# Patient Record
Sex: Female | Born: 1940 | Race: White | Hispanic: No | Smoking: Former smoker
Health system: Southern US, Community
[De-identification: ages and names within clinical notes are randomized; demographics above are authoritative.]

## PROBLEM LIST (undated history)

## (undated) DIAGNOSIS — R809 Proteinuria, unspecified: Secondary | ICD-10-CM

## (undated) DIAGNOSIS — M1990 Unspecified osteoarthritis, unspecified site: Secondary | ICD-10-CM

## (undated) DIAGNOSIS — K8020 Calculus of gallbladder without cholecystitis without obstruction: Secondary | ICD-10-CM

## (undated) DIAGNOSIS — I10 Essential (primary) hypertension: Secondary | ICD-10-CM

## (undated) DIAGNOSIS — R7989 Other specified abnormal findings of blood chemistry: Secondary | ICD-10-CM

## (undated) DIAGNOSIS — R42 Dizziness and giddiness: Secondary | ICD-10-CM

## (undated) DIAGNOSIS — E7800 Pure hypercholesterolemia, unspecified: Secondary | ICD-10-CM

## (undated) DIAGNOSIS — H9190 Unspecified hearing loss, unspecified ear: Secondary | ICD-10-CM

## (undated) DIAGNOSIS — R0600 Dyspnea, unspecified: Secondary | ICD-10-CM

## (undated) DIAGNOSIS — E039 Hypothyroidism, unspecified: Secondary | ICD-10-CM

## (undated) DIAGNOSIS — G43909 Migraine, unspecified, not intractable, without status migrainosus: Secondary | ICD-10-CM

## (undated) DIAGNOSIS — R51 Headache: Secondary | ICD-10-CM

## (undated) DIAGNOSIS — E079 Disorder of thyroid, unspecified: Secondary | ICD-10-CM

## (undated) HISTORY — DX: Migraine, unspecified, not intractable, without status migrainosus: G43.909

## (undated) HISTORY — PX: REDUCTION MAMMAPLASTY: SUR839

## (undated) HISTORY — DX: Unspecified hearing loss, unspecified ear: H91.90

## (undated) HISTORY — DX: Dizziness and giddiness: R42

## (undated) HISTORY — DX: Proteinuria, unspecified: R80.9

## (undated) HISTORY — PX: REPLACEMENT TOTAL KNEE: SUR1224

## (undated) HISTORY — DX: Calculus of gallbladder without cholecystitis without obstruction: K80.20

## (undated) HISTORY — DX: Unspecified osteoarthritis, unspecified site: M19.90

## (undated) HISTORY — PX: ABDOMINAL HYSTERECTOMY: SHX81

## (undated) HISTORY — DX: Hypothyroidism, unspecified: E03.9

## (undated) HISTORY — PX: INCISION AND DRAINAGE BREAST ABSCESS: SUR672

## (undated) HISTORY — PX: CATARACT EXTRACTION, BILATERAL: SHX1313

## (undated) HISTORY — PX: TONSILLECTOMY AND ADENOIDECTOMY: SHX28

## (undated) HISTORY — PX: BREAST REDUCTION SURGERY: SHX8

## (undated) HISTORY — DX: Headache: R51

---

## 2009-04-28 DIAGNOSIS — J9801 Acute bronchospasm: Secondary | ICD-10-CM | POA: Insufficient documentation

## 2009-04-28 DIAGNOSIS — M159 Polyosteoarthritis, unspecified: Secondary | ICD-10-CM | POA: Insufficient documentation

## 2009-04-28 DIAGNOSIS — R1314 Dysphagia, pharyngoesophageal phase: Secondary | ICD-10-CM | POA: Insufficient documentation

## 2010-08-30 DIAGNOSIS — K209 Esophagitis, unspecified without bleeding: Secondary | ICD-10-CM | POA: Insufficient documentation

## 2010-09-15 DIAGNOSIS — M109 Gout, unspecified: Secondary | ICD-10-CM | POA: Insufficient documentation

## 2017-05-28 DIAGNOSIS — R739 Hyperglycemia, unspecified: Secondary | ICD-10-CM | POA: Insufficient documentation

## 2017-05-28 DIAGNOSIS — J302 Other seasonal allergic rhinitis: Secondary | ICD-10-CM | POA: Insufficient documentation

## 2017-11-18 LAB — COLOGUARD

## 2018-07-04 ENCOUNTER — Ambulatory Visit (HOSPITAL_COMMUNITY)
Admission: EM | Admit: 2018-07-04 | Discharge: 2018-07-04 | Disposition: A | Discharge status: Home / Self Care | Payer: Medicare Other | Attending: Family Medicine | Admitting: Family Medicine

## 2018-07-04 ENCOUNTER — Encounter (HOSPITAL_COMMUNITY): Payer: Self-pay

## 2018-07-04 DIAGNOSIS — R42 Dizziness and giddiness: Secondary | ICD-10-CM

## 2018-07-04 DIAGNOSIS — I10 Essential (primary) hypertension: Secondary | ICD-10-CM

## 2018-07-04 DIAGNOSIS — E785 Hyperlipidemia, unspecified: Secondary | ICD-10-CM

## 2018-07-04 DIAGNOSIS — M069 Rheumatoid arthritis, unspecified: Secondary | ICD-10-CM

## 2018-07-04 HISTORY — DX: Pure hypercholesterolemia, unspecified: E78.00

## 2018-07-04 HISTORY — DX: Essential (primary) hypertension: I10

## 2018-07-04 HISTORY — DX: Disorder of thyroid, unspecified: E07.9

## 2018-07-04 HISTORY — DX: Rheumatoid arthritis, unspecified: M06.9

## 2018-07-04 MED ORDER — MECLIZINE HCL 12.5 MG PO TABS: 12.5000 mg | ORAL_TABLET | Freq: Three times a day (TID) | ORAL | 0 refills | Status: DC | PRN

## 2018-07-04 MED ORDER — TRAMADOL HCL 50 MG PO TABS: 50.0000 mg | ORAL_TABLET | Freq: Four times a day (QID) | ORAL | 0 refills | Status: DC | PRN

## 2018-07-04 NOTE — ED Triage Notes (Signed)
Pt presents with complaints of dizziness and left ear pain and itching x 3 weeks. Pt is alert, oriented and ambulatory.

## 2018-07-04 NOTE — ED Provider Notes (Addendum)
[facility]    CSN: [id number] Arrival date & time: 07/04/18  9518     History   Chief Complaint Chief Complaint  Patient presents with  . Dizziness  . Otalgia    HPI Alexis Shelton is a 77 y.o. female.   HPI  Pleasant 77 year old woman who was recently moved to the [place] area from [place].  She has been having dizziness and tinnitus and a pressure sensation in her left ear for about a month.  No trauma or injury.  No sinus symptoms or allergies.  She states actually is getting a bit better.  No falls.  No loss of hearing.  No nausea or vomiting.  Patient has rheumatoid arthritis.  She has enough of her blood pressure and cholesterol medicines, but is running low on tramadol.  She has been able to get in with a primary care doctor.  She takes it once  or twice a day.     Past Medical History:  Diagnosis Date  . Hypercholesteremia   . Hypertension   . Thyroid disease     Patient Active Problem List   Diagnosis Date Noted  . Rheumatoid arthritis (HCC) 07/04/2018  . Essential hypertension 07/04/2018  . Hyperlipidemia 07/04/2018    Past Surgical History:  Procedure Laterality Date  . KNEE REPAIR EXTENSOR MECHANISM      OB History   None      Home Medications    Prior to Admission medications   Medication Sig Start Date End Date Taking? Authorizing Provider  allopurinol (ZYLOPRIM) 100 MG tablet Take 100 mg by mouth daily.   Yes [provider]  amLODipine (NORVASC) 10 MG tablet Take 10 mg by mouth daily.   Yes [provider]  hydrochlorothiazide (HYDRODIURIL) 25 MG tablet Take 25 mg by mouth daily.   Yes [provider]  oxybutynin (DITROPAN) 5 MG tablet Take 5 mg by mouth 3 (three) times daily.   Yes [provider]  pravastatin (PRAVACHOL) 10 MG tablet Take 10 mg by mouth daily.   Yes [provider]  meclizine (ANTIVERT) 12.5 MG tablet Take 1 tablet (12.5 mg total) by mouth 3 (three) times daily  as needed for dizziness. 07/04/18   Eustace Moore, MD  traMADol (ULTRAM) 50 MG tablet Take 1 tablet (50 mg total) by mouth every 6 (six) hours as needed. 07/04/18   Eustace Moore, MD    Family History Family History  Problem Relation Age of Onset  . Stroke Mother   . Hyperlipidemia Mother   . Heart disease Father     Social History Social History   Tobacco Use  . Smoking status: Never Smoker  . Smokeless tobacco: Never Used  Substance Use Topics  . Alcohol use: Never    Frequency: Never  . Drug use: Never     Allergies   Patient has no known allergies.   Review of Systems Review of Systems  Constitutional: Negative for chills, fever and unexpected weight change.  HENT: Negative for ear pain and sore throat.   Eyes: Negative for pain and visual disturbance.  Respiratory: Negative for cough and shortness of breath.   Cardiovascular: Negative for chest pain and palpitations.  Gastrointestinal: Negative for abdominal pain, nausea and vomiting.  Genitourinary: Negative for dysuria and hematuria.  Musculoskeletal: Positive for arthralgias. Negative for back pain.       Chronic  Skin: Negative for color change and rash.  Neurological: Positive for dizziness. Negative for seizures,  syncope, light-headedness and headaches.  All other systems reviewed and are negative.    Physical Exam Triage Vital Signs ED Triage Vitals  Enc Vitals Group     BP 07/04/18 0906 (!) 162/66     Pulse Rate 07/04/18 0906 62     Resp 07/04/18 0906 18     Temp 07/04/18 0906 98.5 F (36.9 C)     Temp Source 07/04/18 0906 Oral     SpO2 07/04/18 0906 97 %     Weight --      Height --      Head Circumference --      Peak Flow --      Pain Score 07/04/18 0909 2     Pain Loc --      Pain Edu? --      Excl. in GC? --    Orthostatic VS for the past 24 hrs:  BP- Lying Pulse- Lying BP- Sitting Pulse- Sitting  07/04/18 0958 164/75 64 172/68 64    Updated Vital Signs BP (!) 162/66  (BP Location: Left Arm)   Pulse 62   Temp 98.5 F (36.9 C) (Oral)   Resp 18   SpO2 97%       Physical Exam  Constitutional: She appears well-developed and well-nourished. No distress.  HENT:  Head: Normocephalic and atraumatic.  Right Ear: External ear normal.  Left Ear: External ear normal.  Mouth/Throat: Oropharynx is clear and moist.  TMs are normal  Eyes: Pupils are equal, round, and reactive to light. Conjunctivae and EOM are normal.  Disks flat.  No nystagmus  Neck: Normal range of motion. No thyromegaly present.  Cardiovascular: Normal rate.  Pulmonary/Chest: Effort normal. No respiratory distress.  Abdominal: Soft. She exhibits no distension.  Musculoskeletal: Normal range of motion. She exhibits no edema.  Arthritic changes and deformity obvious at the IP joints of both hands  Lymphadenopathy:    She has no cervical adenopathy.  Neurological: She is alert.  Skin: Skin is warm and dry.  Psychiatric: She has a normal mood and affect. Her behavior is normal.     UC Treatments / Results  Labs (all labs ordered are listed, but only abnormal results are displayed) Labs Reviewed - No data to display  EKG None  Radiology No results found.  Procedures Procedures (including critical care time)  Medications Ordered in UC Medications - No data to display  Initial Impression / Assessment and Plan / UC Course  I have reviewed the triage vital signs and the nursing notes.  Pertinent labs & imaging results that were available during my care of the patient were reviewed by me and considered in my medical decision making (see chart for details).      Final Clinical Impressions(s) / UC Diagnoses   Final diagnoses:  Vertigo     Discharge Instructions     Take meclizine as needed for vertigo Take your blood pressure medications at the same time every day Drink plenty of water I have refilled 30 days of tramadol I have placed PCP referral Return as  needed    ED Prescriptions    Medication Sig Dispense Auth. Provider   traMADol (ULTRAM) 50 MG tablet Take 1 tablet (50 mg total) by mouth every 6 (six) hours as needed. 90 tablet Eustace Moore, MD   meclizine (ANTIVERT) 12.5 MG tablet Take 1 tablet (12.5 mg total) by mouth 3 (three) times daily as needed for dizziness. 30 tablet Eustace Moore, MD  Controlled Substance Prescriptions [place] Controlled Substance Registry consulted? Yes, I have consulted the [place] Controlled Substances Registry for this patient, and feel the risk/benefit ratio today is favorable for proceeding with this prescription for a controlled substance.   Eustace Moore, MD 07/04/18 1047    Eustace Moore, MD 07/04/18 1048

## 2018-07-04 NOTE — Discharge Instructions (Signed)
Take meclizine as needed for vertigo Take your blood pressure medications at the same time every day Drink plenty of water I have refilled 30 days of tramadol I have placed PCP referral Return as needed

## 2018-07-05 ENCOUNTER — Other Ambulatory Visit: Payer: Self-pay | Admitting: Family Medicine

## 2018-07-22 ENCOUNTER — Other Ambulatory Visit: Payer: Self-pay | Admitting: Otolaryngology

## 2018-07-22 ENCOUNTER — Other Ambulatory Visit (HOSPITAL_COMMUNITY): Payer: Self-pay | Admitting: Otolaryngology

## 2018-07-22 DIAGNOSIS — H903 Sensorineural hearing loss, bilateral: Secondary | ICD-10-CM

## 2018-07-30 ENCOUNTER — Ambulatory Visit (HOSPITAL_COMMUNITY): Payer: Medicare Other

## 2018-08-02 ENCOUNTER — Ambulatory Visit (HOSPITAL_COMMUNITY)
Admission: RE | Admit: 2018-08-02 | Discharge: 2018-08-02 | Disposition: A | Discharge status: Home / Self Care | Payer: Medicare Other | Source: Ambulatory Visit | Attending: Otolaryngology | Admitting: Otolaryngology

## 2018-08-02 DIAGNOSIS — R9082 White matter disease, unspecified: Secondary | ICD-10-CM | POA: Insufficient documentation

## 2018-08-02 DIAGNOSIS — H903 Sensorineural hearing loss, bilateral: Secondary | ICD-10-CM | POA: Diagnosis present

## 2018-08-02 LAB — CREATININE, SERUM
Creatinine, Ser: 1.47 mg/dL — ABNORMAL HIGH (ref 0.44–1.00)
GFR calc Af Amer: 38 mL/min — ABNORMAL LOW (ref >60)
GFR calc non Af Amer: 33 mL/min — ABNORMAL LOW (ref >60)

## 2018-08-02 MED ORDER — GADOBENATE DIMEGLUMINE 529 MG/ML IV SOLN
20.0000 mL | Freq: Once | INTRAVENOUS | Status: AC
  Administered 2018-08-02: 10 mL via INTRAVENOUS

## 2018-08-12 ENCOUNTER — Ambulatory Visit (INDEPENDENT_AMBULATORY_CARE_PROVIDER_SITE_OTHER): Payer: Medicare Other | Admitting: Family Medicine

## 2018-08-12 ENCOUNTER — Encounter (INDEPENDENT_AMBULATORY_CARE_PROVIDER_SITE_OTHER): Payer: Self-pay | Admitting: Family Medicine

## 2018-08-12 VITALS — BP 118/52 | HR 62 | Temp 97.4°F

## 2018-08-12 DIAGNOSIS — J45909 Unspecified asthma, uncomplicated: Secondary | ICD-10-CM | POA: Insufficient documentation

## 2018-08-12 DIAGNOSIS — M1009 Idiopathic gout, multiple sites: Secondary | ICD-10-CM | POA: Diagnosis not present

## 2018-08-12 DIAGNOSIS — J4520 Mild intermittent asthma, uncomplicated: Secondary | ICD-10-CM

## 2018-08-12 DIAGNOSIS — I10 Essential (primary) hypertension: Secondary | ICD-10-CM

## 2018-08-12 DIAGNOSIS — M069 Rheumatoid arthritis, unspecified: Secondary | ICD-10-CM | POA: Diagnosis not present

## 2018-08-12 DIAGNOSIS — R413 Other amnesia: Secondary | ICD-10-CM

## 2018-08-12 DIAGNOSIS — E785 Hyperlipidemia, unspecified: Secondary | ICD-10-CM | POA: Diagnosis not present

## 2018-08-12 DIAGNOSIS — N183 Chronic kidney disease, stage 3 unspecified: Secondary | ICD-10-CM

## 2018-08-12 DIAGNOSIS — R42 Dizziness and giddiness: Secondary | ICD-10-CM

## 2018-08-12 DIAGNOSIS — E039 Hypothyroidism, unspecified: Secondary | ICD-10-CM

## 2018-08-12 HISTORY — DX: Idiopathic gout, multiple sites: M10.09

## 2018-08-12 MED ORDER — MONTELUKAST SODIUM 10 MG PO TABS: 10.0000 mg | ORAL_TABLET | Freq: Every day | ORAL | 3 refills | Status: DC

## 2018-08-12 MED ORDER — ATORVASTATIN CALCIUM 10 MG PO TABS: 10.0000 mg | ORAL_TABLET | Freq: Every day | ORAL | 3 refills | Status: DC

## 2018-08-12 NOTE — Progress Notes (Signed)
Office Visit Note   Patient: Alexis Shelton           Date of Birth: [birth year]           MRN: [id number] Visit Date: 08/12/2018 Requested by: No referring provider defined for this encounter. PCP: Lavada Mesi, MD  Subjective: Chief Complaint  Patient presents with  . establish primary care - moved from [place]    HPI: She recently moved here from [place].  Unfortunately her husband passed away last winter.  She moved here to be closer to her son and grandson who is now 16 years old.  Recently she went to the ER with vertigo.  She has had a dizzy feeling for about a month.  She also notes that her memory is not as good as it used to be, all in the past month.  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  She is concerned because her mother was incapacitated by a stroke in her 2s and eventually died from complications.  She has a history of rheumatoid arthritis which has been controlled without medication.  She has made some good dietary changes which seem to have helped.  Hypertension has been controlled with medication.  No side effects.  She tolerates pravastatin for hyperlipidemia but she thinks she did better with Lipitor and would like to go back to that.  She has a history of mild intermittent asthma and is currently having some wheezing.  Patient has hypothyroidism which is well controlled with medication.  She has a history of chronic kidney disease monitored periodically by nephrology.                ROS: Other systems were reviewed and are negative.  Objective: Vital Signs: BP (!) 118/52 (BP Location: Left Arm, Patient Position: Sitting, Cuff Size: Normal)   Pulse 62   Temp (!) 97.4 F (36.3 C)   Physical Exam:  HEENT:  [place]/AT, PERRLA, EOM Full, no nystagmus.  Funduscopic examination within normal limits.  No conjunctival erythema.  Tympanic membranes are pearly gray with normal landmarks.  External ear  canals are normal.  Nasal passages are clear.  Oropharynx is clear.  No significant lymphadenopathy.  No thyromegaly or nodules.  2+ carotid pulses without bruits. CV: Regular rate and rhythm without murmurs, rubs, or gallops.  No peripheral edema.  2+ radial and posterior tibial pulses. Lungs: Overall good air movement but she has diffuse expiratory wheezing, no areas of consolidation. EXT: 2+ upper and lower DTRs.  She has nodularity of her fingers.  No active synovitis.    Imaging: None today  Assessment & Plan: 1.  Dizziness with memory change, abnormal brain MRI -Per patient request we will request neurology consultation.  We will optimize blood pressure control.  2.  Hypertension, well controlled today.  3.  Hyperlipidemia, tolerating statin.  Switch to Lipitor.  4.  History of gout, asymptomatic.  5.  Hypothyroidism, clinically euthyroid  6.  Chronic kidney disease -Recheck labs in about 3 to 4 months.  7.  Asthma with acute exacerbation -Albuterol as needed.  Start Singulair.   Follow-Up Instructions: Return in about 6 months (around 02/12/2019).     Procedures: None today.   PMFS History: Patient Active Problem List   Diagnosis Date Noted  . 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  . Asthma 08/12/2018  . Hypothyroidism 08/12/2018  . Rheumatoid arthritis (HCC) 07/04/2018  . Essential hypertension 07/04/2018  .  Hyperlipidemia 07/04/2018   Past Medical History:  Diagnosis Date  . Hypercholesteremia   . Hypertension   . Thyroid disease     Family History  Problem Relation Age of Onset  . Stroke Mother   . Hyperlipidemia Mother   . Heart disease Father     Past Surgical History:  Procedure Laterality Date  . KNEE REPAIR EXTENSOR MECHANISM     Social History   Occupational History  . Not on file  Tobacco Use  . Smoking status: Never Smoker  . Smokeless tobacco: Never Used  Substance and Sexual Activity  . Alcohol use: Never    Frequency:  Never  . Drug use: Never  . Sexual activity: Not on file

## 2018-08-14 ENCOUNTER — Other Ambulatory Visit (INDEPENDENT_AMBULATORY_CARE_PROVIDER_SITE_OTHER): Payer: Self-pay | Admitting: Family Medicine

## 2018-08-14 ENCOUNTER — Telehealth (INDEPENDENT_AMBULATORY_CARE_PROVIDER_SITE_OTHER): Payer: Self-pay | Admitting: Family Medicine

## 2018-08-14 NOTE — Telephone Encounter (Signed)
Patient left a message requesting several RX refills.  I contacted the patient to find out which ones she needed refilled.  She could only remember the allopurinol and the amlodipine.  She asked if we could call [facility] on [address] to find out the rest of the medications that she could not remember that need to be refilled.  CB#[phone] or [phone].  Thank you.

## 2018-08-14 NOTE — Telephone Encounter (Signed)
Advised the patient to call her pharmacy, letting them know she needs refills.  If she has no refills left on the medications, the pharmacy will send the information on the requested medications to [place] for [place] to refill.  She said she understood and will call her pharmacy.

## 2018-08-15 ENCOUNTER — Telehealth (INDEPENDENT_AMBULATORY_CARE_PROVIDER_SITE_OTHER): Payer: Self-pay

## 2018-08-15 NOTE — Telephone Encounter (Signed)
Patient called stating that since taking the Singulair, she has not been feeling very well.  Stated she has been feeling run down and very tired.  Would like a CB to discuss.  CB# is [phone].  Please advise.  Thank You.

## 2018-08-15 NOTE — Telephone Encounter (Signed)
Sometimes cutting dosage to 1/2 tablet daily will solve that problem.  If it persists, we'll stop it and try something else.

## 2018-08-15 NOTE — Telephone Encounter (Signed)
Advised patient

## 2018-08-15 NOTE — Telephone Encounter (Signed)
The patient has started taking the Singulair 10 mg, 1 at bedtime for 5 days now.  She says she wakes during the night feeling "uneasy" and nauseated.  It is helping her breathing, but she just feels lethargic.  Please advise.

## 2018-09-02 ENCOUNTER — Other Ambulatory Visit (INDEPENDENT_AMBULATORY_CARE_PROVIDER_SITE_OTHER): Payer: Self-pay | Admitting: Family Medicine

## 2018-09-02 MED ORDER — LOSARTAN POTASSIUM 100 MG PO TABS: 100.0000 mg | ORAL_TABLET | Freq: Every day | ORAL | 1 refills | Status: DC

## 2018-09-19 ENCOUNTER — Telehealth (INDEPENDENT_AMBULATORY_CARE_PROVIDER_SITE_OTHER): Payer: Self-pay | Admitting: Family Medicine

## 2018-09-19 NOTE — Telephone Encounter (Signed)
Patient called would like to see a Rheumatologist,Alexis Shelton is currently not feeling well. Please call patient to discuss patient care.

## 2018-09-20 ENCOUNTER — Other Ambulatory Visit (INDEPENDENT_AMBULATORY_CARE_PROVIDER_SITE_OTHER): Payer: Self-pay | Admitting: Family Medicine

## 2018-09-20 DIAGNOSIS — M069 Rheumatoid arthritis, unspecified: Secondary | ICD-10-CM

## 2018-09-20 NOTE — Telephone Encounter (Signed)
Referral orders have been placed. 

## 2018-10-13 ENCOUNTER — Other Ambulatory Visit (INDEPENDENT_AMBULATORY_CARE_PROVIDER_SITE_OTHER): Payer: Self-pay | Admitting: Family Medicine

## 2018-10-13 ENCOUNTER — Encounter: Payer: Self-pay | Admitting: Neurology

## 2018-10-13 ENCOUNTER — Ambulatory Visit (INDEPENDENT_AMBULATORY_CARE_PROVIDER_SITE_OTHER): Payer: Medicare Other | Admitting: Neurology

## 2018-10-13 VITALS — BP 138/73 | HR 61 | Ht 66.0 in | Wt 179.5 lb

## 2018-10-13 DIAGNOSIS — R269 Unspecified abnormalities of gait and mobility: Secondary | ICD-10-CM | POA: Diagnosis not present

## 2018-10-13 DIAGNOSIS — Z8673 Personal history of transient ischemic attack (TIA), and cerebral infarction without residual deficits: Secondary | ICD-10-CM

## 2018-10-13 HISTORY — DX: Personal history of transient ischemic attack (TIA), and cerebral infarction without residual deficits: Z86.73

## 2018-10-13 NOTE — Progress Notes (Signed)
PATIENT: Alexis Shelton DOB: Jan 20, 1941  Chief Complaint  Patient presents with  . Dizziness    Orthostatic Vitals: Lying: 138/73, 61, Sitting: 160/86, 62, Standing: 134/66, 67, Standing x 3 minutes: 148/70, 66.  Reports intermittent dizziness worsening since May 2019.  Symptoms not always related to positional changes.  . Headache    Reports 1-2 headaches per month with an occasional migraine.  She typically does not medicate her pain but has Tramadol, if needed.  She is unable to take NSAIDS due to her kidney function.   Marland Kitchen PCP    Hilts, Casimiro Needle, MD     HISTORICAL  Alexis Shelton is a 77 year old female, seen in request by her primary care physician Hilts, Casimiro Needle, for evaluation of imbalance, initial evaluation was on October 13, 2018.  I have reviewed and summarized the referring note from the referring physician.  She had a past medical history of hypertension bilateral knee replacement, cataract surgery, hyperlipidemia, hypothyroidism on supplement,  She had a history of chronic migraine in the past, usually triggered by stress, especially the period of time after extreme stress, she would get lateralized pounding headaches, often preceded by visual aura, she only has a few times each year, occasionally woke up with her typical migraine, she took tramadol as needed for pain,  She also had a history of rheumatoid arthritis, bilateral knee replacement, this was done at [place], left side in June 2018, right knee was February 2019, she recovered well post surgically, since May 2019, she noticed gradual onset of unsteady gait, first noticed that when she got up in the middle of the night using bathroom, she has to hold walls to steady herself.  She only noticed unsteady sensation when she bear weight, walking, denies dizziness in sitting down or lying down position, she denies vertigo,  20 years ago, she woke up from general anesthesia for her hysterectomy, she had a sudden onset left  hearing loss, has been persistent that way.  She recently moved from [place] to [place], during the past 10 years, she traveled extensively in cruise trip, she complains of seasickness sometimes, she also drinks up to 8 ounces of wine frequently with her friend.  We personally reviewed MRI of the brain August 2019, remote cerebellar infarction, mild periventricular white matter disease,  There is no orthostatic blood pressure demonstrated today, she does complains of mild bilateral toes numbness. Lying: 138/73, 61, Sitting: 160/86, 62, Standing: 134/66, 67, Standing x 3 minutes: 148/70, 66.  Reports intermittent dizziness worsening since May 2019.  Symptoms not always related to positional changes.   REVIEW OF SYSTEMS: Full 14 system review of systems performed and notable only for sleepiness, skin sensitivity, wheezing, hearing loss, ringing the ears, spinning sensation All other review of systems were negative.  ALLERGIES: Allergies  Allergen Reactions  . Other     Wood - burning fireplaces    HOME MEDICATIONS: Current Outpatient Medications  Medication Sig Dispense Refill  . ALBUTEROL SULFATE HFA IN Inhale into the lungs as needed.    Marland Kitchen allopurinol (ZYLOPRIM) 100 MG tablet TAKE 1 TABLET BY MOUTH EVERY DAY 90 tablet 0  . amLODipine (NORVASC) 10 MG tablet Take 10 mg by mouth daily.    Marland Kitchen atorvastatin (LIPITOR) 10 MG tablet Take 1 tablet (10 mg total) by mouth daily. 90 tablet 3  . hydrochlorothiazide (HYDRODIURIL) 25 MG tablet Take 25 mg by mouth daily.    Marland Kitchen levothyroxine (SYNTHROID, LEVOTHROID) 100 MCG tablet Take 100  mcg by mouth daily before breakfast.    . losartan (COZAAR) 100 MG tablet Take 1 tablet (100 mg total) by mouth daily. 90 tablet 1  . montelukast (SINGULAIR) 10 MG tablet Take 1 tablet (10 mg total) by mouth at bedtime. 90 tablet 3  . omeprazole (PRILOSEC) 20 MG capsule TAKE ONE CAPSULE BY MOUTH TWICE A DAY 90 capsule 0  . traMADol (ULTRAM) 50 MG tablet Take 1  tablet (50 mg total) by mouth every 6 (six) hours as needed. 90 tablet 0   No current facility-administered medications for this visit.     PAST MEDICAL HISTORY: Past Medical History:  Diagnosis Date  . Dizziness   . Headache   . Hearing loss   . Hypercholesteremia   . Hypertension   . Thyroid disease     PAST SURGICAL HISTORY: Past Surgical History:  Procedure Laterality Date  . ABDOMINAL HYSTERECTOMY    . CATARACT EXTRACTION, BILATERAL    . KNEE REPAIR EXTENSOR MECHANISM    . REPLACEMENT TOTAL KNEE Bilateral     FAMILY HISTORY: Family History  Problem Relation Age of Onset  . Stroke Mother   . Hyperlipidemia Mother   . Heart disease Father     SOCIAL HISTORY: Social History   Socioeconomic History  . Marital status: Single    Spouse name: Not on file  . Number of children: 1  . Years of education: MD  . Highest education level: Not on file  Occupational History  . Occupation: Retired - Sales promotion account executive  Social Needs  . Financial resource strain: Not on file  . Food insecurity:    Worry: Not on file    Inability: Not on file  . Transportation needs:    Medical: Not on file    Non-medical: Not on file  Tobacco Use  . Smoking status: Former Smoker    Last attempt to quit: 1970    Years since quitting: 49.8  . Smokeless tobacco: Never Used  Substance and Sexual Activity  . Alcohol use: Not Currently    Frequency: Never  . Drug use: Never  . Sexual activity: Not on file  Lifestyle  . Physical activity:    Days per week: Not on file    Minutes per session: Not on file  . Stress: Not on file  Relationships  . Social connections:    Talks on phone: Not on file    Gets together: Not on file    Attends religious service: Not on file    Active member of club or organization: Not on file    Attends meetings of clubs or organizations: Not on file    Relationship status: Not on file  . Intimate partner violence:    Fear of current or ex partner: Not  on file    Emotionally abused: Not on file    Physically abused: Not on file    Forced sexual activity: Not on file  Other Topics Concern  . Not on file  Social History Narrative   Lives alone.   Right-handed.   1 cup coffee per day.     PHYSICAL EXAM   Vitals:   10/13/18 1358  BP: 138/73  Pulse: 61  Weight: 179 lb 8 oz (81.4 kg)  Height: 5\' 6"  (1.676 m)    Not recorded      Body mass index is 28.97 kg/m.  PHYSICAL EXAMNIATION:  Gen: NAD, conversant, well nourised, obese, well groomed  Cardiovascular: Regular rate rhythm, no peripheral edema, warm, nontender. Eyes: Conjunctivae clear without exudates or hemorrhage Neck: Supple, no carotid bruits. Pulmonary: Clear to auscultation bilaterally   NEUROLOGICAL EXAM:  MENTAL STATUS: Speech:    Speech is normal; fluent and spontaneous with normal comprehension.  Cognition:     Orientation to time, place and person     Normal recent and remote memory     Normal Attention span and concentration     Normal Language, naming, repeating,spontaneous speech     Fund of knowledge   CRANIAL NERVES: CN II: Visual fields are full to confrontation. Fundoscopic exam is normal with sharp discs and no vascular changes. Pupils are round equal and briskly reactive to light. CN III, IV, VI: extraocular movement are normal. No ptosis. CN V: Facial sensation is intact to pinprick in all 3 divisions bilaterally. Corneal responses are intact.  CN VII: Face is symmetric with normal eye closure and smile. CN VIII: Decreased to finger rubbing on the left side CN IX, X: Palate elevates symmetrically. Phonation is normal. CN XI: Head turning and shoulder shrug are intact CN XII: Tongue is midline with normal movements and no atrophy.  MOTOR: There is no pronator drift of out-stretched arms. Muscle bulk and tone are normal. Muscle strength is normal.  REFLEXES: Reflexes are 2+ and symmetric at the biceps, triceps, absent  at knees, and trace at ankles. Plantar responses are flexor.  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  Romberg is absent.   DIAGNOSTIC DATA (LABS, IMAGING, TESTING) - I reviewed patient records, labs, notes, testing and imaging myself where available.   ASSESSMENT AND PLAN  Alexis Shelton is a 77 y.o. female   Unsteady gait  Evidence of truncal ataxia, mild dysmetria of bilateral lower extremity,  Differentiation diagnosis include brainstem/cerebellum degeneration, she did have evidence of small cerebellum stroke, reported history of moderate alcohol use in the past, with superimposed mild axonal peripheral neuropathy  Laboratory evaluations potential cause of peripheral neuropathy  EMG nerve conduction study  Referral to physical therapy  History of stroke  Complete evaluation with echocardiogram  Ultrasound of carotid artery  Increase water intake  Aspirin 81 mg daily    Levert Feinstein, M.D. Ph.D.  [facility] [address] [place], [place] [address] Ph: [phone] Fax: [phone]  CC:  Lavada Mesi, MD

## 2018-10-15 ENCOUNTER — Other Ambulatory Visit (INDEPENDENT_AMBULATORY_CARE_PROVIDER_SITE_OTHER): Payer: Self-pay | Admitting: Family Medicine

## 2018-10-15 ENCOUNTER — Telehealth (INDEPENDENT_AMBULATORY_CARE_PROVIDER_SITE_OTHER): Payer: Self-pay

## 2018-10-15 LAB — CBC WITH DIFFERENTIAL/PLATELET
Basophils Absolute: 0 10*3/uL (ref 0.0–0.2)
Basos: 1 %
EOS (ABSOLUTE): 0.3 10*3/uL (ref 0.0–0.4)
EOS: 5 %
HEMATOCRIT: 43.5 % (ref 34.0–46.6)
HEMOGLOBIN: 14 g/dL (ref 11.1–15.9)
Immature Grans (Abs): 0.1 10*3/uL (ref 0.0–0.1)
Immature Granulocytes: 1 %
Lymphocytes Absolute: 2 10*3/uL (ref 0.7–3.1)
Lymphs: 28 %
MCH: 29.7 pg (ref 26.6–33.0)
MCHC: 32.2 g/dL (ref 31.5–35.7)
MCV: 92 fL (ref 79–97)
MONOCYTES: 9 %
Monocytes Absolute: 0.6 10*3/uL (ref 0.1–0.9)
NEUTROS ABS: 3.9 10*3/uL (ref 1.4–7.0)
Neutrophils: 56 %
Platelets: 270 10*3/uL (ref 150–450)
RBC: 4.72 x10E6/uL (ref 3.77–5.28)
RDW: 14.1 % (ref 12.3–15.4)
WBC: 6.9 10*3/uL (ref 3.4–10.8)

## 2018-10-15 LAB — PROTEIN ELECTROPHORESIS
A/G RATIO SPE: 1 (ref 0.7–1.7)
Albumin ELP: 3.8 g/dL (ref 2.9–4.4)
Alpha 1: 0.2 g/dL (ref 0.0–0.4)
Alpha 2: 1.1 g/dL — ABNORMAL HIGH (ref 0.4–1.0)
BETA: 1.2 g/dL (ref 0.7–1.3)
Gamma Globulin: 1.5 g/dL (ref 0.4–1.8)
Globulin, Total: 4 g/dL — ABNORMAL HIGH (ref 2.2–3.9)
Total Protein: 7.8 g/dL (ref 6.0–8.5)

## 2018-10-15 LAB — SEDIMENTATION RATE: SED RATE: 28 mm/h (ref 0–40)

## 2018-10-15 LAB — ANA W/REFLEX IF POSITIVE: Anti Nuclear Antibody(ANA): NEGATIVE

## 2018-10-15 LAB — VITAMIN B12: Vitamin B-12: 420 pg/mL (ref 232–1245)

## 2018-10-15 LAB — HEMOGLOBIN A1C
ESTIMATED AVERAGE GLUCOSE: 128 mg/dL
Hgb A1c MFr Bld: 6.1 % — ABNORMAL HIGH (ref 4.8–5.6)

## 2018-10-15 LAB — COPPER, SERUM: Copper: 119 ug/dL (ref 72–166)

## 2018-10-15 LAB — C-REACTIVE PROTEIN: CRP: 8 mg/L (ref 0–10)

## 2018-10-15 LAB — VITAMIN D 25 HYDROXY (VIT D DEFICIENCY, FRACTURES): Vit D, 25-Hydroxy: 21.5 ng/mL — ABNORMAL LOW (ref 30.0–100.0)

## 2018-10-15 LAB — RPR: RPR: NONREACTIVE

## 2018-10-15 MED ORDER — AMLODIPINE BESYLATE 10 MG PO TABS: 10.0000 mg | ORAL_TABLET | Freq: Every day | ORAL | 3 refills | Status: DC

## 2018-10-15 MED ORDER — TRAMADOL HCL 50 MG PO TABS: 50.0000 mg | ORAL_TABLET | Freq: Four times a day (QID) | ORAL | 0 refills | Status: DC | PRN

## 2018-10-15 MED ORDER — ALLOPURINOL 100 MG PO TABS: 100.0000 mg | ORAL_TABLET | Freq: Every day | ORAL | 3 refills | Status: DC

## 2018-10-15 MED ORDER — FAMOTIDINE 10 MG PO TABS: 10.0000 mg | ORAL_TABLET | Freq: Two times a day (BID) | ORAL | 1 refills | Status: DC | PRN

## 2018-10-15 MED ORDER — ALBUTEROL SULFATE HFA 108 (90 BASE) MCG/ACT IN AERS
2.0000 | INHALATION_SPRAY | Freq: Four times a day (QID) | RESPIRATORY_TRACT | 11 refills | Status: DC | PRN
Start: 2018-10-15 — End: 2019-10-22

## 2018-10-15 MED ORDER — LEVOTHYROXINE SODIUM 100 MCG PO TABS: 100.0000 ug | ORAL_TABLET | Freq: Every day | ORAL | 1 refills | Status: DC

## 2018-10-15 NOTE — Telephone Encounter (Signed)
Rxs sent

## 2018-10-15 NOTE — Telephone Encounter (Signed)
[facility] [address] faxed a refill request on the following medications: Tramadol, Amlodipine, Levoxyl , Allopurinol, Albuterol HFA, and Pepcid.  I checked with the patient about the Pepcid because this was not on her medicine list.  She states she takes Prilosec 20 mg qam and Pepcid 10 mg qhs.  The medication list in the chart states she takes Prilosec 20 mg bid. Please advise on the refills.

## 2018-10-16 ENCOUNTER — Telehealth: Payer: Self-pay | Admitting: Neurology

## 2018-10-16 NOTE — Telephone Encounter (Signed)
Spoke to patient - she is aware of results and agreeable to start the recommended vitamin D3 supplement  She will also follow up with her PCP concerning her elevated A1C.

## 2018-10-16 NOTE — Telephone Encounter (Signed)
Please call patient, extensive laboratory evaluation showed  Elevated A1c 6.1, indicating abnormal glucose levels in the past few months,  Low vitamin D 22, she should take over-the-counter vitamin D 3 supplement 1000 units daily,  Rest of the laboratory evaluation showed no significant abnormality.  Also forwarded laboratory evaluation to her primary care physician Dr. Lavada Mesi, she should keep up with her follow-up plan, continue glucose monitoring,

## 2018-10-17 ENCOUNTER — Ambulatory Visit (INDEPENDENT_AMBULATORY_CARE_PROVIDER_SITE_OTHER): Payer: Medicare Other | Admitting: Neurology

## 2018-10-17 DIAGNOSIS — G3281 Cerebellar ataxia in diseases classified elsewhere: Secondary | ICD-10-CM | POA: Diagnosis not present

## 2018-10-17 DIAGNOSIS — R269 Unspecified abnormalities of gait and mobility: Secondary | ICD-10-CM | POA: Diagnosis not present

## 2018-10-17 DIAGNOSIS — R159 Full incontinence of feces: Secondary | ICD-10-CM

## 2018-10-17 DIAGNOSIS — Z8673 Personal history of transient ischemic attack (TIA), and cerebral infarction without residual deficits: Secondary | ICD-10-CM

## 2018-10-17 NOTE — Procedures (Signed)
Full Name: Alexis Shelton Gender: Female MRN #: [id number] Date of Birth: [birth year]    Visit Date: 10/17/18 08:20 Age: 77 Years 1 Months Old Examining Physician: Levert Feinstein, MD  Referring Physician: Terrace Arabia, MD History: 77 year old female, with history of bilateral feet paresthesia, gradual onset gait abnormality bowel and bladder incontinence.  Summary of the tests:  Nerve conduction study: Bilateral sural and the left peroneal sensory responses were absent.  Right peroneal sensory responses showed significantly decreased the snap amplitude, with borderline peak latency.  Right tibial motor responses were normal.  Left tibial motor responses showed normal distal latency, with moderately decreased the C map amplitude, low normal range conduction velocity.  Right tibial motor responses were within normal limits.  Left peroneal motor responses showed no significant abnormality.  Right peroneal to EDB motor response showed mildly decreased the C map amplitude.  Right ulnar sensory responses showed normal peak latency with mildly decreased to snap amplitude.  Right ulnar motor responses were normal.  Electromyography: Selective needle examinations were performed at right upper, lower extremity muscle, right lumbar, cervical paraspinal muscles.  Only mild chronic neuropathic changes noted at the right abductor hallucis, increased insertional activity, mild enlargement motor unit potential with mildly decreased recruitment patterns, rest of the needle examination showed no significant abnormality.   Conclusion:  This is an abnormal study.  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  There is no evidence of left lumbar radiculopathy or cervical radiculopathy.   ------------------------------- Levert Feinstein, M.D. PhD.  [facility] [address] [place], [place] [address] Tel: [phone] Fax: [phone]         [facility]    Nerve / Sites Muscle Latency Ref. Amplitude Ref. Rel Amp Segments Distance Velocity Ref. Area    ms ms mV mV %  cm m/s m/s mVms  R Ulnar - ADM     Wrist ADM 2.7 ?3.3 10.3 ?6.0 100 Wrist - ADM 7   36.2     B.Elbow ADM 6.4  9.7  94.7 B.Elbow - Wrist 18 49 ?49 32.8     A.Elbow ADM 8.4  9.6  98.7 A.Elbow - B.Elbow 10 49 ?49 31.8         A.Elbow - Wrist      R Peroneal - EDB     Ankle EDB 5.9 ?6.5 1.3 ?2.0 100 Ankle - EDB 9   5.4     Fib head EDB 12.6  1.3  102 Fib head - Ankle 28 42 ?44 4.8     Pop fossa EDB 15.2  1.2  92.4 Pop fossa - Fib head 10 38 ?44 4.4         Pop fossa - Ankle      L Peroneal - EDB     Ankle EDB 5.1 ?6.5 2.2 ?2.0 100 Ankle - EDB 9   9.6     Fib head EDB 11.7  2.0  93.2 Fib head - Ankle 28 42 ?44 9.2     Pop fossa EDB 14.2  1.9  92 Pop fossa - Fib head 10 41 ?44 7.7         Pop fossa - Ankle      R Tibial - AH     Ankle AH 4.3 ?5.8 4.6 ?4.0 100 Ankle - AH 9   13.8     Pop fossa AH 13.9  3.4  73.7 Pop fossa - Ankle 38 40 ?41 12.0  L  Tibial - AH     Ankle AH 4.6 ?5.8 2.1 ?4.0 100 Ankle - AH 9   7.3     Pop fossa AH 13.8  1.3  64.5 Pop fossa - Ankle 38 42 ?41 2.2               SNC    Nerve / Sites Rec. Site Peak Lat Ref.  Amp Ref. Segments Distance    ms ms V V  cm  R Radial - Anatomical snuff box (Forearm)     Forearm Wrist 3.1 ?2.9 13 ?15 Forearm - Wrist 10  R Sural - Ankle (Calf)     Calf Ankle NR ?4.4 NR ?6 Calf - Ankle 14  L Sural - Ankle (Calf)     Calf Ankle NR ?4.4 NR ?6 Calf - Ankle 14  R Superficial peroneal - Ankle     Lat leg Ankle 4.4 ?4.4 1 ?6 Lat leg - Ankle 14  L Superficial peroneal - Ankle     Lat leg Ankle NR ?4.4 NR ?6 Lat leg - Ankle 14  R Ulnar - Orthodromic, (Dig V, Mid palm)     Dig V Wrist 2.9 ?3.1 3 ?5 Dig V - Wrist 12                  F  Wave    Nerve F Lat Ref.   ms ms  R Tibial - AH 57.3 ?56.0  L Tibial - AH 54.1 ?56.0  R Ulnar - ADM 29.3 ?32.0           EMG full       EMG Summary Table    Spontaneous MUAP  Recruitment  Muscle IA Fib PSW Fasc Other Amp Dur. Poly Pattern  R. Tibialis anterior Normal None None None _______ Normal Normal Normal Normal  R. Tibialis posterior Normal None None None _______ Normal Normal Normal Normal  R. Gastrocnemius (Medial head) Normal None None None _______ Normal Normal Normal Normal  R. Vastus lateralis Normal None None None _______ Normal Normal Normal Normal  R. Abductor hallucis Increased None None None _______ Increased Increased Normal Reduced  R. Biceps femoris (long head) Normal None None None _______ Normal Normal Normal Normal  R. Lumbar paraspinals (mid) Normal None None None _______ Normal Normal Normal Normal  R. Lumbar paraspinals (low) Normal None None None _______ Normal Normal Normal Normal  R. First dorsal interosseous Normal None None None _______ Normal Normal Normal Normal  R. Pronator teres Normal None None None _______ Normal Normal Normal Normal  R. Biceps brachii Normal None None None _______ Normal Normal Normal Normal  R. Deltoid Normal None None None _______ Normal Normal Normal Normal  R. Extensor digitorum communis Normal None None None _______ Normal Normal Normal Normal  R. Cervical paraspinals Normal None None None _______ Normal Normal Normal Normal

## 2018-10-17 NOTE — Progress Notes (Signed)
PATIENT: Alexis Shelton DOB: 09/13/1941  Chief Complaint  Patient presents with  . Dizziness    Orthostatic Vitals: Lying: 138/73, 61, Sitting: 160/86, 62, Standing: 134/66, 67, Standing x 3 minutes: 148/70, 66.  Reports intermittent dizziness worsening since May 2019.  Symptoms not always related to positional changes.  . Headache    Reports 1-2 headaches per month with an occasional migraine.  She typically does not medicate her pain but has Tramadol, if needed.  She is unable to take NSAIDS due to her kidney function.   Marland Kitchen PCP    Hilts, Legrand Como, MD     HISTORICAL  Alexis Shelton is a 77 year old female, seen in request by her primary care physician Alexis Shelton, for evaluation of imbalance, initial evaluation was on October 13, 2018.  I have reviewed and summarized the referring note from the referring physician.  She had a past medical history of hypertension bilateral knee replacement, cataract surgery, hyperlipidemia, hypothyroidism on supplement,  She had a history of chronic migraine in the past, usually triggered by stress, especially the period of time after extreme stress, she would get lateralized pounding headaches, often preceded by visual aura, she only has a few times each year, occasionally woke up with her typical migraine, she took tramadol as needed for pain,  She also had a history of rheumatoid arthritis, bilateral knee replacement, this was done at [place], left side in June 2018, right knee was February 2019, she recovered well post surgically, since May 2019, she noticed gradual onset of unsteady gait, first noticed that when she got up in the middle of the night using bathroom, she has to hold walls to steady herself.  She only noticed unsteady sensation when she bear weight, walking, denies dizziness in sitting down or lying down position, she denies vertigo,  20 years ago, she woke up from general anesthesia for her hysterectomy, she had a sudden onset left  hearing loss, has been persistent that way.  She recently moved from [place] to [place], during the past 10 years, she traveled extensively in cruise trip, she complains of seasickness sometimes, she also drinks up to 8 ounces of wine frequently with her friend.  We personally reviewed MRI of the brain August 2019, remote cerebellar infarction, mild periventricular white matter disease,  There is no orthostatic blood pressure demonstrated today, she does complains of mild bilateral toes numbness. Lying: 138/73, 61, Sitting: 160/86, 62, Standing: 134/66, 67, Standing x 3 minutes: 148/70, 66.  Reports intermittent dizziness worsening since May 2019.  Symptoms not always related to positional changes.   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,  We reviewed the laboratory evaluation, elevated A1c 6.1, low vitamin D 21, rest of the laboratory evaluation showed normal or negative protein electrophoresis, CBC, ESR, C-reactive protein, RPR, B12, copper, ANA  Today she also reported long history of bowel and bladder incontinence, she could not feel her defecation, she denies significant neck or low back pain,  REVIEW OF SYSTEMS: Full 14 system review of systems performed and notable only for sleepiness, skin sensitivity, wheezing, hearing loss, ringing the ears, spinning sensation All other review of systems were negative.  ALLERGIES: Allergies  Allergen Reactions  . Other     Wood - burning fireplaces    HOME MEDICATIONS: Current Outpatient Medications  Medication Sig Dispense Refill  . albuterol (PROVENTIL HFA;VENTOLIN HFA) 108 (90 Base) MCG/ACT  inhaler Inhale 2 puffs into the lungs every 6 (six) hours as needed for wheezing or shortness of breath. 1 Inhaler 11  . ALBUTEROL SULFATE HFA IN Inhale into the lungs as needed.    Marland Kitchen allopurinol (ZYLOPRIM) 100 MG tablet Take 1 tablet  (100 mg total) by mouth daily. 90 tablet 3  . amLODipine (NORVASC) 10 MG tablet Take 1 tablet (10 mg total) by mouth daily. 90 tablet 3  . atorvastatin (LIPITOR) 10 MG tablet Take 1 tablet (10 mg total) by mouth daily. 90 tablet 3  . Cholecalciferol (VITAMIN D3) 1000 units CAPS Take 1,000 Units by mouth daily.    . famotidine (PEPCID AC) 10 MG tablet Take 1 tablet (10 mg total) by mouth 2 (two) times daily as needed for heartburn or indigestion. 180 tablet 1  . hydrochlorothiazide (HYDRODIURIL) 25 MG tablet Take 25 mg by mouth daily.    Marland Kitchen levothyroxine (SYNTHROID) 100 MCG tablet Take 1 tablet (100 mcg total) by mouth daily before breakfast. 90 tablet 1  . levothyroxine (SYNTHROID, LEVOTHROID) 100 MCG tablet Take 100 mcg by mouth daily before breakfast.    . losartan (COZAAR) 100 MG tablet Take 1 tablet (100 mg total) by mouth daily. 90 tablet 1  . montelukast (SINGULAIR) 10 MG tablet Take 1 tablet (10 mg total) by mouth at bedtime. 90 tablet 3  . omeprazole (PRILOSEC) 20 MG capsule TAKE ONE CAPSULE BY MOUTH TWICE A DAY 90 capsule 0  . traMADol (ULTRAM) 50 MG tablet Take 1 tablet (50 mg total) by mouth every 6 (six) hours as needed. 90 tablet 0   No current facility-administered medications for this visit.     PAST MEDICAL HISTORY: Past Medical History:  Diagnosis Date  . Dizziness   . Headache   . Hearing loss   . Hypercholesteremia   . Hypertension   . Thyroid disease     PAST SURGICAL HISTORY: Past Surgical History:  Procedure Laterality Date  . ABDOMINAL HYSTERECTOMY    . CATARACT EXTRACTION, BILATERAL    . KNEE REPAIR EXTENSOR MECHANISM    . REPLACEMENT TOTAL KNEE Bilateral     FAMILY HISTORY: Family History  Problem Relation Age of Onset  . Stroke Mother   . Hyperlipidemia Mother   . Heart disease Father     SOCIAL HISTORY: Social History   Socioeconomic History  . Marital status: Single    Spouse name: Not on file  . Number of children: 1  . Years of  education: MD  . Highest education level: Not on file  Occupational History  . Occupation: Retired - Animator  Social Needs  . Financial resource strain: Not on file  . Food insecurity:    Worry: Not on file    Inability: Not on file  . Transportation needs:    Medical: Not on file    Non-medical: Not on file  Tobacco Use  . Smoking status: Former Smoker    Last attempt to quit: 1970    Years since quitting: 49.8  . Smokeless tobacco: Never Used  Substance and Sexual Activity  . Alcohol use: Not Currently    Frequency: Never  . Drug use: Never  . Sexual activity: Not on file  Lifestyle  . Physical activity:    Days per week: Not on file    Minutes per session: Not on file  . Stress: Not on file  Relationships  . Social connections:    Talks on phone: Not on file  Gets together: Not on file    Attends religious service: Not on file    Active member of club or organization: Not on file    Attends meetings of clubs or organizations: Not on file    Relationship status: Not on file  . Intimate partner violence:    Fear of current or ex partner: Not on file    Emotionally abused: Not on file    Physically abused: Not on file    Forced sexual activity: Not on file  Other Topics Concern  . Not on file  Social History Narrative   Lives alone.   Right-handed.   1 cup coffee per day.     PHYSICAL EXAM   There were no vitals filed for this visit.  Not recorded      There is no height or weight on file to calculate BMI.  PHYSICAL EXAMNIATION:  Gen: NAD, conversant, well nourised, obese, well groomed                     Cardiovascular: Regular rate rhythm, no peripheral edema, warm, nontender. Eyes: Conjunctivae clear without exudates or hemorrhage Neck: Supple, no carotid bruits. Pulmonary: Clear to auscultation bilaterally   NEUROLOGICAL EXAM:  MENTAL STATUS: Speech:    Speech is normal; fluent and spontaneous with normal comprehension.    Cognition:     Orientation to time, place and person     Normal recent and remote memory     Normal Attention span and concentration     Normal Language, naming, repeating,spontaneous speech     Fund of knowledge   CRANIAL NERVES: CN II: Visual fields are full to confrontation. Fundoscopic exam is normal with sharp discs and no vascular changes. Pupils are round equal and briskly reactive to light. CN III, IV, VI: extraocular movement are normal. No ptosis. CN V: Facial sensation is intact to pinprick in all 3 divisions bilaterally. Corneal responses are intact.  CN VII: Face is symmetric with normal eye closure and smile. CN VIII: Decreased to finger rubbing on the left side CN IX, X: Palate elevates symmetrically. Phonation is normal. CN XI: Head turning and shoulder shrug are intact CN XII: Tongue is midline with normal movements and no atrophy.  MOTOR: There is no pronator drift of out-stretched arms. Muscle bulk and tone are normal. Muscle strength is normal.  REFLEXES: Reflexes are 2+ and symmetric at the biceps, triceps, absent at knees, and trace at ankles. Plantar responses are flexor.  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   ASSESSMENT AND PLAN  Alexis Shelton is a 77 y.o. female   Gait abnormality, incontinence  Refer her to physical therapy Peripheral neuropathy History of rheumatoid arthritis,  Need to rule out lumbosacral radiculopathy, with superimposed cervical myelopathy  MRI of cervical,  lumbar spine History of cerebellum stroke  Complete evaluation with echocardiogram  Ultrasound of carotid artery  Increase water intake  Aspirin 81 mg daily    Marcial Pacas, M.D. Ph.D.  [facility] [address] [place], [place] [address] Ph: [phone] Fax: [phone]  CC:  Hilts, Legrand Como, MD

## 2018-10-20 ENCOUNTER — Telehealth: Payer: Self-pay | Admitting: Neurology

## 2018-10-20 NOTE — Telephone Encounter (Signed)
Patient is aware of this. I have her GI phone number of [phone] and to call them if she has not heard in the next 2-3 business days.

## 2018-10-20 NOTE — Telephone Encounter (Signed)
Medicare/[employer] supp order sent to GI. They will reach out to the pt to schedule.  °

## 2018-10-23 ENCOUNTER — Ambulatory Visit (HOSPITAL_COMMUNITY)
Admission: RE | Admit: 2018-10-23 | Discharge: 2018-10-23 | Disposition: A | Discharge status: Home / Self Care | Payer: Medicare Other | Source: Ambulatory Visit | Attending: Neurology | Admitting: Neurology

## 2018-10-23 ENCOUNTER — Ambulatory Visit (HOSPITAL_BASED_OUTPATIENT_CLINIC_OR_DEPARTMENT_OTHER)
Admission: RE | Admit: 2018-10-23 | Discharge: 2018-10-23 | Disposition: A | Discharge status: Home / Self Care | Payer: Medicare Other | Source: Ambulatory Visit | Attending: Neurology | Admitting: Neurology

## 2018-10-23 DIAGNOSIS — I6523 Occlusion and stenosis of bilateral carotid arteries: Secondary | ICD-10-CM | POA: Diagnosis not present

## 2018-10-23 DIAGNOSIS — I340 Nonrheumatic mitral (valve) insufficiency: Secondary | ICD-10-CM | POA: Insufficient documentation

## 2018-10-23 DIAGNOSIS — Z8673 Personal history of transient ischemic attack (TIA), and cerebral infarction without residual deficits: Secondary | ICD-10-CM

## 2018-10-23 NOTE — Progress Notes (Signed)
Bilateral carotid duplex completed - Preliminary results - 1% to 39% ICA stenosis, vertebral arey flow is antegrade. Jeane Cashatt,RVS  10/23/2018,2:38 PM

## 2018-10-23 NOTE — Progress Notes (Signed)
  Echocardiogram 2D Echocardiogram has been performed.  Alexis Shelton 10/23/2018, 3:40 PM

## 2018-10-24 ENCOUNTER — Telehealth: Payer: Self-pay | Admitting: Neurology

## 2018-10-24 ENCOUNTER — Other Ambulatory Visit: Payer: Self-pay | Admitting: *Deleted

## 2018-10-24 NOTE — Telephone Encounter (Addendum)
Echocardiogram showed no significant abnormality, ejection fraction was 60 to 65%, wall motion was normal Ultrasound of carotid arteries showed less than 39% stenosis bilaterally, bilateral vertebral artery showed anterograde flow.   Left ventricle: The cavity size was normal. There was mild focal basal hypertrophy of the septum. Systolic function was normal. The estimated ejection fraction was in the range of 60% to 65%. Wall motion was normal; there were no regional wall motion abnormalities. Doppler parameters are consistent with abnormal left ventricular relaxation (grade 1 diastolic dysfunction). Doppler parameters are consistent with high ventricular filling pressure.  Summary: Right Carotid: Velocities in the right ICA are consistent with a 1-39% stenosis.  Left Carotid: Velocities in the left ICA are consistent with a 1-39% stenosis.  Vertebrals: Bilateral vertebral arteries demonstrate antegrade flow. Subclavians: Normal flow hemodynamics were seen in bilateral subclavian       arteries.  *See table(s) above for measurements and observations.

## 2018-10-24 NOTE — Telephone Encounter (Signed)
Spoke to patient - she is aware of the results of both tests and verbalized understanding.

## 2018-10-27 NOTE — Progress Notes (Signed)
Office Visit Note  Patient: Alexis Shelton             Date of Birth: 03/08/1941           MRN: [id number]             PCP: Lavada Mesi, MD Referring: Lavada Mesi, MD Visit Date: 11/06/2018 Occupation: retired Doctor, general practice  Subjective:  Pain in multiple joints.   History of Present Illness: Alexis Shelton is a 77 y.o. female seen in consultation per request of Dr. Prince Rome.  According to patient her symptoms started in her 33s with right toe pain and swelling.  The symptoms eventually got better by itself.  In her 43s she started having intermittent toe swelling at that time an orthopedic surgeon aspirated her toe but did not give her diagnosis.  She states she was seen by rheumatologist in [place] who diagnosed her with gout and started her on allopurinol 100 mg a day and colchicine.  She was still having flares but less frequent.  Eventually due to the cost she stopped the colchicine and stayed on allopurinol.  And she would take colchicine as needed for flares.  Later the rheumatologist added Plaquenil as well.  Diagnosis of rheumatoid arthritis was not given.  She moved to [place] in 2011 at that time the rheumatologist who took over her care gave her the diagnosis of rheumatoid arthritis and kept her on Plaquenil for few years.  The Plaquenil was eventually discontinued and 2 years ago.  She states she has not had a gout flare in the last 2 years and she has been taking allopurinol 10 mg p.o. daily.  She moved to [facility] in June 2018 at the time she had left total knee replacement in February 2019 she had right total knee replacement.  She states the knee replacements helped her mobility.  She is using a cane because of vertigo.  Recently she has been experiencing increased pain in her hands, her knee joints.  She is unable to take any NSAIDs due to low GFR.  She has been taking Tylenol which is not controlling her pain symptoms.  Activities of Daily Living:  Patient reports morning  stiffness for all day hours.   Patient Reports nocturnal pain.  Difficulty dressing/grooming: Denies Difficulty climbing stairs: Denies Difficulty getting out of chair: Denies Difficulty using hands for taps, buttons, cutlery, and/or writing: Reports  Review of Systems  Constitutional: Positive for fatigue. Negative for night sweats, weight gain and weight loss.  HENT: Positive for hearing loss. Negative for mouth sores, trouble swallowing, trouble swallowing, mouth dryness and nose dryness.   Eyes: Negative for pain, redness, visual disturbance and dryness.  Respiratory: Positive for shortness of breath. Negative for cough and difficulty breathing.        History of asthma and allergies  Cardiovascular: Negative for chest pain, palpitations, hypertension, irregular heartbeat and swelling in legs/feet.  Gastrointestinal: Negative for blood in stool, constipation and diarrhea.  Endocrine: Negative for increased urination.  Genitourinary: Negative for vaginal dryness.  Musculoskeletal: Positive for arthralgias, joint pain and morning stiffness. Negative for joint swelling, myalgias, muscle weakness, muscle tenderness and myalgias.  Skin: Negative for color change, rash, hair loss, skin tightness, ulcers and sensitivity to sunlight.  Allergic/Immunologic: Negative for susceptible to infections.  Neurological: Positive for dizziness. Negative for memory loss, night sweats and weakness.  Hematological: Negative for swollen glands.  Psychiatric/Behavioral: Positive for depressed mood. Negative for sleep disturbance. The patient is nervous/anxious.  PMFS History:  Patient Active Problem List   Diagnosis Date Noted  . History of stroke involving cerebellum 10/13/2018  . Gait abnormality 10/13/2018  . 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  . Asthma 08/12/2018  . Hypothyroidism 08/12/2018  . Rheumatoid arthritis (HCC) 07/04/2018  . Essential hypertension 07/04/2018  .  Hyperlipidemia 07/04/2018    Past Medical History:  Diagnosis Date  . Dizziness   . Headache   . Hearing loss   . Hypercholesteremia   . Hypertension   . Thyroid disease     Family History  Problem Relation Age of Onset  . Stroke Mother   . Hyperlipidemia Mother   . Arthritis Mother   . Heart disease Father   . Arthritis Sister   . Bipolar disorder Son   . Healthy Son    Past Surgical History:  Procedure Laterality Date  . ABDOMINAL HYSTERECTOMY    . CATARACT EXTRACTION, BILATERAL    . REPLACEMENT TOTAL KNEE Bilateral    Social History   Social History Narrative   Lives alone.   Right-handed.   1 cup coffee per day.    Objective: Vital Signs: BP (!) 152/60 (BP Location: Right Arm, Patient Position: Sitting, Cuff Size: Normal)   Pulse 63   Resp 16   Ht 5\' 6"  (1.676 m)   Wt 175 lb (79.4 kg) Comment: per patient, refused to weigh  BMI 28.25 kg/m    Physical Exam  Constitutional: She is oriented to person, place, and time. She appears well-developed and well-nourished.  HENT:  Head: Normocephalic and atraumatic.  Eyes: Conjunctivae and EOM are normal.  Neck: Normal range of motion.  Cardiovascular: Normal rate, regular rhythm, normal heart sounds and intact distal pulses.  Pulmonary/Chest: Effort normal and breath sounds normal.  Abdominal: Soft. Bowel sounds are normal.  Lymphadenopathy:    She has no cervical adenopathy.  Neurological: She is alert and oriented to person, place, and time.  Skin: Skin is warm and dry. Capillary refill takes less than 2 seconds.  Psychiatric: She has a normal mood and affect. Her behavior is normal.  Nursing note and vitals reviewed.    Musculoskeletal Exam: C-spine thoracic lumbar spine good range of motion.  Shoulder joints elbow joints wrist joints with good range of motion.  She had bilateral CMC PIP and DIP thickening with no synovitis.  Hip joints were in good range of motion.  She has bilateral total knee replacement.   She has DIP thickening in her feet but no synovitis.  CDAI Exam: CDAI Score: Not documented Patient Global Assessment: Not documented; Provider Global Assessment: Not documented Swollen: Not documented; Tender: Not documented Joint Exam   Not documented   There is currently no information documented on the homunculus. Go to the Rheumatology activity and complete the homunculus joint exam.  Investigation: Findings:  10/13/18: ANA negative, Copper 119, Vitamin B12 420, RPR negative, CRP 8, sed rate 28, Vitamin D 21.5  Component     Latest Ref Rng & Units 10/13/2018  Total Protein     6.0 - 8.5 g/dL 7.8  Albumin ELP     2.9 - 4.4 g/dL 3.8  Alpha 1     0.0 - 0.4 g/dL 0.2  Alpha 2     0.4 - 1.0 g/dL 1.1 (H)  Beta     0.7 - 1.3 g/dL 1.2  Gamma Globulin     0.4 - 1.8 g/dL 1.5  M-SPIKE, %     Not Observed g/dL Not Observed  Globulin, Total     2.2 - 3.9 g/dL 4.0 (H)  A/G Ratio     0.7 - 1.7 1.0  Please Note:      Comment  Anti Nuclear Antibody(ANA)     Negative Negative  Copper     72 - 166 ug/dL 657  Vitamin X03     833 - 1,245 pg/mL 420  RPR     Non Reactive Non Reactive  CRP     0 - 10 mg/L 8  Sed Rate     0 - 40 mm/hr 28   Imaging: Mr Cervical Spine Wo Contrast  Result Date: 10/30/2018  [facility] [address] [place], [place] [address] [phone] NEUROIMAGING REPORT STUDY DATE: 10/30/2018 PATIENT NAME: DOLLIE BRESSI DOB: [birth year] MRN: [id number] ORDERING CLINICIAN: Dr Terrace Arabia CLINICAL HISTORY:  8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 In addition 4 mm axial slices from C2-3 down to T1-2 level were included with T2 and gradient echo views. CONTRAST: none IMAGING SITE: [facility] FINDINGS: The cervical vertebrae demonstrate  abnormal alignment with posterior subluxation of C3-C5 over C2 vertebrae.  There is abnormal marrow signal characteristics with endplate degenerative changes and marrow signal abnormalities at C4-5 prominent osteophytes at C4-5, C5-6 and C6-7 interspaces with loss of disc height. C2-3 shows mild disc signal abnormality and facet hypertrophy but no significant compression.  C3-4 shows diffuse dense degenerative change and bulge along with ligamentum flavum and facet hypertrophy resulting in mild canal and bilateral foraminal narrowing.  C4-5 shows prominent disc and endplate marrow degenerative changes with diffuse disc bulge and ligamentum flavum and facet hypertrophy resulting in mild canal as well as bilateral foraminal narrowing also. C5-6 shows similar but milder changes.  C6-7 also shows mild disc bulge as well as ligamentum flavum and facet hypertrophy with no major compression.  The spinal cord parenchyma itself shows no abnormal signal characteristics.  Visualized portion of the lower brainstem, cerebellum, craniovertebral junction appear unremarkable.  Upper thoracic spine also show mild degenerative changes.  Paraspinal soft tissues show no significant abnormalities.   Abnormal MRI scan of cervical spine showing prominent spondylitic changes throughout most noticeable at  C4-5 and C 5-6 where there is broad-based disc osteophyte protrusion resulting in mild canal and bilateral foraminal narrowing. INTERPRETING PHYSICIAN: Delia Heady, MD Certified in  Neuroimaging by American Society of Neuroimaging and [place] [employer]specialities   Mr Lumbar Spine Wo Contrast  Result Date: 10/30/2018  [facility] [address] [place], [place] [address] [phone] NEUROIMAGING REPORT STUDY DATE: 10/30/2018 PATIENT NAME: JENEAN ESCANDON DOB: 16-Jul-1941 MRN: [id number] ORDERING CLINICIAN: Dr Terrace Arabia CLINICAL HISTORY: 77 year old lady being evaluated for gait ataxia COMPARISON  FILMS: None EXAM: MRI lumbar spine without contrast TECHNIQUE: MRI of the lumbar spine was obtained utilizing 4 mm sagittal slices from T11-12 down to the lower sacrum with T1, T2 and inversion recovery views. In addition 4 mm axial slices from L1-2 down to L5-S1 level were included with T1 and T2 weighted views. CONTRAST: None IMAGING SITE: [facility] FINDINGS: The lumbar vertebrae demonstrate abnormal alignment with scoliosis to the right and posterior subluxation of T12 and L1 over L2.  It appears to be because of anterior body height of T12 likely from old compression  fracture.  There are prominent disc degenerative changes noted throughout as well as anterior and posterior osteophytes.  There are prominent endplate marrow degenerative changes at T12-L1, L1-L2 and most prominently at L2-3.  There are also prominent anterior and posterior osteophytes noted throughout.  At T12-L1 there is loss of disc height and prominent lateral osteophytes along with facet hypertrophy which results in mild canal narrowing but no significant compression.  At L1-L2 there is also significant loss of disc height with prominent endplate marrow degenerative change and bilateral left greater than right osteophyte protrusions resulting in moderate left-sided foraminal narrowing.  At L2-3 also there is loss of disc height with prominent endplate marrow degenerative change and lateral osteophytes left greater than right along with facet hypertrophy resulting in moderate left-sided and mild right-sided foraminal and mild posterior canal narrowing.  L3-4 also shows scoliosis to the left and similar disc degenerative change and left lateral osteophyte protrusion resulting in mild left-sided foraminal narrowing.  There is mild facet hypertrophy.  L4-5 also shows scoliosis to the left and lateral osteophyte protrusion to the left resulting in severe left-sided and moderate right-sided foraminal narrowing.  At L5 there is a prominent  hemangioma involving the body anteriorly into the left.  There is disc degenerative change with disc osteophyte protrusion asymmetrically to the left resulting in severe left and mild right-sided foraminal narrowing.  The conus medullaris terminates at L1.  The visualized paraspinal soft tissue appear unremarkable   Abnormal MRI scan lumbar spine showing prominent spondylitic and disc degenerative changes noted throughout most prominent at   L5-S1 where there is left-sided severe foraminal narrowing and likely involvement of the exiting nerve root. INTERPRETING PHYSICIAN: Delia Heady, MD Certified in  Neuroimaging by American Society of Neuroimaging and [place] [employer]specialities   Xr Foot 2 Views Left  Result Date: 11/06/2018 First MTP narrowing PIP and DIP narrowing was noted.  Cystic changes were noted in the first MTP joint.  No intertarsal joint space narrowing was noted.  Dorsal spurring was noted. 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  Xr Foot 2 Views Right  Result Date: 11/06/2018 First MTP, all PIP and DIP joint space narrowing was noted.  Few cystic changes were noted in the first MTP joint.  Possible erosion was noted in the fifth MTP joint.  No intertarsal joint space narrowing was noted. Impression: These findings are consistent with osteoarthritis and gouty arthropathy.  Xr Hand 2 View Left  Result Date: 11/06/2018 Severe DIP joint space narrowing was noted.  PIP narrowing was noted.  No significant MCP changes were noted.  Severe CMC joint narrowing and subluxation was noted.  No intercarpal radiocarpal joint space narrowing was noted.  Some cystic changes were noted in the carpal and radius. Impression: These findings are consistent with osteoarthritis and inflammatory arthritis most likely gouty arthropathy.  Xr Hand 2 View Right  Result Date: 11/06/2018 Severe PIP and DIP joint space narrowing was  noted.  Subluxation of the third PIP joint was noted.  No significant MCP joint narrowing was noted.  Severe CMC joint space narrowing and subluxation was noted.  Cystic changes were noted in the radius and ulna bone.  Few cystic changes were noted in the carpal bones.  Possible chondrocalcinosis was noted in the wrist joint. 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  Vas US Carotid  Result Date: 10/23/2018 Carotid Arterial Duplex Study Indications: History of stroke. Performing Technologist: [place]  [facility]  Examination Guidelines: A complete evaluation includes B-mode imaging, spectral Doppler, color Doppler, and power Doppler as needed of all accessible portions of each vessel. Bilateral testing is considered an integral part of a complete examination. Limited examinations for reoccurring indications may be performed as noted.  Right Carotid Findings: +----------+--------+--------+--------+------------+-----------------------+           PSV cm/sEDV cm/sStenosisDescribe    Comments                +----------+--------+--------+--------+------------+-----------------------+ CCA Prox  80      2                           mild intimal thickening +----------+--------+--------+--------+------------+-----------------------+ CCA Distal94      15                                                  +----------+--------+--------+--------+------------+-----------------------+ ICA Prox  70      15              heterogenousmild plaque             +----------+--------+--------+--------+------------+-----------------------+ ICA Mid   79      23                                                  +----------+--------+--------+--------+------------+-----------------------+ ICA Distal72      16                                                  +----------+--------+--------+--------+------------+-----------------------+ ECA       90       7               heterogenousmild plaque             +----------+--------+--------+--------+------------+-----------------------+ +----------+--------+-------+--------+-------------------+           PSV cm/sEDV cmsDescribeArm Pressure (mmHG) +----------+--------+-------+--------+-------------------+ [id number]                                        +----------+--------+-------+--------+-------------------+ +---------+--------+--+--------+--+ VertebralPSV cm/s40EDV cm/s10 +---------+--------+--+--------+--+  Left Carotid Findings: +----------+--------+--------+--------+------------+-----------------------+           PSV cm/sEDV cm/sStenosisDescribe    Comments                +----------+--------+--------+--------+------------+-----------------------+ CCA Prox  116     15                                                  +----------+--------+--------+--------+------------+-----------------------+ CCA Distal132     18                          mild intimal thickening +----------+--------+--------+--------+------------+-----------------------+ ICA Prox  115     22  heterogenousminimal plaque tourous  +----------+--------+--------+--------+------------+-----------------------+ ICA Mid   71      17                          tortuous                +----------+--------+--------+--------+------------+-----------------------+ ICA Distal76      20                          tortuous                +----------+--------+--------+--------+------------+-----------------------+ ECA       87      2               heterogenousminimal plaque          +----------+--------+--------+--------+------------+-----------------------+ +----------+--------+--------+--------+-------------------+ SubclavianPSV cm/sEDV cm/sDescribeArm Pressure (mmHG) +----------+--------+--------+--------+-------------------+           140     2                                    +----------+--------+--------+--------+-------------------+ +---------+--------+--+--------+-+ VertebralPSV cm/s41EDV cm/s8 +---------+--------+--+--------+-+ High bifurcation  Summary: Right Carotid: Velocities in the right ICA are consistent with a 1-39% stenosis. Left Carotid: Velocities in the left ICA are consistent with a 1-39% stenosis. Vertebrals:  Bilateral vertebral arteries demonstrate antegrade flow. Subclavians: Normal flow hemodynamics were seen in bilateral subclavian              arteries. *See table(s) above for measurements and observations.  Electronically signed by Sherald Hess MD on 10/23/2018 at 6:16:39 PM.    Final     Recent Labs: Lab Results  Component Value Date   WBC 6.9 10/13/2018   HGB 14.0 10/13/2018   PLT 270 10/13/2018   CREATININE 1.47 (H) 08/02/2018   PROT 7.8 10/13/2018   GFRAA 38 (L) 08/02/2018    Speciality Comments: No specialty comments available.  Procedures:  No procedures performed Allergies: Other   Assessment / Plan:     Visit Diagnoses: Idiopathic chronic gout of multiple sites without tophus -patient reports that she has been on allopurinol 100 mg p.o. daily for many years.  She used to have frequent flares but she has not had a flare in the last 2 years.  She uses colchicine only on PRN basis.  I will check uric acid today.  Plan: Urinalysis, Routine w reflex microscopic  Pain in both hands -patient was treated for rheumatoid arthritis by her previous rheumatologist with Plaquenil for few years.  She has been off Plaquenil for 2 years.  I do not see any synovitis on examination.  I will obtain following x-rays and labs to evaluate.  Plan: XR Hand 2 View Right, XR Hand 2 View Left, x-rays were consistent with severe osteoarthritis and most likely gouty arthropathy with cystic changes.  Rheumatoid factor, Cyclic citrul peptide antibody, IgG, Uric acid, 14-3-3 eta Protein  History of bilateral total knee replacement.-Patient  continues to have discomfort in her bilateral knee joints.  No warmth swelling or effusion was noted.  Pain in both feet -patient had no synovitis on examination but she continues to have discomfort.  Plan: XR Foot 2 Views Right, XR Foot 2 Views Left x-rays were consistent with osteoarthritis and most likely gouty arthropathy due to cystic changes.  Essential hypertension  History of hypothyroidism  History of hyperlipidemia  History  of stroke involving cerebellum  History of hearing loss - And vertigo  History of asthma  Vitamin D deficiency-patient had low vitamin D level.  Medication management - Plan: COMPLETE METABOLIC PANEL WITH GFR   Orders: Orders Placed This Encounter  Procedures  . XR Hand 2 View Right  . XR Hand 2 View Left  . XR Foot 2 Views Right  . XR Foot 2 Views Left  . COMPLETE METABOLIC PANEL WITH GFR  . Urinalysis, Routine w reflex microscopic  . Rheumatoid factor  . Cyclic citrul peptide antibody, IgG  . Uric acid  . 14-3-3 eta Protein   No orders of the defined types were placed in this encounter.   Face-to-face time spent with patient was 50 minutes. Greater than 50% of time was spent in counseling and coordination of care.  Follow-Up Instructions: Return for Gout, osteoarthritis.   Pollyann Savoy, MD  Note - This record has been created using Animal nutritionist.  Chart creation errors have been sought, but may not always  have been located. Such creation errors do not reflect on  the standard of medical care.

## 2018-10-30 ENCOUNTER — Ambulatory Visit
Admission: RE | Admit: 2018-10-30 | Discharge: 2018-10-30 | Disposition: A | Discharge status: Home / Self Care | Payer: Medicare Other | Source: Ambulatory Visit | Attending: Neurology | Admitting: Neurology

## 2018-10-30 DIAGNOSIS — G3281 Cerebellar ataxia in diseases classified elsewhere: Secondary | ICD-10-CM | POA: Diagnosis not present

## 2018-10-31 ENCOUNTER — Telehealth: Payer: Self-pay | Admitting: Neurology

## 2018-10-31 NOTE — Telephone Encounter (Signed)
Please call patient, MRI of the cervical spine showed prominence of the changes, there is mild bilateral foraminal and canal stenosis, no evidence of cervical cord compression  MRI of lumbar spine showed prominent spondylitic changes, most prominent at L5-S1, there is left-sided severe foraminal narrowing  I will review films her at next follow up in Feb 2020  IMPRESSION: Abnormal MRI scan of cervical spine showing prominent spondylitic changes throughout most noticeable at  C4-5 and C 5-6 where there is broad-based disc osteophyte protrusion resulting in mild canal and bilateral foraminal narrowing.  IMPRESSION: Abnormal MRI scan lumbar spine showing prominent spondylitic and disc degenerative changes noted throughout most prominent at   L5-S1 where there is left-sided severe foraminal narrowing and likely involvement of the exiting nerve root.

## 2018-10-31 NOTE — Telephone Encounter (Signed)
Called, LVM for pt to call about results. Advised office currently closed now but asked she call back Monday to go over results

## 2018-11-03 NOTE — Telephone Encounter (Signed)
Spoke to patient - she is aware of her results and will keep her follow up for further review.

## 2018-11-06 ENCOUNTER — Ambulatory Visit (INDEPENDENT_AMBULATORY_CARE_PROVIDER_SITE_OTHER): Payer: Medicare Other

## 2018-11-06 ENCOUNTER — Ambulatory Visit (INDEPENDENT_AMBULATORY_CARE_PROVIDER_SITE_OTHER): Payer: Medicare Other | Admitting: Rheumatology

## 2018-11-06 ENCOUNTER — Encounter: Payer: Self-pay | Admitting: Rheumatology

## 2018-11-06 ENCOUNTER — Encounter (INDEPENDENT_AMBULATORY_CARE_PROVIDER_SITE_OTHER): Payer: Self-pay

## 2018-11-06 ENCOUNTER — Ambulatory Visit (INDEPENDENT_AMBULATORY_CARE_PROVIDER_SITE_OTHER): Payer: Self-pay

## 2018-11-06 VITALS — BP 152/60 | HR 63 | Resp 16 | Ht 66.0 in | Wt 175.0 lb

## 2018-11-06 DIAGNOSIS — Z8673 Personal history of transient ischemic attack (TIA), and cerebral infarction without residual deficits: Secondary | ICD-10-CM

## 2018-11-06 DIAGNOSIS — M79672 Pain in left foot: Secondary | ICD-10-CM | POA: Diagnosis not present

## 2018-11-06 DIAGNOSIS — Z79899 Other long term (current) drug therapy: Secondary | ICD-10-CM

## 2018-11-06 DIAGNOSIS — Z96653 Presence of artificial knee joint, bilateral: Secondary | ICD-10-CM | POA: Diagnosis not present

## 2018-11-06 DIAGNOSIS — M79642 Pain in left hand: Secondary | ICD-10-CM

## 2018-11-06 DIAGNOSIS — M79671 Pain in right foot: Secondary | ICD-10-CM

## 2018-11-06 DIAGNOSIS — M79641 Pain in right hand: Secondary | ICD-10-CM

## 2018-11-06 DIAGNOSIS — M11231 Other chondrocalcinosis, right wrist: Secondary | ICD-10-CM | POA: Diagnosis not present

## 2018-11-06 DIAGNOSIS — M1A09X0 Idiopathic chronic gout, multiple sites, without tophus (tophi): Secondary | ICD-10-CM | POA: Diagnosis not present

## 2018-11-06 DIAGNOSIS — Z8669 Personal history of other diseases of the nervous system and sense organs: Secondary | ICD-10-CM

## 2018-11-06 DIAGNOSIS — I10 Essential (primary) hypertension: Secondary | ICD-10-CM

## 2018-11-06 DIAGNOSIS — Z8709 Personal history of other diseases of the respiratory system: Secondary | ICD-10-CM

## 2018-11-06 DIAGNOSIS — E559 Vitamin D deficiency, unspecified: Secondary | ICD-10-CM

## 2018-11-06 DIAGNOSIS — Z8639 Personal history of other endocrine, nutritional and metabolic disease: Secondary | ICD-10-CM

## 2018-11-11 ENCOUNTER — Telehealth (INDEPENDENT_AMBULATORY_CARE_PROVIDER_SITE_OTHER): Payer: Self-pay | Admitting: Family Medicine

## 2018-11-11 LAB — COMPLETE METABOLIC PANEL WITH GFR
AG RATIO: 1.4 (calc) (ref 1.0–2.5)
ALBUMIN MSPROF: 4.2 g/dL (ref 3.6–5.1)
ALT: 13 U/L (ref 6–29)
AST: 15 U/L (ref 10–35)
Alkaline phosphatase (APISO): 56 U/L (ref 33–130)
BILIRUBIN TOTAL: 0.3 mg/dL (ref 0.2–1.2)
BUN/Creatinine Ratio: 22 (calc) (ref 6–22)
BUN: 30 mg/dL — AB (ref 7–25)
CHLORIDE: 106 mmol/L (ref 98–110)
CO2: 27 mmol/L (ref 20–32)
Calcium: 9.2 mg/dL (ref 8.6–10.4)
Creat: 1.35 mg/dL — ABNORMAL HIGH (ref 0.60–0.93)
GFR, EST AFRICAN AMERICAN: 43 mL/min/{1.73_m2} — AB (ref >=60)
GFR, Est Non African American: 37 mL/min/{1.73_m2} — ABNORMAL LOW (ref >=60)
GLOBULIN: 3 g/dL (ref 1.9–3.7)
GLUCOSE: 86 mg/dL (ref 65–99)
Potassium: 4.5 mmol/L (ref 3.5–5.3)
Sodium: 141 mmol/L (ref 135–146)
TOTAL PROTEIN: 7.2 g/dL (ref 6.1–8.1)

## 2018-11-11 LAB — URINALYSIS, ROUTINE W REFLEX MICROSCOPIC
BILIRUBIN URINE: NEGATIVE
GLUCOSE, UA: NEGATIVE
Hgb urine dipstick: NEGATIVE
Hyaline Cast: NONE SEEN /LPF
KETONES UR: NEGATIVE
Nitrite: NEGATIVE
Protein, ur: NEGATIVE
RBC / HPF: NONE SEEN /HPF (ref 0–2)
Specific Gravity, Urine: 1.01 (ref 1.001–1.03)
Squamous Epithelial / LPF: NONE SEEN /HPF (ref <=5)

## 2018-11-11 LAB — RHEUMATOID FACTOR: Rhuematoid fact SerPl-aCnc: 14 IU/mL — ABNORMAL HIGH (ref <14)

## 2018-11-11 LAB — CYCLIC CITRUL PEPTIDE ANTIBODY, IGG

## 2018-11-11 LAB — 14-3-3 ETA PROTEIN: 14-3-3 eta Protein: <0.2 ng/mL (ref <0.2)

## 2018-11-11 LAB — URIC ACID: Uric Acid, Serum: 6.5 mg/dL (ref 2.5–7.0)

## 2018-11-11 NOTE — Telephone Encounter (Signed)
Pt called requesting authorization for her rx to be sent/ faxed for mail order. The fax number is [phone]--[facility] name.

## 2018-11-11 NOTE — Telephone Encounter (Signed)
I spoke with the patient:  She said her new insurance will require 90-day prescriptions to be sent to [facility] in the future (she will still use a local pharmacy for urgent meds - listed in chart already). Added this pharmacy to her preferred pharmacy list per her request - she does not currently need any refills.

## 2018-11-12 NOTE — Progress Notes (Signed)
Creatinine is elevated but is stable.  Please forward labs to her PCP.  We will discuss results at follow-up visit.

## 2018-11-19 NOTE — Progress Notes (Signed)
Office Visit Note  Patient: Alexis Shelton             Date of Birth: 1941/12/04           MRN: [id number]             PCP: Lavada Mesi, MD Referring: Lavada Mesi, MD Visit Date: 12/03/2018 Occupation: @GUAROCC @  Subjective:  Left wrist pain   History of Present Illness: Alexis Shelton is a 77 y.o. female with history of gout, osteoarthritis, and DDD. She is on allopurinol 100 mg by mouth daily.  She has not had any recent gout flares.  She states she woke up this morning with left wrist pain. She denies any injury.  She denies any wrist joint swelling.  She states her pain is typically worse in the morning and in the middle of the night.  She has chronic lower back pain.  She has left SI joint pain.  She takes tramadol for pain relief. She has occasional neck stiffness and pain.  She has pain in both hands at night as well as stiffness.  She has noticed decreased grip strength.    Activities of Daily Living:   Patient reports morning stiffness all day.   Patient Denies nocturnal pain.  Difficulty dressing/grooming: Denies Difficulty climbing stairs: Reports Difficulty getting out of chair: Denies Difficulty using hands for taps, buttons, cutlery, and/or writing: Reports  Review of Systems  Constitutional: Positive for fatigue.  HENT: Positive for mouth dryness. Negative for mouth sores, trouble swallowing, trouble swallowing and nose dryness.   Eyes: Positive for dryness. Negative for pain, redness, itching and visual disturbance.  Respiratory: Negative for cough, hemoptysis, shortness of breath, wheezing and difficulty breathing.   Cardiovascular: Negative for chest pain, palpitations, hypertension and swelling in legs/feet.  Gastrointestinal: Negative for abdominal pain, blood in stool, constipation and diarrhea.  Endocrine: Negative for increased urination.  Genitourinary: Negative for painful urination and nocturia.  Musculoskeletal: Positive for arthralgias, joint pain,  joint swelling and morning stiffness. Negative for myalgias, muscle weakness, muscle tenderness and myalgias.  Skin: Negative for color change, pallor, rash, hair loss, nodules/bumps, redness, skin tightness, ulcers and sensitivity to sunlight.  Allergic/Immunologic: Negative for susceptible to infections.  Neurological: Negative for light-headedness, numbness and weakness.  Hematological: Negative for bruising/bleeding tendency and swollen glands.  Psychiatric/Behavioral: Negative for depressed mood, confusion and sleep disturbance. The patient is not nervous/anxious.     PMFS History:  Patient Active Problem List   Diagnosis Date Noted  . Primary osteoarthritis of both hands 11/24/2018  . Primary osteoarthritis of both feet 11/24/2018  . DDD (degenerative disc disease), cervical 11/24/2018  . DDD (degenerative disc disease), lumbar 11/24/2018  . History of stroke involving cerebellum 10/13/2018  . Gait abnormality 10/13/2018  . 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  . Asthma 08/12/2018  . Hypothyroidism 08/12/2018  . Rheumatoid arthritis (HCC) 07/04/2018  . Essential hypertension 07/04/2018  . Hyperlipidemia 07/04/2018    Past Medical History:  Diagnosis Date  . Dizziness   . Headache   . Hearing loss   . Hypercholesteremia   . Hypertension   . Thyroid disease     Family History  Problem Relation Age of Onset  . Stroke Mother   . Hyperlipidemia Mother   . Arthritis Mother   . Heart disease Father   . Arthritis Sister   . Bipolar disorder Son   . Healthy Son    Past Surgical History:  Procedure Laterality Date  . ABDOMINAL  HYSTERECTOMY    . CATARACT EXTRACTION, BILATERAL    . REPLACEMENT TOTAL KNEE Bilateral    Social History   Social History Narrative   Lives alone.   Right-handed.   1 cup coffee per day.    Objective: Vital Signs: BP 130/68 (BP Location: Left Arm, Patient Position: Sitting, Cuff Size: Normal)   Pulse 64   Resp 16   Ht 5' 6.5"  (1.689 m)   Wt 177 lb (80.3 kg) Comment: per patient, refused to weigh  BMI 28.14 kg/m    Physical Exam  Constitutional: She is oriented to person, place, and time. She appears well-developed and well-nourished.  HENT:  Head: Normocephalic and atraumatic.  Eyes: Conjunctivae and EOM are normal.  Neck: Normal range of motion.  Cardiovascular: Normal rate, regular rhythm, normal heart sounds and intact distal pulses.  Pulmonary/Chest: Effort normal and breath sounds normal.  Abdominal: Soft. Bowel sounds are normal.  Lymphadenopathy:    She has no cervical adenopathy.  Neurological: She is alert and oriented to person, place, and time.  Skin: Skin is warm and dry. Capillary refill takes less than 2 seconds.  Psychiatric: She has a normal mood and affect. Her behavior is normal.  Nursing note and vitals reviewed.    Musculoskeletal Exam: C-spine, thoracic, and lumbar spine good ROM.  No midline spinal tenderness.  Left SI joint tenderness.  No right SI joint tenderness.  Shoulder joints, elbow joints, wrist joints, MCPs, PIPs, and DIPs good ROM with no synovitis.  PIP and DIP synovial thickening consistent with osteoarthritis of both hands.  Hip joints, knee joints, ankle joints, MTPs, PIPs,and DIPs good ROM with no synovitis.  No warmth or effusion of knee replacements.  No tenderness or swelling of ankle joints.   CDAI Exam: CDAI Score: Not documented Patient Global Assessment: Not documented; Provider Global Assessment: Not documented Swollen: Not documented; Tender: Not documented Joint Exam   Not documented   There is currently no information documented on the homunculus. Go to the Rheumatology activity and complete the homunculus joint exam.  Investigation: No additional findings.  Imaging: Xr Foot 2 Views Left  Result Date: 11/06/2018 First MTP narrowing PIP and DIP narrowing was noted.  Cystic changes were noted in the first MTP joint.  No intertarsal joint space  narrowing was noted.  Dorsal spurring was noted. 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  Xr Foot 2 Views Right  Result Date: 11/06/2018 First MTP, all PIP and DIP joint space narrowing was noted.  Few cystic changes were noted in the first MTP joint.  Possible erosion was noted in the fifth MTP joint.  No intertarsal joint space narrowing was noted. Impression: These findings are consistent with osteoarthritis and gouty arthropathy.  Xr Hand 2 View Left  Result Date: 11/06/2018 Severe DIP joint space narrowing was noted.  PIP narrowing was noted.  No significant MCP changes were noted.  Severe CMC joint narrowing and subluxation was noted.  No intercarpal radiocarpal joint space narrowing was noted.  Some cystic changes were noted in the carpal and radius. Impression: These findings are consistent with osteoarthritis and inflammatory arthritis most likely gouty arthropathy.  Xr Hand 2 View Right  Result Date: 11/06/2018 Severe PIP and DIP joint space narrowing was noted.  Subluxation of the third PIP joint was noted.  No significant MCP joint narrowing was noted.  Severe CMC joint space narrowing and subluxation was noted.  Cystic changes were noted in the radius and  ulna bone.  Few cystic changes were noted in the carpal bones.  Possible chondrocalcinosis was noted in the wrist joint. 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   Recent Labs: Lab Results  Component Value Date   WBC 6.9 10/13/2018   HGB 14.0 10/13/2018   PLT 270 10/13/2018   NA 141 11/06/2018   K 4.5 11/06/2018   CL 106 11/06/2018   CO2 27 11/06/2018   GLUCOSE 86 11/06/2018   BUN 30 (H) 11/06/2018   CREATININE 1.35 (H) 11/06/2018   BILITOT 0.3 11/06/2018   AST 15 11/06/2018   ALT 13 11/06/2018   PROT 7.2 11/06/2018   CALCIUM 9.2 11/06/2018   GFRAA 43 (L) 11/06/2018  UA showed trace leukocytes and few bacteria,  RF 14, anti-CCP negative, uric acid 6.5, 14 3 3  eta negative  Speciality Comments: No specialty comments available.  Procedures:  Sacroiliac Joint Inj on 12/03/2018 3:39 PM Indications: pain Details: 27 G 1.5 in needle, posterior approach Medications: 1 mL lidocaine 1 %; 40 mg triamcinolone acetonide 40 MG/ML Aspirate: 0 mL Outcome: tolerated well, no immediate complications Procedure, treatment alternatives, risks and benefits explained, specific risks discussed. Consent was given by the patient. Immediately prior to procedure a time out was called to verify the correct patient, procedure, equipment, support staff and site/side marked as required. Patient was prepped and draped in the usual sterile fashion.     Allergies: Other   Assessment / Plan:     Visit Diagnoses: Idiopathic chronic gout of multiple sites without tophus: She has not had any recent gout flares.  She continues to take allopurinol 100 mg by mouth daily.  She does not need any refills at this time.  Her uric acid level was 6.5 on 11/06/2018.  She was advised to notify 11/08/2018 if she develops a gout flare.  She will follow up in 6 months.   Primary osteoarthritis of both hands - Patient was treated for possible rheumatoid arthritis in the past with Plaquenil for a few years.  RF 14, anti-CCP negative, 14 3 3  eta negative.  She has no synovitis on exam.  She has severe PIP and DIP synovial thickening consistent with osteoarthritis of bilateral hands.  She has bilateral CMC joint synovial thickening.  She is complete fist formation bilaterally.  She experiences pain and stiffness in bilateral hands first thing in the morning.  Joint protection and muscle strengthening were discussed  Primary osteoarthritis of both feet: She has osteoarthritic changes in both feet.  She has no discomfort in her feet at this time.  She wears proper fitting shoes.   DDD (degenerative disc disease), cervical: She has good ROM on exam.  She has no  symptoms of radiculopathy at this time.    DDD (degenerative disc disease), lumbar: No midline spinal tenderness.   Status post total bilateral knee replacement: Doing well.  No warmth or effusion of knee joints.  Good ROM with no discomfort.    Chronic left SI joint pain: She has left SI joint tenderness on exam today.  She requested a left SI joint cortisone injection.  She tolerated the procedure well.  Procedure note was completed above. She was given a handout of information and exercises to perform at home.   Other medical conditions are listed as follows:   Essential hypertension  Vitamin D deficiency  Dyslipidemia  History of hypothyroidism  History of stroke involving cerebellum  History of hearing loss  Vertigo  History of  asthma   Orders: Orders Placed This Encounter  Procedures  . Sacroiliac Joint Inj   No orders of the defined types were placed in this encounter.   Face-to-face time spent with patient was 30 minutes. Greater than 50% of time was spent in counseling and coordination of care.  Follow-Up Instructions: Return in about 6 months (around 06/04/2019) for Gout, Osteoarthritis, DDD.   Gearldine Bienenstock, PA-C   I examined and evaluated the patient with Sherron Ales PA.  Detailed discussion regarding the lab work with the patient.  She had no synovitis on examination.  We will continue to monitor closely.  Her symptoms are related to osteoarthritis on examination today.  If she develops any swelling she will contact us.  Her gout appears to be well controlled.  Although her uric acid is not in the desirable range.  We will monitor for right now.  The plan of care was discussed as noted above.  Pollyann Savoy, MD Note - This record has been created using Animal nutritionist.  Chart creation errors have been sought, but may not always  have been located. Such creation errors do not reflect on  the standard of medical care.

## 2018-11-24 DIAGNOSIS — M19071 Primary osteoarthritis, right ankle and foot: Secondary | ICD-10-CM

## 2018-11-24 DIAGNOSIS — M19041 Primary osteoarthritis, right hand: Secondary | ICD-10-CM | POA: Insufficient documentation

## 2018-11-24 DIAGNOSIS — M5136 Other intervertebral disc degeneration, lumbar region: Secondary | ICD-10-CM | POA: Insufficient documentation

## 2018-11-24 DIAGNOSIS — M19042 Primary osteoarthritis, left hand: Secondary | ICD-10-CM

## 2018-11-24 DIAGNOSIS — M19072 Primary osteoarthritis, left ankle and foot: Secondary | ICD-10-CM

## 2018-11-24 DIAGNOSIS — M5030 Other cervical disc degeneration, unspecified cervical region: Secondary | ICD-10-CM | POA: Insufficient documentation

## 2018-11-24 DIAGNOSIS — M51369 Other intervertebral disc degeneration, lumbar region without mention of lumbar back pain or lower extremity pain: Secondary | ICD-10-CM | POA: Insufficient documentation

## 2018-11-24 HISTORY — DX: Primary osteoarthritis, right ankle and foot: M19.071

## 2018-11-24 HISTORY — DX: Other intervertebral disc degeneration, lumbar region: M51.36

## 2018-11-24 HISTORY — DX: Primary osteoarthritis, right ankle and foot: M19.072

## 2018-11-24 HISTORY — DX: Other intervertebral disc degeneration, lumbar region without mention of lumbar back pain or lower extremity pain: M51.369

## 2018-11-29 ENCOUNTER — Other Ambulatory Visit (INDEPENDENT_AMBULATORY_CARE_PROVIDER_SITE_OTHER): Payer: Self-pay | Admitting: Family Medicine

## 2018-12-03 ENCOUNTER — Encounter: Payer: Self-pay | Admitting: Rheumatology

## 2018-12-03 ENCOUNTER — Ambulatory Visit (INDEPENDENT_AMBULATORY_CARE_PROVIDER_SITE_OTHER): Payer: Medicare Other | Admitting: Rheumatology

## 2018-12-03 VITALS — BP 130/68 | HR 64 | Resp 16 | Ht 66.5 in | Wt 177.0 lb

## 2018-12-03 DIAGNOSIS — M19041 Primary osteoarthritis, right hand: Secondary | ICD-10-CM | POA: Diagnosis not present

## 2018-12-03 DIAGNOSIS — E785 Hyperlipidemia, unspecified: Secondary | ICD-10-CM

## 2018-12-03 DIAGNOSIS — M5030 Other cervical disc degeneration, unspecified cervical region: Secondary | ICD-10-CM

## 2018-12-03 DIAGNOSIS — Z8673 Personal history of transient ischemic attack (TIA), and cerebral infarction without residual deficits: Secondary | ICD-10-CM

## 2018-12-03 DIAGNOSIS — Z96653 Presence of artificial knee joint, bilateral: Secondary | ICD-10-CM

## 2018-12-03 DIAGNOSIS — M5136 Other intervertebral disc degeneration, lumbar region: Secondary | ICD-10-CM

## 2018-12-03 DIAGNOSIS — Z8669 Personal history of other diseases of the nervous system and sense organs: Secondary | ICD-10-CM

## 2018-12-03 DIAGNOSIS — M533 Sacrococcygeal disorders, not elsewhere classified: Secondary | ICD-10-CM

## 2018-12-03 DIAGNOSIS — M19072 Primary osteoarthritis, left ankle and foot: Secondary | ICD-10-CM

## 2018-12-03 DIAGNOSIS — G8929 Other chronic pain: Secondary | ICD-10-CM

## 2018-12-03 DIAGNOSIS — Z8639 Personal history of other endocrine, nutritional and metabolic disease: Secondary | ICD-10-CM

## 2018-12-03 DIAGNOSIS — M1A09X0 Idiopathic chronic gout, multiple sites, without tophus (tophi): Secondary | ICD-10-CM | POA: Diagnosis not present

## 2018-12-03 DIAGNOSIS — I10 Essential (primary) hypertension: Secondary | ICD-10-CM

## 2018-12-03 DIAGNOSIS — Z8709 Personal history of other diseases of the respiratory system: Secondary | ICD-10-CM

## 2018-12-03 DIAGNOSIS — E559 Vitamin D deficiency, unspecified: Secondary | ICD-10-CM

## 2018-12-03 DIAGNOSIS — M19071 Primary osteoarthritis, right ankle and foot: Secondary | ICD-10-CM

## 2018-12-03 DIAGNOSIS — R42 Dizziness and giddiness: Secondary | ICD-10-CM

## 2018-12-03 DIAGNOSIS — M19042 Primary osteoarthritis, left hand: Secondary | ICD-10-CM

## 2018-12-03 MED ORDER — TRIAMCINOLONE ACETONIDE 40 MG/ML IJ SUSP
40.0000 mg | INTRAMUSCULAR | Status: AC | PRN
  Administered 2018-12-03: 40 mg via INTRA_ARTICULAR

## 2018-12-03 MED ORDER — LIDOCAINE HCL 1 % IJ SOLN
1.0000 mL | INTRAMUSCULAR | Status: AC | PRN
  Administered 2018-12-03: 1 mL

## 2018-12-03 NOTE — Patient Instructions (Signed)
Sacroiliac Joint Dysfunction Sacroiliac joint dysfunction is a condition that causes inflammation on one or both sides of the sacroiliac (SI) joint. The SI joint connects the lower part of the spine (sacrum) with the two upper portions of the pelvis (ilium). This condition causes deep aching or burning pain in the low back. In some cases, the pain may also spread into one or both buttocks or hips or spread down the legs. What are the causes? This condition may be caused by:  Pregnancy. During pregnancy, extra stress is put on the SI joints because the pelvis widens.  Injury, such as: ? Car accidents. ? Sport-related injuries. ? Work-related injuries.  Having one leg that is shorter than the other.  Conditions that affect the joints, such as: ? Rheumatoid arthritis. ? Gout. ? Psoriatic arthritis. ? Joint infection (septic arthritis).  Sometimes, the cause of SI joint dysfunction is not known. What are the signs or symptoms? Symptoms of this condition include:  Aching or burning pain in the lower back. The pain may also spread to other areas, such as: ? Buttocks. ? Groin. ? Thighs and legs.  Muscle spasms in or around the painful areas.  Increased pain when standing, walking, running, stair climbing, bending, or lifting.  How is this diagnosed? Your health care provider will do a physical exam and take your medical history. During the exam, the health care provider may move one or both of your legs to different positions to check for pain. Various tests may be done to help verify the diagnosis, including:  Imaging tests to look for other causes of pain. These may include: ? MRI. ? CT scan. ? Bone scan.  Diagnostic injection. A numbing medicine is injected into the SI joint using a needle. If the pain is temporarily improved or stopped after the injection, this can indicate that SI joint dysfunction is the problem.  How is this treated? Treatment may vary depending on the  cause and severity of your condition. Treatment options may include:  Applying ice or heat to the lower back area. This can help to reduce pain and muscle spasms.  Medicines to relieve pain or inflammation or to relax the muscles.  Wearing a back brace (sacroiliac brace) to help support the joint while your back is healing.  Physical therapy to increase muscle strength around the joint and flexibility at the joint. This may also involve learning proper body positions and ways of moving to relieve stress on the joint.  Direct manipulation of the SI joint.  Injections of steroid medicine into the joint in order to reduce pain and swelling.  Radiofrequency ablation to burn away nerves that are carrying pain messages from the joint.  Use of a device that provides electrical stimulation in order to reduce pain at the joint.  Surgery to put in screws and plates that limit or prevent joint motion. This is rare.  Follow these instructions at home:  Rest as needed. Limit your activities as directed by your health care provider.  Take medicines only as directed by your health care provider.  If directed, apply ice to the affected area: ? Put ice in a plastic bag. ? Place a towel between your skin and the bag. ? Leave the ice on for 20 minutes, 2-3 times per day.  Use a heating pad or a moist heat pack as directed by your health care provider.  Exercise as directed by your health care provider or physical therapist.  Keep all follow-up visits   as directed by your health care provider. This is important. Contact a health care provider if:  Your pain is not controlled with medicine.  You have a fever.  You have increasingly severe pain. Get help right away if:  You have weakness, numbness, or tingling in your legs or feet.  You lose control of your bladder or bowel. This information is not intended to replace advice given to you by your health care provider. Make sure you discuss  any questions you have with your health care provider. Document Released: 03/08/2009 Document Revised: 05/17/2016 Document Reviewed: 08/17/2014 [employer] Interactive Patient Education  2018 [employer]  

## 2018-12-23 ENCOUNTER — Other Ambulatory Visit (INDEPENDENT_AMBULATORY_CARE_PROVIDER_SITE_OTHER): Payer: Self-pay | Admitting: Family Medicine

## 2018-12-23 MED ORDER — BUDESONIDE-FORMOTEROL FUMARATE 160-4.5 MCG/ACT IN AERO: 2.0000 | INHALATION_SPRAY | Freq: Two times a day (BID) | RESPIRATORY_TRACT | 3 refills | Status: DC

## 2018-12-28 ENCOUNTER — Other Ambulatory Visit (INDEPENDENT_AMBULATORY_CARE_PROVIDER_SITE_OTHER): Payer: Self-pay | Admitting: Family Medicine

## 2019-01-08 ENCOUNTER — Other Ambulatory Visit (INDEPENDENT_AMBULATORY_CARE_PROVIDER_SITE_OTHER): Payer: Self-pay | Admitting: Family Medicine

## 2019-01-26 ENCOUNTER — Encounter: Payer: Self-pay | Admitting: Neurology

## 2019-01-26 ENCOUNTER — Ambulatory Visit (INDEPENDENT_AMBULATORY_CARE_PROVIDER_SITE_OTHER): Payer: Medicare Other | Admitting: Neurology

## 2019-01-26 VITALS — BP 137/71 | HR 60 | Ht 66.5 in

## 2019-01-26 DIAGNOSIS — R269 Unspecified abnormalities of gait and mobility: Secondary | ICD-10-CM | POA: Diagnosis not present

## 2019-01-26 DIAGNOSIS — Z8673 Personal history of transient ischemic attack (TIA), and cerebral infarction without residual deficits: Secondary | ICD-10-CM | POA: Diagnosis not present

## 2019-01-26 NOTE — Progress Notes (Signed)
PATIENT: Alexis Shelton DOB: Apr 12, 1941  Chief Complaint  Patient presents with  . Dizziness    Orthostatic Vitals: Lying: 138/73, 61, Sitting: 160/86, 62, Standing: 134/66, 67, Standing x 3 minutes: 148/70, 66.  Reports intermittent dizziness worsening since May 2019.  Symptoms not always related to positional changes.  . Headache    Reports 1-2 headaches per month with an occasional migraine.  She typically does not medicate her pain but has Tramadol, if needed.  She is unable to take NSAIDS due to her kidney function.   Marland Kitchen PCP    Hilts, Legrand Como, MD     HISTORICAL  Alexis Shelton is a 78 year old female, seen in request by her primary care physician Hilts, Legrand Como, for evaluation of imbalance, initial evaluation was on October 13, 2018.  I have reviewed and summarized the referring note from the referring physician.  She had a past medical history of hypertension bilateral knee replacement, cataract surgery, hyperlipidemia, hypothyroidism on supplement,  She had a history of chronic migraine in the past, usually triggered by stress, especially the period of time after extreme stress, she would get lateralized pounding headaches, often preceded by visual aura, she only has a few times each year, occasionally woke up with her typical migraine, she took tramadol as needed for pain,  She also had a history of rheumatoid arthritis, bilateral knee replacement, this was done at [place], left side in June 2018, right knee was February 2019, she recovered well post surgically, since May 2019, she noticed gradual onset of unsteady gait, first noticed that when she got up in the middle of the night using bathroom, she has to hold walls to steady herself.  She only noticed unsteady sensation when she bear weight, walking, denies dizziness in sitting down or lying down position, she denies vertigo,  20 years ago, she woke up from general anesthesia for her hysterectomy, she had a sudden onset left  hearing loss, has been persistent that way.  She recently moved from [place] to [place], during the past 10 years, she traveled extensively in cruise trip, she complains of seasickness sometimes, she also drinks up to 8 ounces of wine frequently with her friend.  We personally reviewed MRI of the brain August 2019, remote cerebellar infarction, mild periventricular white matter disease,  There is no orthostatic blood pressure demonstrated today, she does complains of mild bilateral toes numbness. Lying: 138/73, 61, Sitting: 160/86, 62, Standing: 134/66, 67, Standing x 3 minutes: 148/70, 66.  Reports intermittent dizziness worsening since May 2019.  Symptoms not always related to positional changes.   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,  We reviewed the laboratory evaluation, elevated A1c 6.1, low vitamin D 21, rest of the laboratory evaluation showed normal or negative protein electrophoresis, CBC, ESR, C-reactive protein, RPR, B12, copper, ANA  Today she also reported long history of bowel and bladder incontinence, she could not feel her defecation, she denies significant neck or low back pain,  UPDATE Jan 26 2019: She continue has mild unsteady gait, also complains of difficulty sleeping, often woke up in the morning time felt nausea,  We personally reviewed MRI of the brain August 2019, no cerebellar infarction, no periventricular small vessel disease, superior cerebellum atrophy MRI of cervical spine November 2019, multilevel degenerative changes, no significant canal or foraminal narrowing MRI of lumbar: Multilevel degenerative changes, most prominent at L5-S1, left side  severe foraminal narrowing,  EMG nerve conduction study October 2019 showed mild axonal sensorimotor polyneuropathy,   REVIEW OF SYSTEMS: Full 14 system review of systems performed and notable only  for fatigue, hearing loss, ringing in ears, wheezing, abdominal pain, nauseous, frequent awakening, muscle cramps, dizziness, anxiety All other review of systems were negative.  ALLERGIES: Allergies  Allergen Reactions  . Other     Wood - burning fireplaces    HOME MEDICATIONS: Current Outpatient Medications  Medication Sig Dispense Refill  . albuterol (PROVENTIL HFA;VENTOLIN HFA) 108 (90 Base) MCG/ACT inhaler Inhale 2 puffs into the lungs every 6 (six) hours as needed for wheezing or shortness of breath. 1 Inhaler 11  . allopurinol (ZYLOPRIM) 100 MG tablet Take 1 tablet (100 mg total) by mouth daily. 90 tablet 3  . amLODipine (NORVASC) 10 MG tablet Take 1 tablet (10 mg total) by mouth daily. 90 tablet 3  . aspirin 81 MG EC tablet Take 81 mg by mouth daily. Swallow whole.    Marland Kitchen atorvastatin (LIPITOR) 10 MG tablet Take 1 tablet (10 mg total) by mouth daily. 90 tablet 3  . budesonide-formoterol (SYMBICORT) 160-4.5 MCG/ACT inhaler Inhale 2 puffs into the lungs 2 (two) times daily. 3 Inhaler 3  . furosemide (LASIX) 20 MG tablet Take 20 mg by mouth daily.    Marland Kitchen levothyroxine (SYNTHROID) 100 MCG tablet Take 1 tablet (100 mcg total) by mouth daily before breakfast. 90 tablet 1  . losartan (COZAAR) 100 MG tablet TAKE 1 TABLET BY MOUTH EVERY DAY 90 tablet 1  . montelukast (SINGULAIR) 10 MG tablet Take 1 tablet (10 mg total) by mouth at bedtime. 90 tablet 3  . omeprazole (PRILOSEC) 20 MG capsule TAKE ONE CAPSULE BY MOUTH TWICE A DAY 90 capsule 0  . traMADol (ULTRAM) 50 MG tablet TAKE 1 TABLET BY MOUTH EVERY 6 HOURS AS NEEDED 90 tablet 0   No current facility-administered medications for this visit.     PAST MEDICAL HISTORY: Past Medical History:  Diagnosis Date  . Dizziness   . Headache   . Hearing loss   . Hypercholesteremia   . Hypertension   . Thyroid disease     PAST SURGICAL HISTORY: Past Surgical History:  Procedure Laterality Date  . ABDOMINAL HYSTERECTOMY    . CATARACT  EXTRACTION, BILATERAL    . REPLACEMENT TOTAL KNEE Bilateral     FAMILY HISTORY: Family History  Problem Relation Age of Onset  . Stroke Mother   . Hyperlipidemia Mother   . Arthritis Mother   . Heart disease Father   . Arthritis Sister   . Bipolar disorder Son   . Healthy Son     SOCIAL HISTORY: Social History   Socioeconomic History  . Marital status: Widowed    Spouse name: Not on file  . Number of children: 1  . Years of education: MD  . Highest education level: Not on file  Occupational History  . Occupation: Retired - Animator  Social Needs  . Financial resource strain: Not on file  . Food insecurity:    Worry: Not on file    Inability: Not on file  . Transportation needs:    Medical: Not on file    Non-medical: Not on file  Tobacco Use  . Smoking status: Former Smoker    Last attempt to quit: 1970    Years since quitting: 50.1  . Smokeless tobacco: Never Used  Substance and Sexual Activity  . Alcohol use: Not Currently    Frequency:  Never  . Drug use: Never  . Sexual activity: Not on file  Lifestyle  . Physical activity:    Days per week: Not on file    Minutes per session: Not on file  . Stress: Not on file  Relationships  . Social connections:    Talks on phone: Not on file    Gets together: Not on file    Attends religious service: Not on file    Active member of club or organization: Not on file    Attends meetings of clubs or organizations: Not on file    Relationship status: Not on file  . Intimate partner violence:    Fear of current or ex partner: Not on file    Emotionally abused: Not on file    Physically abused: Not on file    Forced sexual activity: Not on file  Other Topics Concern  . Not on file  Social History Narrative   Lives alone.   Right-handed.   1 cup coffee per day.     PHYSICAL EXAM   Vitals:   01/26/19 1134  BP: 137/71  Pulse: 60  Height: 5' 6.5" (1.689 m)    Not recorded      Body mass index  is 28.14 kg/m.  PHYSICAL EXAMNIATION:  Gen: NAD, conversant, well nourised, obese, well groomed                     Cardiovascular: Regular rate rhythm, no peripheral edema, warm, nontender. Eyes: Conjunctivae clear without exudates or hemorrhage Neck: Supple, no carotid bruits. Pulmonary: Clear to auscultation bilaterally   NEUROLOGICAL EXAM:  MENTAL STATUS: Speech:    Speech is normal; fluent and spontaneous with normal comprehension.  Cognition:     Orientation to time, place and person     Normal recent and remote memory     Normal Attention span and concentration     Normal Language, naming, repeating,spontaneous speech     Fund of knowledge   CRANIAL NERVES: CN II: Visual fields are full to confrontation.  Pupils are round equal and briskly reactive to light. CN III, IV, VI: extraocular movement are normal. No ptosis. CN V: Facial sensation is intact to pinprick in all 3 divisions bilaterally. Corneal responses are intact.  CN VII: Face is symmetric with normal eye closure and smile. CN VIII: Decreased to finger rubbing on the left side CN IX, X: Palate elevates symmetrically. Phonation is normal. CN XI: Head turning and shoulder shrug are intact CN XII: Tongue is midline with normal movements and no atrophy.  MOTOR: There is no pronator drift of out-stretched arms. Muscle bulk and tone are normal. Muscle strength is normal.  REFLEXES: Reflexes are 2+ and symmetric at the biceps, triceps, absent at knees, and trace at ankles. Plantar responses are flexor.  SENSORY: Length dependent decreased to light touch, pinprick and vibratory sensation at bilateral toes  COORDINATION: She has mild to moderate truncal ataxia, there is no significant upper extremity dysmetria, mild heel-to-shin dysmetria bilaterally  GAIT/STANCE: She needs pushed up to get up from seated position, wide-based, unsteady ataxic gait Romberg is mildly positive   DIAGNOSTIC DATA (LABS, IMAGING,  TESTING) - I reviewed patient records, labs, notes, testing and imaging myself where available.   ASSESSMENT AND PLAN  Alexis Shelton is a 78 y.o. 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 Ph.D.  [facility] [address], [place], [place] [address] Ph: [phone] Fax: [phone]  CC:  Eunice Blase, MD

## 2019-01-26 NOTE — Patient Instructions (Signed)
[facility]   [address]  Open ? Closes 10PM  [phone]  [facility]   [address]  Open ? Closes 10PM  [phone]

## 2019-02-11 ENCOUNTER — Encounter (INDEPENDENT_AMBULATORY_CARE_PROVIDER_SITE_OTHER): Payer: Self-pay | Admitting: Family Medicine

## 2019-02-11 ENCOUNTER — Ambulatory Visit (INDEPENDENT_AMBULATORY_CARE_PROVIDER_SITE_OTHER): Payer: Medicare Other | Admitting: Family Medicine

## 2019-02-11 VITALS — BP 158/65 | HR 59 | Temp 98.6°F | Resp 20

## 2019-02-11 DIAGNOSIS — E559 Vitamin D deficiency, unspecified: Secondary | ICD-10-CM

## 2019-02-11 DIAGNOSIS — N1832 Chronic kidney disease, stage 3b: Secondary | ICD-10-CM | POA: Insufficient documentation

## 2019-02-11 DIAGNOSIS — M25561 Pain in right knee: Secondary | ICD-10-CM

## 2019-02-11 DIAGNOSIS — E785 Hyperlipidemia, unspecified: Secondary | ICD-10-CM

## 2019-02-11 DIAGNOSIS — E039 Hypothyroidism, unspecified: Secondary | ICD-10-CM | POA: Diagnosis not present

## 2019-02-11 DIAGNOSIS — R252 Cramp and spasm: Secondary | ICD-10-CM

## 2019-02-11 DIAGNOSIS — M25562 Pain in left knee: Secondary | ICD-10-CM

## 2019-02-11 DIAGNOSIS — J4520 Mild intermittent asthma, uncomplicated: Secondary | ICD-10-CM | POA: Diagnosis not present

## 2019-02-11 DIAGNOSIS — I10 Essential (primary) hypertension: Secondary | ICD-10-CM | POA: Diagnosis not present

## 2019-02-11 DIAGNOSIS — G8929 Other chronic pain: Secondary | ICD-10-CM

## 2019-02-11 DIAGNOSIS — N183 Chronic kidney disease, stage 3 unspecified: Secondary | ICD-10-CM

## 2019-02-11 DIAGNOSIS — M1009 Idiopathic gout, multiple sites: Secondary | ICD-10-CM

## 2019-02-11 HISTORY — DX: Chronic kidney disease, stage 3 unspecified: N18.30

## 2019-02-11 HISTORY — DX: Vitamin D deficiency, unspecified: E55.9

## 2019-02-11 HISTORY — DX: Mild intermittent asthma, uncomplicated: J45.20

## 2019-02-11 NOTE — Patient Instructions (Signed)
   Magnesium:  400 mg daily  Glucosamine sulfate:  1,000 mg twice daily

## 2019-02-11 NOTE — Progress Notes (Signed)
Office Visit Note   Patient: Alexis Shelton           Date of Birth: [birth year]           MRN: [id number] Visit Date: 02/11/2019 Requested by: Lavada Mesi, MD [address] [place], [place] [address] PCP: Lavada Mesi, MD  Subjective: Chief Complaint  Patient presents with  . 6 months f/u BP/Meds    HPI: She is here for monitoring of medical conditions.  Blood pressure has been pretty well controlled with her usual regimen.  Asthma is much better with Singulair.  She is not needing her inhaler very often.  She is clinically euthyroid.  Gout is stable.  She does complain of intermittent knee pain both sides, as well as cramps in her calves.               ROS: Otherwise noncontributory  Objective: Vital Signs: BP (!) 158/65 (BP Location: Left Arm, Patient Position: Sitting, Cuff Size: Normal)   Pulse (!) 59   Temp 98.6 F (37 C)   Resp 20   Physical Exam:  HEENT:  [place]/AT, PERRLA, EOM Full, no nystagmus.  Funduscopic examination within normal limits.  No conjunctival erythema.  Tympanic membranes are pearly gray with normal landmarks.  External ear canals are normal.  Nasal passages are clear.  Oropharynx is clear.  No significant lymphadenopathy.  No thyromegaly or nodules.  2+ carotid pulses without bruits. CV: Regular rate and rhythm without murmurs, rubs, or gallops.  No peripheral edema.  2+ radial and posterior tibial pulses. Lungs: Clear to auscultation throughout with no wheezing or areas of consolidation. Knees: 1+ effusion on the right, trace on the left.  No warmth or erythema.  Good range of motion, slight tenderness on the medial joint lines.    Imaging: None today.  Assessment & Plan: 1.  Hypertension, has been adequately controlled. -Refills when needed, follow-up in 6 months.  2.  Hyperlipidemia, tolerating medication.  3.  Asthma, well controlled.  4.  Hypothyroidism, stable.  5.  Stage III chronic kidney disease -Labs in about 4 to 6  months.  6.  Vitamin D deficiency -Taking supplement.  Recheck in 4 to 6 months.  7.  Bilateral knee pain probably due to arthritis -Trial of glucosamine  8.  Muscle cramps -Trial of magnesium.  9.  Back pain - Start PT     Procedures: No procedures performed  No notes on file     PMFS History: Patient Active Problem List   Diagnosis Date Noted  . Vitamin D deficiency 02/11/2019  . Stage 3 chronic kidney disease (HCC) 02/11/2019  . Mild intermittent asthma without complication 02/11/2019  . Primary osteoarthritis of both hands 11/24/2018  . Primary osteoarthritis of both feet 11/24/2018  . DDD (degenerative disc disease), cervical 11/24/2018  . DDD (degenerative disc disease), lumbar 11/24/2018  . History of stroke involving cerebellum 10/13/2018  . Gait abnormality 10/13/2018  . 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  . Asthma 08/12/2018  . Hypothyroidism 08/12/2018  . Rheumatoid arthritis (HCC) 07/04/2018  . Essential hypertension 07/04/2018  . Hyperlipidemia 07/04/2018   Past Medical History:  Diagnosis Date  . Dizziness   . Headache   . Hearing loss   . Hypercholesteremia   . Hypertension   . Thyroid disease     Family History  Problem Relation Age of Onset  . Stroke Mother   . Hyperlipidemia Mother   . Arthritis Mother   . Heart disease Father   .  Arthritis Sister   . Bipolar disorder Son   . Healthy Son     Past Surgical History:  Procedure Laterality Date  . ABDOMINAL HYSTERECTOMY    . CATARACT EXTRACTION, BILATERAL    . REPLACEMENT TOTAL KNEE Bilateral    Social History   Occupational History  . Occupation: Retired - Sales promotion account executive  Tobacco Use  . Smoking status: Former Smoker    Last attempt to quit: 1970    Years since quitting: 50.1  . Smokeless tobacco: Never Used  Substance and Sexual Activity  . Alcohol use: Not Currently    Frequency: Never  . Drug use: Never  . Sexual activity: Not on file

## 2019-04-06 ENCOUNTER — Other Ambulatory Visit (INDEPENDENT_AMBULATORY_CARE_PROVIDER_SITE_OTHER): Payer: Self-pay | Admitting: Family Medicine

## 2019-04-06 MED ORDER — TRAMADOL HCL 50 MG PO TABS: 50.0000 mg | ORAL_TABLET | Freq: Four times a day (QID) | ORAL | 0 refills | Status: DC | PRN

## 2019-04-14 ENCOUNTER — Other Ambulatory Visit (INDEPENDENT_AMBULATORY_CARE_PROVIDER_SITE_OTHER): Payer: Self-pay | Admitting: Family Medicine

## 2019-04-24 ENCOUNTER — Encounter (INDEPENDENT_AMBULATORY_CARE_PROVIDER_SITE_OTHER): Payer: Self-pay | Admitting: Family Medicine

## 2019-04-24 ENCOUNTER — Telehealth: Payer: Self-pay

## 2019-04-24 ENCOUNTER — Other Ambulatory Visit (INDEPENDENT_AMBULATORY_CARE_PROVIDER_SITE_OTHER): Payer: Self-pay | Admitting: Family Medicine

## 2019-04-24 DIAGNOSIS — R1033 Periumbilical pain: Secondary | ICD-10-CM

## 2019-04-24 NOTE — Telephone Encounter (Signed)
Patient called and states she is having Abdominal pain(mainly in the middle), nauseous, constipated, no sleep, decrease of appetite, and warm- has not taking temp. Pain has been going on for months (off & on). Would like to know what she can do or does she need to come in? Please call patient. Thank you.   CB [phone]

## 2019-04-24 NOTE — Progress Notes (Signed)
Patient called stating that she has had progressively worsening abdominal pain over the past few months.  Pain is to the left of midline and near the umbilicus.  Before moving here she was told that she had gallstones.  She plans to have those records sent to me.  Pain is now constant but she is not having vomiting or diarrhea, no fevers or chills.  We will refer her to Dr. Magnus Ivan for evaluation, possible cholecystectomy.

## 2019-05-02 ENCOUNTER — Other Ambulatory Visit (INDEPENDENT_AMBULATORY_CARE_PROVIDER_SITE_OTHER): Payer: Self-pay | Admitting: Family Medicine

## 2019-05-13 ENCOUNTER — Other Ambulatory Visit: Payer: Self-pay | Admitting: Family Medicine

## 2019-05-13 DIAGNOSIS — E039 Hypothyroidism, unspecified: Secondary | ICD-10-CM

## 2019-05-13 MED ORDER — LEVOTHYROXINE SODIUM 100 MCG PO TABS: 100.0000 ug | ORAL_TABLET | Freq: Every day | ORAL | 1 refills | Status: DC

## 2019-05-13 NOTE — Progress Notes (Signed)
FYI:   I called the patient about the need for thyroid labs. She said she recently had this checked someone she is seeing for weight loss. She will contact them to get a copy of those results and then send them to [place] for her records here.

## 2019-05-20 ENCOUNTER — Other Ambulatory Visit: Payer: Self-pay | Admitting: Surgery

## 2019-05-20 ENCOUNTER — Encounter (INDEPENDENT_AMBULATORY_CARE_PROVIDER_SITE_OTHER): Payer: Self-pay | Admitting: Family Medicine

## 2019-05-21 NOTE — Progress Notes (Deleted)
Office Visit Note  Patient: Alexis Shelton             Date of Birth: 12/17/1941           MRN: [id number]             PCP: Lavada MesiHilts, Michael, MD Referring: Lavada MesiHilts, Michael, MD Visit Date: 06/04/2019 Occupation: @GUAROCC @  Subjective:  No chief complaint on file.   History of Present Illness: Alexis QuietGale C Deyoe is a [age] y.o. female ***   Activities of Daily Living:  Patient reports morning stiffness for *** {minute/hour:19697}.   Patient {ACTIONS;DENIES/REPORTS:21021675::"Denies"} nocturnal pain.  Difficulty dressing/grooming: {ACTIONS;DENIES/REPORTS:21021675::"Denies"} Difficulty climbing stairs: {ACTIONS;DENIES/REPORTS:21021675::"Denies"} Difficulty getting out of chair: {ACTIONS;DENIES/REPORTS:21021675::"Denies"} Difficulty using hands for taps, buttons, cutlery, and/or writing: {ACTIONS;DENIES/REPORTS:21021675::"Denies"}  No Rheumatology ROS completed.   PMFS History:  Patient Active Problem List   Diagnosis Date Noted  . Vitamin D deficiency 02/11/2019  . Stage 3 chronic kidney disease (HCC) 02/11/2019  . Mild intermittent asthma without complication 02/11/2019  . Primary osteoarthritis of both hands 11/24/2018  . Primary osteoarthritis of both feet 11/24/2018  . DDD (degenerative disc disease), cervical 11/24/2018  . DDD (degenerative disc disease), lumbar 11/24/2018  . History of stroke involving cerebellum 10/13/2018  . Gait abnormality 10/13/2018  . 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  . Asthma 08/12/2018  . Hypothyroidism 08/12/2018  . Rheumatoid arthritis (HCC) 07/04/2018  . Essential hypertension 07/04/2018  . Hyperlipidemia 07/04/2018    Past Medical History:  Diagnosis Date  . Dizziness   . Headache   . Hearing loss   . Hypercholesteremia   . Hypertension   . Thyroid disease     Family History  Problem Relation Age of Onset  . Stroke Mother   . Hyperlipidemia Mother   . Arthritis Mother   . Heart disease Father   . Arthritis Sister   . Bipolar  disorder Son   . Healthy Son    Past Surgical History:  Procedure Laterality Date  . ABDOMINAL HYSTERECTOMY    . CATARACT EXTRACTION, BILATERAL    . REPLACEMENT TOTAL KNEE Bilateral    Social History   Social History Narrative   Lives alone.   Right-handed.   1 cup coffee per day.    There is no immunization history on file for this patient.   Objective: Vital Signs: There were no vitals taken for this visit.   Physical Exam   Musculoskeletal Exam: ***  CDAI Exam: CDAI Score: Not documented Patient Global Assessment: Not documented; Provider Global Assessment: Not documented Swollen: Not documented; Tender: Not documented Joint Exam   Not documented   There is currently no information documented on the homunculus. Go to the Rheumatology activity and complete the homunculus joint exam.  Investigation: No additional findings.  Imaging: No results found.  Recent Labs: Lab Results  Component Value Date   WBC 6.9 10/13/2018   HGB 14.0 10/13/2018   PLT 270 10/13/2018   NA 141 11/06/2018   K 4.5 11/06/2018   CL 106 11/06/2018   CO2 27 11/06/2018   GLUCOSE 86 11/06/2018   BUN 30 (H) 11/06/2018   CREATININE 1.35 (H) 11/06/2018   BILITOT 0.3 11/06/2018   AST 15 11/06/2018   ALT 13 11/06/2018   PROT 7.2 11/06/2018   CALCIUM 9.2 11/06/2018   GFRAA 43 (L) 11/06/2018    Speciality Comments: No specialty comments available.  Procedures:  No procedures performed Allergies: Other   Assessment / Plan:     Visit Diagnoses: No  diagnosis found.   Orders: No orders of the defined types were placed in this encounter.  No orders of the defined types were placed in this encounter.   Face-to-face time spent with patient was *** minutes. Greater than 50% of time was spent in counseling and coordination of care.  Follow-Up Instructions: No follow-ups on file.   Ellen Henri, CMA  Note - This record has been created using Animal nutritionist.  Chart creation  errors have been sought, but may not always  have been located. Such creation errors do not reflect on  the standard of medical care.

## 2019-05-23 ENCOUNTER — Other Ambulatory Visit (INDEPENDENT_AMBULATORY_CARE_PROVIDER_SITE_OTHER): Payer: Self-pay | Admitting: Family Medicine

## 2019-05-27 ENCOUNTER — Other Ambulatory Visit: Payer: Self-pay | Admitting: Surgery

## 2019-05-27 DIAGNOSIS — R1032 Left lower quadrant pain: Secondary | ICD-10-CM

## 2019-06-03 ENCOUNTER — Other Ambulatory Visit: Payer: Self-pay

## 2019-06-03 ENCOUNTER — Ambulatory Visit (INDEPENDENT_AMBULATORY_CARE_PROVIDER_SITE_OTHER): Payer: Medicare Other | Admitting: Physician Assistant

## 2019-06-03 ENCOUNTER — Encounter: Payer: Self-pay | Admitting: Physician Assistant

## 2019-06-03 VITALS — BP 122/65 | HR 65 | Ht 66.0 in

## 2019-06-03 DIAGNOSIS — Z8673 Personal history of transient ischemic attack (TIA), and cerebral infarction without residual deficits: Secondary | ICD-10-CM

## 2019-06-03 DIAGNOSIS — M533 Sacrococcygeal disorders, not elsewhere classified: Secondary | ICD-10-CM

## 2019-06-03 DIAGNOSIS — M5030 Other cervical disc degeneration, unspecified cervical region: Secondary | ICD-10-CM

## 2019-06-03 DIAGNOSIS — G8929 Other chronic pain: Secondary | ICD-10-CM

## 2019-06-03 DIAGNOSIS — I10 Essential (primary) hypertension: Secondary | ICD-10-CM

## 2019-06-03 DIAGNOSIS — E785 Hyperlipidemia, unspecified: Secondary | ICD-10-CM

## 2019-06-03 DIAGNOSIS — Z8669 Personal history of other diseases of the nervous system and sense organs: Secondary | ICD-10-CM

## 2019-06-03 DIAGNOSIS — M19071 Primary osteoarthritis, right ankle and foot: Secondary | ICD-10-CM

## 2019-06-03 DIAGNOSIS — M19072 Primary osteoarthritis, left ankle and foot: Secondary | ICD-10-CM

## 2019-06-03 DIAGNOSIS — E559 Vitamin D deficiency, unspecified: Secondary | ICD-10-CM

## 2019-06-03 DIAGNOSIS — M19041 Primary osteoarthritis, right hand: Secondary | ICD-10-CM

## 2019-06-03 DIAGNOSIS — Z96653 Presence of artificial knee joint, bilateral: Secondary | ICD-10-CM

## 2019-06-03 DIAGNOSIS — Z5181 Encounter for therapeutic drug level monitoring: Secondary | ICD-10-CM

## 2019-06-03 DIAGNOSIS — M5136 Other intervertebral disc degeneration, lumbar region: Secondary | ICD-10-CM

## 2019-06-03 DIAGNOSIS — R7989 Other specified abnormal findings of blood chemistry: Secondary | ICD-10-CM

## 2019-06-03 DIAGNOSIS — M1A09X0 Idiopathic chronic gout, multiple sites, without tophus (tophi): Secondary | ICD-10-CM

## 2019-06-03 DIAGNOSIS — Z8639 Personal history of other endocrine, nutritional and metabolic disease: Secondary | ICD-10-CM

## 2019-06-03 DIAGNOSIS — M19042 Primary osteoarthritis, left hand: Secondary | ICD-10-CM

## 2019-06-03 MED ORDER — COLCHICINE 0.6 MG PO TABS: 0.6000 mg | ORAL_TABLET | Freq: Every day | ORAL | 1 refills | Status: DC | PRN

## 2019-06-03 NOTE — Progress Notes (Signed)
Office Visit Note  Patient: Alexis Shelton             Date of Birth: 1941-12-22           MRN: [id number]             PCP: Lavada Mesi, MD Referring: Lavada Mesi, MD Visit Date: 06/03/2019 Occupation: @GUAROCC @  Subjective:  Left CMC joint pain   History of Present Illness: Alexis Shelton is a 78 y.o. female with history of gout, osteoarthritis, and DDD. She is taking Allopurinol 100 mg po daily for the management of gout.  She denies missing any doses recently. She reports has been avoiding trigger foods and beer.  She states she had a flare about 2 weeks ago in the left elbow joint.  She states she took some leftover colchicine, which resolved the flare.  She is having increased left CMC joint pain.   She has chronic neck pain and experiences trapezius muscle tension and muscle tenderness bilaterally.  She has chronic lower back pain.  She states that she continues have some SI joint pain bilaterally.  She states that the left SI joint cortisone injection at her last visit did not provide any relief.  She reports her bilateral knee replacements are doing well.  She continues to walk with a cane.  She takes tramadol as needed for pain relief.    Activities of Daily Living:  Patient reports morning stiffness for 15 minutes.   Patient Denies nocturnal pain.  Difficulty dressing/grooming: Denies Difficulty climbing stairs: Denies Difficulty getting out of chair: Denies Difficulty using hands for taps, buttons, cutlery, and/or writing: Reports  Review of Systems  Constitutional: Positive for fatigue.  HENT: Negative for mouth sores, mouth dryness and nose dryness.   Eyes: Positive for dryness. Negative for pain and visual disturbance.  Respiratory: Negative for cough, hemoptysis, shortness of breath and difficulty breathing.   Cardiovascular: Negative for chest pain, palpitations, hypertension and swelling in legs/feet.  Gastrointestinal: Negative for blood in stool, constipation  and diarrhea.  Endocrine: Negative for increased urination.  Genitourinary: Negative for difficulty urinating and painful urination.  Musculoskeletal: Positive for arthralgias, joint pain, joint swelling and morning stiffness. Negative for myalgias, muscle weakness, muscle tenderness and myalgias.  Skin: Negative for color change, pallor, rash, hair loss, nodules/bumps, skin tightness, ulcers and sensitivity to sunlight.  Allergic/Immunologic: Negative for susceptible to infections.  Neurological: Negative for dizziness, numbness, headaches and weakness.  Hematological: Negative for bruising/bleeding tendency and swollen glands.  Psychiatric/Behavioral: Negative for depressed mood and sleep disturbance. The patient is not nervous/anxious.     PMFS History:  Patient Active Problem List   Diagnosis Date Noted  . Vitamin D deficiency 02/11/2019  . Stage 3 chronic kidney disease (HCC) 02/11/2019  . Mild intermittent asthma without complication 02/11/2019  . Primary osteoarthritis of both hands 11/24/2018  . Primary osteoarthritis of both feet 11/24/2018  . DDD (degenerative disc disease), cervical 11/24/2018  . DDD (degenerative disc disease), lumbar 11/24/2018  . History of stroke involving cerebellum 10/13/2018  . Gait abnormality 10/13/2018  . 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  . Asthma 08/12/2018  . Hypothyroidism 08/12/2018  . Rheumatoid arthritis (HCC) 07/04/2018  . Essential hypertension 07/04/2018  . Hyperlipidemia 07/04/2018    Past Medical History:  Diagnosis Date  . Dizziness   . Headache   . Hearing loss   . Hypercholesteremia   . Hypertension   . Thyroid disease     Family History  Problem  Relation Age of Onset  . Stroke Mother   . Hyperlipidemia Mother   . Arthritis Mother   . Heart disease Father   . Arthritis Sister   . Bipolar disorder Son   . Healthy Son    Past Surgical History:  Procedure Laterality Date  . ABDOMINAL HYSTERECTOMY    .  BREAST SURGERY    . CATARACT EXTRACTION, BILATERAL    . REPLACEMENT TOTAL KNEE Bilateral    Social History   Social History Narrative   Lives alone.   Right-handed.   1 cup coffee per day.    There is no immunization history on file for this patient.   Objective: Vital Signs: BP 122/65 (BP Location: Left Arm, Patient Position: Sitting, Cuff Size: Normal)   Pulse 65   Ht 5\' 6"  (1.676 m)   HC 16" (40.6 cm)   BMI 28.57 kg/m    Physical Exam Vitals signs and nursing note reviewed.  Constitutional:      Appearance: She is well-developed.  HENT:     Head: Normocephalic and atraumatic.  Eyes:     Conjunctiva/sclera: Conjunctivae normal.  Neck:     Musculoskeletal: Normal range of motion.  Cardiovascular:     Rate and Rhythm: Normal rate and regular rhythm.     Heart sounds: Normal heart sounds.  Pulmonary:     Effort: Pulmonary effort is normal.     Breath sounds: Normal breath sounds.  Abdominal:     General: Bowel sounds are normal.     Palpations: Abdomen is soft.  Lymphadenopathy:     Cervical: No cervical adenopathy.  Skin:    General: Skin is warm and dry.     Capillary Refill: Capillary refill takes less than 2 seconds.  Neurological:     Mental Status: She is alert and oriented to person, place, and time.  Psychiatric:        Behavior: Behavior normal.      Musculoskeletal Exam: C-spine limited ROM with lateral rotation.  Thoracic kyphosis noted.  Limited ROM of lumbar spine.  No midline spinal tenderness.  no SI joint tenderness.  PIP and DIP synovial thickening.  Bilateral CMC joint synovial thickening and tenderness.  Hip joints, knee joints, ankle joints, MTPs, PIPs, and DIPs good ROM with no synovitis.  No warmth or effusion of knee joints.  No tenderness or swelling of ankle joints.   CDAI Exam: CDAI Score: Not documented Patient Global Assessment: Not documented; Provider Global Assessment: Not documented Swollen: Not documented; Tender: Not  documented Joint Exam   Not documented   There is currently no information documented on the homunculus. Go to the Rheumatology activity and complete the homunculus joint exam.  Investigation: No additional findings.  Imaging: No results found.  Recent Labs: Lab Results  Component Value Date   WBC 6.9 10/13/2018   HGB 14.0 10/13/2018   PLT 270 10/13/2018   NA 141 11/06/2018   K 4.5 11/06/2018   CL 106 11/06/2018   CO2 27 11/06/2018   GLUCOSE 86 11/06/2018   BUN 30 (H) 11/06/2018   CREATININE 1.35 (H) 11/06/2018   BILITOT 0.3 11/06/2018   AST 15 11/06/2018   ALT 13 11/06/2018   PROT 7.2 11/06/2018   CALCIUM 9.2 11/06/2018   GFRAA 43 (L) 11/06/2018    Speciality Comments: No specialty comments available.  Procedures:  No procedures performed Allergies: Other   Assessment / Plan:     Visit Diagnoses: Idiopathic chronic gout of multiple sites without tophus -  She had a gout flare in the left elbow joint about 2 weeks ago.  The flare resolved after taking colchicine for several days.  She has no tenderness or inflammation on exam today.  She has good range of motion of the left elbow joint with no discomfort.  She continues to take allopurinol 100 mg 1 tablet by mouth daily.  She has not missed any doses of allopurinol recently.  She is avoided trigger foods and beer.  She requested a refill for colchicine which was sent to the pharmacy today.  Her uric acid level was 6.5 on 11/06/2018.  We will check her uric acid level today.  She was advised to notify us if she develops more frequent gout flares.  She will follow-up in the office in 6 months.   - Plan: Uric acid  Medication monitoring encounter -CBC and CMP will be drawn today to monitor for drug toxicity.  Plan: CBC with Differential/Platelet, COMPLETE METABOLIC PANEL WITH GFR   Primary osteoarthritis of both hands: She has PIP and DIP synovial thickening consistent with osteoarthritis of bilateral hands.  She has bilateral  CMC joint synovial thickening.  She has tenderness of the left CMC joint on exam.  She has been having more difficulties with ADLs due to the discomfort she experiences and left CMC joint brace.  We discussed using Voltaren gel and using a left CMC joint brace as needed.  Joint protection and muscle strengthening were discussed.  Primary osteoarthritis of both feet: She has PIP and DIP synovial thickening consistent with osteoarthritis of bilateral feet.  She has no discomfort in her feet at this time.  She wears proper fitting shoes.  DDD (degenerative disc disease), cervical: Chronic pain.  She has discomfort with lateral rotation.  She has no symptoms of radiculopathy at this time.  She experiences trapezius muscle tension and muscle tenderness bilaterally.  DDD (degenerative disc disease), lumbar: Chronic pain.  She has good range of motion at this time.  No midline spinal tenderness.  No SI joint tenderness.  She takes tramadol as needed for pain relief.  Status post total bilateral knee replacement: Doing well.  No warmth or effusion.  She has good ROM with no discomfort.   Chronic left SI joint pain: No SI joint tenderness on exam.  She has a left SI joint cortisone injection on 12/03/18.  She received minimal pain relief after the injection.   Other medical conditions are listed as follows:   Essential hypertension  Vitamin D deficiency  Dyslipidemia  History of hypothyroidism  History of stroke involving cerebellum  History of hearing loss   Orders: Orders Placed This Encounter  Procedures  . Uric acid  . CBC with Differential/Platelet  . COMPLETE METABOLIC PANEL WITH GFR   Meds ordered this encounter  Medications  . colchicine 0.6 MG tablet    Sig: Take 1 tablet (0.6 mg total) by mouth daily as needed.    Dispense:  30 tablet    Refill:  1    Face-to-face time spent with patient was 30 minutes. Greater than 50% of time was spent in counseling and coordination of  care.  Follow-Up Instructions: Return in about 6 months (around 12/03/2019) for Gout, Osteoarthritis, DDD.   Gearldine Bienenstock, PA-C  Note - This record has been created using Dragon software.  Chart creation errors have been sought, but may not always  have been located. Such creation errors do not reflect on  the standard of medical care.

## 2019-06-04 ENCOUNTER — Ambulatory Visit: Payer: Self-pay | Admitting: Rheumatology

## 2019-06-04 ENCOUNTER — Other Ambulatory Visit: Payer: Self-pay | Admitting: Family Medicine

## 2019-06-04 ENCOUNTER — Other Ambulatory Visit: Payer: Self-pay | Admitting: Physician Assistant

## 2019-06-04 LAB — CBC WITH DIFFERENTIAL/PLATELET
Absolute Monocytes: 796 cells/uL (ref 200–950)
Basophils Absolute: 51 cells/uL (ref 0–200)
Basophils Relative: 0.7 %
Eosinophils Absolute: 387 cells/uL (ref 15–500)
Eosinophils Relative: 5.3 %
HCT: 42 % (ref 35.0–45.0)
Hemoglobin: 14.1 g/dL (ref 11.7–15.5)
Lymphs Abs: 1869 cells/uL (ref 850–3900)
MCH: 30.6 pg (ref 27.0–33.0)
MCHC: 33.6 g/dL (ref 32.0–36.0)
MCV: 91.1 fL (ref 80.0–100.0)
MPV: 10 fL (ref 7.5–12.5)
Monocytes Relative: 10.9 %
Neutro Abs: 4198 cells/uL (ref 1500–7800)
Neutrophils Relative %: 57.5 %
Platelets: 249 10*3/uL (ref 140–400)
RBC: 4.61 10*6/uL (ref 3.80–5.10)
RDW: 14.6 % (ref 11.0–15.0)
Total Lymphocyte: 25.6 %
WBC: 7.3 10*3/uL (ref 3.8–10.8)

## 2019-06-04 LAB — COMPLETE METABOLIC PANEL WITH GFR
AG Ratio: 1.5 (calc) (ref 1.0–2.5)
ALT: 13 U/L (ref 6–29)
AST: 14 U/L (ref 10–35)
Albumin: 4.3 g/dL (ref 3.6–5.1)
Alkaline phosphatase (APISO): 59 U/L (ref 37–153)
BUN/Creatinine Ratio: 23 (calc) — ABNORMAL HIGH (ref 6–22)
BUN: 33 mg/dL — ABNORMAL HIGH (ref 7–25)
CO2: 28 mmol/L (ref 20–32)
Calcium: 9.5 mg/dL (ref 8.6–10.4)
Chloride: 105 mmol/L (ref 98–110)
Creat: 1.44 mg/dL — ABNORMAL HIGH (ref 0.60–0.93)
GFR, Est African American: 40 mL/min/{1.73_m2} — ABNORMAL LOW (ref >=60)
GFR, Est Non African American: 34 mL/min/{1.73_m2} — ABNORMAL LOW (ref >=60)
Globulin: 2.8 g/dL (calc) (ref 1.9–3.7)
Glucose, Bld: 87 mg/dL (ref 65–99)
Potassium: 5.3 mmol/L (ref 3.5–5.3)
Sodium: 141 mmol/L (ref 135–146)
Total Bilirubin: 0.5 mg/dL (ref 0.2–1.2)
Total Protein: 7.1 g/dL (ref 6.1–8.1)

## 2019-06-04 LAB — URIC ACID: Uric Acid, Serum: 5.9 mg/dL (ref 2.5–7.0)

## 2019-06-04 MED ORDER — PHENTERMINE HCL 37.5 MG PO TABS: 18.7500 mg | ORAL_TABLET | Freq: Every day | ORAL | 3 refills | Status: DC

## 2019-06-04 NOTE — Progress Notes (Signed)
Uric acid is within desirable range. Please advise patient to continue taking Allopurinol 100 mg po daily.  CBC is WNL.  Creatinine is elevated and trending up.  GFR is 34. I would recommend following up with a nephrologist if she has one if not we can place a referral.

## 2019-06-04 NOTE — Addendum Note (Signed)
Addended by: Mariella Saa C on: 06/04/2019 12:43 PM   Modules accepted: Orders

## 2019-06-05 ENCOUNTER — Other Ambulatory Visit: Payer: Self-pay | Admitting: *Deleted

## 2019-06-27 ENCOUNTER — Other Ambulatory Visit (INDEPENDENT_AMBULATORY_CARE_PROVIDER_SITE_OTHER): Payer: Self-pay | Admitting: Family Medicine

## 2019-07-14 ENCOUNTER — Encounter: Payer: Self-pay | Admitting: Family Medicine

## 2019-07-14 ENCOUNTER — Ambulatory Visit (INDEPENDENT_AMBULATORY_CARE_PROVIDER_SITE_OTHER): Payer: Medicare Other | Admitting: Family Medicine

## 2019-07-14 DIAGNOSIS — M545 Low back pain, unspecified: Secondary | ICD-10-CM

## 2019-07-14 DIAGNOSIS — G8929 Other chronic pain: Secondary | ICD-10-CM

## 2019-07-14 NOTE — Progress Notes (Signed)
Office Visit Note   Patient: Alexis Shelton           Date of Birth: 1941/05/09           MRN: [id number] Visit Date: 07/14/2019 Requested by: Eunice Blase, MD [address] [place],  [place] [address] PCP: Eunice Blase, MD  Subjective: Chief Complaint  Patient presents with  . Lower Back - Pain    Pain, starting in right lower leg, radiating up to right buttock x a couple weeks. Occurs intermittently. "Excruciatingly painful" when it occurs.    HPI: She is here with low back and right leg pain.  Symptoms started a couple weeks ago, no definite injury.  Pain worse in the morning when she first gets up, sometimes she has to lie down again for couple hours until the pain goes away.  Then is usually better and she can function through the day without needing medication.  Denies any weakness or numbness, bowel or bladder dysfunction.  She had an MRI scan in November showing advanced degenerative changes with L5-S1 severe left-sided foraminal stenosis.               ROS: No fevers or chills.  All other systems were reviewed and are negative.  Objective: Vital Signs: There were no vitals taken for this visit.  Physical Exam:  General:  Alert and oriented, in no acute distress. Pulm:  Breathing unlabored. Psy:  Normal mood, congruent affect. Skin: No rash on her skin. Low back: Slightly tender over the superior aspect of the sacrum.  Slightly tender in the right sciatic notch.  Straight leg raise negative, lower extremity strength and reflexes normal.  Imaging: None today.  Assessment & Plan: 1.  Low back and right leg pain with nonfocal neurologic exam, possibly related to L5-S1 foraminal stenosis. -Referral to physical therapy.  Could contemplate epidural steroid injection if symptoms worsen.  Follow-up as needed.     Procedures: No procedures performed  No notes on file     PMFS History: Patient Active Problem List   Diagnosis Date Noted  . Vitamin D deficiency  02/11/2019  . Stage 3 chronic kidney disease ([facility]) 02/11/2019  . Mild intermittent asthma without complication 68/34/1962  . Primary osteoarthritis of both hands 11/24/2018  . Primary osteoarthritis of both feet 11/24/2018  . DDD (degenerative disc disease), cervical 11/24/2018  . DDD (degenerative disc disease), lumbar 11/24/2018  . History of stroke involving cerebellum 10/13/2018  . Gait abnormality 10/13/2018  . 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  . Asthma 08/12/2018  . Hypothyroidism 08/12/2018  . Rheumatoid arthritis ([facility]) 07/04/2018  . Essential hypertension 07/04/2018  . Hyperlipidemia 07/04/2018   Past Medical History:  Diagnosis Date  . Dizziness   . Headache   . Hearing loss   . Hypercholesteremia   . Hypertension   . Thyroid disease     Family History  Problem Relation Age of Onset  . Stroke Mother   . Hyperlipidemia Mother   . Arthritis Mother   . Heart disease Father   . Arthritis Sister   . Bipolar disorder Son   . Healthy Son     Past Surgical History:  Procedure Laterality Date  . ABDOMINAL HYSTERECTOMY    . BREAST SURGERY    . CATARACT EXTRACTION, BILATERAL    . REPLACEMENT TOTAL KNEE Bilateral    Social History   Occupational History  . Occupation: Retired - Animator  Tobacco Use  . Smoking status: Former Smoker  Types: Cigarettes    Quit date: 1970    Years since quitting: 50.5  . Smokeless tobacco: Never Used  Substance and Sexual Activity  . Alcohol use: Not Currently    Frequency: Never  . Drug use: Never  . Sexual activity: Not on file

## 2019-07-31 ENCOUNTER — Encounter: Payer: Self-pay | Admitting: Family Medicine

## 2019-07-31 ENCOUNTER — Other Ambulatory Visit: Payer: Self-pay | Admitting: Family Medicine

## 2019-07-31 MED ORDER — FUROSEMIDE 20 MG PO TABS: 20.0000 mg | ORAL_TABLET | Freq: Every day | ORAL | 1 refills | Status: DC

## 2019-07-31 MED ORDER — LEVOTHYROXINE SODIUM 100 MCG PO TABS: 100.0000 ug | ORAL_TABLET | Freq: Every day | ORAL | 1 refills | Status: DC

## 2019-07-31 NOTE — Telephone Encounter (Signed)
You have put lab orders in her chart for around 08/13/2019 for several blood tests, including the thyroid. Is it ok to wait until the end of the month to just draw everything at the same time?

## 2019-10-15 ENCOUNTER — Other Ambulatory Visit: Payer: Self-pay | Admitting: Family Medicine

## 2019-10-15 MED ORDER — LOSARTAN POTASSIUM 100 MG PO TABS: 100.0000 mg | ORAL_TABLET | Freq: Every day | ORAL | 1 refills | Status: DC

## 2019-10-21 ENCOUNTER — Other Ambulatory Visit (INDEPENDENT_AMBULATORY_CARE_PROVIDER_SITE_OTHER): Payer: Self-pay | Admitting: Family Medicine

## 2019-10-22 ENCOUNTER — Encounter: Payer: Self-pay | Admitting: Family Medicine

## 2019-10-22 ENCOUNTER — Other Ambulatory Visit: Payer: Self-pay | Admitting: Family Medicine

## 2019-10-22 MED ORDER — BUDESONIDE-FORMOTEROL FUMARATE 160-4.5 MCG/ACT IN AERO: 2.0000 | INHALATION_SPRAY | Freq: Two times a day (BID) | RESPIRATORY_TRACT | 3 refills | Status: DC

## 2019-10-22 MED ORDER — PHENTERMINE HCL 37.5 MG PO TABS: 18.7500 mg | ORAL_TABLET | Freq: Every day | ORAL | 3 refills | Status: DC

## 2019-10-22 MED ORDER — FUROSEMIDE 20 MG PO TABS: 20.0000 mg | ORAL_TABLET | Freq: Every day | ORAL | 1 refills | Status: DC

## 2019-10-22 MED ORDER — LOSARTAN POTASSIUM 100 MG PO TABS: 100.0000 mg | ORAL_TABLET | Freq: Every day | ORAL | 1 refills | Status: DC

## 2019-10-22 MED ORDER — MONTELUKAST SODIUM 10 MG PO TABS: 10.0000 mg | ORAL_TABLET | Freq: Every day | ORAL | 3 refills | Status: DC

## 2019-10-22 MED ORDER — LEVOTHYROXINE SODIUM 100 MCG PO TABS: 100.0000 ug | ORAL_TABLET | Freq: Every day | ORAL | 1 refills | Status: DC

## 2019-10-22 MED ORDER — ALBUTEROL SULFATE HFA 108 (90 BASE) MCG/ACT IN AERS: 2.0000 | INHALATION_SPRAY | Freq: Four times a day (QID) | RESPIRATORY_TRACT | 3 refills | Status: DC | PRN

## 2019-10-22 MED ORDER — AMLODIPINE BESYLATE 10 MG PO TABS: 10.0000 mg | ORAL_TABLET | Freq: Every day | ORAL | 3 refills | Status: DC

## 2019-10-22 MED ORDER — COLCHICINE 0.6 MG PO TABS: ORAL_TABLET | ORAL | 1 refills | Status: DC

## 2019-10-22 MED ORDER — ATORVASTATIN CALCIUM 10 MG PO TABS: 10.0000 mg | ORAL_TABLET | Freq: Every day | ORAL | 3 refills | Status: DC

## 2019-10-22 MED ORDER — ALLOPURINOL 100 MG PO TABS: 100.0000 mg | ORAL_TABLET | Freq: Every day | ORAL | 3 refills | Status: DC

## 2019-11-03 ENCOUNTER — Encounter: Payer: Self-pay | Admitting: Family Medicine

## 2019-11-03 ENCOUNTER — Other Ambulatory Visit: Payer: Self-pay | Admitting: Family Medicine

## 2019-11-03 DIAGNOSIS — K589 Irritable bowel syndrome without diarrhea: Secondary | ICD-10-CM

## 2019-11-03 DIAGNOSIS — R21 Rash and other nonspecific skin eruption: Secondary | ICD-10-CM

## 2019-12-01 NOTE — Progress Notes (Signed)
Virtual Visit via Telephone Note  I connected with Alexis Shelton on 12/02/19 at 10:45 AM EST by telephone and verified that I am speaking with the correct person using two identifiers.  Location: Patient: Home  Provider: Clinic  This service was conducted via virtual visit. The patient was located at home. I was located in my office.  Consent was obtained prior to the virtual visit and is aware of possible charges through their insurance for this visit.  The patient is an established patient.  Dr. Corliss Skains, MD conducted the virtual visit and Sherron Ales, PA-C acted as scribe during the service.  Office staff helped with scheduling follow up visits after the service was conducted.   I discussed the limitations, risks, security and privacy concerns of performing an evaluation and management service by telephone and the availability of in person appointments. I also discussed with the patient that there may be a patient responsible charge related to this service. The patient expressed understanding and agreed to proceed.  CC: Right middle trigger finger  History of Present Illness: Patient is a 78 year old female with a past medical history of gout, osteoarthritis, and DDD. She takes allopurinol 100 mg 1 tablet by mouth daily for management of gout. She states she had a recent flare in her right foot.  She states she took colchicine 0.6 mg 1 tablet PRN, which resolved her symptoms.  She has not been able to identify a trigger since she has been avoiding purine rich foods and alcohol.  She is unsure if she may have missed several doses of allopurinol. She has persistent lower back pain and left sided sciatica.  She experiences discomfort and stiffness in the morning.   She has developed a right middle trigger finger.  She is having increased difficulty writing and carrying objects due to the locking and discomfort.   Review of Systems  Constitutional: Positive for malaise/fatigue. Negative for fever.   Eyes: Negative for photophobia, pain, discharge and redness.  Respiratory: Negative for cough, shortness of breath and wheezing.   Cardiovascular: Negative for chest pain and palpitations.  Gastrointestinal: Negative for blood in stool, constipation and diarrhea.  Genitourinary: Negative for dysuria.  Musculoskeletal: Positive for back pain and joint pain. Negative for myalgias and neck pain.  Skin: Negative for rash.  Neurological: Negative for dizziness and headaches.  Psychiatric/Behavioral: Negative for depression. The patient is not nervous/anxious and does not have insomnia.       Observations/Objective: Physical Exam  Constitutional: She is oriented to person, place, and time.  Neurological: She is alert and oriented to person, place, and time.  Psychiatric: Mood, memory, affect and judgment normal.   Patient reports morning stiffness for 30 minutes.   Patient denies nocturnal pain.  Difficulty dressing/grooming: Denies Difficulty climbing stairs: Denies Difficulty getting out of chair: Denies Difficulty using hands for taps, buttons, cutlery, and/or writing: Denies   Assessment and Plan: Visit Diagnoses: Idiopathic chronic gout of multiple sites without tophus -  She had a recent gout flare in the right foot.  She takes allopurinol 100 mg 1 tablet by mouth daily and colchicine 0.6 mg 1 tablet daily as needed.  She is unsure if she missed doses of allopurinol prior to the flare. She took colchicine 0.6 mg 1 tablet daily as needed, which resolved the flare.  We discussed the importance of avoiding purine rich foods and alcohol. She tries to follow a vegetarian diet.  Uric acid was 5.9 on 06/03/19.  She is  due to update uric acid, CBC, and CMP.  Future orders will be placed today.  She will continue taking allopurinol as prescribed.  She does not need any refills at this time.  She was advised to notify us if she develops signs or symptoms of a flare.  She will follow up in 6 months.     Medication monitoring encounter -Future orders for CBC, CMP, and uric acid level will be placed today.   Primary osteoarthritis of both hands: She has no hand pain or joint swelling currently.  Joint protection and muscle strengthening were discussed.   Primary osteoarthritis of both feet: She has no discomfort at this time.   DDD (degenerative disc disease), cervical: Chronic pain.    DDD (degenerative disc disease), lumbar: She is having increased lower back pain and has intermittent symptoms of sciatica.    Status post total bilateral knee replacement: She has been having increased discomfort in both knee joint replacements.  No joint swelling.   Chronic left SI joint pain: She has intermittent left SI joint pain and left sided sciatica.   Trigger finger, right middle finger: She has been experiencing intermittent locking and tenderness.  We discussed using a splint or buddy tape.  She was advised to notify us if she would like to schedule an ultrasound guided cortisone injection.   Other medical conditions are listed as follows:   Essential hypertension  Vitamin D deficiency  Dyslipidemia  History of hypothyroidism  History of stroke involving cerebellum  History of hearing loss  Follow Up Instructions: She will follow up in 6 months.    I discussed the assessment and treatment plan with the patient. The patient was provided an opportunity to ask questions and all were answered. The patient agreed with the plan and demonstrated an understanding of the instructions.   The patient was advised to call back or seek an in-person evaluation if the symptoms worsen or if the condition fails to improve as anticipated.  I provided 25 minutes of non-face-to-face time during this encounter.   Bo Merino, MD    Scribed by-  Hazel Sams PA-C

## 2019-12-02 ENCOUNTER — Encounter: Payer: Self-pay | Admitting: Rheumatology

## 2019-12-02 ENCOUNTER — Other Ambulatory Visit: Payer: Self-pay

## 2019-12-02 ENCOUNTER — Telehealth (INDEPENDENT_AMBULATORY_CARE_PROVIDER_SITE_OTHER): Payer: Medicare Other | Admitting: Rheumatology

## 2019-12-02 DIAGNOSIS — E785 Hyperlipidemia, unspecified: Secondary | ICD-10-CM

## 2019-12-02 DIAGNOSIS — M19042 Primary osteoarthritis, left hand: Secondary | ICD-10-CM

## 2019-12-02 DIAGNOSIS — Z8673 Personal history of transient ischemic attack (TIA), and cerebral infarction without residual deficits: Secondary | ICD-10-CM

## 2019-12-02 DIAGNOSIS — M1A09X0 Idiopathic chronic gout, multiple sites, without tophus (tophi): Secondary | ICD-10-CM | POA: Diagnosis not present

## 2019-12-02 DIAGNOSIS — M65331 Trigger finger, right middle finger: Secondary | ICD-10-CM

## 2019-12-02 DIAGNOSIS — E559 Vitamin D deficiency, unspecified: Secondary | ICD-10-CM

## 2019-12-02 DIAGNOSIS — M5136 Other intervertebral disc degeneration, lumbar region: Secondary | ICD-10-CM

## 2019-12-02 DIAGNOSIS — Z96653 Presence of artificial knee joint, bilateral: Secondary | ICD-10-CM

## 2019-12-02 DIAGNOSIS — M19071 Primary osteoarthritis, right ankle and foot: Secondary | ICD-10-CM

## 2019-12-02 DIAGNOSIS — M19072 Primary osteoarthritis, left ankle and foot: Secondary | ICD-10-CM | POA: Diagnosis not present

## 2019-12-02 DIAGNOSIS — Z5181 Encounter for therapeutic drug level monitoring: Secondary | ICD-10-CM

## 2019-12-02 DIAGNOSIS — I10 Essential (primary) hypertension: Secondary | ICD-10-CM

## 2019-12-02 DIAGNOSIS — M19041 Primary osteoarthritis, right hand: Secondary | ICD-10-CM

## 2019-12-02 DIAGNOSIS — M5030 Other cervical disc degeneration, unspecified cervical region: Secondary | ICD-10-CM

## 2019-12-02 DIAGNOSIS — M533 Sacrococcygeal disorders, not elsewhere classified: Secondary | ICD-10-CM

## 2019-12-02 DIAGNOSIS — G8929 Other chronic pain: Secondary | ICD-10-CM

## 2019-12-02 DIAGNOSIS — Z8639 Personal history of other endocrine, nutritional and metabolic disease: Secondary | ICD-10-CM

## 2019-12-07 ENCOUNTER — Encounter: Payer: Self-pay | Admitting: Family Medicine

## 2019-12-07 ENCOUNTER — Other Ambulatory Visit: Payer: Self-pay

## 2019-12-07 ENCOUNTER — Ambulatory Visit (INDEPENDENT_AMBULATORY_CARE_PROVIDER_SITE_OTHER): Payer: Medicare Other | Admitting: Family Medicine

## 2019-12-07 VITALS — BP 152/68 | HR 64

## 2019-12-07 DIAGNOSIS — R7303 Prediabetes: Secondary | ICD-10-CM

## 2019-12-07 DIAGNOSIS — N183 Chronic kidney disease, stage 3 unspecified: Secondary | ICD-10-CM

## 2019-12-07 DIAGNOSIS — E785 Hyperlipidemia, unspecified: Secondary | ICD-10-CM

## 2019-12-07 DIAGNOSIS — I10 Essential (primary) hypertension: Secondary | ICD-10-CM

## 2019-12-07 NOTE — Patient Instructions (Signed)
   Stop amlodipine for now.  Take 1/2 tablet (50 mg) of losartan daily.  We may need to go back up to 100 mg daily if blood pressure stays up.

## 2019-12-07 NOTE — Progress Notes (Signed)
Office Visit Note   Patient: Alexis Shelton           Date of Birth: 07-Mar-1941           MRN: [id number] Visit Date: 12/07/2019 Requested by: Lavada Mesi, MD [address] [place],  [place] [address] PCP: Lavada Mesi, MD  Subjective: Chief Complaint  Patient presents with  . Blood Pressure Check  . shortness of breath    HPI: She is here with blood pressure concerns.  Recently she went to donate blood in her pressure was low at 100 systolic, they would not let her donate.  Her readings have been consistently at this level.  She does not have lightheadedness with position changes, but she does note that she gets tired more easily in the afternoons when she sits down.  Denies any chest pain or shortness of breath.  She has peripheral edema which is chronic for her, she takes furosemide daily.  In the last 2 days she stopped taking amlodipine and losartan to see what her blood pressure would do.              ROS:   All other systems were reviewed and are negative.  Objective: Vital Signs: BP (!) 152/68   Pulse 64   SpO2 97%   Physical Exam:  General:  Alert and oriented, in no acute distress. Pulm:  Breathing unlabored. Psy:  Normal mood, congruent affect.  Neck: No carotid bruits. CV: Regular rate and rhythm without murmurs, rubs, or gallops.  2+ peripheral edema.  2+ radial and posterior tibial pulses. Lungs: Clear to auscultation throughout with no wheezing or areas of consolidation.    Imaging: None today  Assessment & Plan: 1.  Hypertension, uncontrolled today but has been running low on current regimen. -We will stop amlodipine and resume losartan at 50 mg daily.  We will increase to 100 mg daily if needed. -She is going for labs tomorrow. -Monitor blood pressure at home, return in 3 months here.     Procedures: No procedures performed  No notes on file     PMFS History: Patient Active Problem List   Diagnosis Date Noted  . Vitamin D deficiency  02/11/2019  . Stage 3 chronic kidney disease 02/11/2019  . Mild intermittent asthma without complication 02/11/2019  . Primary osteoarthritis of both hands 11/24/2018  . Primary osteoarthritis of both feet 11/24/2018  . DDD (degenerative disc disease), cervical 11/24/2018  . DDD (degenerative disc disease), lumbar 11/24/2018  . History of stroke involving cerebellum 10/13/2018  . Gait abnormality 10/13/2018  . 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  . Asthma 08/12/2018  . Hypothyroidism 08/12/2018  . Rheumatoid arthritis (HCC) 07/04/2018  . Essential hypertension 07/04/2018  . Hyperlipidemia 07/04/2018   Past Medical History:  Diagnosis Date  . Dizziness   . Headache   . Hearing loss   . Hypercholesteremia   . Hypertension   . Thyroid disease     Family History  Problem Relation Age of Onset  . Stroke Mother   . Hyperlipidemia Mother   . Arthritis Mother   . Heart disease Father   . Arthritis Sister   . Bipolar disorder Son   . Healthy Son     Past Surgical History:  Procedure Laterality Date  . ABDOMINAL HYSTERECTOMY    . BREAST SURGERY    . CATARACT EXTRACTION, BILATERAL    . REPLACEMENT TOTAL KNEE Bilateral    Social History   Occupational History  . Occupation:  Retired - Animator  Tobacco Use  . Smoking status: Former Smoker    Types: Cigarettes    Quit date: 1970    Years since quitting: 50.9  . Smokeless tobacco: Never Used  Substance and Sexual Activity  . Alcohol use: Not Currently  . Drug use: Never  . Sexual activity: Not on file

## 2019-12-08 ENCOUNTER — Other Ambulatory Visit: Payer: Self-pay

## 2019-12-08 DIAGNOSIS — M1A09X0 Idiopathic chronic gout, multiple sites, without tophus (tophi): Secondary | ICD-10-CM

## 2019-12-08 DIAGNOSIS — Z5181 Encounter for therapeutic drug level monitoring: Secondary | ICD-10-CM

## 2019-12-09 ENCOUNTER — Telehealth: Payer: Self-pay | Admitting: Family Medicine

## 2019-12-09 LAB — LIPID PANEL
Cholesterol: 199 mg/dL (ref <200)
HDL: 45 mg/dL — ABNORMAL LOW (ref >=50)
LDL Cholesterol (Calc): 127 mg/dL (calc) — ABNORMAL HIGH
Non-HDL Cholesterol (Calc): 154 mg/dL (calc) — ABNORMAL HIGH (ref <130)
Total CHOL/HDL Ratio: 4.4 (calc) (ref <5.0)
Triglycerides: 152 mg/dL — ABNORMAL HIGH (ref <150)

## 2019-12-09 LAB — COMPLETE METABOLIC PANEL WITH GFR
AG Ratio: 1.6 (calc) (ref 1.0–2.5)
ALT: 14 U/L (ref 6–29)
AST: 16 U/L (ref 10–35)
Albumin: 4.2 g/dL (ref 3.6–5.1)
Alkaline phosphatase (APISO): 54 U/L (ref 37–153)
BUN/Creatinine Ratio: 17 (calc) (ref 6–22)
BUN: 24 mg/dL (ref 7–25)
CO2: 24 mmol/L (ref 20–32)
Calcium: 9.3 mg/dL (ref 8.6–10.4)
Chloride: 106 mmol/L (ref 98–110)
Creat: 1.41 mg/dL — ABNORMAL HIGH (ref 0.60–0.88)
GFR, Est African American: 41 mL/min/{1.73_m2} — ABNORMAL LOW (ref >=60)
GFR, Est Non African American: 35 mL/min/{1.73_m2} — ABNORMAL LOW (ref >=60)
Globulin: 2.7 g/dL (calc) (ref 1.9–3.7)
Glucose, Bld: 87 mg/dL (ref 65–99)
Potassium: 4.4 mmol/L (ref 3.5–5.3)
Sodium: 143 mmol/L (ref 135–146)
Total Bilirubin: 0.4 mg/dL (ref 0.2–1.2)
Total Protein: 6.9 g/dL (ref 6.1–8.1)

## 2019-12-09 LAB — CBC WITH DIFFERENTIAL/PLATELET
Absolute Monocytes: 693 cells/uL (ref 200–950)
Basophils Absolute: 40 cells/uL (ref 0–200)
Basophils Relative: 0.6 %
Eosinophils Absolute: 422 cells/uL (ref 15–500)
Eosinophils Relative: 6.4 %
HCT: 41.7 % (ref 35.0–45.0)
Hemoglobin: 13.7 g/dL (ref 11.7–15.5)
Lymphs Abs: 1789 cells/uL (ref 850–3900)
MCH: 30.5 pg (ref 27.0–33.0)
MCHC: 32.9 g/dL (ref 32.0–36.0)
MCV: 92.9 fL (ref 80.0–100.0)
MPV: 10.2 fL (ref 7.5–12.5)
Monocytes Relative: 10.5 %
Neutro Abs: 3656 cells/uL (ref 1500–7800)
Neutrophils Relative %: 55.4 %
Platelets: 253 10*3/uL (ref 140–400)
RBC: 4.49 10*6/uL (ref 3.80–5.10)
RDW: 14.8 % (ref 11.0–15.0)
Total Lymphocyte: 27.1 %
WBC: 6.6 10*3/uL (ref 3.8–10.8)

## 2019-12-09 LAB — HEMOGLOBIN A1C
Hgb A1c MFr Bld: 6 % of total Hgb — ABNORMAL HIGH (ref <5.7)
Mean Plasma Glucose: 126 (calc)
eAG (mmol/L): 7 (calc)

## 2019-12-09 LAB — URIC ACID: Uric Acid, Serum: 6.7 mg/dL (ref 2.5–7.0)

## 2019-12-09 LAB — THYROID PANEL WITH TSH
Free Thyroxine Index: 2.1 (ref 1.4–3.8)
T3 Uptake: 30 % (ref 22–35)
T4, Total: 6.9 ug/dL (ref 5.1–11.9)
TSH: 2.44 mIU/L (ref 0.40–4.50)

## 2019-12-09 NOTE — Progress Notes (Signed)
CBC WNL.  Creatinine is elevated but stable.  GFR is 35. She is taking Lasix.  Please advise pt to avoid taking NSAIDs.  Forward labs to PCP. Uric acid is 6.7.  She is taking allopurinol 100 mg daily.  Please advise patient to avoid purine rich foods.  Ideally her uric acid should be less than 6.

## 2019-12-09 NOTE — Telephone Encounter (Signed)
Lipids are slightly abnormal.  Generally this improves with dietary limitation of breads; pastas; cereals; sugars/sweets.  Would recheck in about 6 months.  Other results looked good.

## 2019-12-15 ENCOUNTER — Telehealth: Payer: Self-pay | Admitting: Rheumatology

## 2019-12-15 NOTE — Telephone Encounter (Signed)
-----   Message from Shona Needles, RT sent at 12/03/2019 11:22 AM EST ----- Regarding: 6 MONTH F/U

## 2019-12-15 NOTE — Telephone Encounter (Signed)
I LMOM for patient o call, and schedule a follow up appointment around 06/02/2020.

## 2019-12-23 ENCOUNTER — Telehealth: Payer: Self-pay

## 2019-12-23 NOTE — Telephone Encounter (Signed)
I called the patient to let her know her lab results were sent through MyChart to her by Dr. Junius Roads. She can check that, or she may call back and I will go through them with her.

## 2019-12-29 ENCOUNTER — Telehealth: Payer: Self-pay | Admitting: Family Medicine

## 2019-12-29 ENCOUNTER — Other Ambulatory Visit: Payer: Self-pay

## 2019-12-29 NOTE — Telephone Encounter (Signed)
Patient returned call -asked for a call back as soon as possible. Patient advised she would like to speak with Terri. The number to contact patient is [phone]

## 2019-12-29 NOTE — Telephone Encounter (Signed)
I called and reached the patient's voice mail. Left a message for her to call me back, or I will try her later today.

## 2019-12-29 NOTE — Telephone Encounter (Signed)
Mailed the patient a copy of her lab results from 12/07/19, along with the message that Dr. Prince Rome had sent her through MyChart, regarding the results.

## 2019-12-29 NOTE — Telephone Encounter (Signed)
I called the patient and gave her the lab results from 12/07/19. She asked if her medication list could be sent to [facility] mail order pharmacy, as she will start using this now. Done.

## 2019-12-30 ENCOUNTER — Ambulatory Visit: Payer: Medicare Other | Admitting: Internal Medicine

## 2020-01-15 ENCOUNTER — Other Ambulatory Visit (INDEPENDENT_AMBULATORY_CARE_PROVIDER_SITE_OTHER): Payer: Self-pay | Admitting: Family Medicine

## 2020-01-15 ENCOUNTER — Other Ambulatory Visit: Payer: Self-pay | Admitting: Family Medicine

## 2020-01-15 MED ORDER — LOSARTAN POTASSIUM 50 MG PO TABS: 50.0000 mg | ORAL_TABLET | Freq: Every day | ORAL | 1 refills | Status: DC

## 2020-02-15 ENCOUNTER — Encounter: Payer: Self-pay | Admitting: Internal Medicine

## 2020-02-15 ENCOUNTER — Ambulatory Visit (INDEPENDENT_AMBULATORY_CARE_PROVIDER_SITE_OTHER): Payer: Medicare Other | Admitting: Internal Medicine

## 2020-02-15 VITALS — BP 130/70 | HR 80 | Temp 99.0°F | Ht 62.75 in | Wt 195.1 lb

## 2020-02-15 DIAGNOSIS — E739 Lactose intolerance, unspecified: Secondary | ICD-10-CM

## 2020-02-15 DIAGNOSIS — K219 Gastro-esophageal reflux disease without esophagitis: Secondary | ICD-10-CM | POA: Diagnosis not present

## 2020-02-15 DIAGNOSIS — R197 Diarrhea, unspecified: Secondary | ICD-10-CM | POA: Diagnosis not present

## 2020-02-15 DIAGNOSIS — R1319 Other dysphagia: Secondary | ICD-10-CM

## 2020-02-15 DIAGNOSIS — R1310 Dysphagia, unspecified: Secondary | ICD-10-CM | POA: Diagnosis not present

## 2020-02-15 DIAGNOSIS — R32 Unspecified urinary incontinence: Secondary | ICD-10-CM

## 2020-02-15 DIAGNOSIS — R159 Full incontinence of feces: Secondary | ICD-10-CM

## 2020-02-15 NOTE — Patient Instructions (Addendum)
Today we are giving you information to read on lactose free diet. Avoid milk products.   You have been scheduled for an endoscopy. Please follow written instructions given to you at your visit today. If you use inhalers (even only as needed), please bring them with you on the day of your procedure.  You will be contacted about setting up an appointment for pelvic floor physical therapy.   I appreciate the opportunity to care for you. Stan Head, MD, [facility]

## 2020-02-15 NOTE — Progress Notes (Signed)
Alexis Shelton 79 y.o. [birth year] [id number]  Assessment & Plan:   Encounter Diagnoses  Name Primary?  . Esophageal dysphagia Yes  . Gastroesophageal reflux disease, unspecified whether esophagitis present   . Diarrhea - episodic   . Urinary and fecal incontinence   . Lactose intolerance ?     Because of the dysphagia she needs an upper GI endoscopy and possible esophageal dilation.  Further plans regarding what I think is reflux related disease pending that.  The risks and benefits as well as alternatives of endoscopic procedure(s) have been discussed and reviewed. All questions answered. The patient agrees to proceed.   Regarding this episodic diarrhea I do not think it meets criteria for a colonoscopy based upon the overall clinical history though it could depending upon clinical course.  She and I both think she might be lactose intolerant so she will avoid all milk products and I will regroup on this.  I think pelvic floor physical therapy could be quite helpful for her regarding bowel issues specifically fecal incontinence as well as the urinary incontinence, so we will make a referral.  I appreciate the opportunity to care for this patient. 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  She has been having increasing heartburn problems, especially at night.  She did increase the amount of time between last meal and bedtime and that is better though still a problem.  She relates some of it to starting amlodipine for tension.  Since discontinuing amlodipine things are a bit better but despite using Pepcid occasionally as recommended by her previous primary care provider in [place] she still does not feel right and she is having intermittent solid food  dysphagia at least several times a month.  She has never had an upper GI endoscopy.  For years now she will have 4 or 5 episodes a month of urgent often postprandial defecation which is watery.  "I completely empty".  Because of this and seepage of stool and leakage of urine she wears pads.  Colonoscopy remotely I believe she did a Cologuard in 2018 that was negative.  She denies any bleeding problems.  This has been ongoing for some time.  It is not a change.  She does question if she might be lactose intolerant.  She thinks but is not sure when she eats ice cream she will have these problems.  She was told she had a fatty liver after an ultrasound in 2019 records reviewed, and she has a single gallstone as well.  She used to drink quite a bit she thinks several cocktails a day she and her former husband used to do many cruises and she enjoyed cocktails but she stopped drinking when she heard she had fatty liver and has not really had anything since.  She has received both coronavirus vaccines most recent 1 02/04/2020 Lab Results  Component Value Date   WBC 6.6 12/08/2019   HGB 13.7 12/08/2019   HCT 41.7 12/08/2019   MCV 92.9 12/08/2019   PLT 253 12/08/2019   Lab Results  Component Value Date   CREATININE 1.41 (H) 12/08/2019   BUN 24 12/08/2019   NA 143 12/08/2019   K 4.4 12/08/2019   CL 106 12/08/2019   CO2 24 12/08/2019   Lab  Results  Component Value Date   ALT 14 12/08/2019   AST 16 12/08/2019   BILITOT 0.4 12/08/2019    Reviewed recent PCP notes late 2020 Allergies  Allergen Reactions  . Other     Wood - burning fireplaces   Current Meds  Medication Sig  . albuterol (VENTOLIN HFA) 108 (90 Base) MCG/ACT inhaler Inhale 2 puffs into the lungs every 6 (six) hours as needed for wheezing or shortness of breath. (Patient taking differently: Inhale 2 puffs into the lungs as needed for wheezing or shortness of breath. )  . allopurinol (ZYLOPRIM) 100 MG tablet Take 1 tablet (100 mg  total) by mouth daily.  Marland Kitchen aspirin 81 MG EC tablet Take 81 mg by mouth daily. Swallow whole.  Marland Kitchen atorvastatin (LIPITOR) 10 MG tablet Take 1 tablet (10 mg total) by mouth daily.  . budesonide-formoterol (SYMBICORT) 160-4.5 MCG/ACT inhaler Inhale 2 puffs into the lungs 2 (two) times daily. (Patient taking differently: Inhale 2 puffs into the lungs as needed. )  . clobetasol ointment (TEMOVATE) 0.05 % Apply 1 a small amount to affected area twice a day  Apply to legs as needed for itching  . furosemide (LASIX) 20 MG tablet Take 1 tablet (20 mg total) by mouth daily.  Marland Kitchen ketoconazole (NIZORAL) 2 % shampoo SHAMPOO/WASH SCALP WITH A SMALL AMOUNT EVERY DAY UNTIL SYMPTOMS RELIEVE  . levothyroxine (SYNTHROID) 100 MCG tablet TAKE 1 TABLET BY MOUTH EVERY DAY BEFORE BREAKFAST  . losartan (COZAAR) 50 MG tablet Take 1 tablet (50 mg total) by mouth daily.  . montelukast (SINGULAIR) 10 MG tablet Take 1 tablet (10 mg total) by mouth at bedtime.  . Multiple Vitamin (MULTIVITAMIN) tablet Take 1 tablet by mouth daily.  Marland Kitchen omeprazole (PRILOSEC OTC) 20 MG tablet Take 20 mg by mouth as needed.  . traMADol (ULTRAM) 50 MG tablet TAKE 1 TABLET BY MOUTH EVERY 6 HOURS AS NEEDED  . triamcinolone ointment (KENALOG) 0.1 % SMARTSIG:1 Sparingly Topical Twice Daily PRN   Past Medical History:  Diagnosis Date  . Arthritis   . DDD (degenerative disc disease), lumbar 11/24/2018  . Dizziness   . Gallstones   . Hearing loss   . History of stroke involving cerebellum 10/13/2018  . Hypercholesteremia   . Hypertension   . Hypothyroidism   . 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  . Migraines   . Mild intermittent asthma without complication 02/11/2019  . Primary osteoarthritis of both feet 11/24/2018  . Rheumatoid arthritis (HCC) 07/04/2018  . Stage 3 chronic kidney disease 02/11/2019  . Vitamin D deficiency 02/11/2019   Past Surgical History:  Procedure Laterality Date  . ABDOMINAL HYSTERECTOMY    . BREAST REDUCTION SURGERY  Bilateral   . CATARACT EXTRACTION, BILATERAL    . INCISION AND DRAINAGE BREAST ABSCESS Left   . REPLACEMENT TOTAL KNEE Bilateral   . TONSILLECTOMY AND ADENOIDECTOMY     Social History   Social History Narrative   Lives alone.  The patient is widowed.   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   1 cup coffee per day.   No alcohol   1 son as above   Former smoker   family history includes Arthritis in her mother and sister; Bipolar disorder in her son; GER disease in her maternal grandmother; Gallbladder disease in her maternal grandmother; Healthy in her son; Heart  disease in her father and paternal uncle; Hyperlipidemia in her mother; Stroke in her mother.   Review of Systems As per HPI.  Also some vertigo decreased hearing night sweats some dyspnea depressed mood fatigue back pain all other review of systems are negative  Objective:   Physical Exam @BP  130/70 (BP Location: Left Arm, Patient Position: Sitting, Cuff Size: Normal)   Pulse 80   Temp 99 F (37.2 C)   Ht 5' 2.75" (1.594 m) Comment: height measured without shoes  Wt 195 lb 2 oz (88.5 kg)   BMI 34.84 kg/m @  General:  Well-developed, well-nourished and in no acute distress Eyes:  anicteric. Neck:   supple w/o thyromegaly or mass.  Lungs: Clear to auscultation bilaterally. Heart:  S1S2, no rubs, murmurs, gallops. Abdomen:  obese soft, mildly diffusely-tender, no hepatosplenomegaly, hernia, or mass and BS+. Some prominence of abd wall vv Rectal:  Patti , CMA present.  Soft feces in anoderm Small anal tags  Decreased resting and voluntary tone Soft brown stool No mass/rectocele  approp abd CTR, increased descent  Lymph:  no cervical or supraclavicular adenopathy. Skin  Senile purpura. Neuro:  A&O x 3.  Psych:  appropriate mood and  Affect.   Data Reviewed: I have  reviewed recent primary care notes labs in the computer see HPI also ultrasound report from 01/27/2018 [place] 03/27/[place] Cologuard test 2018, negative

## 2020-03-07 ENCOUNTER — Ambulatory Visit (AMBULATORY_SURGERY_CENTER): Payer: Medicare Other | Admitting: Internal Medicine

## 2020-03-07 ENCOUNTER — Encounter: Payer: Self-pay | Admitting: Internal Medicine

## 2020-03-07 ENCOUNTER — Ambulatory Visit: Payer: Medicare Other | Admitting: Family Medicine

## 2020-03-07 ENCOUNTER — Other Ambulatory Visit: Payer: Self-pay

## 2020-03-07 VITALS — BP 148/76 | HR 60 | Temp 98.2°F | Resp 13 | Ht 62.75 in | Wt 195.0 lb

## 2020-03-07 DIAGNOSIS — K317 Polyp of stomach and duodenum: Secondary | ICD-10-CM | POA: Diagnosis not present

## 2020-03-07 DIAGNOSIS — K222 Esophageal obstruction: Secondary | ICD-10-CM | POA: Diagnosis not present

## 2020-03-07 DIAGNOSIS — R1319 Other dysphagia: Secondary | ICD-10-CM

## 2020-03-07 DIAGNOSIS — K2950 Unspecified chronic gastritis without bleeding: Secondary | ICD-10-CM

## 2020-03-07 DIAGNOSIS — R1310 Dysphagia, unspecified: Secondary | ICD-10-CM

## 2020-03-07 DIAGNOSIS — K3189 Other diseases of stomach and duodenum: Secondary | ICD-10-CM | POA: Diagnosis not present

## 2020-03-07 DIAGNOSIS — K219 Gastro-esophageal reflux disease without esophagitis: Secondary | ICD-10-CM

## 2020-03-07 MED ORDER — OMEPRAZOLE 40 MG PO CPDR: 40.0000 mg | DELAYED_RELEASE_CAPSULE | Freq: Every day | ORAL | 3 refills | Status: DC

## 2020-03-07 MED ORDER — SODIUM CHLORIDE 0.9 % IV SOLN: 500.0000 mL | Freq: Once | INTRAVENOUS | Status: DC

## 2020-03-07 NOTE — Progress Notes (Signed)
Called to room to assist during endoscopic procedure.  Patient ID and intended procedure confirmed with present staff. Received instructions for my participation in the procedure from the performing physician.  

## 2020-03-07 NOTE — Op Note (Signed)
[facility] Patient Name: Alexis Shelton Procedure Date: 03/07/2020 1:28 PM MRN: [id number] Endoscopist: Iva Boop , MD Age: 79 Referring MD:  Date of Birth: January 20, 1941 Gender: Female Account #: [id number] Procedure:                Upper GI endoscopy Indications:              Epigastric abdominal pain, Abdominal pain in the                            left upper quadrant, Dysphagia Medicines:                Propofol per Anesthesia, Monitored Anesthesia Care Procedure:                Pre-Anesthesia Assessment:                           - Prior to the procedure, a History and Physical                            was performed, and patient medications and                            allergies were reviewed. The patient's tolerance of                            previous anesthesia was also reviewed. The risks                            and benefits of the procedure and the sedation                            options and risks were discussed with the patient.                            All questions were answered, and informed consent                            was obtained. Prior Anticoagulants: The patient has                            taken no previous anticoagulant or antiplatelet                            agents. ASA Grade Assessment: III - A patient with                            severe systemic disease. After reviewing the risks                            and benefits, the patient was deemed in                            satisfactory condition to undergo the procedure.  After obtaining informed consent, the endoscope was                            passed under direct vision. Throughout the                            procedure, the patient's blood pressure, pulse, and                            oxygen saturations were monitored continuously. The                            Endoscope was introduced through the mouth, and   advanced to the second part of duodenum. The upper                            GI endoscopy was accomplished without difficulty.                            The patient tolerated the procedure well. Scope In: Scope Out: Findings:                 One benign-appearing, intrinsic moderate                            (circumferential scarring or stenosis; an endoscope                            may pass) stenosis was found at the                            gastroesophageal junction. The stenosis was                            traversed. A TTS dilator was passed through the                            scope. Dilation with an 18-19-20 mm balloon dilator                            was performed to 20 mm. The dilation site was                            examined and showed mild mucosal disruption.                            Estimated blood loss was minimal.                           A small hiatal hernia was present.                           A single 7 mm Flat polyp with no stigmata of recent  bleeding was found in the prepyloric region of the                            stomach. Biopsies were taken with a cold forceps                            for histology. Verification of patient                            identification for the specimen was done. Estimated                            blood loss was minimal.                           Patchy mild inflammation characterized by                            congestion (edema), erythema and friability was                            found in the gastric antrum. Biopsies were taken                            with a cold forceps for histology. Verification of                            patient identification for the specimen was done.                            Estimated blood loss was minimal.                           The cardia and gastric fundus were normal on                            retroflexion. Complications:             No immediate complications. Estimated Blood Loss:     Estimated blood loss was minimal. Impression:               - Benign-appearing esophageal stenosis. Dilated.                           - Small hiatal hernia.                           - A single gastric polyp. Biopsied.                           - Gastritis. Biopsied. Recommendation:           - Patient has a contact number available for                            emergencies. The signs and symptoms of potential  delayed complications were discussed with the                            patient. Return to normal activities tomorrow.                            Written discharge instructions were provided to the                            patient.                           - Clear liquids x 1 hour then soft foods rest of                            day. Start prior diet tomorrow.                           - Continue present medications.                           - Await pathology results.                           - restart PPI qd (omeprazole)                           may need Ct abdomen (LUQ tenderness) - await path                            and restart PPI                           says she tolerated mild so probably not lactose                            intolerant as we thought Iva Boop, MD 03/07/2020 2:04:08 PM This report has been signed electronically.

## 2020-03-07 NOTE — Progress Notes (Signed)
Temp by LC, Vitals by DT   Pt's states no medical or surgical changes since previsit or office visit.  

## 2020-03-07 NOTE — Progress Notes (Signed)
PT taken to PACU. Monitors in place. VSS. Report given to RN. 

## 2020-03-07 NOTE — Patient Instructions (Addendum)
There was a stricture where esophagus and stomach meet - I dilated it to improve swallowing. Thi is usually from acid reflux so please take omeprazole every day - 30-60 mins before breakfast - regardless of whether you have heartburn or indigestion.  There was a small stomach polyp (biopsied) and some gastritis (also biopsied)  Once I get pathology results will contact you.  I appreciate the opportunity to care for you. Iva Boop, MD, FACG  YOU HAD AN ENDOSCOPIC PROCEDURE TODAY AT THE [facility]:   Refer to the procedure report that was given to you for any specific questions about what was found during the examination.  If the procedure report does not answer your questions, please call your gastroenterologist to clarify.  If you requested that your care partner not be given the details of your procedure findings, then the procedure report has been included in a sealed envelope for you to review at your convenience later.  YOU SHOULD EXPECT: Some feelings of bloating in the abdomen. Passage of more gas than usual.  Walking can help get rid of the air that was put into your GI tract during the procedure and reduce the bloating.   Please Note:  You might notice some irritation and congestion in your nose or some drainage.  This is from the oxygen used during your procedure.  There is no need for concern and it should clear up in a day or so.  SYMPTOMS TO REPORT IMMEDIATELY:   Following upper endoscopy (EGD)  Vomiting of blood or coffee ground material  New chest pain or pain under the shoulder blades  Painful or persistently difficult swallowing  New shortness of breath  Fever of 100F or higher  Black, tarry-looking stools  For urgent or emergent issues, a gastroenterologist can be reached at any hour by calling [phone]. Do not use MyChart messaging for urgent concerns.    DIET:  We do recommend the post dilation diet for today then you may proceed to your  regular diet tomorrow.  Drink plenty of fluids but you should avoid alcoholic beverages for 24 hours.  ACTIVITY:  You should plan to take it easy for the rest of today and you should NOT DRIVE or use heavy machinery until tomorrow (because of the sedation medicines used during the test).    FOLLOW UP: Our staff will call the number listed on your records 48-72 hours following your procedure to check on you and address any questions or concerns that you may have regarding the information given to you following your procedure. If we do not reach you, we will leave a message.  We will attempt to reach you two times.  During this call, we will ask if you have developed any symptoms of COVID 19. If you develop any symptoms (ie: fever, flu-like symptoms, shortness of breath, cough etc.) before then, please call [phone].  If you test positive for Covid 19 in the 2 weeks post procedure, please call and report this information to [place].    If any biopsies were taken you will be contacted by phone or by letter within the next 1-3 weeks.  Please call us at [phone] if you have not heard about the biopsies in 3 weeks.    SIGNATURES/CONFIDENTIALITY: You and/or your care partner have signed paperwork which will be entered into your electronic medical record.  These signatures attest to the fact that that the information above on your After Visit Summary has been reviewed  and is understood.  Full responsibility of the confidentiality of this discharge information lies with you and/or your care-partner. 

## 2020-03-08 ENCOUNTER — Ambulatory Visit: Payer: Medicare Other | Admitting: Physical Therapy

## 2020-03-09 ENCOUNTER — Telehealth: Payer: Self-pay

## 2020-03-09 NOTE — Telephone Encounter (Signed)
  Follow up Call-  Call back number 03/07/2020  Post procedure Call Back phone  # [phone]  Permission to leave phone message Yes  Some recent data might be hidden     Patient questions:  Do you have a fever, pain , or abdominal swelling? yes Pain Score  5  Have you tolerated food without any problems? No.  Have you been able to return to your normal activities? No.  Do you have any questions about your discharge instructions: Diet   No. Medications  No. Follow up visit  No.  Do you have questions or concerns about your Care? Yes.    Actions: * If pain score is 4 or above: Physician/ provider Notified : Stan Head, MD   States pain on left side up into bottom of her ribs area. No vomiting, c/o nausea, still taking omeprazole, states her back is also hurting which she states is also due to arthritis, states using heating pad.  Pt would like a call to see if she needs to follow up with anyone.

## 2020-03-09 NOTE — Telephone Encounter (Signed)
I spoke with Alexis Shelton and she said she took a Tramadol and now her pain is gone. I gave her the message as Dr Leone Payor instructed. She will await the biopsy results.

## 2020-03-09 NOTE — Telephone Encounter (Signed)
The pain sounds like what she had prior to the EGD and I think is musculoskeletal (she can see PCP for that)  Once I see her biopsy results will coordinate follow-up for GI

## 2020-03-14 ENCOUNTER — Encounter: Payer: Self-pay | Admitting: Family Medicine

## 2020-03-14 ENCOUNTER — Other Ambulatory Visit: Payer: Self-pay

## 2020-03-14 ENCOUNTER — Ambulatory Visit (INDEPENDENT_AMBULATORY_CARE_PROVIDER_SITE_OTHER): Payer: Medicare Other | Admitting: Family Medicine

## 2020-03-14 VITALS — BP 158/71 | HR 67

## 2020-03-14 DIAGNOSIS — I10 Essential (primary) hypertension: Secondary | ICD-10-CM | POA: Diagnosis not present

## 2020-03-14 NOTE — Progress Notes (Signed)
Office Visit Note   Patient: Alexis Shelton           Date of Birth: 05-Sep-1941           MRN: [id number] Visit Date: 03/14/2020 Requested by: Eunice Blase, MD [address] [place],  [place] [address] PCP: Eunice Blase, MD  Subjective: Chief Complaint  Patient presents with  . Blood Pressure Check    HPI: She is here for blood pressure follow-up.  She has been doing well on losartan 50 mg daily.  She thinks that higher dosage of that along with amlodipine was causing a lot of her abdominal issues.  Abdominal pain seems to be much better now and her blood pressure readings are in normal range when she checks them at home.  Doing well with occasional tramadol for her chronic pain.  Usually once daily is enough, sometimes twice.  She will be needing a refill pretty soon.              ROS:   All other systems were reviewed and are negative.  Objective: Vital Signs: BP (!) 158/71   Pulse 67   Physical Exam:  General:  Alert and oriented, in no acute distress. Pulm:  Breathing unlabored. Psy:  Normal mood, congruent affect.  CV: Regular rate and rhythm without murmurs, rubs, or gallops.  No peripheral edema.  2+ radial and posterior tibial pulses. Lungs: Clear to auscultation throughout with no wheezing or areas of consolidation.    Imaging: None today  Assessment & Plan: 1.  Hypertension, now under better control -No changes today.  Follow-up in 3 months for wellness examination with labs.  Call for medicine refills when needed.     Procedures: No procedures performed  No notes on file     PMFS History: Patient Active Problem List   Diagnosis Date Noted  . Vitamin D deficiency 02/11/2019  . Stage 3 chronic kidney disease 02/11/2019  . Mild intermittent asthma without complication 73/71/0626  . Primary osteoarthritis of both hands 11/24/2018  . Primary osteoarthritis of both feet 11/24/2018  . DDD (degenerative disc disease), cervical 11/24/2018  . DDD  (degenerative disc disease), lumbar 11/24/2018  . History of stroke involving cerebellum 10/13/2018  . Gait abnormality 10/13/2018  . 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  . Asthma 08/12/2018  . Hypothyroidism 08/12/2018  . Rheumatoid arthritis ([place]) 07/04/2018  . Essential hypertension 07/04/2018  . Hyperlipidemia 07/04/2018   Past Medical History:  Diagnosis Date  . Arthritis   . DDD (degenerative disc disease), lumbar 11/24/2018  . Dizziness   . Gallstones   . Hearing loss   . History of stroke involving cerebellum 10/13/2018  . Hypercholesteremia   . Hypertension   . Hypothyroidism   . 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  . Migraines   . Mild intermittent asthma without complication 9/48/5462  . Primary osteoarthritis of both feet 11/24/2018  . Rheumatoid arthritis ([place]) 07/04/2018  . Stage 3 chronic kidney disease 02/11/2019  . Vitamin D deficiency 02/11/2019    Family History  Problem Relation Age of Onset  . Stroke Mother   . Hyperlipidemia Mother   . Arthritis Mother   . Heart disease Father   . Arthritis Sister   . Bipolar disorder Son   . Healthy Son   . Gallbladder disease Maternal Grandmother        radiation  . GER disease Maternal Grandmother   . Heart disease Paternal Uncle  x 7  . Colon cancer Neg Hx   . Esophageal cancer Neg Hx   . Rectal cancer Neg Hx   . Stomach cancer Neg Hx     Past Surgical History:  Procedure Laterality Date  . ABDOMINAL HYSTERECTOMY    . BREAST REDUCTION SURGERY Bilateral   . CATARACT EXTRACTION, BILATERAL    . INCISION AND DRAINAGE BREAST ABSCESS Left   . REPLACEMENT TOTAL KNEE Bilateral   . TONSILLECTOMY AND ADENOIDECTOMY     Social History   Occupational History  . Occupation: Retired - Sales promotion account executive    Comment: [employer]  Tobacco Use  . Smoking status: Former Smoker    Types: Cigarettes    Quit date: 1970    Years since quitting: 51.2  . Smokeless tobacco: Never Used  Substance and Sexual  Activity  . Alcohol use: Not Currently  . Drug use: Never  . Sexual activity: Not on file

## 2020-03-16 ENCOUNTER — Other Ambulatory Visit: Payer: Self-pay | Admitting: *Deleted

## 2020-03-16 DIAGNOSIS — R1012 Left upper quadrant pain: Secondary | ICD-10-CM

## 2020-03-22 ENCOUNTER — Other Ambulatory Visit: Payer: Self-pay | Admitting: *Deleted

## 2020-03-22 ENCOUNTER — Telehealth: Payer: Self-pay | Admitting: Internal Medicine

## 2020-03-22 DIAGNOSIS — R1012 Left upper quadrant pain: Secondary | ICD-10-CM

## 2020-03-22 NOTE — Telephone Encounter (Signed)
Called patient and left a message for her to call back. Will attempt to get her to come in for labs prior to 4/2 (CT).   Per Rebecka Apley, order placed in Epic for I-STAT creatinine if unable to reach the patient or if patient is unable to come in for labs prior to 4/2. Note on CT appt to NOT cancel appt because of labs.

## 2020-03-23 NOTE — Telephone Encounter (Signed)
Left message for patient to call back  

## 2020-03-24 ENCOUNTER — Other Ambulatory Visit (INDEPENDENT_AMBULATORY_CARE_PROVIDER_SITE_OTHER): Payer: Medicare Other

## 2020-03-24 ENCOUNTER — Other Ambulatory Visit: Payer: Self-pay

## 2020-03-24 DIAGNOSIS — R1012 Left upper quadrant pain: Secondary | ICD-10-CM

## 2020-03-24 LAB — BUN: BUN: 32 mg/dL — ABNORMAL HIGH (ref 6–23)

## 2020-03-24 LAB — CREATININE, SERUM: Creatinine, Ser: 1.49 mg/dL — ABNORMAL HIGH (ref 0.40–1.20)

## 2020-03-25 ENCOUNTER — Ambulatory Visit (HOSPITAL_COMMUNITY)
Admission: RE | Admit: 2020-03-25 | Discharge: 2020-03-25 | Disposition: A | Discharge status: Home / Self Care | Payer: Medicare Other | Source: Ambulatory Visit | Attending: Internal Medicine | Admitting: Internal Medicine

## 2020-03-25 ENCOUNTER — Other Ambulatory Visit: Payer: Self-pay

## 2020-03-25 DIAGNOSIS — R1012 Left upper quadrant pain: Secondary | ICD-10-CM | POA: Diagnosis present

## 2020-03-25 MED ORDER — SODIUM CHLORIDE (PF) 0.9 % IJ SOLN
INTRAMUSCULAR | Status: AC
  Filled 2020-03-25: qty 50

## 2020-03-25 MED ORDER — IOHEXOL 300 MG/ML  SOLN
75.0000 mL | Freq: Once | INTRAMUSCULAR | Status: AC | PRN
  Administered 2020-03-25: 75 mL via INTRAVENOUS

## 2020-03-29 ENCOUNTER — Ambulatory Visit: Payer: Medicare Other | Attending: Internal Medicine | Admitting: Physical Therapy

## 2020-03-29 ENCOUNTER — Encounter: Payer: Self-pay | Admitting: Physical Therapy

## 2020-03-29 ENCOUNTER — Other Ambulatory Visit: Payer: Self-pay

## 2020-03-29 DIAGNOSIS — R278 Other lack of coordination: Secondary | ICD-10-CM

## 2020-03-29 DIAGNOSIS — N39498 Other specified urinary incontinence: Secondary | ICD-10-CM

## 2020-03-29 DIAGNOSIS — R159 Full incontinence of feces: Secondary | ICD-10-CM

## 2020-03-29 DIAGNOSIS — M6281 Muscle weakness (generalized): Secondary | ICD-10-CM

## 2020-03-29 NOTE — Therapy (Signed)
[facility] [address], [place], [place], [address] Phone: [phone]   Fax:  [phone]  Physical Therapy Evaluation  Patient Details  Name: Alexis Shelton MRN: [id number] Date of Birth: Apr 11, 1941 Referring Provider (PT): Dr. Silvano Rusk   Encounter Date: 03/29/2020  PT End of Session - 03/29/20 1504    Visit Number  1    Date for PT Re-Evaluation  05/24/20    Authorization Type  medicare    PT Start Time  9892    PT Stop Time  1520    PT Time Calculation (min)  35 min    Activity Tolerance  Patient tolerated treatment well    Behavior During Therapy  [facility] for tasks assessed/performed       Past Medical History:  Diagnosis Date  . Arthritis   . DDD (degenerative disc disease), lumbar 11/24/2018  . Dizziness   . Gallstones   . Hearing loss   . History of stroke involving cerebellum 10/13/2018  . Hypercholesteremia   . Hypertension   . Hypothyroidism   . 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  . Migraines   . Mild intermittent asthma without complication 01/11/4173  . Primary osteoarthritis of both feet 11/24/2018  . Rheumatoid arthritis ([place]) 07/04/2018  . Stage 3 chronic kidney disease 02/11/2019  . Vitamin D deficiency 02/11/2019    Past Surgical History:  Procedure Laterality Date  . ABDOMINAL HYSTERECTOMY    . BREAST REDUCTION SURGERY Bilateral   . CATARACT EXTRACTION, BILATERAL    . INCISION AND DRAINAGE BREAST ABSCESS Left   . REPLACEMENT TOTAL KNEE Bilateral   . TONSILLECTOMY AND ADENOIDECTOMY      There were no vitals filed for this visit.   Subjective Assessment - 03/29/20 1451    Subjective  Patient had a full term vaginal birth that was difficult. Patient had a prolapse repair in the past. Patient reports she has a prolapse now. Patient has to wear a pad at all the time. Patient will have a bowel movement and not realize she had. Patient is having intestinal issues that cause  diarrhea.    Patient Stated Goals  work on urinary and fecal control    Currently in Pain?  Yes    Pain Score  5     Pain Location  Abdomen    Pain Orientation  Mid;Upper;Left    Pain Descriptors / Indicators  Sharp    Pain Type  Chronic pain    Pain Onset  More than a month ago    Pain Frequency  Intermittent    Aggravating Factors   pressure is applied to the abdomen,    Pain Relieving Factors  wears loose fitting clothes    Multiple Pain Sites  No         [facility] PT Assessment - 03/29/20 0001      Assessment   Medical Diagnosis  R32, R15.9 Urinary and fecal incontinence    Referring Provider (PT)  Dr. Silvano Rusk    Onset Date/Surgical Date  --   chronic   Prior Therapy  none      Precautions   Precautions  Other (comment)    Precaution Comments  Rhuematoid Arthritis, vertigo      Restrictions   Weight Bearing Restrictions  No      Balance Screen   Has the patient fallen in the past 6 months  No    Has the patient had a decrease in activity level because  of a fear of falling?   Yes   patient has Vertigo, would like balance therapy   Is the patient reluctant to leave their home because of a fear of falling?   Yes      Home Public house manager residence      Prior Function   Level of Independence  Independent    Vocation  Retired      IT consultant   Overall Cognitive Status  Within Functional Limits for tasks assessed      Posture/Postural Control   Posture/Postural Control  Postural limitations      ROM / Strength   AROM / PROM / Strength  AROM;PROM;Strength      AROM   Overall AROM Comments  lumbar ROM limited by 50% due to pain and Vertigo      Strength   Overall Strength Comments  bilateral hip strength 5/5      Palpation   Palpation comment  abdomen is tendern, tight and large                Objective measurements completed on examination: See above findings.    Pelvic Floor Special Questions - 03/29/20 0001     Prior Pregnancies  Yes    Number of Vaginal Deliveries  1    Any difficulty with labor and deliveries  Yes    Urinary Leakage  Yes    Pad use  2-3 pads during the night; 1 overnight pad    Activities that cause leaking  Sneezing;Coughing    Fecal incontinence  Yes   does not feel it   Pelvic Floor Internal Exam  Patient confirms identification and approves PT to assess pelvic floor and treatment    Exam Type  Vaginal;Rectal    Palpation  decreased anterior contraction of the rectum, vaginal contraction is circular ; tenderness located in the posterior skin    Strength  weak squeeze, no lift   rectal and vaginal                PT Short Term Goals - 03/29/20 1616      PT SHORT TERM GOAL #1   Title  independent with initial HEP    Time  4    Period  Weeks    Status  New    Target Date  04/26/20      PT SHORT TERM GOAL #2   Title  able to feel the stool leakage due to increased  pelvic floor awareness    Time  4    Period  Weeks    Status  New    Target Date  04/26/20      PT SHORT TERM GOAL #3   Title  understand how to use vaginal moisturizers to reduce dryness    Time  4    Period  Weeks    Status  New    Target Date  04/26/20      PT SHORT TERM GOAL #4   Title  understand correct toileting technique to reduce strain on the pelvic floor    Time  4    Period  Weeks    Status  New    Target Date  04/26/20        PT Long Term Goals - 03/29/20 1619      PT LONG TERM GOAL #1   Title  independent with advanced HEP    Time  8    Period  Weeks  Status  New    Target Date  04/26/20      PT LONG TERM GOAL #2   Title  pelvic floor strength rectally and vaginally >/= 3/5 holding for 15 seconds    Time  8    Period  Weeks    Status  New    Target Date  05/24/20      PT LONG TERM GOAL #3   Title  fecal leakage decreased >/= 60% due to improve strength and tone of the anal sphincter muscles    Time  8    Period  Weeks    Status  New    Target Date   05/24/20      PT LONG TERM GOAL #4   Title  urinary leakage decreased >/= 60% due to improve tone of pelvic floor    Time  8    Period  Weeks    Status  New    Target Date  05/24/20             Plan - 03/29/20 1528    Clinical Impression Statement  Patient is a 79 year old female with urinary and fecal incontinence for many years. Patient reports she will leak urine at night and with coughing. Patient will leak stool and not feel it. Patient wears 2 pads during the day. Patient wears 1 pad during the night and it is very wet. Patient has tenderness located in the abdomen and tightness. Patient has low tone of the pelvic floor. Pelvic floor strength is 2/5. Patient is not able to contract the anterior pelvic floor with anal sphincter. Patient is not pulling up the pelvic floor with her contraction and she will hold her breath. Patient will bulge her abdomen with pelvic floor contraction. Patient will benefit from skilled therapy improve pelvic floor strength and coordination to reduce the leakage and improve her awareness.    Personal Factors and Comorbidities  Fitness;Age;Time since onset of injury/illness/exacerbation;Comorbidity 3+    Comorbidities  abdominal hysterectomy; RA; Stroke    Examination-Activity Limitations  Continence;Hygiene/Grooming;Toileting    Examination-Participation Restrictions  Community Activity    Stability/Clinical Decision Making  Evolving/Moderate complexity    Clinical Decision Making  Moderate    Rehab Potential  Good    PT Frequency  2x / week    PT Duration  8 weeks    PT Treatment/Interventions  Biofeedback;Cryotherapy;Electrical Stimulation;Moist Heat;Neuromuscular re-education;Balance training;Therapeutic exercise;Therapeutic activities;Patient/family education;Manual techniques    PT Next Visit Plan  working on abdominal massage, work on rib mobility, work on breathing to not put pressure down on the pelvic floor, pelvic floor contraction awareness;  bowel health    Consulted and Agree with Plan of Care  Patient       Patient will benefit from skilled therapeutic intervention in order to improve the following deficits and impairments:  Decreased coordination, Decreased range of motion, Increased fascial restricitons, Decreased strength, Decreased activity tolerance  Visit Diagnosis: Muscle weakness (generalized) - Plan: PT plan of care cert/re-cert  Other lack of coordination - Plan: PT plan of care cert/re-cert  Incontinence of feces, unspecified fecal incontinence type - Plan: PT plan of care cert/re-cert  Other urinary incontinence - Plan: PT plan of care cert/re-cert     Problem List Patient Active Problem List   Diagnosis Date Noted  . Vitamin D deficiency 02/11/2019  . Stage 3 chronic kidney disease 02/11/2019  . Mild intermittent asthma without complication 02/11/2019  . Primary osteoarthritis of both hands 11/24/2018  .  Primary osteoarthritis of both feet 11/24/2018  . DDD (degenerative disc disease), cervical 11/24/2018  . DDD (degenerative disc disease), lumbar 11/24/2018  . History of stroke involving cerebellum 10/13/2018  . Gait abnormality 10/13/2018  . 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  . Asthma 08/12/2018  . Hypothyroidism 08/12/2018  . Rheumatoid arthritis ([facility]) 07/04/2018  . Essential hypertension 07/04/2018  . Hyperlipidemia 07/04/2018    Eulis Foster, PT 03/29/20 5:20 PM   [facility] [address], [address] [place], [place], [address] Phone: [phone]   Fax:  [phone]  Name: Alexis Shelton MRN: [id number] Date of Birth: 1941/06/07

## 2020-04-09 ENCOUNTER — Other Ambulatory Visit: Payer: Self-pay | Admitting: Family Medicine

## 2020-04-13 ENCOUNTER — Other Ambulatory Visit: Payer: Self-pay

## 2020-04-13 ENCOUNTER — Ambulatory Visit (INDEPENDENT_AMBULATORY_CARE_PROVIDER_SITE_OTHER): Payer: Medicare Other | Admitting: Family Medicine

## 2020-04-13 VITALS — BP 164/76 | HR 67

## 2020-04-13 DIAGNOSIS — R109 Unspecified abdominal pain: Secondary | ICD-10-CM | POA: Diagnosis not present

## 2020-04-13 DIAGNOSIS — N183 Chronic kidney disease, stage 3 unspecified: Secondary | ICD-10-CM | POA: Diagnosis not present

## 2020-04-13 DIAGNOSIS — R82998 Other abnormal findings in urine: Secondary | ICD-10-CM | POA: Diagnosis not present

## 2020-04-13 DIAGNOSIS — I10 Essential (primary) hypertension: Secondary | ICD-10-CM

## 2020-04-13 DIAGNOSIS — R739 Hyperglycemia, unspecified: Secondary | ICD-10-CM

## 2020-04-13 DIAGNOSIS — R197 Diarrhea, unspecified: Secondary | ICD-10-CM

## 2020-04-13 DIAGNOSIS — R1110 Vomiting, unspecified: Secondary | ICD-10-CM | POA: Diagnosis not present

## 2020-04-13 DIAGNOSIS — Z0000 Encounter for general adult medical examination without abnormal findings: Secondary | ICD-10-CM

## 2020-04-13 DIAGNOSIS — E559 Vitamin D deficiency, unspecified: Secondary | ICD-10-CM

## 2020-04-13 DIAGNOSIS — E785 Hyperlipidemia, unspecified: Secondary | ICD-10-CM

## 2020-04-13 DIAGNOSIS — E039 Hypothyroidism, unspecified: Secondary | ICD-10-CM

## 2020-04-13 DIAGNOSIS — M1A09X0 Idiopathic chronic gout, multiple sites, without tophus (tophi): Secondary | ICD-10-CM

## 2020-04-13 NOTE — Progress Notes (Signed)
Office Visit Note   Patient: Alexis Shelton           Date of Birth: 1941/11/24           MRN: [id number] Visit Date: 04/13/2020 Requested by: Eunice Blase, MD [address] [place],  [place] [address] PCP: Eunice Blase, MD  Subjective: Chief Complaint  Patient presents with  . Abdominal Pain    HPI: She is here with dark-colored urine.  2 days ago she ate some chicken salad.  About an hour later she had abdominal pain followed by vomiting.  The next morning she had diarrhea.  After that, she felt much better but since then she has had dark-colored urine.  She is asymptomatic from a urination standpoint.  No fevers or chills.  She does complain of decreased appetite.  She has a history of chronic kidney disease.  She is scheduled for a wellness exam in the near future.  Since she is fasting this morning, she would like to have labs drawn in anticipation of that.              ROS:   All other systems were reviewed and are negative.  Objective: Vital Signs: BP (!) 164/76   Pulse 67   Physical Exam:  General:  Alert and oriented, in no acute distress. Pulm:  Breathing unlabored. Psy:  Normal mood, congruent affect. Skin: No rash Abdomen: Bowel sounds are active, no hepatosplenomegaly or masses.  No significant tenderness.  No CVA tenderness.  Imaging: No results found.  Assessment & Plan: 1.  Resolved gastroenteritis, probable food poisoning -We will draw labs including urinalysis.  2.  Wellness examination with multiple medical conditions which are stable -Labs for this, follow-up in the near future.     Procedures: No procedures performed  No notes on file     PMFS History: Patient Active Problem List   Diagnosis Date Noted  . Vitamin D deficiency 02/11/2019  . Stage 3 chronic kidney disease 02/11/2019  . Mild intermittent asthma without complication 73/41/9379  . Primary osteoarthritis of both hands 11/24/2018  . Primary osteoarthritis of both feet  11/24/2018  . DDD (degenerative disc disease), cervical 11/24/2018  . DDD (degenerative disc disease), lumbar 11/24/2018  . History of stroke involving cerebellum 10/13/2018  . Gait abnormality 10/13/2018  . 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  . Asthma 08/12/2018  . Hypothyroidism 08/12/2018  . Rheumatoid arthritis ([place]) 07/04/2018  . Essential hypertension 07/04/2018  . Hyperlipidemia 07/04/2018   Past Medical History:  Diagnosis Date  . Arthritis   . DDD (degenerative disc disease), lumbar 11/24/2018  . Dizziness   . Gallstones   . Hearing loss   . History of stroke involving cerebellum 10/13/2018  . Hypercholesteremia   . Hypertension   . Hypothyroidism   . 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  . Migraines   . Mild intermittent asthma without complication 0/24/0973  . Primary osteoarthritis of both feet 11/24/2018  . Rheumatoid arthritis ([place]) 07/04/2018  . Stage 3 chronic kidney disease 02/11/2019  . Vitamin D deficiency 02/11/2019    Family History  Problem Relation Age of Onset  . Stroke Mother   . Hyperlipidemia Mother   . Arthritis Mother   . Heart disease Father   . Arthritis Sister   . Bipolar disorder Son   . Healthy Son   . Gallbladder disease Maternal Grandmother        radiation  . GER disease Maternal Grandmother   . Heart  disease Paternal Uncle        x 7  . Colon cancer Neg Hx   . Esophageal cancer Neg Hx   . Rectal cancer Neg Hx   . Stomach cancer Neg Hx     Past Surgical History:  Procedure Laterality Date  . ABDOMINAL HYSTERECTOMY    . BREAST REDUCTION SURGERY Bilateral   . CATARACT EXTRACTION, BILATERAL    . INCISION AND DRAINAGE BREAST ABSCESS Left   . REPLACEMENT TOTAL KNEE Bilateral   . TONSILLECTOMY AND ADENOIDECTOMY     Social History   Occupational History  . Occupation: Retired - Sales promotion account executive    Comment: [employer]  Tobacco Use  . Smoking status: Former Smoker    Types: Cigarettes    Quit date: 1970    Years  since quitting: 51.3  . Smokeless tobacco: Never Used  Substance and Sexual Activity  . Alcohol use: Not Currently  . Drug use: Never  . Sexual activity: Not on file

## 2020-04-13 NOTE — Progress Notes (Signed)
Had abdominal pain, vomiting and diarrhea + fever 2 days ago, after eating chicken salad that morning. Felt better the next day, but continues to have no appetite and her urine is dark. No dysuria. Patient is worried about her kidney function - BUN/creatinine have already been abnormal.

## 2020-04-15 ENCOUNTER — Ambulatory Visit (INDEPENDENT_AMBULATORY_CARE_PROVIDER_SITE_OTHER): Payer: Medicare Other

## 2020-04-15 ENCOUNTER — Other Ambulatory Visit: Payer: Self-pay

## 2020-04-15 ENCOUNTER — Telehealth: Payer: Self-pay | Admitting: Family Medicine

## 2020-04-15 ENCOUNTER — Ambulatory Visit: Payer: Medicare Other | Admitting: Family Medicine

## 2020-04-15 DIAGNOSIS — R7989 Other specified abnormal findings of blood chemistry: Secondary | ICD-10-CM

## 2020-04-15 DIAGNOSIS — N183 Chronic kidney disease, stage 3 unspecified: Secondary | ICD-10-CM

## 2020-04-15 DIAGNOSIS — R944 Abnormal results of kidney function studies: Secondary | ICD-10-CM

## 2020-04-15 DIAGNOSIS — R945 Abnormal results of liver function studies: Secondary | ICD-10-CM

## 2020-04-15 LAB — URINALYSIS W MICROSCOPIC + REFLEX CULTURE
Bacteria, UA: NONE SEEN /HPF
Bilirubin Urine: NEGATIVE
Glucose, UA: NEGATIVE
Hgb urine dipstick: NEGATIVE
Hyaline Cast: NONE SEEN /LPF
Ketones, ur: NEGATIVE
Nitrites, Initial: NEGATIVE
Protein, ur: NEGATIVE
Specific Gravity, Urine: 1.013 (ref 1.001–1.03)
Squamous Epithelial / HPF: NONE SEEN /HPF (ref <=5)
pH: <=5 (ref 5.0–8.0)

## 2020-04-15 LAB — CBC WITH DIFFERENTIAL/PLATELET
Absolute Monocytes: 774 cells/uL (ref 200–950)
Basophils Absolute: 43 cells/uL (ref 0–200)
Basophils Relative: 0.6 %
Eosinophils Absolute: 298 cells/uL (ref 15–500)
Eosinophils Relative: 4.2 %
HCT: 43.1 % (ref 35.0–45.0)
Hemoglobin: 14.1 g/dL (ref 11.7–15.5)
Lymphs Abs: 1562 cells/uL (ref 850–3900)
MCH: 29.9 pg (ref 27.0–33.0)
MCHC: 32.7 g/dL (ref 32.0–36.0)
MCV: 91.3 fL (ref 80.0–100.0)
MPV: 10.8 fL (ref 7.5–12.5)
Monocytes Relative: 10.9 %
Neutro Abs: 4423 cells/uL (ref 1500–7800)
Neutrophils Relative %: 62.3 %
Platelets: 226 10*3/uL (ref 140–400)
RBC: 4.72 10*6/uL (ref 3.80–5.10)
RDW: 15.4 % — ABNORMAL HIGH (ref 11.0–15.0)
Total Lymphocyte: 22 %
WBC: 7.1 10*3/uL (ref 3.8–10.8)

## 2020-04-15 LAB — COMPREHENSIVE METABOLIC PANEL
AG Ratio: 1.3 (calc) (ref 1.0–2.5)
ALT: 451 U/L — ABNORMAL HIGH (ref 6–29)
AST: 294 U/L — ABNORMAL HIGH (ref 10–35)
Albumin: 4 g/dL (ref 3.6–5.1)
Alkaline phosphatase (APISO): 146 U/L (ref 37–153)
BUN/Creatinine Ratio: 18 (calc) (ref 6–22)
BUN: 33 mg/dL — ABNORMAL HIGH (ref 7–25)
CO2: 27 mmol/L (ref 20–32)
Calcium: 9.4 mg/dL (ref 8.6–10.4)
Chloride: 101 mmol/L (ref 98–110)
Creat: 1.81 mg/dL — ABNORMAL HIGH (ref 0.60–0.88)
Globulin: 3.2 g/dL (calc) (ref 1.9–3.7)
Glucose, Bld: 91 mg/dL (ref 65–99)
Potassium: 4.4 mmol/L (ref 3.5–5.3)
Sodium: 137 mmol/L (ref 135–146)
Total Bilirubin: 1.3 mg/dL — ABNORMAL HIGH (ref 0.2–1.2)
Total Protein: 7.2 g/dL (ref 6.1–8.1)

## 2020-04-15 LAB — URINE CULTURE
MICRO NUMBER:: 10391336
SPECIMEN QUALITY:: ADEQUATE

## 2020-04-15 LAB — HEMOGLOBIN A1C
Hgb A1c MFr Bld: 6.4 % of total Hgb — ABNORMAL HIGH (ref <5.7)
Mean Plasma Glucose: 137 (calc)
eAG (mmol/L): 7.6 (calc)

## 2020-04-15 LAB — THYROID PANEL WITH TSH
Free Thyroxine Index: 2.2 (ref 1.4–3.8)
T3 Uptake: 31 % (ref 22–35)
T4, Total: 7.2 ug/dL (ref 5.1–11.9)
TSH: 1.86 mIU/L (ref 0.40–4.50)

## 2020-04-15 LAB — CULTURE INDICATED

## 2020-04-15 LAB — URIC ACID: Uric Acid, Serum: 6.7 mg/dL (ref 2.5–7.0)

## 2020-04-15 LAB — LIPID PANEL
Cholesterol: 177 mg/dL (ref <200)
HDL: 49 mg/dL — ABNORMAL LOW (ref >=50)
LDL Cholesterol (Calc): 108 mg/dL (calc) — ABNORMAL HIGH
Non-HDL Cholesterol (Calc): 128 mg/dL (calc) (ref <130)
Total CHOL/HDL Ratio: 3.6 (calc) (ref <5.0)
Triglycerides: 106 mg/dL (ref <150)

## 2020-04-15 LAB — VITAMIN D 25 HYDROXY (VIT D DEFICIENCY, FRACTURES): Vit D, 25-Hydroxy: 36 ng/mL (ref 30–100)

## 2020-04-15 NOTE — Telephone Encounter (Signed)
Preliminary urine studies look good.  Liver enzymes are significantly elevated.  We will recheck these and also order abdominal ultrasound and GI consult if labs remain abnormal.  Kidney function/GFR is abnormal as well, probably from dehydration due to gastroenteritis.  We will recheck basic metabolic panel as well.  Other labs look good.

## 2020-04-15 NOTE — Telephone Encounter (Signed)
I advised the patient of her lab results. Coming in today at 1:00 for repeat bloodwork.

## 2020-04-15 NOTE — Progress Notes (Signed)
Patient came in for repeat blood work: hepatic panel and BMET. Patient says she is trying to drink smoothies, as she does not have much appetite. She also mentioned a concern that her symptoms  may be due to her covid-19 vaccination Gala Murdoch). I told her I would advise Dr. Prince Rome of her concern.

## 2020-04-16 LAB — HEPATIC FUNCTION PANEL
AG Ratio: 1.4 (calc) (ref 1.0–2.5)
ALT: 229 U/L — ABNORMAL HIGH (ref 6–29)
AST: 83 U/L — ABNORMAL HIGH (ref 10–35)
Albumin: 4 g/dL (ref 3.6–5.1)
Alkaline phosphatase (APISO): 136 U/L (ref 37–153)
Bilirubin, Direct: 0.2 mg/dL (ref 0.0–0.2)
Globulin: 2.8 g/dL (calc) (ref 1.9–3.7)
Indirect Bilirubin: 0.4 mg/dL (calc) (ref 0.2–1.2)
Total Bilirubin: 0.6 mg/dL (ref 0.2–1.2)
Total Protein: 6.8 g/dL (ref 6.1–8.1)

## 2020-04-16 LAB — BASIC METABOLIC PANEL
BUN/Creatinine Ratio: 23 (calc) — ABNORMAL HIGH (ref 6–22)
BUN: 36 mg/dL — ABNORMAL HIGH (ref 7–25)
CO2: 26 mmol/L (ref 20–32)
Calcium: 9.5 mg/dL (ref 8.6–10.4)
Chloride: 106 mmol/L (ref 98–110)
Creat: 1.54 mg/dL — ABNORMAL HIGH (ref 0.60–0.88)
Glucose, Bld: 91 mg/dL (ref 65–99)
Potassium: 4.6 mmol/L (ref 3.5–5.3)
Sodium: 142 mmol/L (ref 135–146)

## 2020-04-18 ENCOUNTER — Telehealth: Payer: Self-pay | Admitting: Family Medicine

## 2020-04-18 DIAGNOSIS — R7989 Other specified abnormal findings of blood chemistry: Secondary | ICD-10-CM

## 2020-04-18 DIAGNOSIS — R944 Abnormal results of kidney function studies: Secondary | ICD-10-CM

## 2020-04-18 NOTE — Telephone Encounter (Signed)
Kidney function and liver function are improving.  We will recheck in 3 to 4 weeks to be sure everything is back in good range.

## 2020-04-19 ENCOUNTER — Encounter: Payer: Self-pay | Admitting: Physical Therapy

## 2020-04-19 ENCOUNTER — Ambulatory Visit: Payer: Medicare Other | Admitting: Physical Therapy

## 2020-04-19 ENCOUNTER — Other Ambulatory Visit: Payer: Self-pay

## 2020-04-19 DIAGNOSIS — M6281 Muscle weakness (generalized): Secondary | ICD-10-CM

## 2020-04-19 DIAGNOSIS — R159 Full incontinence of feces: Secondary | ICD-10-CM

## 2020-04-19 DIAGNOSIS — R278 Other lack of coordination: Secondary | ICD-10-CM

## 2020-04-19 DIAGNOSIS — N39498 Other specified urinary incontinence: Secondary | ICD-10-CM

## 2020-04-19 NOTE — Therapy (Signed)
[facility] [address], [address] [place], [place], [address] Phone: [phone]   Fax:  [phone]  Physical Therapy Treatment  Patient Details  Name: Alexis Shelton MRN: [id number] Date of Birth: Sep 24, 1941 Referring Provider (PT): Dr. Stan Head   Encounter Date: 04/19/2020  PT End of Session - 04/19/20 1149    Visit Number  2    Date for PT Re-Evaluation  05/24/20    Authorization Type  medicare    PT Start Time  1147    PT Stop Time  1225    PT Time Calculation (min)  38 min    Activity Tolerance  Patient tolerated treatment well    Behavior During Therapy  [facility] for tasks assessed/performed       Past Medical History:  Diagnosis Date  . Arthritis   . DDD (degenerative disc disease), lumbar 11/24/2018  . Dizziness   . Gallstones   . Hearing loss   . History of stroke involving cerebellum 10/13/2018  . Hypercholesteremia   . Hypertension   . Hypothyroidism   . 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  . Migraines   . Mild intermittent asthma without complication 02/11/2019  . Primary osteoarthritis of both feet 11/24/2018  . Rheumatoid arthritis (HCC) 07/04/2018  . Stage 3 chronic kidney disease 02/11/2019  . Vitamin D deficiency 02/11/2019    Past Surgical History:  Procedure Laterality Date  . ABDOMINAL HYSTERECTOMY    . BREAST REDUCTION SURGERY Bilateral   . CATARACT EXTRACTION, BILATERAL    . INCISION AND DRAINAGE BREAST ABSCESS Left   . REPLACEMENT TOTAL KNEE Bilateral   . TONSILLECTOMY AND ADENOIDECTOMY      There were no vitals filed for this visit.  Subjective Assessment - 04/19/20 1150    Subjective  I had food poisioning and was very sick. I am still recovering.    Patient Stated Goals  work on urinary and fecal control    Currently in Pain?  No/denies                       [facility] Adult PT Treatment/Exercise - 04/19/20 0001      Lumbar Exercises: Supine   Ab Set  20 reps     AB Set Limitations  with ball squeeze, pelvic floor contraction and TA contraction    Isometric Hip Flexion  3 seconds;15 reps    Isometric Hip Flexion Limitations  each leg , needs more help with left leg due ot right abdominals not contracting well      Manual Therapy   Manual Therapy  Soft tissue mobilization    Soft tissue mobilization  circular massage to improve peristalic motion of the intestines, scar massage on the lower abdomen              PT Education - 04/19/20 1228    Education Details  Access Code: [email or web address]    Person(s) Educated  Patient    Methods  Explanation;Demonstration;Verbal cues;Handout    Comprehension  Returned demonstration;Verbalized understanding       PT Short Term Goals - 04/19/20 1153      PT SHORT TERM GOAL #1   Title  independent with initial HEP    Time  4    Period  Weeks    Status  New    Target Date  04/26/20      PT SHORT TERM GOAL #2   Title  able to feel the stool leakage due  to increased  pelvic floor awareness    Time  4    Period  Weeks    Status  New      PT SHORT TERM GOAL #3   Title  understand how to use vaginal moisturizers to reduce dryness    Time  4    Period  Weeks    Status  New        PT Long Term Goals - 03/29/20 1619      PT LONG TERM GOAL #1   Title  independent with advanced HEP    Time  8    Period  Weeks    Status  New    Target Date  04/26/20      PT LONG TERM GOAL #2   Title  pelvic floor strength rectally and vaginally >/= 3/5 holding for 15 seconds    Time  8    Period  Weeks    Status  New    Target Date  05/24/20      PT LONG TERM GOAL #3   Title  fecal leakage decreased >/= 60% due to improve strength and tone of the anal sphincter muscles    Time  8    Period  Weeks    Status  New    Target Date  05/24/20      PT LONG TERM GOAL #4   Title  urinary leakage decreased >/= 60% due to improve tone of pelvic floor    Time  8    Period  Weeks    Status  New    Target Date   05/24/20            Plan - 04/19/20 1229    Clinical Impression Statement  Patient had food poisioning and is still recovering. Patient has difficulty with contracting the right abdominals during left hip isometrics. Patient will lift her buttocks with anal contraction. Patient will bulge her stomach with coughing and not manage her abdominal pressure to reduce leakage. Patient will benefit from skilled therapy to improve pelvic floor strength and coordination to reduce the leakage and improve her awareness.    Personal Factors and Comorbidities  Fitness;Age;Time since onset of injury/illness/exacerbation;Comorbidity 3+    Comorbidities  abdominal hysterectomy; RA; Stroke    Examination-Activity Limitations  Continence;Hygiene/Grooming;Toileting    Examination-Participation Restrictions  Community Activity    Stability/Clinical Decision Making  Evolving/Moderate complexity    Rehab Potential  Good    PT Frequency  2x / week    PT Duration  8 weeks    PT Treatment/Interventions  Biofeedback;Cryotherapy;Electrical Stimulation;Moist Heat;Neuromuscular re-education;Balance training;Therapeutic exercise;Therapeutic activities;Patient/family education;Manual techniques    PT Next Visit Plan  work on rib mobility, contraction of the right abdominals and TA, reduce pressure on pelvic floor with actvity, bowel health    PT Home Exercise Plan  Access Code: [email or web address]    Recommended Other Services  MD signed initial eva;    Consulted and Agree with Plan of Care  Patient       Patient will benefit from skilled therapeutic intervention in order to improve the following deficits and impairments:  Decreased coordination, Decreased range of motion, Increased fascial restricitons, Decreased strength, Decreased activity tolerance  Visit Diagnosis: Muscle weakness (generalized)  Other lack of coordination  Incontinence of feces, unspecified fecal incontinence type  Other urinary  incontinence     Problem List Patient Active Problem List   Diagnosis Date Noted  . Vitamin D deficiency 02/11/2019  . Stage  3 chronic kidney disease 02/11/2019  . Mild intermittent asthma without complication 41/93/7902  . Primary osteoarthritis of both hands 11/24/2018  . Primary osteoarthritis of both feet 11/24/2018  . DDD (degenerative disc disease), cervical 11/24/2018  . DDD (degenerative disc disease), lumbar 11/24/2018  . History of stroke involving cerebellum 10/13/2018  . Gait abnormality 10/13/2018  . 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  . Asthma 08/12/2018  . Hypothyroidism 08/12/2018  . Rheumatoid arthritis ([place]) 07/04/2018  . Essential hypertension 07/04/2018  . Hyperlipidemia 07/04/2018    Earlie Counts, PT 04/19/20 12:34 PM   [facility] [address] [place], [place], [address] Phone: [phone]   Fax:  [phone]  Name: KALIKA SMAY MRN: [id number] Date of Birth: July 09, 1941

## 2020-04-19 NOTE — Patient Instructions (Signed)
Access Code: [email or web address] URL: [email or web address] Date: 04/19/2020 Prepared by: Eulis Foster  Exercises Supine Transversus Abdominis Bracing - Hands on Stomach - 1 x daily - 7 x weekly - 1 sets - 10 reps - 3 sec hold Hooklying Isometric Hip Flexion - 1 x daily - 7 x weekly - 1 sets - 10 reps - 5 sec hold [facility] [address] [place], [place] [address] Phone # [phone] Fax [phone]

## 2020-04-25 ENCOUNTER — Telehealth: Payer: Self-pay | Admitting: Family Medicine

## 2020-04-25 NOTE — Telephone Encounter (Signed)
Patient called advised she has a lump on the side of of her neck. The number to contact is [phone]

## 2020-04-26 ENCOUNTER — Telehealth: Payer: Self-pay | Admitting: Family Medicine

## 2020-04-26 ENCOUNTER — Other Ambulatory Visit: Payer: Self-pay

## 2020-04-26 ENCOUNTER — Ambulatory Visit (INDEPENDENT_AMBULATORY_CARE_PROVIDER_SITE_OTHER): Payer: Medicare Other | Admitting: Family Medicine

## 2020-04-26 ENCOUNTER — Encounter: Payer: Self-pay | Admitting: Family Medicine

## 2020-04-26 VITALS — BP 136/66 | HR 73

## 2020-04-26 DIAGNOSIS — K1120 Sialoadenitis, unspecified: Secondary | ICD-10-CM

## 2020-04-26 MED ORDER — AMOXICILLIN-POT CLAVULANATE 875-125 MG PO TABS: 1.0000 | ORAL_TABLET | Freq: Two times a day (BID) | ORAL | 0 refills | Status: DC

## 2020-04-26 NOTE — Telephone Encounter (Signed)
Coming in this afternoon to be seen by Dr. Prince Rome.

## 2020-04-26 NOTE — Telephone Encounter (Signed)
Patient is coming in today at 3:40 to be evaluated by Dr. Prince Rome.

## 2020-04-26 NOTE — Progress Notes (Signed)
Office Visit Note   Patient: Alexis Shelton           Date of Birth: 12/12/1941           MRN: [id number] Visit Date: 04/26/2020 Requested by: Lavada Mesi, MD [address] [place],  [place] [address] PCP: Lavada Mesi, MD  Subjective: Chief Complaint  Patient presents with  . Painful lump left side of neck, below jawline    HPI: She is here with a painful lump on the left side of her jaw.  Symptoms started a couple days ago.  It got worse when eating something sour.  No fevers or chills.              ROS:   All other systems were reviewed and are negative.  Objective: Vital Signs: BP 136/66   Pulse 73   Physical Exam:  General:  Alert and oriented, in no acute distress. Pulm:  Breathing unlabored. Psy:  Normal mood, congruent affect. Skin: No erythema HEENT: She has tender salivary gland on the left.  Oropharynx is clear.  No significant lymphadenopathy.  Imaging: No results found.  Assessment & Plan: 1.  Left-sided sialoadenitis, cannot rule out stone. -She will eat sour candies.  We will treat with Augmentin.  Imaging if not improving.  Follow-up as needed.     Procedures: No procedures performed  No notes on file     PMFS History: Patient Active Problem List   Diagnosis Date Noted  . Vitamin D deficiency 02/11/2019  . Stage 3 chronic kidney disease 02/11/2019  . Mild intermittent asthma without complication 02/11/2019  . Primary osteoarthritis of both hands 11/24/2018  . Primary osteoarthritis of both feet 11/24/2018  . DDD (degenerative disc disease), cervical 11/24/2018  . DDD (degenerative disc disease), lumbar 11/24/2018  . History of stroke involving cerebellum 10/13/2018  . Gait abnormality 10/13/2018  . 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  . Asthma 08/12/2018  . Hypothyroidism 08/12/2018  . Rheumatoid arthritis (HCC) 07/04/2018  . Essential hypertension 07/04/2018  . Hyperlipidemia 07/04/2018   Past Medical History:  Diagnosis  Date  . Arthritis   . DDD (degenerative disc disease), lumbar 11/24/2018  . Dizziness   . Gallstones   . Hearing loss   . History of stroke involving cerebellum 10/13/2018  . Hypercholesteremia   . Hypertension   . Hypothyroidism   . 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  . Migraines   . Mild intermittent asthma without complication 02/11/2019  . Primary osteoarthritis of both feet 11/24/2018  . Rheumatoid arthritis (HCC) 07/04/2018  . Stage 3 chronic kidney disease 02/11/2019  . Vitamin D deficiency 02/11/2019    Family History  Problem Relation Age of Onset  . Stroke Mother   . Hyperlipidemia Mother   . Arthritis Mother   . Heart disease Father   . Arthritis Sister   . Bipolar disorder Son   . Healthy Son   . Gallbladder disease Maternal Grandmother        radiation  . GER disease Maternal Grandmother   . Heart disease Paternal Uncle        x 7  . Colon cancer Neg Hx   . Esophageal cancer Neg Hx   . Rectal cancer Neg Hx   . Stomach cancer Neg Hx     Past Surgical History:  Procedure Laterality Date  . ABDOMINAL HYSTERECTOMY    . BREAST REDUCTION SURGERY Bilateral   . CATARACT EXTRACTION, BILATERAL    . INCISION AND  DRAINAGE BREAST ABSCESS Left   . REPLACEMENT TOTAL KNEE Bilateral   . TONSILLECTOMY AND ADENOIDECTOMY     Social History   Occupational History  . Occupation: Retired - Animator    Comment: [employer]  Tobacco Use  . Smoking status: Former Smoker    Types: Cigarettes    Quit date: 1970    Years since quitting: 51.3  . Smokeless tobacco: Never Used  Substance and Sexual Activity  . Alcohol use: Not Currently  . Drug use: Never  . Sexual activity: Not on file

## 2020-04-26 NOTE — Telephone Encounter (Signed)
Patient called.   She says that she developed a lump in her left chin. She feels she needs to be seen sooner than the next available.   Call back: [phone]

## 2020-04-26 NOTE — Progress Notes (Signed)
First noticed the lump yesterday. Ate something sour and felt a sharp pain/spasm in that area of her neck.

## 2020-04-27 ENCOUNTER — Ambulatory Visit: Payer: Medicare Other | Admitting: Family Medicine

## 2020-04-29 ENCOUNTER — Ambulatory Visit: Payer: Medicare Other | Admitting: Family Medicine

## 2020-05-02 ENCOUNTER — Encounter: Payer: Medicare Other | Admitting: Physical Therapy

## 2020-05-04 ENCOUNTER — Ambulatory Visit: Payer: Medicare Other | Attending: Internal Medicine | Admitting: Physical Therapy

## 2020-05-04 ENCOUNTER — Other Ambulatory Visit: Payer: Self-pay

## 2020-05-04 ENCOUNTER — Encounter: Payer: Self-pay | Admitting: Physical Therapy

## 2020-05-04 DIAGNOSIS — R278 Other lack of coordination: Secondary | ICD-10-CM | POA: Diagnosis present

## 2020-05-04 DIAGNOSIS — R159 Full incontinence of feces: Secondary | ICD-10-CM | POA: Insufficient documentation

## 2020-05-04 DIAGNOSIS — M6281 Muscle weakness (generalized): Secondary | ICD-10-CM | POA: Insufficient documentation

## 2020-05-04 DIAGNOSIS — N39498 Other specified urinary incontinence: Secondary | ICD-10-CM

## 2020-05-04 NOTE — Patient Instructions (Addendum)
Moisturizers . They are used in the vagina to hydrate the mucous membrane that make up the vaginal canal. . Designed to keep a more normal acid balance (ph) . Once placed in the vagina, it will last between two to three days.  . Use 2-3 times per week at bedtime  . Ingredients to avoid is glycerin and fragrance, can increase chance of infection . Should not be used just before sex due to causing irritation . Most are gels administered either in a tampon-shaped applicator or as a vaginal suppository. They are non-hormonal.   Types of Moisturizers in the vaginal canal  . Vitamin E vaginal suppositories- Whole foods, Amazon . Moist Again . Coconut oil- can break down condoms . Julva- (Do no use if on Tamoxifen) amazon . Yes moisturizer- amazon . NeuEve Silk , NeuEve Silver for menopausal or over 65 (if have severe vaginal atrophy or cancer treatments use NeuEve Silk for  1 month than move to [employer])- [employer], ShapeConsultant.com.cy . Olive and Bee intimate cream- www.oliveandbee.com.au . Mae vaginal moisturizer- Amazon . Aloe .    Creams to use externally on the Vulva area  Marathon Oil (good for for cancer patients that had radiation to the area)- [place] or [employer].https://garcia-valdez.org/  V-magic cream - amazon  Julva-amazon  Vital "V Wild Yam salve ( help moisturize and help with thinning vulvar area, does have Beeswax  MoodMaid Botanical Pro-Meno Wild Yam Cream- Amazon  Desert Harvest Gele  Cleo by Zane Herald labial moisturizer (Amazon,   Coconut or olive oil  aloe   Things to avoid in the vaginal area . Do not use things to irritate the vulvar area . No lotions just specialized creams for the vulva area- Neogyn, V-magic, No soaps; can use Aveeno or Calendula cleanser if needed. Must be gentle . No deodorants . No douches . Good to sleep without underwear to let the vaginal area to air out . No scrubbing: spread the lips to let warm water rinse over labias and pat  dry  About Abdominal Massage  Abdominal massage, also called external colon massage, is a self-treatment circular massage technique that can reduce and eliminate gas and ease constipation. The colon naturally contracts in waves in a clockwise direction starting from inside the right hip, moving up toward the ribs, across the belly, and down inside the left hip.  When you perform circular abdominal massage, you help stimulate your colon's normal wave pattern of movement called peristalsis.  It is most beneficial when done after eating.  Positioning You can practice abdominal massage with oil while lying down, or in the shower with soap.  Some people find that it is just as effective to do the massage through clothing while sitting or standing.  How to Massage Start by placing your finger tips or knuckles on your right side, just inside your hip bone.  . Make small circular movements while you move upward toward your rib cage.   . Once you reach the bottom right side of your rib cage, take your circular movements across to the left side of the bottom of your rib cage.  . Next, move downward until you reach the inside of your left hip bone.  This is the path your feces travel in your colon. . Continue to perform your abdominal massage in this pattern for 10 minutes each day.     You can apply as much pressure as is comfortable in your massage.  Start gently and build pressure as you  continue to practice.  Notice any areas of pain as you massage; areas of slight pain may be relieved as you massage, but if you have areas of significant or intense pain, consult with your healthcare provider.  Other Considerations . General physical activity including bending and stretching can have a beneficial massage-like effect on the colon.  Deep breathing can also stimulate the colon because breathing deeply activates the same nervous system that supplies the colon.   . Abdominal massage should always be used in  combination with a bowel-conscious diet that is high in the proper type of fiber for you, fluids (primarily water), and a regular exercise program. [facility] [address] [place], [place] [address] Phone # [phone] Fax [phone]  Access Code: [email or web address] URL: [email or web address] Date: 04/19/2020 Prepared by: Earlie Counts  Exercises Supine Transversus Abdominis Bracing - Hands on Stomach - 1 x daily - 7 x weekly - 1 sets - 10 reps - 3 sec hold Hooklying Isometric Hip Flexion - 1 x daily - 7 x weekly - 1 sets - 10 reps - 5 sec hold Supine Pelvic Floor Contraction - 2 x daily - 7 x weekly - 1 sets - 10 reps - 5 sec hold

## 2020-05-04 NOTE — Therapy (Signed)
[facility] [address] [place], [place], [address] Phone: [phone]   Fax:  [phone]  Physical Therapy Treatment  Patient Details  Name: Alexis Shelton MRN: [id number] Date of Birth: June 11, 1941 Referring Provider (PT): Dr. Silvano Rusk   Encounter Date: 05/04/2020  PT End of Session - 05/04/20 1228    Visit Number  3    Date for PT Re-Evaluation  05/24/20    Authorization Type  medicare    PT Start Time  5400    PT Stop Time  1223    PT Time Calculation (min)  38 min    Activity Tolerance  Patient tolerated treatment well;No increased pain    Behavior During Therapy  WFL for tasks assessed/performed       Past Medical History:  Diagnosis Date  . Arthritis   . DDD (degenerative disc disease), lumbar 11/24/2018  . Dizziness   . Gallstones   . Hearing loss   . History of stroke involving cerebellum 10/13/2018  . Hypercholesteremia   . Hypertension   . Hypothyroidism   . 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  . Migraines   . Mild intermittent asthma without complication 8/67/6195  . Primary osteoarthritis of both feet 11/24/2018  . Rheumatoid arthritis ([place]) 07/04/2018  . Stage 3 chronic kidney disease 02/11/2019  . Vitamin D deficiency 02/11/2019    Past Surgical History:  Procedure Laterality Date  . ABDOMINAL HYSTERECTOMY    . BREAST REDUCTION SURGERY Bilateral   . CATARACT EXTRACTION, BILATERAL    . INCISION AND DRAINAGE BREAST ABSCESS Left   . REPLACEMENT TOTAL KNEE Bilateral   . TONSILLECTOMY AND ADENOIDECTOMY      There were no vitals filed for this visit.  Subjective Assessment - 05/04/20 1151    Subjective  The rectal is improving. the urine is leaking more. I have done the exercises.  History of Sacral colpopexy. Not having the sudden gush of a bowel movement. One day of more liquid stool.    Patient Stated Goals  work on urinary and fecal control    Currently in Pain?  No/denies                         [facility] Adult PT Treatment/Exercise - 05/04/20 0001      Self-Care   Self-Care  Other Self-Care Comments    Other Self-Care Comments   education on vaginal moisturizers to reduce dryness and itchiness      Lumbar Exercises: Supine   Ab Set  20 reps    AB Set Limitations  with ball squeeze, pelvic floor contraction and TA contraction    Other Supine Lumbar Exercises  ball squeeze with anal contraction holding for 5 seconds      Manual Therapy   Manual Therapy  Soft tissue mobilization;Myofascial release    Manual therapy comments  educated patient on how to perform circular massage to do at home    Soft tissue mobilization  circular massage to improve peristalic motion of the intestines, scar massage on the lower abdomen     Myofascial Release  release of the right and left upper quadrant of the abdomen             PT Education - 05/04/20 1225    Education Details  Access Code: [email or web address]; intruction on vaginal moisturizers    Person(s) Educated  Patient    Methods  Explanation;Demonstration;Verbal cues;Handout    Comprehension  Returned  demonstration;Verbalized understanding       PT Short Term Goals - 05/04/20 1159      PT SHORT TERM GOAL #1   Title  independent with initial HEP    Time  4    Period  Weeks    Status  Achieved      PT SHORT TERM GOAL #2   Title  able to feel the stool leakage due to increased  pelvic floor awareness    Baseline  leakage and not always aware of leakage, if more leakage not aware of it    Time  4    Period  Weeks    Status  On-going      PT SHORT TERM GOAL #3   Title  understand how to use vaginal moisturizers to reduce dryness    Time  4    Period  Weeks    Status  Achieved        PT Long Term Goals - 03/29/20 1619      PT LONG TERM GOAL #1   Title  independent with advanced HEP    Time  8    Period  Weeks    Status  New    Target Date  04/26/20      PT LONG TERM GOAL #2   Title   pelvic floor strength rectally and vaginally >/= 3/5 holding for 15 seconds    Time  8    Period  Weeks    Status  New    Target Date  05/24/20      PT LONG TERM GOAL #3   Title  fecal leakage decreased >/= 60% due to improve strength and tone of the anal sphincter muscles    Time  8    Period  Weeks    Status  New    Target Date  05/24/20      PT LONG TERM GOAL #4   Title  urinary leakage decreased >/= 60% due to improve tone of pelvic floor    Time  8    Period  Weeks    Status  New    Target Date  05/24/20            Plan - 05/04/20 1228    Clinical Impression Statement  Patient was educated on vaginal moisturizers to relieve the itchiness and irritation on the vaginal area. Patient understands how to massage the abdomen to improve peristalic motion of the intestines. Patient reports she is doing better in the rectal area. Patient reports her urinary leakage is worse and she may need a referral to a urogynelcologist. Patient will benfeit from skilled therapy to imporve pelvic floor strength and coordination to reduce the leakage and improve her awareness.    Personal Factors and Comorbidities  Fitness;Age;Time since onset of injury/illness/exacerbation;Comorbidity 3+    Comorbidities  abdominal hysterectomy; RA; Stroke    Examination-Activity Limitations  Continence;Hygiene/Grooming;Toileting    Stability/Clinical Decision Making  Evolving/Moderate complexity    Rehab Potential  Good    PT Frequency  2x / week    PT Duration  8 weeks    PT Treatment/Interventions  Biofeedback;Cryotherapy;Electrical Stimulation;Moist Heat;Neuromuscular re-education;Balance training;Therapeutic exercise;Therapeutic activities;Patient/family education;Manual techniques    PT Next Visit Plan  toileting; contraction of the TA; bowel health; ask about the pressure in the pelvic floor; bowel health    PT Home Exercise Plan  Access Code: [email or web address]    Consulted and Agree with Plan of Care  Patient  Patient will benefit from skilled therapeutic intervention in order to improve the following deficits and impairments:  Decreased coordination, Decreased range of motion, Increased fascial restricitons, Decreased strength, Decreased activity tolerance  Visit Diagnosis: Muscle weakness (generalized)  Other lack of coordination  Incontinence of feces, unspecified fecal incontinence type  Other urinary incontinence     Problem List Patient Active Problem List   Diagnosis Date Noted  . Vitamin D deficiency 02/11/2019  . Stage 3 chronic kidney disease 02/11/2019  . Mild intermittent asthma without complication 02/11/2019  . Primary osteoarthritis of both hands 11/24/2018  . Primary osteoarthritis of both feet 11/24/2018  . DDD (degenerative disc disease), cervical 11/24/2018  . DDD (degenerative disc disease), lumbar 11/24/2018  . History of stroke involving cerebellum 10/13/2018  . Gait abnormality 10/13/2018  . 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  . Asthma 08/12/2018  . Hypothyroidism 08/12/2018  . Rheumatoid arthritis (HCC) 07/04/2018  . Essential hypertension 07/04/2018  . Hyperlipidemia 07/04/2018    Eulis Foster, PT 05/04/20 12:33 PM   [facility] [address] [place], [place], [address] Phone: [phone]   Fax:  [phone]  Name: Alexis Shelton MRN: [id number] Date of Birth: 12-Aug-1941

## 2020-05-09 ENCOUNTER — Encounter: Payer: Self-pay | Admitting: Physical Therapy

## 2020-05-09 ENCOUNTER — Ambulatory Visit: Payer: Medicare Other | Admitting: Physical Therapy

## 2020-05-09 ENCOUNTER — Other Ambulatory Visit: Payer: Self-pay

## 2020-05-09 DIAGNOSIS — M6281 Muscle weakness (generalized): Secondary | ICD-10-CM | POA: Diagnosis not present

## 2020-05-09 DIAGNOSIS — N39498 Other specified urinary incontinence: Secondary | ICD-10-CM

## 2020-05-09 DIAGNOSIS — R159 Full incontinence of feces: Secondary | ICD-10-CM

## 2020-05-09 DIAGNOSIS — R278 Other lack of coordination: Secondary | ICD-10-CM

## 2020-05-09 NOTE — Therapy (Signed)
[facility] [address] [place], [place], [address] Phone: [phone]   Fax:  [phone]  Physical Therapy Treatment  Patient Details  Name: Alexis Shelton MRN: [id number] Date of Birth: 13-Jul-1941 Referring Provider (PT): Dr. Silvano Rusk   Encounter Date: 05/09/2020  PT End of Session - 05/09/20 1225    Visit Number  4    Date for PT Re-Evaluation  05/24/20    Authorization Type  medicare    PT Start Time  1607    PT Stop Time  1225    PT Time Calculation (min)  40 min    Activity Tolerance  Patient tolerated treatment well;No increased pain    Behavior During Therapy  WFL for tasks assessed/performed       Past Medical History:  Diagnosis Date  . Arthritis   . DDD (degenerative disc disease), lumbar 11/24/2018  . Dizziness   . Gallstones   . Hearing loss   . History of stroke involving cerebellum 10/13/2018  . Hypercholesteremia   . Hypertension   . Hypothyroidism   . 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  . Migraines   . Mild intermittent asthma without complication 3/71/0626  . Primary osteoarthritis of both feet 11/24/2018  . Rheumatoid arthritis ([place]) 07/04/2018  . Stage 3 chronic kidney disease 02/11/2019  . Vitamin D deficiency 02/11/2019    Past Surgical History:  Procedure Laterality Date  . ABDOMINAL HYSTERECTOMY    . BREAST REDUCTION SURGERY Bilateral   . CATARACT EXTRACTION, BILATERAL    . INCISION AND DRAINAGE BREAST ABSCESS Left   . REPLACEMENT TOTAL KNEE Bilateral   . TONSILLECTOMY AND ADENOIDECTOMY      There were no vitals filed for this visit.  Subjective Assessment - 05/09/20 1150    Subjective  I had a bad spell yesterday with the anus. I tried the stuff you gave me.    Patient Stated Goals  work on urinary and fecal control    Currently in Pain?  No/denies                        [facility] Adult PT Treatment/Exercise - 05/09/20 0001      Self-Care   Self-Care  Other Self-Care Comments    Other Self-Care Comments   education on vaginal moisturizers and bowel health      Lumbar Exercises: Aerobic   Nustep  level 1 for 5 min. seat #8      Lumbar Exercises: Seated   Other Seated Lumbar Exercises  seated pelvic floor contraction, ball squeeze, push down on foam roll    Other Seated Lumbar Exercises  pelvic floor contraction with alternate shoulder flexion and towel squeeze 20x; alternate hip flexion with pelvic floor contraction             PT Education - 05/09/20 1225    Education Details  Access Code: [email or web address]; information on vaginal moisturizers and bowel health    Person(s) Educated  Patient    Methods  Explanation;Demonstration;Verbal cues;Handout    Comprehension  Verbalized understanding;Returned demonstration       PT Short Term Goals - 05/04/20 1159      PT SHORT TERM GOAL #1   Title  independent with initial HEP    Time  4    Period  Weeks    Status  Achieved      PT SHORT TERM GOAL #2   Title  able to feel the stool  leakage due to increased  pelvic floor awareness    Baseline  leakage and not always aware of leakage, if more leakage not aware of it    Time  4    Period  Weeks    Status  On-going      PT SHORT TERM GOAL #3   Title  understand how to use vaginal moisturizers to reduce dryness    Time  4    Period  Weeks    Status  Achieved        PT Long Term Goals - 05/09/20 1230      PT LONG TERM GOAL #1   Title  independent with advanced HEP    Time  8    Period  Weeks    Status  On-going      PT LONG TERM GOAL #2   Title  pelvic floor strength rectally and vaginally >/= 3/5 holding for 15 seconds    Time  8    Period  Weeks    Status  On-going      PT LONG TERM GOAL #3   Title  fecal leakage decreased >/= 60% due to improve strength and tone of the anal sphincter muscles    Time  8    Period  Weeks    Status  On-going      PT LONG TERM GOAL #4   Title  urinary leakage decreased >/=  60% due to improve tone of pelvic floor    Time  8    Period  Weeks    Status  On-going            Plan - 05/09/20 1212    Clinical Impression Statement  Patiet is learning more exercises to work the pelvic floor and core. Patient reports no pressure feeling in the pelvic floor. Patient is trying different vaginal moisturizers to improve hydration to the tissue. Patient had fecal accidents yesterday. Patient is working on her endurance. Patient will benefit from skilled therapy to improve pelvic floor strength and coordination to reduce the leakage and improve her weakness.    Personal Factors and Comorbidities  Fitness;Age;Time since onset of injury/illness/exacerbation;Comorbidity 3+    Comorbidities  abdominal hysterectomy; RA; Stroke    Examination-Activity Limitations  Continence;Hygiene/Grooming;Toileting    Examination-Participation Restrictions  Community Activity    Stability/Clinical Decision Making  Evolving/Moderate complexity    Rehab Potential  Good    PT Frequency  2x / week    PT Duration  8 weeks    PT Treatment/Interventions  Biofeedback;Cryotherapy;Electrical Stimulation;Moist Heat;Neuromuscular re-education;Balance training;Therapeutic exercise;Therapeutic activities;Patient/family education;Manual techniques    PT Next Visit Plan  toileting; working on abdominal and pelvic floor contraction in sitting and standing; hip strength with pelvic floor contraction    PT Home Exercise Plan  Access Code: [email or web address]    Consulted and Agree with Plan of Care  Patient       Patient will benefit from skilled therapeutic intervention in order to improve the following deficits and impairments:  Decreased coordination, Decreased range of motion, Increased fascial restricitons, Decreased strength, Decreased activity tolerance  Visit Diagnosis: Muscle weakness (generalized)  Other lack of coordination  Incontinence of feces, unspecified fecal incontinence type  Other urinary  incontinence     Problem List Patient Active Problem List   Diagnosis Date Noted  . Vitamin D deficiency 02/11/2019  . Stage 3 chronic kidney disease 02/11/2019  . Mild intermittent asthma without complication 02/11/2019  . Primary osteoarthritis of both hands  11/24/2018  . Primary osteoarthritis of both feet 11/24/2018  . DDD (degenerative disc disease), cervical 11/24/2018  . DDD (degenerative disc disease), lumbar 11/24/2018  . History of stroke involving cerebellum 10/13/2018  . Gait abnormality 10/13/2018  . 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  . Asthma 08/12/2018  . Hypothyroidism 08/12/2018  . Rheumatoid arthritis (HCC) 07/04/2018  . Essential hypertension 07/04/2018  . Hyperlipidemia 07/04/2018    Eulis Foster, PT 05/09/20 12:31 PM   [facility] [address] [place], [place], [address] Phone: [phone]   Fax:  [phone]  Name: SUNNI RICHARDSON MRN: [id number] Date of Birth: 26-Oct-1941

## 2020-05-09 NOTE — Patient Instructions (Addendum)
Moisturizers . They are used in the vagina to hydrate the mucous membrane that make up the vaginal canal. . Designed to keep a more normal acid balance (ph) . Once placed in the vagina, it will last between two to three days.  . Use 2-3 times per week at bedtime  . Ingredients to avoid is glycerin and fragrance, can increase chance of infection . Should not be used just before sex due to causing irritation . Most are gels administered either in a tampon-shaped applicator or as a vaginal suppository. They are non-hormonal.   Types of Moisturizers-internal of the vagina  . Vitamin E vaginal suppositories- Whole foods, Amazon . Moist Again . Coconut oil- can break down condoms . Julva- (Do no use if on Tamoxifen) amazon . Yes moisturizer- amazon . NeuEve Silk , NeuEve Silver for menopausal or over 65 (if have severe vaginal atrophy or cancer treatments use NeuEve Silk for  1 month than move to [employer])- [employer], ShapeConsultant.com.cy . Olive and Bee intimate cream- www.oliveandbee.com.au . Mae vaginal moisturizer- Amazon . Aloe .    Creams to use externally on the Vulva area  Marathon Oil (good for for cancer patients that had radiation to the area)- [place] or [employer].https://garcia-valdez.org/  V-magic cream - amazon  Julva-amazon  Vital "V Wild Yam salve ( help moisturize and help with thinning vulvar area, does have Beeswax  MoodMaid Botanical Pro-Meno Wild Yam Cream- Amazon  Desert Harvest Gele  Cleo by Zane Herald labial moisturizer (Amazon,   Coconut or olive oil  Aloe  Estradiol cream for the vaginal area   Introduction to Bowel Health Diet and daily habits can help you predict when your bowels will move on a regular basis.  The consistency and quantity of the stool is usually more important than the frequency.  The goal is to have a regular bowel movement that is soft but formed.   Tips on Emptying Regularly . Eat breakfast.  Usually the best time of day for a bowel movement  will be a half hour to an hour after eating.  These times are best because the body uses the gastrocolic reflex, a stimulation of bowel motion that occurs with eating, to help produce a bowel movement.  For some people even a simple hot drink in the morning can help the reflex action begin. . Eat all your meals at a predictable time each day.  The bowel functions best when food is introduced at the same regular intervals. . The amount of food eaten at a given time of day should be about the same size from day to day.  The bowel functions best when food is introduced in similar quantities from day to day. It is fine to have a small breakfast and a large lunch, or vice versa, just be consistent. . Eat two servings of fruit or vegetables and at least one serving of a complex carbohydrates (whole grains such as brown rice, bran, whole wheat bread, or oatmeal) at each meal. . Drink plenty of water--ideally eight glasses a day.  Be sure to increase your water intake if you are increasing fiber into your diet.  Maintain Healthy Habits . Exercise daily.  You may exercise at any time of day, but you may find that bowel function is helped most if the exercise is at a consistent time each day. . Make sure that you are not rushed and have convenient access to a bathroom at your selected time to empty your bowels.  1. Sitting with squeeze rolled up towel, contract the anus and push down on the table for 5 seconds 10x  Access Code: [email or web address] URL: [email or web address] Date: 04/19/2020 Prepared by: Earlie Counts  Exercises Supine Transversus Abdominis Bracing - Hands on Stomach - 1 x daily - 7 x weekly - 1 sets - 10 reps - 3 sec hold Hooklying Isometric Hip Flexion - 1 x daily - 7 x weekly - 1 sets - 10 reps - 5 sec hold Supine Pelvic Floor Contraction - 2 x daily - 7 x weekly - 1 sets - 10 reps - 5 sec hold Seated Shoulder Flexion Full Range Single Arm - 1 x daily - 7 x weekly - 2 sets - 10  reps Seated Hip Flexion March with Ankle Weights - 1 x daily - 7 x weekly - 1 sets - 10 reps [facility] [address] [place], [place] [address] Phone # [phone] Fax [phone]

## 2020-05-10 ENCOUNTER — Other Ambulatory Visit: Payer: Self-pay

## 2020-05-10 ENCOUNTER — Ambulatory Visit (INDEPENDENT_AMBULATORY_CARE_PROVIDER_SITE_OTHER): Payer: Medicare Other | Admitting: Family Medicine

## 2020-05-10 DIAGNOSIS — R109 Unspecified abdominal pain: Secondary | ICD-10-CM

## 2020-05-11 ENCOUNTER — Ambulatory Visit: Payer: Medicare Other | Admitting: Physical Therapy

## 2020-05-11 ENCOUNTER — Encounter: Payer: Self-pay | Admitting: Physical Therapy

## 2020-05-11 DIAGNOSIS — M6281 Muscle weakness (generalized): Secondary | ICD-10-CM | POA: Diagnosis not present

## 2020-05-11 DIAGNOSIS — N39498 Other specified urinary incontinence: Secondary | ICD-10-CM

## 2020-05-11 DIAGNOSIS — R278 Other lack of coordination: Secondary | ICD-10-CM

## 2020-05-11 DIAGNOSIS — R159 Full incontinence of feces: Secondary | ICD-10-CM

## 2020-05-11 NOTE — Therapy (Signed)
[facility] [address], [place], [place], [address] Phone: [phone]   Fax:  [phone]  Physical Therapy Treatment  Patient Details  Name: Alexis Shelton MRN: [id number] Date of Birth: 1941/10/09 Referring Provider (PT): Dr. Silvano Rusk   Encounter Date: 05/11/2020  PT End of Session - 05/11/20 1226    Visit Number  5    Date for PT Re-Evaluation  05/24/20    Authorization Type  medicare    PT Start Time  5462    PT Stop Time  1223    PT Time Calculation (min)  38 min    Activity Tolerance  Patient tolerated treatment well;No increased pain    Behavior During Therapy  WFL for tasks assessed/performed       Past Medical History:  Diagnosis Date  . Arthritis   . DDD (degenerative disc disease), lumbar 11/24/2018  . Dizziness   . Gallstones   . Hearing loss   . History of stroke involving cerebellum 10/13/2018  . Hypercholesteremia   . Hypertension   . Hypothyroidism   . 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  . Migraines   . Mild intermittent asthma without complication 06/25/5008  . Primary osteoarthritis of both feet 11/24/2018  . Rheumatoid arthritis ([place]) 07/04/2018  . Stage 3 chronic kidney disease 02/11/2019  . Vitamin D deficiency 02/11/2019    Past Surgical History:  Procedure Laterality Date  . ABDOMINAL HYSTERECTOMY    . BREAST REDUCTION SURGERY Bilateral   . CATARACT EXTRACTION, BILATERAL    . INCISION AND DRAINAGE BREAST ABSCESS Left   . REPLACEMENT TOTAL KNEE Bilateral   . TONSILLECTOMY AND ADENOIDECTOMY      There were no vitals filed for this visit.  Subjective Assessment - 05/11/20 1149    Subjective  I feel like I am getting better. I am getting stronger. I have more control with the continence. I have less abdominal pain. I stop at 7:00 PM to eat.    Patient Stated Goals  work on urinary and fecal control    Currently in Pain?  No/denies         [facility] PT Assessment -  05/11/20 0001      Assessment   Medical Diagnosis  R32, R15.9 Urinary and fecal incontinence    Referring Provider (PT)  Dr. Silvano Rusk    Onset Date/Surgical Date  --   chronic   Prior Therapy  none      Precautions   Precautions  Other (comment)    Precaution Comments  Rhuematoid Arthritis, vertigo      Restrictions   Weight Bearing Restrictions  No      Home Environment   Living Environment  Private residence      Prior Function   Level of [place]  Retired      Associate Professor   Overall Cognitive Status  Within Functional Limits for tasks assessed                 Pelvic Floor Special Questions - 05/11/20 0001    Number of Vaginal Deliveries  1    Any difficulty with labor and deliveries  Yes    Urinary Leakage  Yes    Pad use  2-3 pads during the night; 1 overnight pad    Activities that cause leaking  Sneezing;Coughing    Fecal incontinence  Yes   does not feel it   Pelvic Floor Internal Exam  Patient confirms identification and approves PT to assess pelvic floor and treatment    Exam Type  Vaginal;Rectal    Palpation  decreased anterior contraction of the rectum, vaginal contraction is circular ; tenderness located in the posterior skin    Strength  weak squeeze, no lift   rectal and vaginal       [facility] Adult PT Treatment/Exercise - 05/11/20 0001      Self-Care   Self-Care  Other Self-Care Comments    Other Self-Care Comments   instructed to the atient on the urge to void when she gets to her home, discussed on how to regain control of the bladder      Therapeutic Activites    Therapeutic Activities  Other Therapeutic Activities    Other Therapeutic Activities  education on correct toileting technique and how to breath to relax the pelvic floor and generate pressure to push stool out      Lumbar Exercises: Aerobic   Nustep  level 1 for 5 min. seat #8      Lumbar Exercises: Seated   Long Arc Quad on Chair   Strengthening;Right;Left;1 set;15 reps    LAQ on Chair Limitations  with pelvic floor contraction and ball squeeze    Other Seated Lumbar Exercises  seated pelvic floor contraction, ball squeeze, push down on foam roll    Other Seated Lumbar Exercises  pelvic floor contraction with alternate shoulder flexion and towel squeeze 20x; alternate hip flexion with pelvic floor contraction      Lumbar Exercises: Supine   Bridge with [employer]  10 reps    Bridge with [employer] Limitations  with pelvic floor contraction    Isometric Hip Flexion  10 reps;5 seconds    Isometric Hip Flexion Limitations  counting out loud and contract the pelvic floor      Knee/Hip Exercises: Standing   Heel Raises  Both;1 set;20 reps    Heel Raises Limitations  with pelvic floor contraction             PT Education - 05/11/20 1224    Education Details  toileting technique    Person(s) Educated  Patient    Methods  Explanation;Demonstration;Handout    Comprehension  Returned demonstration;Verbalized understanding       PT Short Term Goals - 05/04/20 1159      PT SHORT TERM GOAL #1   Title  independent with initial HEP    Time  4    Period  Weeks    Status  Achieved      PT SHORT TERM GOAL #2   Title  able to feel the stool leakage due to increased  pelvic floor awareness    Baseline  leakage and not always aware of leakage, if more leakage not aware of it    Time  4    Period  Weeks    Status  On-going      PT SHORT TERM GOAL #3   Title  understand how to use vaginal moisturizers to reduce dryness    Time  4    Period  Weeks    Status  Achieved        PT Long Term Goals - 05/09/20 1230      PT LONG TERM GOAL #1   Title  independent with advanced HEP    Time  8    Period  Weeks    Status  On-going      PT LONG TERM GOAL #2  Title  pelvic floor strength rectally and vaginally >/= 3/5 holding for 15 seconds    Time  8    Period  Weeks    Status  On-going      PT LONG TERM  GOAL #3   Title  fecal leakage decreased >/= 60% due to improve strength and tone of the anal sphincter muscles    Time  8    Period  Weeks    Status  On-going      PT LONG TERM GOAL #4   Title  urinary leakage decreased >/= 60% due to improve tone of pelvic floor    Time  8    Period  Weeks    Status  On-going            Plan - 05/11/20 1145    Clinical Impression Statement  Patient is feeling stronger. Patient is learning how to breath with her exercises. Patient needed to take freqent brakes due to difficulty breathing. Patient has a strong urge to urinate when she gets home from the store and learned the urge to void drll. Patient will benefit from skilled therapy to improve pelvic floor strength and coordination to reduce the leakage and improve wealkness.    Personal Factors and Comorbidities  Fitness;Age;Time since onset of injury/illness/exacerbation;Comorbidity 3+    Comorbidities  abdominal hysterectomy; RA; Stroke    Examination-Activity Limitations  Continence;Hygiene/Grooming;Toileting    Examination-Participation Restrictions  Community Activity    Stability/Clinical Decision Making  Evolving/Moderate complexity    Rehab Potential  Good    PT Frequency  2x / week    PT Duration  8 weeks    PT Treatment/Interventions  Biofeedback;Cryotherapy;Electrical Stimulation;Moist Heat;Neuromuscular re-education;Balance training;Therapeutic exercise;Therapeutic activities;Patient/family education;Manual techniques    PT Next Visit Plan  working on abdominal and pelvic floor contraction in sitting and standing; hip strength with pelvic floor contraction; check pelvic floor strength    PT Home Exercise Plan  Access Code: [email or web address]    Consulted and Agree with Plan of Care  Patient       Patient will benefit from skilled therapeutic intervention in order to improve the following deficits and impairments:  Decreased coordination, Decreased range of motion, Increased fascial  restricitons, Decreased strength, Decreased activity tolerance  Visit Diagnosis: Muscle weakness (generalized)  Other lack of coordination  Incontinence of feces, unspecified fecal incontinence type  Other urinary incontinence     Problem List Patient Active Problem List   Diagnosis Date Noted  . Vitamin D deficiency 02/11/2019  . Stage 3 chronic kidney disease 02/11/2019  . Mild intermittent asthma without complication 02/11/2019  . Primary osteoarthritis of both hands 11/24/2018  . Primary osteoarthritis of both feet 11/24/2018  . DDD (degenerative disc disease), cervical 11/24/2018  . DDD (degenerative disc disease), lumbar 11/24/2018  . History of stroke involving cerebellum 10/13/2018  . Gait abnormality 10/13/2018  . 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  . Asthma 08/12/2018  . Hypothyroidism 08/12/2018  . Rheumatoid arthritis (HCC) 07/04/2018  . Essential hypertension 07/04/2018  . Hyperlipidemia 07/04/2018    Eulis Foster, PT 05/11/20 12:31 PM   [facility] [address] [place], [place], [address] Phone: [phone]   Fax:  [phone]  Name: Alexis Shelton MRN: [id number] Date of Birth: 02-11-1941

## 2020-05-11 NOTE — Patient Instructions (Signed)
Toileting Techniques for Bowel Movements    An Evacuation/Defecation Plan   Here are the 4 basic points:  1. Lean forward enough for your elbows to rest on your knees 2. Support your feet on the floor or use a low stool if your feet don't touch the floor  3. Push out your belly as if you have swallowed a beach ball--you should feel a widening of your waist. "Belly Big, Belly Hard" 4. Open and relax your pelvic floor muscles, rather than tightening around the anus  While you are sitting on the toilet pay attention to the following areas: . Jaw and mouth position- relaxed not clenched . Angle of your hips - leaning slightly forward . Whether your feet touch the ground or not - should be flat and supported . Arm placement - rest against your thighs . Spine position - flat back . Waist . Breathing - exhale as you push (like blowing up a balloon or try using other sounds such as ahhhh, shhhhh, ohhhh or grrrrrrr) . Belly - hard and tight as you push . Anus (opening of the anal canal) - relaxed and open as you push . Anus - Tighten and lift pulling the muscle back in after you are done or if taking a break  If you are not successful after 10-15 minutes, try again later.  Avoid negative self-talk about your toileting experience.   Read this for more details and ask your PT if you need suggestions for adjustments or limitations:  1) Sitting on the toilet  a) Make sure your feet are supported - flat on the floor or step stool b) Many people find it effective to lean forward or raise their knees.  Propping your feet on a step stool (squatty potty is a brand name) can help the muscles around the anus to relax  c) When you lean forward, place your forearms on your thighs for support  2) Relaxing a) Breathe deeply and slowly in through your nose and out through your mouth. b) To become aware of how to relax your muscles, contracting and releasing muscles can be helpful.  Pull your pelvic floor  muscles in tightly by using the image of holding back gas, or closing around the anus (visualize making a circle smaller) and lifting the anus up and in.  Then release the muscles and your anus should drop down and feel open. Repeat 5 times ending with the feeling of relaxation. c) Keep your pelvic floor muscles relaxed; let your belly bulge out. d) The digestive tract starts at the mouth and ends at the anal opening, so be sure to relax both ends of the tube.  Place your tongue on the roof of your mouth with your teeth separated.  This helps relax your mouth and will help to relax the anus at the same time.  3) Emptying (defecation) a) Keep your pelvic floor and sphincter relaxed, then bulge your anal muscles.  Make the anal opening wide.  b) Stick your belly out as if you have swallowed a beach ball. c) Make your belly wall hard using your belly muscles while continuing to breathe. Doing this makes it easier to open your anus. d) Breath out and give a grunt (or try using other sounds such as ahhhh, shhhhh, ohhhh or grrrrrrr). e)  Can also try to act as if you are blowing up a balloon as you push  4) Finishing a) As you finish your bowel movement, pull the pelvic floor muscles   up and in.  This will leave your anus in the proper place rather than remaining pushed out and down. If you leave your anus pushed out and down, it will start to feel as though that is normal and give you incorrect signals about needing to have a bowel movement. [facility] [address], [address] [place], [place] [address] Phone # [phone] Fax [phone]  

## 2020-05-11 NOTE — Progress Notes (Signed)
Cancelled appointment

## 2020-05-16 ENCOUNTER — Encounter: Payer: Self-pay | Admitting: Physical Therapy

## 2020-05-16 ENCOUNTER — Other Ambulatory Visit: Payer: Self-pay

## 2020-05-16 ENCOUNTER — Ambulatory Visit: Payer: Medicare Other | Admitting: Physical Therapy

## 2020-05-16 DIAGNOSIS — M6281 Muscle weakness (generalized): Secondary | ICD-10-CM | POA: Diagnosis not present

## 2020-05-16 DIAGNOSIS — R278 Other lack of coordination: Secondary | ICD-10-CM

## 2020-05-16 DIAGNOSIS — R159 Full incontinence of feces: Secondary | ICD-10-CM

## 2020-05-16 DIAGNOSIS — N39498 Other specified urinary incontinence: Secondary | ICD-10-CM

## 2020-05-16 NOTE — Patient Instructions (Addendum)
Toileting Techniques for Bowel Movements    An Evacuation/Defecation Plan   Here are the 4 basic points:  1. Lean forward enough for your elbows to rest on your knees 2. Support your feet on the floor or use a low stool if your feet don't touch the floor  3. Push out your belly as if you have swallowed a beach ball--you should feel a widening of your waist. "Belly Big, Belly Hard" 4. Open and relax your pelvic floor muscles, rather than tightening around the anus  While you are sitting on the toilet pay attention to the following areas: . Jaw and mouth position- relaxed not clenched . Angle of your hips - leaning slightly forward . Whether your feet touch the ground or not - should be flat and supported . Arm placement - rest against your thighs . Spine position - flat back . Waist . Breathing - exhale as you push (like blowing up a balloon or try using other sounds such as ahhhh, shhhhh, ohhhh or grrrrrrr) . Belly - hard and tight as you push . Anus (opening of the anal canal) - relaxed and open as you push . Anus - Tighten and lift pulling the muscle back in after you are done or if taking a break  If you are not successful after 10-15 minutes, try again later.  Avoid negative self-talk about your toileting experience.   Read this for more details and ask your PT if you need suggestions for adjustments or limitations:  1) Sitting on the toilet  a) Make sure your feet are supported - flat on the floor or step stool b) Many people find it effective to lean forward or raise their knees.  Propping your feet on a step stool (squatty potty is a brand name) can help the muscles around the anus to relax  c) When you lean forward, place your forearms on your thighs for support  2) Relaxing a) Breathe deeply and slowly in through your nose and out through your mouth. b) To become aware of how to relax your muscles, contracting and releasing muscles can be helpful.  Pull your pelvic floor  muscles in tightly by using the image of holding back gas, or closing around the anus (visualize making a circle smaller) and lifting the anus up and in.  Then release the muscles and your anus should drop down and feel open. Repeat 5 times ending with the feeling of relaxation. c) Keep your pelvic floor muscles relaxed; let your belly bulge out. d) The digestive tract starts at the mouth and ends at the anal opening, so be sure to relax both ends of the tube.  Place your tongue on the roof of your mouth with your teeth separated.  This helps relax your mouth and will help to relax the anus at the same time.  3) Emptying (defecation) a) Keep your pelvic floor and sphincter relaxed, then bulge your anal muscles.  Make the anal opening wide.  b) Stick your belly out as if you have swallowed a beach ball. c) Make your belly wall hard using your belly muscles while continuing to breathe. Doing this makes it easier to open your anus. d) Breath out and give a grunt (or try using other sounds such as ahhhh, shhhhh, ohhhh or grrrrrrr). e)  Can also try to act as if you are blowing up a balloon as you push  4) Finishing a) As you finish your bowel movement, pull the pelvic floor muscles   up and in.  This will leave your anus in the proper place rather than remaining pushed out and down. If you leave your anus pushed out and down, it will start to feel as though that is normal and give you incorrect signals about needing to have a bowel movement. [facility] [address], [address] [place], [place] [address] Phone # [phone] Fax [phone]  [facility] [address], [place] [address] Directions  [phone]  [facility] ADDRESS: [address] [place], [place] [address] PHONE: [phone]

## 2020-05-16 NOTE — Therapy (Signed)
[facility] [address] [place], [place], [address] Phone: [phone]   Fax:  [phone]  Physical Therapy Treatment  Patient Details  Name: Alexis Shelton MRN: [id number] Date of Birth: [birth year] Referring Provider (PT): Dr. Stan Head   Encounter Date: 05/16/2020  PT End of Session - 05/16/20 1158    Visit Number  6    Date for PT Re-Evaluation  05/24/20    Authorization Type  medicare    PT Start Time  1145    PT Stop Time  1225    PT Time Calculation (min)  40 min    Activity Tolerance  Patient tolerated treatment well;No increased pain    Behavior During Therapy  WFL for tasks assessed/performed       Past Medical History:  Diagnosis Date  . Arthritis   . DDD (degenerative disc disease), lumbar 11/24/2018  . Dizziness   . Gallstones   . Hearing loss   . History of stroke involving cerebellum 10/13/2018  . Hypercholesteremia   . Hypertension   . Hypothyroidism   . 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  . Migraines   . Mild intermittent asthma without complication 02/11/2019  . Primary osteoarthritis of both feet 11/24/2018  . Rheumatoid arthritis (HCC) 07/04/2018  . Stage 3 chronic kidney disease 02/11/2019  . Vitamin D deficiency 02/11/2019    Past Surgical History:  Procedure Laterality Date  . ABDOMINAL HYSTERECTOMY    . BREAST REDUCTION SURGERY Bilateral   . CATARACT EXTRACTION, BILATERAL    . INCISION AND DRAINAGE BREAST ABSCESS Left   . REPLACEMENT TOTAL KNEE Bilateral   . TONSILLECTOMY AND ADENOIDECTOMY      There were no vitals filed for this visit.                     [facility] Adult PT Treatment/Exercise - 05/16/20 0001      Self-Care   Self-Care  Other Self-Care Comments    Other Self-Care Comments   education on different places to exercise that she can look into      Therapeutic Activites    Therapeutic Activities  Other Therapeutic Activities    Other  Therapeutic Activities  reviewed toilting tehcnique and patient is doing well with it      Lumbar Exercises: Aerobic   Nustep  level 1 for 5 min. seat #8      Lumbar Exercises: Seated   Long Arc Quad on Chair  Strengthening;Right;Left;1 set;15 reps;Weights    LAQ on Chair Weights (lbs)  --   2# left, 4# right   LAQ on Chair Limitations  with pelvic floor contraction and ball squeeze    Other Seated Lumbar Exercises  seated pelvic floor contraction, ball squeeze, push down on foam roll      Knee/Hip Exercises: Standing   Knee Flexion  Strengthening;Right;Left;2 sets;10 reps    Knee Flexion Limitations  2# on ankles    Hip ADduction  Strengthening;Right;Left;2 sets;10 reps    Hip ADduction Limitations  2# weight on ankles, plevic floor contraction     Hip Extension  Stengthening;Right;Left;1 set;10 reps;Knee straight    Extension Limitations  2# on ankles             PT Education - 05/16/20 1204    Education Details  toileting technique; different places to possible exercise    Person(s) Educated  Patient    Methods  Explanation;Demonstration;Verbal cues;Handout    Comprehension  Verbalized understanding;Returned  demonstration       PT Short Term Goals - 05/16/20 1159      PT SHORT TERM GOAL #1   Title  independent with initial HEP    Time  4    Period  Weeks    Status  Achieved      PT SHORT TERM GOAL #2   Title  able to feel the stool leakage due to increased  pelvic floor awareness    Time  4    Period  Weeks    Status  Achieved      PT SHORT TERM GOAL #3   Title  understand how to use vaginal moisturizers to reduce dryness    Time  4    Period  Weeks    Status  Achieved      PT SHORT TERM GOAL #4   Title  understand correct toileting technique to reduce strain on the pelvic floor    Time  4    Period  Weeks    Status  Achieved        PT Long Term Goals - 05/16/20 1231      PT LONG TERM GOAL #1   Title  independent with advanced HEP    Time  8     Period  Weeks    Status  On-going      PT LONG TERM GOAL #2   Title  pelvic floor strength rectally and vaginally >/= 3/5 holding for 15 seconds    Time  8    Period  Weeks    Status  On-going      PT LONG TERM GOAL #3   Title  fecal leakage decreased >/= 60% due to improve strength and tone of the anal sphincter muscles    Time  8    Period  Weeks    Status  On-going      PT LONG TERM GOAL #4   Title  urinary leakage decreased >/= 60% due to improve tone of pelvic floor    Time  8    Period  Weeks    Status  On-going            Plan - 05/16/20 1206    Clinical Impression Statement  Patient reports less time with urinary leakage and fecal leakage. Patient feels stronger. Patient reports she is having trouble finding a place to exercise and what to find one. Therapist gave her options to look into. Patient is working on her endurance and overall strength. Patient had one time she did not feel her leakage. Patient was not able to do knee extension with 4# on the left due to pain in left knee. Patient will benefit from skilled therapy to improve pelvic floor strength and coordination to reduce the leakage and improve weakness.    Personal Factors and Comorbidities  Fitness;Age;Time since onset of injury/illness/exacerbation;Comorbidity 3+    Comorbidities  abdominal hysterectomy; RA; Stroke    Examination-Activity Limitations  Continence;Hygiene/Grooming;Toileting    Examination-Participation Restrictions  Community Activity    Stability/Clinical Decision Making  Evolving/Moderate complexity    Rehab Potential  Good    PT Frequency  2x / week    PT Duration  8 weeks    PT Treatment/Interventions  Biofeedback;Cryotherapy;Electrical Stimulation;Moist Heat;Neuromuscular re-education;Balance training;Therapeutic exercise;Therapeutic activities;Patient/family education;Manual techniques    PT Next Visit Plan  working on abdominal and pelvic floor contraction in sitting and standing;  hip strength with pelvic floor contraction; check pelvic floor strength; srite renewal  PT Home Exercise Plan  Access Code: [email or web address]    Consulted and Agree with Plan of Care  Patient       Patient will benefit from skilled therapeutic intervention in order to improve the following deficits and impairments:  Decreased coordination, Decreased range of motion, Increased fascial restricitons, Decreased strength, Decreased activity tolerance  Visit Diagnosis: Muscle weakness (generalized)  Other lack of coordination  Incontinence of feces, unspecified fecal incontinence type  Other urinary incontinence     Problem List Patient Active Problem List   Diagnosis Date Noted  . Vitamin D deficiency 02/11/2019  . Stage 3 chronic kidney disease 02/11/2019  . Mild intermittent asthma without complication 02/63/7858  . Primary osteoarthritis of both hands 11/24/2018  . Primary osteoarthritis of both feet 11/24/2018  . DDD (degenerative disc disease), cervical 11/24/2018  . DDD (degenerative disc disease), lumbar 11/24/2018  . History of stroke involving cerebellum 10/13/2018  . Gait abnormality 10/13/2018  . 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  . Asthma 08/12/2018  . Hypothyroidism 08/12/2018  . Rheumatoid arthritis ([place]) 07/04/2018  . Essential hypertension 07/04/2018  . Hyperlipidemia 07/04/2018    Earlie Counts, PT 05/16/20 12:32 PM   [facility] [address] [place], [place], [address] Phone: [phone]   Fax:  [phone]  Name: Alexis Shelton MRN: [id number] Date of Birth: 1941/01/12

## 2020-05-18 ENCOUNTER — Other Ambulatory Visit: Payer: Self-pay

## 2020-05-18 ENCOUNTER — Ambulatory Visit: Payer: Medicare Other | Admitting: Physical Therapy

## 2020-05-18 DIAGNOSIS — R159 Full incontinence of feces: Secondary | ICD-10-CM

## 2020-05-18 DIAGNOSIS — M6281 Muscle weakness (generalized): Secondary | ICD-10-CM | POA: Diagnosis not present

## 2020-05-18 DIAGNOSIS — R278 Other lack of coordination: Secondary | ICD-10-CM

## 2020-05-18 DIAGNOSIS — N39498 Other specified urinary incontinence: Secondary | ICD-10-CM

## 2020-05-18 NOTE — Patient Instructions (Signed)
Access Code: [email or web address] URL: [email or web address] Date: 08/21/2019 Prepared by: Eulis Foster  Exercises Supine Hamstring Stretch with Strap - 1 x daily - 7 x weekly - 2 reps - 1 sets - 30 sec hold Hip Adductors and Hamstring Stretch with Strap - 1 x daily - 7 x weekly - 2 reps - 1 sets - 30 sec hold Supine Pelvic Floor Stretch - 1 x daily - 7 x weekly - 1 reps - 1 sets - 1 min hold Heel rises with counter support - 1 x daily - 7 x weekly - 2 sets - 10 reps Standing Hip Abduction with Counter Support - 1 x daily - 7 x weekly - 1 sets - 10 reps Standing Hip Extension with Counter Support - 1 x daily - 7 x weekly - 1 sets - 10 reps [facility] [address] [place], [place] [address] Phone # [phone] Fax [phone]

## 2020-05-18 NOTE — Therapy (Signed)
[facility] [address] [place], [place], [address] Phone: [phone]   Fax:  [phone]  Physical Therapy Treatment  Patient Details  Name: Alexis Shelton MRN: [id number] Date of Birth: 1941/11/11 Referring Provider (PT): Dr. Silvano Rusk   Encounter Date: 05/18/2020  PT End of Session - 05/18/20 1415    Visit Number  7    Date for PT Re-Evaluation  07/13/20    Authorization Type  medicare    PT Start Time  1230    PT Stop Time  1308    PT Time Calculation (min)  38 min    Activity Tolerance  Patient tolerated treatment well;No increased pain    Behavior During Therapy  WFL for tasks assessed/performed       Past Medical History:  Diagnosis Date  . Arthritis   . DDD (degenerative disc disease), lumbar 11/24/2018  . Dizziness   . Gallstones   . Hearing loss   . History of stroke involving cerebellum 10/13/2018  . Hypercholesteremia   . Hypertension   . Hypothyroidism   . 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  . Migraines   . Mild intermittent asthma without complication 2/70/3500  . Primary osteoarthritis of both feet 11/24/2018  . Rheumatoid arthritis ([place]) 07/04/2018  . Stage 3 chronic kidney disease 02/11/2019  . Vitamin D deficiency 02/11/2019    Past Surgical History:  Procedure Laterality Date  . ABDOMINAL HYSTERECTOMY    . BREAST REDUCTION SURGERY Bilateral   . CATARACT EXTRACTION, BILATERAL    . INCISION AND DRAINAGE BREAST ABSCESS Left   . REPLACEMENT TOTAL KNEE Bilateral   . TONSILLECTOMY AND ADENOIDECTOMY      There were no vitals filed for this visit.  Subjective Assessment - 05/18/20 1241    Subjective  no changes since last visit. I have tried the urge to void and it is helping. When I sleep I do not realize I have urinary or fecal leakage.    Patient Stated Goals  work on urinary and fecal control    Currently in Pain?  No/denies         [facility] PT Assessment - 05/18/20 0001       Assessment   Medical Diagnosis  R32, R15.9 Urinary and fecal incontinence    Referring Provider (PT)  Dr. Silvano Rusk    Onset Date/Surgical Date  --   chronic   Prior Therapy  none      Precautions   Precautions  Other (comment)    Precaution Comments  Rhuematoid Arthritis, vertigo      Restrictions   Weight Bearing Restrictions  No      Home Environment   Living Environment  Private residence      Prior Function   Level of [place]  Retired      Associate Professor   Overall Cognitive Status  Within Functional Limits for tasks assessed      Strength   Overall Strength Comments  bilateral hip strength 5/5                 Pelvic Floor Special Questions - 05/18/20 0001    Number of Vaginal Deliveries  1    Any difficulty with labor and deliveries  Yes    Urinary Leakage  Yes    Pad use  2-3 pads during the night; 1 overnight pad    Activities that cause leaking  Sneezing;Coughing    Fecal incontinence  Yes   does not feel it   External Perineal Exam  outside the rectum there was stool leakage    Pelvic Floor Internal Exam  Patient confirms identification and approves PT to assess pelvic floor and treatment    Exam Type  Rectal    Palpation  decreased anterior contraction of the rectum, vaginal contraction is circular ; tenderness located in the posterior skin    Strength  fair squeeze, definite lift        [facility] Adult PT Treatment/Exercise - 05/18/20 0001      Neuro Re-ed    Neuro Re-ed Details   tactile cues to pull the anal canal up and hold for 5 seconds 10x, Pelvic floor would fatique and and need cues to contract again.       Lumbar Exercises: Aerobic   Nustep  level 1 for 5 min. seat #8      Lumbar Exercises: Seated   Long Arc Quad on Chair  Strengthening;Right;Left;3 sets;10 reps    LAQ on Chair Weights (lbs)  2    LAQ on Chair Limitations  with pelvic floor contraction and ball squeeze      Knee/Hip Exercises:  Standing   Knee Flexion  Strengthening;Right;Left;3 sets;10 reps    Knee Flexion Limitations  2# on ankles             PT Education - 05/18/20 1315    Education Details  Access Code: [email or web address]    Person(s) Educated  Patient    Methods  Explanation;Demonstration;Verbal cues;Handout    Comprehension  Returned demonstration;Verbalized understanding       PT Short Term Goals - 05/18/20 1242      PT SHORT TERM GOAL #1   Title  independent with initial HEP    Time  4    Period  Weeks    Status  Achieved    Target Date  04/26/20      PT SHORT TERM GOAL #2   Title  able to feel the stool leakage due to increased  pelvic floor awareness    Time  4    Period  Weeks    Status  Achieved      PT SHORT TERM GOAL #3   Title  understand how to use vaginal moisturizers to reduce dryness    Time  4    Period  Weeks    Status  Achieved      PT SHORT TERM GOAL #4   Title  understand correct toileting technique to reduce strain on the pelvic floor    Time  4    Period  Weeks    Status  Achieved        PT Long Term Goals - 05/18/20 1243      PT LONG TERM GOAL #1   Title  independent with advanced HEP    Time  8    Period  Weeks    Status  On-going      PT LONG TERM GOAL #2   Title  pelvic floor strength rectally and vaginally >/= 3/5 holding for 15 seconds    Time  8    Period  Weeks    Status  On-going      PT LONG TERM GOAL #3   Title  fecal leakage decreased >/= 60% due to improve strength and tone of the anal sphincter muscles    Baseline  improved but difficult with putting a percentage on it    Time  8  Period  Weeks    Status  On-going      PT LONG TERM GOAL #4   Title  urinary leakage decreased >/= 60% due to improve tone of pelvic floor    Time  8    Period  Weeks    Status  On-going            Plan - 05/18/20 1415    Clinical Impression Statement  Pelvic floor strength is 3/5 now holding for 5 seconds with a weak lift. When her anal canal was  assessed there was moderated amount of stool around the rectum due to leakage. Patient reports she will leak stool and urine throughout the night and not realize it. Patient feels her leakage has improved but unable to put a percentage on it. She is having a bowel movement daily. When patient walks she has to hold onto the wall in therapy at times due to her balance and overall decreased endurance. Patient will benefit from skilled therapy to improve pelvic floor strength and coordination to reduce the leakage and improve weakness.    Personal Factors and Comorbidities  Fitness;Age;Time since onset of injury/illness/exacerbation;Comorbidity 3+    Comorbidities  abdominal hysterectomy; RA; Stroke    Examination-Activity Limitations  Continence;Hygiene/Grooming;Toileting    Examination-Participation Restrictions  Community Activity    Stability/Clinical Decision Making  Evolving/Moderate complexity    Rehab Potential  Good    PT Frequency  2x / week    PT Duration  8 weeks    PT Treatment/Interventions  Biofeedback;Cryotherapy;Electrical Stimulation;Moist Heat;Neuromuscular re-education;Balance training;Therapeutic exercise;Therapeutic activities;Patient/family education;Manual techniques    PT Next Visit Plan  working on abdominal and pelvic floor contraction in sitting and standing; hip strength with pelvic floor contraction; pelvic floor contraction with balance    PT Home Exercise Plan  Access Code: [email or web address]    Consulted and Agree with Plan of Care  Patient       Patient will benefit from skilled therapeutic intervention in order to improve the following deficits and impairments:  Decreased coordination, Decreased range of motion, Increased fascial restricitons, Decreased strength, Decreased activity tolerance  Visit Diagnosis: Muscle weakness (generalized)  Other lack of coordination  Incontinence of feces, unspecified fecal incontinence type  Other urinary incontinence     Problem  List Patient Active Problem List   Diagnosis Date Noted  . Vitamin D deficiency 02/11/2019  . Stage 3 chronic kidney disease 02/11/2019  . Mild intermittent asthma without complication 02/11/2019  . Primary osteoarthritis of both hands 11/24/2018  . Primary osteoarthritis of both feet 11/24/2018  . DDD (degenerative disc disease), cervical 11/24/2018  . DDD (degenerative disc disease), lumbar 11/24/2018  . History of stroke involving cerebellum 10/13/2018  . Gait abnormality 10/13/2018  . 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  . Asthma 08/12/2018  . Hypothyroidism 08/12/2018  . Rheumatoid arthritis ([facility]) 07/04/2018  . Essential hypertension 07/04/2018  . Hyperlipidemia 07/04/2018    Eulis Foster, PT 05/18/20 2:20 PM   [facility] [address], [address] [place], [place], [address] Phone: [phone]   Fax:  [phone]  Name: JANELIE GOLTZ MRN: [id number] Date of Birth: 1941-10-28

## 2020-06-14 ENCOUNTER — Ambulatory Visit (INDEPENDENT_AMBULATORY_CARE_PROVIDER_SITE_OTHER): Payer: Medicare Other | Admitting: Family Medicine

## 2020-06-14 ENCOUNTER — Encounter: Payer: Self-pay | Admitting: Family Medicine

## 2020-06-14 ENCOUNTER — Other Ambulatory Visit: Payer: Self-pay

## 2020-06-14 VITALS — BP 164/72 | HR 65

## 2020-06-14 DIAGNOSIS — N183 Chronic kidney disease, stage 3 unspecified: Secondary | ICD-10-CM | POA: Diagnosis not present

## 2020-06-14 DIAGNOSIS — R944 Abnormal results of kidney function studies: Secondary | ICD-10-CM

## 2020-06-14 DIAGNOSIS — R7989 Other specified abnormal findings of blood chemistry: Secondary | ICD-10-CM | POA: Diagnosis not present

## 2020-06-14 DIAGNOSIS — E785 Hyperlipidemia, unspecified: Secondary | ICD-10-CM

## 2020-06-14 DIAGNOSIS — E039 Hypothyroidism, unspecified: Secondary | ICD-10-CM

## 2020-06-14 DIAGNOSIS — N898 Other specified noninflammatory disorders of vagina: Secondary | ICD-10-CM | POA: Diagnosis not present

## 2020-06-14 DIAGNOSIS — M069 Rheumatoid arthritis, unspecified: Secondary | ICD-10-CM

## 2020-06-14 DIAGNOSIS — E559 Vitamin D deficiency, unspecified: Secondary | ICD-10-CM

## 2020-06-14 DIAGNOSIS — R739 Hyperglycemia, unspecified: Secondary | ICD-10-CM

## 2020-06-14 DIAGNOSIS — I10 Essential (primary) hypertension: Secondary | ICD-10-CM

## 2020-06-14 DIAGNOSIS — Z0000 Encounter for general adult medical examination without abnormal findings: Secondary | ICD-10-CM

## 2020-06-14 DIAGNOSIS — Z1231 Encounter for screening mammogram for malignant neoplasm of breast: Secondary | ICD-10-CM

## 2020-06-14 DIAGNOSIS — M1A09X0 Idiopathic chronic gout, multiple sites, without tophus (tophi): Secondary | ICD-10-CM

## 2020-06-14 DIAGNOSIS — K589 Irritable bowel syndrome without diarrhea: Secondary | ICD-10-CM

## 2020-06-14 DIAGNOSIS — Z8673 Personal history of transient ischemic attack (TIA), and cerebral infarction without residual deficits: Secondary | ICD-10-CM

## 2020-06-14 MED ORDER — ESTROGENS, CONJUGATED 0.625 MG/GM VA CREA: 1.0000 | TOPICAL_CREAM | Freq: Every day | VAGINAL | 3 refills | Status: DC

## 2020-06-14 MED ORDER — DICLOFENAC SODIUM 1 % EX GEL
4.0000 g | Freq: Four times a day (QID) | CUTANEOUS | 6 refills | Status: DC | PRN
Start: 2020-06-14 — End: 2021-09-21

## 2020-06-14 NOTE — Progress Notes (Signed)
Office Visit Note   Patient: Alexis Shelton           Date of Birth: Aug 01, 1941           MRN: [id number] Visit Date: 06/14/2020 Requested by: Eunice Blase, MD [id number] [place],  [place] [id number] PCP: Eunice Blase, MD  Subjective: Chief Complaint  Patient presents with  . Medicare Wellness    patient refused weight/height check    HPI: She is here for annual wellness exam.  She is doing better from a salivary gland and abdominal pain standpoint.  The treatment seems to the cause increasing vaginal dryness.  In the past she has used Premarin pills with good results, but wondered whether she could try cream instead.  Her arthritic fingers have been bothering her, especially her right third PIP joint.  Ice does not seem to be helping it.  We are due to recheck liver enzymes and kidney function today.  Blood pressure has been well controlled when checked elsewhere.  Thyroid function was good in April.  She is minimally symptomatic from that standpoint.  Rheumatoid arthritis has been stable off medication.  She had colon cancer screening by stool test about 2 years ago.  She is due for an eye exam and dental exam.  She needs recommendations.  She is also due for screening mammogram.  She has a living will and her son is her healthcare power of attorney.                ROS:   All other systems were reviewed and are negative.  Objective: Vital Signs: BP (!) 164/72   Pulse 65   Physical Exam:  General:  Alert and oriented, in no acute distress. Pulm:  Breathing unlabored. Psy:  Normal mood, congruent affect.  Neck: No carotid bruits, 2+ pulses.  No thyromegaly or nodules. CV: Regular rate and rhythm without murmurs, rubs, or gallops.  No peripheral edema.  2+ radial and posterior tibial pulses. Lungs: Clear to auscultation throughout with no wheezing or areas of consolidation. Abdomen: No hepatosplenomegaly, nontender. Extremities: She has arthritic fingers on  both hands.  Tender over the right third PIP joint.   Imaging: No results found.  Assessment & Plan: 1.  Wellness exam -Mammogram scheduled.  Recommendations made for dentist and eye doctor.  2.  Vaginal dryness - Premarin cream.  3.  Finger DJD and history of gout - Voltaren gel  4.  Elevated LFT and creatinine - Recheck today  5.  Hypothyroid, prediabetes. RA - All stable    Procedures: No procedures performed  No notes on file     PMFS History: Patient Active Problem List   Diagnosis Date Noted  . Vitamin D deficiency 02/11/2019  . Stage 3 chronic kidney disease 02/11/2019  . Mild intermittent asthma without complication 40/07/6760  . Primary osteoarthritis of both hands 11/24/2018  . Primary osteoarthritis of both feet 11/24/2018  . DDD (degenerative disc disease), cervical 11/24/2018  . DDD (degenerative disc disease), lumbar 11/24/2018  . History of stroke involving cerebellum 10/13/2018  . Gait abnormality 10/13/2018  . 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  . Asthma 08/12/2018  . Hypothyroidism 08/12/2018  . Rheumatoid arthritis ([place]) 07/04/2018  . Essential hypertension 07/04/2018  . Hyperlipidemia 07/04/2018   Past Medical History:  Diagnosis Date  . Arthritis   . DDD (degenerative disc disease), lumbar 11/24/2018  . Dizziness   . Gallstones   . Hearing loss   . History of stroke  involving cerebellum 10/13/2018  . Hypercholesteremia   . Hypertension   . Hypothyroidism   . 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  . Migraines   . Mild intermittent asthma without complication 02/11/2019  . Primary osteoarthritis of both feet 11/24/2018  . Rheumatoid arthritis (HCC) 07/04/2018  . Stage 3 chronic kidney disease 02/11/2019  . Vitamin D deficiency 02/11/2019    Family History  Problem Relation Age of Onset  . Stroke Mother   . Hyperlipidemia Mother   . Arthritis Mother   . Heart disease Father   . Arthritis Sister   . Bipolar disorder  Son   . Healthy Son   . Gallbladder disease Maternal Grandmother        radiation  . GER disease Maternal Grandmother   . Heart disease Paternal Uncle        x 7  . Colon cancer Neg Hx   . Esophageal cancer Neg Hx   . Rectal cancer Neg Hx   . Stomach cancer Neg Hx   . Cancer Neg Hx     Past Surgical History:  Procedure Laterality Date  . ABDOMINAL HYSTERECTOMY    . BREAST REDUCTION SURGERY Bilateral   . CATARACT EXTRACTION, BILATERAL    . INCISION AND DRAINAGE BREAST ABSCESS Left   . REPLACEMENT TOTAL KNEE Bilateral   . TONSILLECTOMY AND ADENOIDECTOMY     Social History   Occupational History  . Occupation: Retired - Sales promotion account executive    Comment: [employer]  Tobacco Use  . Smoking status: Former Smoker    Types: Cigarettes    Quit date: 1970    Years since quitting: 51.5  . Smokeless tobacco: Never Used  Vaping Use  . Vaping Use: Never used  Substance and Sexual Activity  . Alcohol use: Not Currently  . Drug use: Never  . Sexual activity: Not on file

## 2020-06-15 ENCOUNTER — Telehealth: Payer: Self-pay | Admitting: Family Medicine

## 2020-06-15 LAB — BASIC METABOLIC PANEL
BUN/Creatinine Ratio: 18 (calc) (ref 6–22)
BUN: 24 mg/dL (ref 7–25)
CO2: 28 mmol/L (ref 20–32)
Calcium: 9.6 mg/dL (ref 8.6–10.4)
Chloride: 105 mmol/L (ref 98–110)
Creat: 1.35 mg/dL — ABNORMAL HIGH (ref 0.60–0.88)
Glucose, Bld: 85 mg/dL (ref 65–99)
Potassium: 5.4 mmol/L — ABNORMAL HIGH (ref 3.5–5.3)
Sodium: 141 mmol/L (ref 135–146)

## 2020-06-15 LAB — HEPATIC FUNCTION PANEL
AG Ratio: 1.6 (calc) (ref 1.0–2.5)
ALT: 18 U/L (ref 6–29)
AST: 21 U/L (ref 10–35)
Albumin: 4.2 g/dL (ref 3.6–5.1)
Alkaline phosphatase (APISO): 57 U/L (ref 37–153)
Bilirubin, Direct: 0.1 mg/dL (ref 0.0–0.2)
Globulin: 2.7 g/dL (calc) (ref 1.9–3.7)
Indirect Bilirubin: 0.4 mg/dL (calc) (ref 0.2–1.2)
Total Bilirubin: 0.5 mg/dL (ref 0.2–1.2)
Total Protein: 6.9 g/dL (ref 6.1–8.1)

## 2020-06-15 NOTE — Telephone Encounter (Signed)
Liver function is now normal and creatinine is heading in the right direction.

## 2020-06-16 ENCOUNTER — Encounter: Payer: Self-pay | Admitting: Physical Therapy

## 2020-06-16 ENCOUNTER — Ambulatory Visit: Payer: Medicare Other | Attending: Internal Medicine | Admitting: Physical Therapy

## 2020-06-16 ENCOUNTER — Other Ambulatory Visit: Payer: Self-pay | Admitting: Family Medicine

## 2020-06-16 ENCOUNTER — Other Ambulatory Visit: Payer: Self-pay

## 2020-06-16 DIAGNOSIS — N39498 Other specified urinary incontinence: Secondary | ICD-10-CM

## 2020-06-16 DIAGNOSIS — R278 Other lack of coordination: Secondary | ICD-10-CM | POA: Diagnosis present

## 2020-06-16 DIAGNOSIS — M6281 Muscle weakness (generalized): Secondary | ICD-10-CM | POA: Diagnosis not present

## 2020-06-16 DIAGNOSIS — R159 Full incontinence of feces: Secondary | ICD-10-CM | POA: Diagnosis present

## 2020-06-16 MED ORDER — ESTRADIOL 0.1 MG/GM VA CREA: 1.0000 | TOPICAL_CREAM | Freq: Every day | VAGINAL | 12 refills | Status: DC

## 2020-06-16 NOTE — Therapy (Signed)
[facility] [address] [place], [place], [address] Phone: [phone]   Fax:  [phone]  Physical Therapy Treatment  Patient Details  Name: Alexis Shelton MRN: [id number] Date of Birth: 1941/06/10 Referring Provider (PT): Dr. Silvano Rusk   Encounter Date: 06/16/2020   PT End of Session - 06/16/20 1459    Visit Number 8    Date for PT Re-Evaluation 07/13/20    Authorization Type medicare    PT Start Time 2585    PT Stop Time 1525    PT Time Calculation (min) 40 min    Activity Tolerance Patient tolerated treatment well;No increased pain    Behavior During Therapy WFL for tasks assessed/performed           Past Medical History:  Diagnosis Date  . Arthritis   . DDD (degenerative disc disease), lumbar 11/24/2018  . Dizziness   . Gallstones   . Hearing loss   . History of stroke involving cerebellum 10/13/2018  . Hypercholesteremia   . Hypertension   . Hypothyroidism   . 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  . Migraines   . Mild intermittent asthma without complication 2/77/8242  . Primary osteoarthritis of both feet 11/24/2018  . Rheumatoid arthritis ([place]) 07/04/2018  . Stage 3 chronic kidney disease 02/11/2019  . Vitamin D deficiency 02/11/2019    Past Surgical History:  Procedure Laterality Date  . ABDOMINAL HYSTERECTOMY    . BREAST REDUCTION SURGERY Bilateral   . CATARACT EXTRACTION, BILATERAL    . INCISION AND DRAINAGE BREAST ABSCESS Left   . REPLACEMENT TOTAL KNEE Bilateral   . TONSILLECTOMY AND ADENOIDECTOMY      There were no vitals filed for this visit.   Subjective Assessment - 06/16/20 1452    Subjective I have pain in shoulder and increased vertigo. I am having the vaginal cream sent to me and have not used it. I am getting out of the house less. I am not doing all of the cooking like I used to.    Patient Stated Goals work on urinary and fecal control    Currently in Pain?  No/denies              [facility] PT Assessment - 06/16/20 0001      Strength   Overall Strength Comments bilateral hip strength 5/5                      Pelvic Floor Special Questions - 06/16/20 0001    Urinary Leakage Yes    Pad use 1 pad at night and during the day             [facility] Adult PT Treatment/Exercise - 06/16/20 0001      Lumbar Exercises: Aerobic   Nustep level 3 for 8 minutes while assessing patient      Lumbar Exercises: Seated   Sit to Stand 10 reps    Other Seated Lumbar Exercises seated press on the roll with ball squeeze holding 5 sec 10;       Lumbar Exercises: Supine   Bridge with [employer] 10 reps    Bridge with [employer] Limitations 2 reps    Isometric Hip Flexion 20 reps;5 seconds    Isometric Hip Flexion Limitations pelvic floor contraction    Other Supine Lumbar Exercises supine trunk rotation with ball squeeze and squeeze the ball 10x each side;     Other Supine Lumbar Exercises supine bilateral shoulder  flexion holding ball between hands with pelvic floor contraction 20x                    PT Short Term Goals - 05/18/20 1242      PT SHORT TERM GOAL #1   Title independent with initial HEP    Time 4    Period Weeks    Status Achieved    Target Date 04/26/20      PT SHORT TERM GOAL #2   Title able to feel the stool leakage due to increased  pelvic floor awareness    Time 4    Period Weeks    Status Achieved      PT SHORT TERM GOAL #3   Title understand how to use vaginal moisturizers to reduce dryness    Time 4    Period Weeks    Status Achieved      PT SHORT TERM GOAL #4   Title understand correct toileting technique to reduce strain on the pelvic floor    Time 4    Period Weeks    Status Achieved             PT Long Term Goals - 06/16/20 1500      PT LONG TERM GOAL #1   Title independent with advanced HEP    Time 8    Period Weeks    Status On-going      PT LONG TERM GOAL #2   Title  pelvic floor strength rectally and vaginally >/= 3/5 holding for 15 seconds    Time 8    Period Weeks    Status On-going      PT LONG TERM GOAL #3   Title fecal leakage decreased >/= 60% due to improve strength and tone of the anal sphincter muscles    Time 8    Period Weeks    Status Achieved      PT LONG TERM GOAL #4   Title urinary leakage decreased >/= 60% due to improve tone of pelvic floor    Time 8    Period Weeks    Status On-going                 Plan - 06/16/20 1524    Clinical Impression Statement Patient reports fecal leakage is 80% better. She is using 1 pad during the night and day instead of several. Patient was able to do 8 minutes on the nustep instead of 5 mintues. Patient did not have to rest with her exercises. Patient reports vertigo and discussed with patient to talk to her doctor and could go to our neurological rehab for vertigo. Therapist recommended her to see a gynelcologist or urologist for her urinary leakage. Patient will benefit from skilled therapy to improve pelvic floor strength and coordination.    Personal Factors and Comorbidities Fitness;Age;Time since onset of injury/illness/exacerbation;Comorbidity 3+    Comorbidities abdominal hysterectomy; RA; Stroke    Examination-Activity Limitations Continence;Hygiene/Grooming;Toileting    Examination-Participation Restrictions Community Activity    Stability/Clinical Decision Making Evolving/Moderate complexity    Rehab Potential Good    PT Frequency 2x / week    PT Duration 8 weeks    PT Treatment/Interventions Biofeedback;Cryotherapy;Electrical Stimulation;Moist Heat;Neuromuscular re-education;Balance training;Therapeutic exercise;Therapeutic activities;Patient/family education;Manual techniques    PT Next Visit Plan working on abdominal and pelvic floor contraction in sitting and standing; hip strength with pelvic floor contraction; pelvic floor contraction with balance    PT Home Exercise Plan  Access Code: [email or web address]  Consulted and Agree with Plan of Care Patient           Patient will benefit from skilled therapeutic intervention in order to improve the following deficits and impairments:  Decreased coordination, Decreased range of motion, Increased fascial restricitons, Decreased strength, Decreased activity tolerance  Visit Diagnosis: Muscle weakness (generalized)  Other lack of coordination  Incontinence of feces, unspecified fecal incontinence type  Other urinary incontinence     Problem List Patient Active Problem List   Diagnosis Date Noted  . Vitamin D deficiency 02/11/2019  . Stage 3 chronic kidney disease 02/11/2019  . Mild intermittent asthma without complication 02/11/2019  . Primary osteoarthritis of both hands 11/24/2018  . Primary osteoarthritis of both feet 11/24/2018  . DDD (degenerative disc disease), cervical 11/24/2018  . DDD (degenerative disc disease), lumbar 11/24/2018  . History of stroke involving cerebellum 10/13/2018  . Gait abnormality 10/13/2018  . 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  . Asthma 08/12/2018  . Hypothyroidism 08/12/2018  . Rheumatoid arthritis (HCC) 07/04/2018  . Essential hypertension 07/04/2018  . Hyperlipidemia 07/04/2018    Eulis Foster, PT 06/16/20 3:28 PM   [facility] [address] [place], [place], [address] Phone: [phone]   Fax:  [phone]  Name: Alexis Shelton MRN: [id number] Date of Birth: 1941/07/19

## 2020-06-18 ENCOUNTER — Encounter: Payer: Self-pay | Admitting: Family Medicine

## 2020-06-18 DIAGNOSIS — R42 Dizziness and giddiness: Secondary | ICD-10-CM

## 2020-06-20 ENCOUNTER — Encounter: Payer: Self-pay | Admitting: Physical Therapy

## 2020-06-20 ENCOUNTER — Ambulatory Visit: Payer: Medicare Other | Admitting: Physical Therapy

## 2020-06-20 ENCOUNTER — Other Ambulatory Visit: Payer: Self-pay | Admitting: Family Medicine

## 2020-06-20 DIAGNOSIS — R278 Other lack of coordination: Secondary | ICD-10-CM

## 2020-06-20 DIAGNOSIS — N39498 Other specified urinary incontinence: Secondary | ICD-10-CM

## 2020-06-20 DIAGNOSIS — R159 Full incontinence of feces: Secondary | ICD-10-CM

## 2020-06-20 DIAGNOSIS — M6281 Muscle weakness (generalized): Secondary | ICD-10-CM

## 2020-06-20 MED ORDER — ESTRADIOL 0.1 MG/GM VA CREA: TOPICAL_CREAM | VAGINAL | 12 refills | Status: DC

## 2020-06-20 NOTE — Therapy (Signed)
[facility] [address], [address] [place], [place], [address] Phone: [phone]   Fax:  [phone]  Physical Therapy Treatment  Patient Details  Name: Alexis Shelton MRN: [id number] Date of Birth: 1941-08-23 Referring Provider (PT): Dr. Stan Head   Encounter Date: 06/20/2020   PT End of Session - 06/20/20 1241    Visit Number 9    Date for PT Re-Evaluation 07/13/20    Authorization Type medicare    PT Start Time 1230    PT Stop Time 1308    PT Time Calculation (min) 38 min    Activity Tolerance Patient tolerated treatment well;No increased pain    Behavior During Therapy WFL for tasks assessed/performed           Past Medical History:  Diagnosis Date  . Arthritis   . DDD (degenerative disc disease), lumbar 11/24/2018  . Dizziness   . Gallstones   . Hearing loss   . History of stroke involving cerebellum 10/13/2018  . Hypercholesteremia   . Hypertension   . Hypothyroidism   . 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  . Migraines   . Mild intermittent asthma without complication 02/11/2019  . Primary osteoarthritis of both feet 11/24/2018  . Rheumatoid arthritis (HCC) 07/04/2018  . Stage 3 chronic kidney disease 02/11/2019  . Vitamin D deficiency 02/11/2019    Past Surgical History:  Procedure Laterality Date  . ABDOMINAL HYSTERECTOMY    . BREAST REDUCTION SURGERY Bilateral   . CATARACT EXTRACTION, BILATERAL    . INCISION AND DRAINAGE BREAST ABSCESS Left   . REPLACEMENT TOTAL KNEE Bilateral   . TONSILLECTOMY AND ADENOIDECTOMY      There were no vitals filed for this visit.   Subjective Assessment - 06/20/20 1238    Subjective I feel like I am getting better. I have not had an explosive episodes. I am not eating as much.    Patient Stated Goals work on urinary and fecal control    Currently in Pain? No/denies              [facility] PT Assessment - 06/20/20 0001      Assessment   Medical Diagnosis  R32, R15.9 Urinary and fecal incontinence    Referring Provider (PT) Dr. Stan Head    Prior Therapy none      Restrictions   Weight Bearing Restrictions No      Prior Function   Level of Independence Independent    Vocation Retired      Cognition   Overall Cognitive Status Within Functional Limits for tasks assessed      Posture/Postural Control   Posture/Postural Control Postural limitations      ROM / Strength   AROM / PROM / Strength AROM;PROM;Strength                      Pelvic Floor Special Questions - 06/20/20 0001    Pad use 1 pad at night and during the day             [facility] Adult PT Treatment/Exercise - 06/20/20 0001      Lumbar Exercises: Aerobic   Nustep level 3 for 8 minutes while assessing patient      Lumbar Exercises: Seated   Long Arc Quad on Chair Strengthening;10 reps;2 sets;Weights    LAQ on Chair Weights (lbs) 2    LAQ on Chair Limitations with pelvic floor contraction and ball squeeze    Sit to Stand  10 reps   fatique at end   Other Seated Lumbar Exercises seated press on the roll with ball squeeze holding 5 sec 10;       Shoulder Exercises: Seated   Flexion Strengthening;Right;Left;5 reps    Flexion Limitations squeeze ball to engage the pelvic floor, 3x5    Other Seated Exercises squeeze the ball an dpush a ball forward and backward 3x5                    PT Short Term Goals - 05/18/20 1242      PT SHORT TERM GOAL #1   Title independent with initial HEP    Time 4    Period Weeks    Status Achieved    Target Date 04/26/20      PT SHORT TERM GOAL #2   Title able to feel the stool leakage due to increased  pelvic floor awareness    Time 4    Period Weeks    Status Achieved      PT SHORT TERM GOAL #3   Title understand how to use vaginal moisturizers to reduce dryness    Time 4    Period Weeks    Status Achieved      PT SHORT TERM GOAL #4   Title understand correct toileting technique to reduce strain  on the pelvic floor    Time 4    Period Weeks    Status Achieved             PT Long Term Goals - 06/16/20 1500      PT LONG TERM GOAL #1   Title independent with advanced HEP    Time 8    Period Weeks    Status On-going      PT LONG TERM GOAL #2   Title pelvic floor strength rectally and vaginally >/= 3/5 holding for 15 seconds    Time 8    Period Weeks    Status On-going      PT LONG TERM GOAL #3   Title fecal leakage decreased >/= 60% due to improve strength and tone of the anal sphincter muscles    Time 8    Period Weeks    Status Achieved      PT LONG TERM GOAL #4   Title urinary leakage decreased >/= 60% due to improve tone of pelvic floor    Time 8    Period Weeks    Status On-going                 Plan - 06/20/20 1315    Clinical Impression Statement Patient reports her urinary leakage is the same and recommended her see a urologist. Patient reports her fecal leakage is 80% better and has not had an explosive episode. Patient needs verbal cues to relax her shoulders with  her exercise. Patient is working her core with arm and leg movements.  Patient will benefit from skilled therapy to improve pelvic floor strength and coordination.    Personal Factors and Comorbidities Fitness;Age;Time since onset of injury/illness/exacerbation;Comorbidity 3+    Comorbidities abdominal hysterectomy; RA; Stroke    Examination-Activity Limitations Continence;Hygiene/Grooming;Toileting    Examination-Participation Restrictions Community Activity    Stability/Clinical Decision Making Evolving/Moderate complexity    Rehab Potential Good    PT Frequency 2x / week    PT Duration 8 weeks    PT Treatment/Interventions Biofeedback;Cryotherapy;Electrical Stimulation;Moist Heat;Neuromuscular re-education;Balance training;Therapeutic exercise;Therapeutic activities;Patient/family education;Manual techniques    PT Next Visit  Plan working on abdominal and pelvic floor contraction in  sitting and standing; hip strength with pelvic floor contraction; pelvic floor contraction with balance; see when her appointment is with Neuro rehab. Write 10 th visit note.    PT Home Exercise Plan Access Code: [email or web address]    Consulted and Agree with Plan of Care Patient           Patient will benefit from skilled therapeutic intervention in order to improve the following deficits and impairments:  Decreased coordination, Decreased range of motion, Increased fascial restricitons, Decreased strength, Decreased activity tolerance  Visit Diagnosis: Muscle weakness (generalized)  Other lack of coordination  Incontinence of feces, unspecified fecal incontinence type  Other urinary incontinence     Problem List Patient Active Problem List   Diagnosis Date Noted  . Vitamin D deficiency 02/11/2019  . Stage 3 chronic kidney disease 02/11/2019  . Mild intermittent asthma without complication 93/57/0177  . Primary osteoarthritis of both hands 11/24/2018  . Primary osteoarthritis of both feet 11/24/2018  . DDD (degenerative disc disease), cervical 11/24/2018  . DDD (degenerative disc disease), lumbar 11/24/2018  . History of stroke involving cerebellum 10/13/2018  . Gait abnormality 10/13/2018  . 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  . Asthma 08/12/2018  . Hypothyroidism 08/12/2018  . Rheumatoid arthritis ([place]) 07/04/2018  . Essential hypertension 07/04/2018  . Hyperlipidemia 07/04/2018    Earlie Counts, PT 06/20/20 1:20 PM   [facility] [address], [place], [place], [address] Phone: [phone]   Fax:  [phone]  Name: Alexis Shelton MRN: [id number] Date of Birth: 1941/12/13

## 2020-06-21 ENCOUNTER — Encounter: Payer: Self-pay | Admitting: Family Medicine

## 2020-06-21 ENCOUNTER — Telehealth: Payer: Self-pay | Admitting: Family Medicine

## 2020-06-21 DIAGNOSIS — R32 Unspecified urinary incontinence: Secondary | ICD-10-CM

## 2020-06-21 NOTE — Telephone Encounter (Signed)
I spoke with Bonita Quin and then Hilda Lias at [facility]. They called to make sure they are processing the correct Rx for Estrace, since there were 2 sent in, 1 on 6/24 & 1 on 6/28 (with different directions). Advised both to disregard the 06/16/20 Rx and fill the 06/20/20 Rx.

## 2020-06-21 NOTE — Telephone Encounter (Signed)
Received call from [facility] -pharmacist with [facility] needing clarification on Rx Estradiol (Estrace) 4 grams a day      The number to contact Byrd Hesselbach is [phone] Opt 2  Reference # [id number]

## 2020-06-22 ENCOUNTER — Ambulatory Visit: Payer: Medicare Other | Admitting: Physical Therapy

## 2020-06-22 ENCOUNTER — Encounter: Payer: Self-pay | Admitting: Physical Therapy

## 2020-06-22 ENCOUNTER — Other Ambulatory Visit: Payer: Self-pay

## 2020-06-22 DIAGNOSIS — R159 Full incontinence of feces: Secondary | ICD-10-CM

## 2020-06-22 DIAGNOSIS — M6281 Muscle weakness (generalized): Secondary | ICD-10-CM | POA: Diagnosis not present

## 2020-06-22 DIAGNOSIS — N39498 Other specified urinary incontinence: Secondary | ICD-10-CM

## 2020-06-22 DIAGNOSIS — R278 Other lack of coordination: Secondary | ICD-10-CM

## 2020-06-22 NOTE — Therapy (Signed)
[facility] [address], [place], [place], [address] Phone: [phone]   Fax:  [phone]  Physical Therapy Treatment  Patient Details  Name: Alexis Shelton MRN: [id number] Date of Birth: Nov 01, 1941 Referring Provider (PT): Dr. Silvano Rusk  Progress Note Reporting Period 03/29/2020 to 06/22/2020  See note below for Objective Data and Assessment of Progress/Goals.      Encounter Date: 06/22/2020   PT End of Session - 06/22/20 1440    Visit Number 10    Date for PT Re-Evaluation 07/13/20    Authorization Type medicare    PT Start Time 1400    PT Stop Time 1440    PT Time Calculation (min) 40 min    Activity Tolerance Patient tolerated treatment well;No increased pain    Behavior During Therapy [facility] for tasks assessed/performed           Past Medical History:  Diagnosis Date   Arthritis    DDD (degenerative disc disease), lumbar 11/24/2018   Dizziness    Gallstones    Hearing loss    History of stroke involving cerebellum 10/13/2018   Hypercholesteremia    Hypertension    Hypothyroidism    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   Migraines    Mild intermittent asthma without complication 08/02/1750   Primary osteoarthritis of both feet 11/24/2018   Rheumatoid arthritis ([place]) 07/04/2018   Stage 3 chronic kidney disease 02/11/2019   Vitamin D deficiency 02/11/2019    Past Surgical History:  Procedure Laterality Date   ABDOMINAL HYSTERECTOMY     BREAST REDUCTION SURGERY Bilateral    CATARACT EXTRACTION, BILATERAL     INCISION AND DRAINAGE BREAST ABSCESS Left    REPLACEMENT TOTAL KNEE Bilateral    TONSILLECTOMY AND ADENOIDECTOMY      There were no vitals filed for this visit.   Subjective Assessment - 06/22/20 1408    Subjective I have an appointment with the neuro PT to assess my vertigo.    Patient Stated Goals work on urinary and fecal control    Currently in Pain?  No/denies              [facility] PT Assessment - 06/22/20 0001      Assessment   Medical Diagnosis R32, R15.9 Urinary and fecal incontinence    Referring Provider (PT) Dr. Silvano Rusk    Prior Therapy none      Restrictions   Weight Bearing Restrictions No      Prior Function   Level of Independence Independent    Vocation Retired      Cognition   Overall Cognitive Status Within Functional Limits for tasks assessed      Posture/Postural Control   Posture/Postural Control Postural limitations    Postural Limitations Rounded Shoulders;Forward head      AROM   Overall AROM Comments lumbar ROM limited by 50% due to pain and Vertigo      Strength   Overall Strength Comments bilateral hip strength 5/5                      Pelvic Floor Special Questions - 06/22/20 0001    Urinary Leakage Yes    Pad use 1 pad at night and during the day    Activities that cause leaking Sneezing;Coughing    Fecal incontinence No    Pelvic Floor Internal Exam Patient confirms identification and approves PT to assess pelvic floor and treatment    Exam  Type Rectal    Palpation decreased anterior contraction of the rectum, vaginal contraction is circular ; tenderness located in the posterior skin    Strength fair squeeze, definite lift             [facility] Adult PT Treatment/Exercise - 06/22/20 0001      Lumbar Exercises: Aerobic   Nustep level 3 for 8 minutes while assessing patient      Lumbar Exercises: Supine   Ab Set 20 reps    Bridge 10 reps    Isometric Hip Flexion 20 reps;5 seconds    Isometric Hip Flexion Limitations pelvic floor contraction                  PT Education - 06/22/20 1438    Education Details Access Code: [email or web address]    Person(s) Educated Patient    Methods Explanation;Demonstration;Verbal cues;Handout    Comprehension Returned demonstration;Verbalized understanding            PT Short Term Goals - 05/18/20 1242      PT SHORT TERM GOAL  #1   Title independent with initial HEP    Time 4    Period Weeks    Status Achieved    Target Date 04/26/20      PT SHORT TERM GOAL #2   Title able to feel the stool leakage due to increased  pelvic floor awareness    Time 4    Period Weeks    Status Achieved      PT SHORT TERM GOAL #3   Title understand how to use vaginal moisturizers to reduce dryness    Time 4    Period Weeks    Status Achieved      PT SHORT TERM GOAL #4   Title understand correct toileting technique to reduce strain on the pelvic floor    Time 4    Period Weeks    Status Achieved             PT Long Term Goals - 06/22/20 1423      PT LONG TERM GOAL #1   Title independent with advanced HEP    Time 8    Period Weeks    Status Achieved      PT LONG TERM GOAL #2   Title pelvic floor strength rectally and vaginally >/= 3/5 holding for 15 seconds    Baseline for rectal    Time 8    Period Weeks    Status Partially Met      PT LONG TERM GOAL #3   Title fecal leakage decreased >/= 60% due to improve strength and tone of the anal sphincter muscles    Time 8    Period Weeks    Status Achieved      PT LONG TERM GOAL #4   Title urinary leakage decreased >/= 60% due to improve tone of pelvic floor    Time 8    Period Weeks    Status Not Met                 Plan - 06/22/20 1416    Clinical Impression Statement Patient has met all of goals with the exception of urinary leakage. Her urinary leakage has not changed. She is not having issues with fecal incontinence or have a bowel movement. Patient is independent with her current HEP. Patient would benefit from seeing someone to reassess her urinary leakage further. Patient is having balance issues and will be going to  nuro rehab to have her balance and vertigo assesssed.    Personal Factors and Comorbidities Fitness;Age;Time since onset of injury/illness/exacerbation;Comorbidity 3+    Comorbidities abdominal hysterectomy; RA; Stroke     Examination-Activity Limitations Continence;Hygiene/Grooming;Toileting    Examination-Participation Restrictions Community Activity    Stability/Clinical Decision Making Evolving/Moderate complexity    Rehab Potential Good    PT Treatment/Interventions Biofeedback;Cryotherapy;Electrical Stimulation;Moist Heat;Neuromuscular re-education;Balance training;Therapeutic exercise;Therapeutic activities;Patient/family education;Manual techniques    PT Next Visit Plan discharge from pelvic floor rehab. Patient will be going to neuro rehab for balance and vertigo    PT Home Exercise Plan Access Code: [email or web address]    Consulted and Agree with Plan of Care Patient           Patient will benefit from skilled therapeutic intervention in order to improve the following deficits and impairments:  Decreased coordination, Decreased range of motion, Increased fascial restricitons, Decreased strength, Decreased activity tolerance  Visit Diagnosis: Muscle weakness (generalized)  Other lack of coordination  Incontinence of feces, unspecified fecal incontinence type  Other urinary incontinence     Problem List Patient Active Problem List   Diagnosis Date Noted   Vitamin D deficiency 02/11/2019   Stage 3 chronic kidney disease 02/11/2019   Mild intermittent asthma without complication 47/18/5501   Primary osteoarthritis of both hands 11/24/2018   Primary osteoarthritis of both feet 11/24/2018   DDD (degenerative disc disease), cervical 11/24/2018   DDD (degenerative disc disease), lumbar 11/24/2018   History of stroke involving cerebellum 10/13/2018   Gait abnormality 10/13/2018   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   Asthma 08/12/2018   Hypothyroidism 08/12/2018   Rheumatoid arthritis ([place]) 07/04/2018   Essential hypertension 07/04/2018   Hyperlipidemia 07/04/2018    Earlie Counts, PT 06/22/20 2:45 PM   [facility] [address], [place], [place], [address] Phone: [phone]   Fax:  [phone]  Name: Alexis Shelton MRN: [id number] Date of Birth: December 19, 1941  PHYSICAL THERAPY DISCHARGE SUMMARY  Visits from Start of Care: 10  Current functional level related to goals / functional outcomes: See above.    Remaining deficits: See above.    Education / Equipment: HEP Plan: Patient agrees to discharge.  Patient goals were partially met. Patient is being discharged due to meeting the stated rehab goals.  Thank you for the referral.Shermika Balthaser Pearline Cables, PT 06/22/20 2:45 PM  ?????

## 2020-06-22 NOTE — Patient Instructions (Signed)
Access Code: [email or web address] URL: [email or web address] Date: 06/22/2020 Prepared by: Eulis Foster  Exercises Supine Transversus Abdominis Bracing - Hands on Stomach - 1 x daily - 7 x weekly - 1 sets - 10 reps - 3 sec hold Hooklying Isometric Hip Flexion - 1 x daily - 7 x weekly - 1 sets - 10 reps - 5 sec hold Supine Pelvic Floor Contraction - 2 x daily - 7 x weekly - 1 sets - 10 reps - 5 sec hold Seated Shoulder Flexion Full Range Single Arm - 1 x daily - 7 x weekly - 2 sets - 10 reps Seated Hip Flexion March with Ankle Weights - 1 x daily - 7 x weekly - 1 sets - 10 reps Supine Bridge - 1 x daily - 7 x weekly - 1 sets - 10 reps [facility] [address], [address] [place], [place] [address] Phone # [phone] Fax [phone]

## 2020-06-23 ENCOUNTER — Encounter: Payer: Self-pay | Admitting: Physical Therapy

## 2020-06-23 ENCOUNTER — Other Ambulatory Visit: Payer: Self-pay

## 2020-06-23 ENCOUNTER — Ambulatory Visit: Payer: Medicare Other | Attending: Internal Medicine | Admitting: Physical Therapy

## 2020-06-23 VITALS — BP 153/57 | HR 68

## 2020-06-23 DIAGNOSIS — R2681 Unsteadiness on feet: Secondary | ICD-10-CM | POA: Diagnosis present

## 2020-06-23 DIAGNOSIS — R2689 Other abnormalities of gait and mobility: Secondary | ICD-10-CM | POA: Insufficient documentation

## 2020-06-23 DIAGNOSIS — R42 Dizziness and giddiness: Secondary | ICD-10-CM | POA: Insufficient documentation

## 2020-06-24 NOTE — Therapy (Signed)
[facility] [address] [place], [place], [address] Phone: [phone]   Fax:  [phone]  Physical Therapy Evaluation  Patient Details  Name: Alexis Shelton MRN: [id number] Date of Birth: 07/22/1941 Referring Provider (PT): Dr. Lavada Mesi   Encounter Date: 06/23/2020   PT End of Session - 06/24/20 1349    Visit Number 1    Number of Visits 9    Date for PT Re-Evaluation 07/24/20    Authorization Type Medicare    Authorization Time Period 06-23-20 - 08-24-20    PT Start Time 05/12/80    PT Stop Time 1318    PT Time Calculation (min) 46 min    Activity Tolerance Patient tolerated treatment well;No increased pain    Behavior During Therapy [facility] for tasks assessed/performed           Past Medical History:  Diagnosis Date   Arthritis    DDD (degenerative disc disease), lumbar 11/24/2018   Dizziness    Gallstones    Hearing loss    History of stroke involving cerebellum 10/13/2018   Hypercholesteremia    Hypertension    Hypothyroidism    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   Migraines    Mild intermittent asthma without complication 02/11/2019   Primary osteoarthritis of both feet 11/24/2018   Rheumatoid arthritis ([facility]) 07/04/2018   Stage 3 chronic kidney disease 02/11/2019   Vitamin D deficiency 02/11/2019    Past Surgical History:  Procedure Laterality Date   ABDOMINAL HYSTERECTOMY     BREAST REDUCTION SURGERY Bilateral    CATARACT EXTRACTION, BILATERAL     INCISION AND DRAINAGE BREAST ABSCESS Left    REPLACEMENT TOTAL KNEE Bilateral    TONSILLECTOMY AND ADENOIDECTOMY      Vitals:   06/23/20 1324 06/23/20 1326  BP: (!) 145/68 (!) 153/57  Pulse: (!) 58 68      Subjective Assessment - 06/23/20 1240    Subjective Pt states she moved to [place] from [place] in 2018/05/12 after her husband died, to live near her son;  pt states she has been very aware of her situation and status - selected the  apartment she is in to be able to be safe and hold walls as needed for balance ("it's a small apartment") - says soon after she moved to [place] she had episode of vertigo (approx. May 2019) - went to see ENT (doesn't remember his name) - also saw Dr. 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 TKR's, mild peripheral neuropathy    Limitations Standing;Walking;House hold activities   due to dizziness   Diagnostic tests MRI    Patient Stated Goals VESTIBULAR - reduce the dizziness and improve balance    Currently in Pain? No/denies              [facility] PT Assessment - 06/24/20 0001      Assessment   Medical Diagnosis Vertigo    Referring Provider (PT) Dr. Casimiro Needle Hilts    Onset Date/Surgical Date --   May 2019   Prior Therapy Pt states she had a couple of PT sessions last March 2020 just before they closed for the pandemic - did not have more sessions due to the closing  of the clinic       Precautions   Precautions Fall;Other (comment)   dizziness     Restrictions   Weight Bearing Restrictions No      Home Environment   Living Environment Private residence    Living Arrangements Alone    Type of Home Apartment    Home Access Level entry    Home Layout One level      Prior Function   Level of Independence Independent    Vocation Retired      Observation/Other Assessments   Focus on Therapeutic Outcomes (FOTO)  Pt's FS primary measure 52/100; risk adjusted 52/100    Other Surveys  Dizziness Handicap Inventory (DHI)    Dizziness Handicap Inventory (DHI)  62%   severe handicap group     AROM   Overall AROM  Within functional limits for tasks performed      Strength   Overall Strength Within functional limits for tasks performed      Ambulation/Gait    Ambulation/Gait Yes    Ambulation/Gait Assistance 5: Supervision    Ambulation Distance (Feet) 100 Feet    Assistive device Straight cane    Gait Pattern Step-through pattern    Ambulation Surface Level;Indoor                  Vestibular Assessment - 06/24/20 0001      Vestibular Assessment   General Observation pt is an 79 yr old lady with c/o vertigo that started in 2019, soon after moving to [place] from [place]       Symptom Behavior   Subjective history of current problem pt states dizziness occurs spontaneously - in sitting or standing     Type of Dizziness  Blurred vision;Imbalance;Unsteady with head/body turns;Spinning;Lightheadedness;"World moves"    Frequency of Dizziness consistently occurs in standing or walking    Duration of Dizziness depends on the activity     Symptom Nature Motion provoked    Aggravating Factors Activity in general;Forward bending    Relieving Factors Rest;Comments;Lying supine   sitting - not moving   Progression of Symptoms Worse      Oculomotor Exam   Oculomotor Alignment Normal    Spontaneous Absent    Smooth Pursuits Intact    Saccades Intact    Comment no dizziness reported with testing       Oculomotor Exam-Fixation Suppressed    Ocular Alignment normal      Positional Testing   Sidelying Test Sidelying Right;Sidelying Left      Sidelying Right   Sidelying Right Duration none    Sidelying Right Symptoms No nystagmus      Sidelying Left   Sidelying Left Duration none    Sidelying Left Symptoms No nystagmus      Positional Sensitivities   Pivot Right in Standing No dizziness    Pivot Left in Standing No dizziness    Rolling Right No dizziness    Rolling Left No dizziness              Objective measurements completed on examination: See above findings.               PT Education - 06/24/20 1319    Education Details eval results - pt may have orthostatic hypotension as well as vestibular hypofunction and  also neuropathy contributing to imbalance    Person(s) Educated Patient    Methods Explanation    Comprehension Verbalized understanding  PT Long Term Goals - 06/24/20 1415      PT LONG TERM GOAL #1   Title Pt will improve DHI score from 62% to </= 40% to demo improvement in dizziness.    Baseline 62% on 06-23-20    Time 4    Period Weeks    Status New    Target Date 07/29/20      PT LONG TERM GOAL #2   Title Pt will be independent in HEP for balance and vestibular exercises.    Time 4    Period Weeks    Status New    Target Date 07/29/20      PT LONG TERM GOAL #3   Title Pt will demonstrate improved vestibular input in maintaining balance by being able to stand on floor with EC for 15 secs without LOB.    Baseline approx. 3 secs prior to LOB    Time 4    Period Weeks    Status New    Target Date 07/29/20      PT LONG TERM GOAL #4   Title Pt will demonstrate technique to reduce light-headedness due to orthostatic hypotension, i.e. ankle pumps and stepping in place.    Time 4    Period Weeks    Status New    Target Date 07/29/20                  Plan - 06/24/20 1351    Clinical Impression Statement Pt is an 79 yr old lady with c/o intermittent dizziness which has been worsening since May 2019; pt reports she has hearing loss in Lt ear after undergoing abdominal surgery in 1990.  Pt report she has dizziness with sitting and standing up and also reports imbalance in which she must hold the walls to steady herself.  Pt may have orthostatic hypotension which results in light-headedness and dizziness with transitional movements and also has signs of vestibular hypofunction as she is unable to maintain balance with EC standing on the floor - peripheral neuropathy probably contributing to her dysequilibrium as well as vestibular hypofunction.    Personal Factors and Comorbidities Fitness;Age;Time since onset of injury/illness/exacerbation;Comorbidity 2     Comorbidities abdominal hysterectomy with hearing loss Lt ear after surgery, RA; h/o CVA involving cerebellum, HTN, idiopathic gout, OA hands and feet, DDD cervical and lumbar, stage 3 CKD, s/p bil. TKR's, peripheral neuropathy    Examination-Activity Limitations Hygiene/Grooming;Locomotion Level;Transfers;Bed Mobility;Bend;Stand    Examination-Participation Restrictions Community Activity;Cleaning;Meal Prep;Shop;Laundry    Stability/Clinical Decision Making Evolving/Moderate complexity    Rehab Potential Good    PT Frequency 2x / week    PT Duration 4 weeks   + eval   PT Treatment/Interventions Neuromuscular re-education;Balance training;Therapeutic exercise;Therapeutic activities;Patient/family education;Vestibular;ADLs/Self Care Home Management;Gait training;Stair training    PT Next Visit Plan do orthostatics assessment - begin balance HEP    Consulted and Agree with Plan of Care Patient           Patient will benefit from skilled therapeutic intervention in order to improve the following deficits and impairments:  Dizziness, Difficulty walking, Decreased balance, Impaired sensation  Visit Diagnosis: Dizziness and giddiness - Plan: PT plan of care cert/re-cert  Other abnormalities of gait and mobility - Plan: PT plan of care cert/re-cert  Unsteadiness on feet - Plan: PT plan of care cert/re-cert     Problem List Patient Active Problem List   Diagnosis Date Noted   Vitamin D deficiency 02/11/2019   Stage 3 chronic kidney disease  02/11/2019   Mild intermittent asthma without complication 02/11/2019   Primary osteoarthritis of both hands 11/24/2018   Primary osteoarthritis of both feet 11/24/2018   DDD (degenerative disc disease), cervical 11/24/2018   DDD (degenerative disc disease), lumbar 11/24/2018   History of stroke involving cerebellum 10/13/2018   Gait abnormality 10/13/2018   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   Asthma 08/12/2018    Hypothyroidism 08/12/2018   Rheumatoid arthritis ([facility]CC) 07/04/2018   Essential hypertension 07/04/2018   Hyperlipidemia 07/04/2018    Chloey Ricard, Donavan Burnet, PT 06/24/2020, 2:28 PM  [facility] [address] [place], [place], [address] Phone: [phone]   Fax:  [phone]  Name: Alexis Shelton MRN: [id number] Date of Birth: [birth year]

## 2020-06-29 ENCOUNTER — Encounter: Payer: Medicare Other | Admitting: Physical Therapy

## 2020-06-30 ENCOUNTER — Encounter: Payer: Medicare Other | Admitting: Physical Therapy

## 2020-06-30 ENCOUNTER — Ambulatory Visit: Payer: Medicare Other | Admitting: Physical Therapy

## 2020-07-04 ENCOUNTER — Encounter: Payer: Medicare Other | Admitting: Physical Therapy

## 2020-07-05 ENCOUNTER — Ambulatory Visit
Admission: RE | Admit: 2020-07-05 | Discharge: 2020-07-05 | Disposition: A | Discharge status: Home / Self Care | Payer: Medicare Other | Source: Ambulatory Visit | Attending: Family Medicine | Admitting: Family Medicine

## 2020-07-05 ENCOUNTER — Other Ambulatory Visit: Payer: Self-pay

## 2020-07-05 DIAGNOSIS — Z0000 Encounter for general adult medical examination without abnormal findings: Secondary | ICD-10-CM

## 2020-07-05 DIAGNOSIS — Z1231 Encounter for screening mammogram for malignant neoplasm of breast: Secondary | ICD-10-CM

## 2020-07-07 ENCOUNTER — Encounter: Payer: Medicare Other | Admitting: Physical Therapy

## 2020-07-11 ENCOUNTER — Encounter: Payer: Medicare Other | Admitting: Physical Therapy

## 2020-07-12 ENCOUNTER — Ambulatory Visit: Payer: Medicare Other | Admitting: Physical Therapy

## 2020-07-12 MED ORDER — NITROFURANTOIN MONOHYD MACRO 100 MG PO CAPS: 100.0000 mg | ORAL_CAPSULE | Freq: Two times a day (BID) | ORAL | 0 refills | Status: DC

## 2020-07-12 NOTE — Addendum Note (Signed)
Addended by: Lillia Carmel on: 07/12/2020 04:43 PM   Modules accepted: Orders

## 2020-07-13 ENCOUNTER — Ambulatory Visit: Payer: Medicare Other | Admitting: Physical Therapy

## 2020-07-21 ENCOUNTER — Ambulatory Visit: Payer: Medicare Other | Admitting: Physical Therapy

## 2020-07-28 ENCOUNTER — Encounter: Payer: Medicare Other | Admitting: Physical Therapy

## 2020-08-02 ENCOUNTER — Encounter: Payer: Medicare Other | Admitting: Physical Therapy

## 2020-08-04 ENCOUNTER — Encounter: Payer: Medicare Other | Admitting: Physical Therapy

## 2020-08-11 ENCOUNTER — Telehealth: Payer: Self-pay | Admitting: Family Medicine

## 2020-08-11 NOTE — Telephone Encounter (Signed)
Patient called requesting a refill of losartan. Patient states she misplaced this medication and has not taken this medication and would like a refill as soon as possible. Please send script to [facility] at [address] [facility]. Patient phone number is [phone].

## 2020-08-11 NOTE — Telephone Encounter (Signed)
Please advise 

## 2020-08-12 MED ORDER — LOSARTAN POTASSIUM 50 MG PO TABS: 50.0000 mg | ORAL_TABLET | Freq: Every day | ORAL | 1 refills | Status: DC

## 2020-08-12 NOTE — Telephone Encounter (Signed)
Rx sent 

## 2020-08-12 NOTE — Telephone Encounter (Signed)
Pt informed

## 2020-08-16 ENCOUNTER — Encounter: Payer: Medicare Other | Admitting: Physical Therapy

## 2020-08-18 ENCOUNTER — Encounter: Payer: Medicare Other | Admitting: Physical Therapy

## 2020-08-19 ENCOUNTER — Encounter: Payer: Self-pay | Admitting: Family Medicine

## 2020-08-19 DIAGNOSIS — R809 Proteinuria, unspecified: Secondary | ICD-10-CM | POA: Insufficient documentation

## 2020-08-19 DIAGNOSIS — E669 Obesity, unspecified: Secondary | ICD-10-CM | POA: Insufficient documentation

## 2020-08-24 ENCOUNTER — Ambulatory Visit: Payer: Medicare Other | Admitting: Podiatry

## 2020-09-05 ENCOUNTER — Ambulatory Visit: Payer: Medicare Other | Admitting: Podiatry

## 2020-09-08 ENCOUNTER — Other Ambulatory Visit: Payer: Self-pay | Admitting: Family Medicine

## 2020-09-13 ENCOUNTER — Ambulatory Visit (INDEPENDENT_AMBULATORY_CARE_PROVIDER_SITE_OTHER): Payer: Medicare Other | Admitting: Podiatry

## 2020-09-13 ENCOUNTER — Other Ambulatory Visit: Payer: Self-pay

## 2020-09-13 VITALS — BP 143/75 | HR 65 | Temp 98.3°F

## 2020-09-13 DIAGNOSIS — M79674 Pain in right toe(s): Secondary | ICD-10-CM | POA: Diagnosis not present

## 2020-09-13 DIAGNOSIS — L603 Nail dystrophy: Secondary | ICD-10-CM

## 2020-09-13 DIAGNOSIS — B351 Tinea unguium: Secondary | ICD-10-CM | POA: Diagnosis not present

## 2020-09-13 DIAGNOSIS — M79675 Pain in left toe(s): Secondary | ICD-10-CM | POA: Diagnosis not present

## 2020-09-13 MED ORDER — CICLOPIROX 8 % EX SOLN: Freq: Every day | CUTANEOUS | 4 refills | Status: DC

## 2020-09-14 ENCOUNTER — Other Ambulatory Visit: Payer: Self-pay | Admitting: Family Medicine

## 2020-09-14 ENCOUNTER — Ambulatory Visit: Payer: Medicare Other | Admitting: Urology

## 2020-09-14 MED ORDER — LEVOTHYROXINE SODIUM 50 MCG PO TABS: 50.0000 ug | ORAL_TABLET | Freq: Every day | ORAL | 0 refills | Status: DC

## 2020-09-14 MED ORDER — ATORVASTATIN CALCIUM 20 MG PO TABS
20.0000 mg | ORAL_TABLET | Freq: Every day | ORAL | 3 refills | Status: DC
Start: 2020-09-14 — End: 2021-09-21
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
## 2020-09-22 DIAGNOSIS — R350 Frequency of micturition: Secondary | ICD-10-CM | POA: Diagnosis not present

## 2020-09-23 ENCOUNTER — Telehealth: Payer: Self-pay | Admitting: Family Medicine

## 2020-09-23 MED ORDER — NITROFURANTOIN MONOHYD MACRO 100 MG PO CAPS
100.0000 mg | ORAL_CAPSULE | Freq: Two times a day (BID) | ORAL | 0 refills | Status: DC
Start: 2020-09-23 — End: 2021-02-14

## 2020-09-23 NOTE — Telephone Encounter (Signed)
Rx sent 

## 2020-09-23 NOTE — Telephone Encounter (Signed)
Please advise. You last gave her Macrobid before, at the end of June.

## 2020-09-23 NOTE — Addendum Note (Signed)
Addended by: Lillia Carmel on: 09/23/2020 04:54 PM   Modules accepted: Orders

## 2020-09-23 NOTE — Telephone Encounter (Signed)
Patient called.   Said she has a kidney infection and needs antibiotics.   Call back: [phone]

## 2020-09-23 NOTE — Telephone Encounter (Signed)
I called and advised the patient. 

## 2020-09-28 ENCOUNTER — Other Ambulatory Visit: Payer: Self-pay | Admitting: Family Medicine

## 2020-09-28 MED ORDER — LEVOTHYROXINE SODIUM 50 MCG PO TABS: 50.0000 ug | ORAL_TABLET | Freq: Every day | ORAL | 0 refills | Status: DC

## 2020-09-29 ENCOUNTER — Other Ambulatory Visit: Payer: Self-pay | Admitting: Family Medicine

## 2020-10-03 ENCOUNTER — Ambulatory Visit: Payer: Medicare Other | Admitting: Urology

## 2020-10-03 ENCOUNTER — Other Ambulatory Visit: Payer: Self-pay | Admitting: Family Medicine

## 2020-10-03 MED ORDER — LEVOTHYROXINE SODIUM 100 MCG PO TABS: 100.0000 ug | ORAL_TABLET | Freq: Every day | ORAL | 1 refills | Status: DC

## 2020-10-06 ENCOUNTER — Other Ambulatory Visit: Payer: Self-pay | Admitting: Family Medicine

## 2020-10-07 ENCOUNTER — Other Ambulatory Visit: Payer: Self-pay | Admitting: Family Medicine

## 2020-10-11 ENCOUNTER — Other Ambulatory Visit: Payer: Self-pay | Admitting: Family Medicine

## 2020-10-19 ENCOUNTER — Encounter: Payer: Self-pay | Admitting: Family Medicine

## 2020-10-20 MED ORDER — AMLODIPINE BESYLATE 10 MG PO TABS: ORAL_TABLET | ORAL | 6 refills | Status: DC

## 2020-10-20 NOTE — Addendum Note (Signed)
Addended by: Lillia Carmel on: 10/20/2020 08:26 AM   Modules accepted: Orders

## 2020-10-26 MED ORDER — METOPROLOL TARTRATE 25 MG PO TABS
25.0000 mg | ORAL_TABLET | Freq: Two times a day (BID) | ORAL | 6 refills | Status: DC
Start: 2020-10-26 — End: 2021-02-15

## 2020-10-26 NOTE — Addendum Note (Signed)
Addended by: Lillia Carmel on: 10/26/2020 11:57 AM   Modules accepted: Orders

## 2020-11-03 DIAGNOSIS — N3946 Mixed incontinence: Secondary | ICD-10-CM | POA: Diagnosis not present

## 2020-11-20 DIAGNOSIS — Z23 Encounter for immunization: Secondary | ICD-10-CM | POA: Diagnosis not present

## 2020-12-01 ENCOUNTER — Other Ambulatory Visit: Payer: Self-pay | Admitting: Family Medicine

## 2020-12-13 ENCOUNTER — Ambulatory Visit: Payer: Medicare Other | Admitting: Podiatry

## 2021-01-02 ENCOUNTER — Ambulatory Visit: Payer: Medicare Other | Admitting: Podiatry

## 2021-01-23 ENCOUNTER — Ambulatory Visit (INDEPENDENT_AMBULATORY_CARE_PROVIDER_SITE_OTHER): Payer: Medicare Other | Admitting: Podiatry

## 2021-01-23 ENCOUNTER — Other Ambulatory Visit: Payer: Self-pay

## 2021-01-23 DIAGNOSIS — M79675 Pain in left toe(s): Secondary | ICD-10-CM

## 2021-01-23 DIAGNOSIS — B351 Tinea unguium: Secondary | ICD-10-CM | POA: Diagnosis not present

## 2021-01-23 DIAGNOSIS — M79674 Pain in right toe(s): Secondary | ICD-10-CM

## 2021-01-23 NOTE — Progress Notes (Signed)
Subjective: 80 year old female presents the office today for follow evaluation of onychomycosis For nails patient states they are thick elongated she cannot do them herself.  She states the Penlac has been working well is much improved her left big toenail and the color.  Denies any redness or drainage or swelling to the toe. Denies any systemic complaints such as fevers, chills, nausea, vomiting. No acute changes since last appointment, and no other complaints at this time.   Objective: AAO x3, NAD DP/PT pulses palpable bilaterally, CRT less than 3 seconds Nails continue be hypertrophic, dystrophic with yellow-brown discoloration most noted the left hallux toenail although does appear to be much improved compared to last appointment.  She does have the nails become tender particular shoes and pressure.  No redness or drainage or any swelling.  No pain with calf compression, swelling, warmth, erythema  Assessment: Onychomycosis, symptomatic  Plan: -All treatment options discussed with the patient including all alternatives, risks, complications.  -Debrided the nails x10 without any complications or bleeding.  Continue with Penlac. -Patient encouraged to call the office with any questions, concerns, change in symptoms.   Vivi Barrack DPM

## 2021-01-30 ENCOUNTER — Other Ambulatory Visit: Payer: Self-pay | Admitting: Family Medicine

## 2021-02-14 ENCOUNTER — Other Ambulatory Visit: Payer: Self-pay

## 2021-02-14 ENCOUNTER — Encounter: Payer: Self-pay | Admitting: Family Medicine

## 2021-02-14 ENCOUNTER — Ambulatory Visit (INDEPENDENT_AMBULATORY_CARE_PROVIDER_SITE_OTHER): Payer: Medicare Other | Admitting: Family Medicine

## 2021-02-14 VITALS — BP 175/74 | HR 62

## 2021-02-14 DIAGNOSIS — M25552 Pain in left hip: Secondary | ICD-10-CM | POA: Diagnosis not present

## 2021-02-14 MED ORDER — CLOBETASOL PROPIONATE 0.05 % EX SHAM: 1.0000 "application " | MEDICATED_SHAMPOO | Freq: Two times a day (BID) | CUTANEOUS | 6 refills | Status: DC
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
## 2021-02-15 ENCOUNTER — Other Ambulatory Visit: Payer: Self-pay | Admitting: Family Medicine

## 2021-02-16 DIAGNOSIS — L218 Other seborrheic dermatitis: Secondary | ICD-10-CM | POA: Diagnosis not present

## 2021-02-16 DIAGNOSIS — L821 Other seborrheic keratosis: Secondary | ICD-10-CM | POA: Diagnosis not present

## 2021-02-16 DIAGNOSIS — L814 Other melanin hyperpigmentation: Secondary | ICD-10-CM | POA: Diagnosis not present

## 2021-02-18 ENCOUNTER — Encounter: Payer: Self-pay | Admitting: Family Medicine

## 2021-02-18 DIAGNOSIS — R739 Hyperglycemia, unspecified: Secondary | ICD-10-CM

## 2021-02-18 DIAGNOSIS — M1A09X0 Idiopathic chronic gout, multiple sites, without tophus (tophi): Secondary | ICD-10-CM

## 2021-02-18 DIAGNOSIS — E039 Hypothyroidism, unspecified: Secondary | ICD-10-CM

## 2021-02-18 DIAGNOSIS — E785 Hyperlipidemia, unspecified: Secondary | ICD-10-CM

## 2021-02-18 DIAGNOSIS — E559 Vitamin D deficiency, unspecified: Secondary | ICD-10-CM

## 2021-02-18 DIAGNOSIS — I10 Essential (primary) hypertension: Secondary | ICD-10-CM

## 2021-02-18 DIAGNOSIS — R7989 Other specified abnormal findings of blood chemistry: Secondary | ICD-10-CM

## 2021-02-18 DIAGNOSIS — Z0000 Encounter for general adult medical examination without abnormal findings: Secondary | ICD-10-CM

## 2021-02-18 DIAGNOSIS — N183 Chronic kidney disease, stage 3 unspecified: Secondary | ICD-10-CM

## 2021-02-24 ENCOUNTER — Other Ambulatory Visit: Payer: Self-pay | Admitting: Family Medicine

## 2021-02-28 ENCOUNTER — Telehealth: Payer: Self-pay

## 2021-02-28 NOTE — Telephone Encounter (Signed)
Pt called regarding the labs that Dr. Prince Rome put in for blood work. Pt would like a call back regarding these

## 2021-02-28 NOTE — Telephone Encounter (Signed)
I called and left voice mail to call me back.

## 2021-03-01 ENCOUNTER — Telehealth: Payer: Self-pay | Admitting: Family Medicine

## 2021-03-01 NOTE — Telephone Encounter (Signed)
Left another message to call me back (call goes straight to voice mail). Asked her to please say full reason of call when she calls back or she can try sending a message through MyChart.

## 2021-03-01 NOTE — Telephone Encounter (Signed)
Pt called and was returning terri phone call. Please call her when you get a moment [phone]

## 2021-03-01 NOTE — Telephone Encounter (Signed)
I called and it went straight to voice mail again. Left another message to give me a call back.

## 2021-03-01 NOTE — Telephone Encounter (Signed)
Duplicate- see other message

## 2021-03-02 NOTE — Telephone Encounter (Signed)
I reached the patient on her home phone. Scheduled her for fasting labs on 03/06/21 at 11:00.

## 2021-03-06 ENCOUNTER — Other Ambulatory Visit: Payer: Self-pay | Admitting: Family Medicine

## 2021-03-06 ENCOUNTER — Ambulatory Visit (INDEPENDENT_AMBULATORY_CARE_PROVIDER_SITE_OTHER): Payer: Medicare Other

## 2021-03-06 ENCOUNTER — Other Ambulatory Visit: Payer: Self-pay

## 2021-03-06 DIAGNOSIS — R7989 Other specified abnormal findings of blood chemistry: Secondary | ICD-10-CM | POA: Diagnosis not present

## 2021-03-06 DIAGNOSIS — R739 Hyperglycemia, unspecified: Secondary | ICD-10-CM | POA: Diagnosis not present

## 2021-03-06 DIAGNOSIS — N183 Chronic kidney disease, stage 3 unspecified: Secondary | ICD-10-CM

## 2021-03-06 DIAGNOSIS — M1A09X0 Idiopathic chronic gout, multiple sites, without tophus (tophi): Secondary | ICD-10-CM

## 2021-03-06 DIAGNOSIS — Z0000 Encounter for general adult medical examination without abnormal findings: Secondary | ICD-10-CM | POA: Diagnosis not present

## 2021-03-06 DIAGNOSIS — E559 Vitamin D deficiency, unspecified: Secondary | ICD-10-CM

## 2021-03-06 DIAGNOSIS — E039 Hypothyroidism, unspecified: Secondary | ICD-10-CM

## 2021-03-06 DIAGNOSIS — E785 Hyperlipidemia, unspecified: Secondary | ICD-10-CM

## 2021-03-06 DIAGNOSIS — I10 Essential (primary) hypertension: Secondary | ICD-10-CM | POA: Diagnosis not present

## 2021-03-06 MED ORDER — KETOCONAZOLE 2 % EX SHAM: 1.0000 "application " | MEDICATED_SHAMPOO | CUTANEOUS | 11 refills | Status: DC

## 2021-03-06 NOTE — Progress Notes (Signed)
Lab visit (fasting): vit D, uric acid, thyroid panel + TSH, lipid panel, BMET, hepatic function panel, HgbA1C, hs CRP, CBC/diff & UA w/reflex micro.

## 2021-03-07 LAB — LIPID PANEL
Cholesterol: 169 mg/dL (ref <200)
HDL: 38 mg/dL — ABNORMAL LOW (ref >=50)
LDL Cholesterol (Calc): 101 mg/dL (calc) — ABNORMAL HIGH
Non-HDL Cholesterol (Calc): 131 mg/dL (calc) — ABNORMAL HIGH (ref <130)
Total CHOL/HDL Ratio: 4.4 (calc) (ref <5.0)
Triglycerides: 182 mg/dL — ABNORMAL HIGH (ref <150)

## 2021-03-07 LAB — URINE CULTURE
MICRO NUMBER:: 11643545
SPECIMEN QUALITY:: ADEQUATE

## 2021-03-07 LAB — CBC WITH DIFFERENTIAL/PLATELET
Absolute Monocytes: 555 cells/uL (ref 200–950)
Basophils Absolute: 30 cells/uL (ref 0–200)
Basophils Relative: 0.5 %
Eosinophils Absolute: 407 cells/uL (ref 15–500)
Eosinophils Relative: 6.9 %
HCT: 39.8 % (ref 35.0–45.0)
Hemoglobin: 13.3 g/dL (ref 11.7–15.5)
Lymphs Abs: 2036 cells/uL (ref 850–3900)
MCH: 31.7 pg (ref 27.0–33.0)
MCHC: 33.4 g/dL (ref 32.0–36.0)
MCV: 95 fL (ref 80.0–100.0)
MPV: 10.2 fL (ref 7.5–12.5)
Monocytes Relative: 9.4 %
Neutro Abs: 2873 cells/uL (ref 1500–7800)
Neutrophils Relative %: 48.7 %
Platelets: 234 10*3/uL (ref 140–400)
RBC: 4.19 10*6/uL (ref 3.80–5.10)
RDW: 13.4 % (ref 11.0–15.0)
Total Lymphocyte: 34.5 %
WBC: 5.9 10*3/uL (ref 3.8–10.8)

## 2021-03-07 LAB — BASIC METABOLIC PANEL
BUN/Creatinine Ratio: 16 (calc) (ref 6–22)
BUN: 23 mg/dL (ref 7–25)
CO2: 26 mmol/L (ref 20–32)
Calcium: 9.5 mg/dL (ref 8.6–10.4)
Chloride: 108 mmol/L (ref 98–110)
Creat: 1.4 mg/dL — ABNORMAL HIGH (ref 0.60–0.88)
Glucose, Bld: 88 mg/dL (ref 65–99)
Potassium: 5 mmol/L (ref 3.5–5.3)
Sodium: 141 mmol/L (ref 135–146)

## 2021-03-07 LAB — HEMOGLOBIN A1C
Hgb A1c MFr Bld: 5.7 % of total Hgb — ABNORMAL HIGH (ref <5.7)
Mean Plasma Glucose: 117 mg/dL
eAG (mmol/L): 6.5 mmol/L

## 2021-03-07 LAB — THYROID PANEL WITH TSH
Free Thyroxine Index: 2 (ref 1.4–3.8)
T3 Uptake: 32 % (ref 22–35)
T4, Total: 6.4 ug/dL (ref 5.1–11.9)
TSH: 0.44 mIU/L (ref 0.40–4.50)

## 2021-03-07 LAB — HIGH SENSITIVITY CRP: hs-CRP: 4.4 mg/L — ABNORMAL HIGH

## 2021-03-07 LAB — HEPATIC FUNCTION PANEL
AG Ratio: 1.4 (calc) (ref 1.0–2.5)
ALT: 15 U/L (ref 6–29)
AST: 20 U/L (ref 10–35)
Albumin: 3.9 g/dL (ref 3.6–5.1)
Alkaline phosphatase (APISO): 61 U/L (ref 37–153)
Bilirubin, Direct: 0.1 mg/dL (ref 0.0–0.2)
Globulin: 2.8 g/dL (calc) (ref 1.9–3.7)
Indirect Bilirubin: 0.3 mg/dL (calc) (ref 0.2–1.2)
Total Bilirubin: 0.4 mg/dL (ref 0.2–1.2)
Total Protein: 6.7 g/dL (ref 6.1–8.1)

## 2021-03-07 LAB — VITAMIN D 25 HYDROXY (VIT D DEFICIENCY, FRACTURES): Vit D, 25-Hydroxy: 58 ng/mL (ref 30–100)

## 2021-03-07 LAB — URINALYSIS, ROUTINE W REFLEX MICROSCOPIC

## 2021-03-07 LAB — URIC ACID: Uric Acid, Serum: 6 mg/dL (ref 2.5–7.0)

## 2021-03-08 ENCOUNTER — Telehealth: Payer: Self-pay | Admitting: Family Medicine

## 2021-03-08 NOTE — Telephone Encounter (Signed)
Labs are notable for the following:  Urine culture did not grow a specific pathogenic bacteria.  CBC is normal, vitamin D is in good range.  Uric acid level looks good at 6.0.  Thyroid function looks good.  Lipid panel shows elevated triglycerides which will improve with limitation of processed carbohydrates and sweets.  Kidney function/creatinine is slightly higher than last time but is holding steady at 1.40.  Recheck in about 3 months.  Liver function is normal and hemoglobin A1c is improving at 5.7. Great job!  CRP, inflammation marker, is elevated at 4.  This can be due to a variety of factors.  Recheck in 3 months to be sure it is normalized.

## 2021-03-14 ENCOUNTER — Encounter: Payer: Self-pay | Admitting: Physical Therapy

## 2021-03-14 ENCOUNTER — Ambulatory Visit: Payer: Medicare Other | Attending: Family Medicine | Admitting: Physical Therapy

## 2021-03-14 ENCOUNTER — Other Ambulatory Visit: Payer: Self-pay

## 2021-03-14 DIAGNOSIS — R159 Full incontinence of feces: Secondary | ICD-10-CM | POA: Insufficient documentation

## 2021-03-14 DIAGNOSIS — M545 Low back pain, unspecified: Secondary | ICD-10-CM | POA: Diagnosis not present

## 2021-03-14 DIAGNOSIS — M6281 Muscle weakness (generalized): Secondary | ICD-10-CM | POA: Insufficient documentation

## 2021-03-14 DIAGNOSIS — R262 Difficulty in walking, not elsewhere classified: Secondary | ICD-10-CM | POA: Insufficient documentation

## 2021-03-14 DIAGNOSIS — R2689 Other abnormalities of gait and mobility: Secondary | ICD-10-CM | POA: Insufficient documentation

## 2021-03-14 DIAGNOSIS — R42 Dizziness and giddiness: Secondary | ICD-10-CM | POA: Insufficient documentation

## 2021-03-14 DIAGNOSIS — N39498 Other specified urinary incontinence: Secondary | ICD-10-CM | POA: Diagnosis not present

## 2021-03-14 DIAGNOSIS — R278 Other lack of coordination: Secondary | ICD-10-CM | POA: Diagnosis not present

## 2021-03-14 DIAGNOSIS — R2681 Unsteadiness on feet: Secondary | ICD-10-CM | POA: Diagnosis not present
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
## 2021-03-22 ENCOUNTER — Other Ambulatory Visit: Payer: Self-pay

## 2021-03-22 ENCOUNTER — Encounter: Payer: Self-pay | Admitting: Physical Therapy

## 2021-03-22 ENCOUNTER — Ambulatory Visit: Payer: Medicare Other | Admitting: Physical Therapy

## 2021-03-22 DIAGNOSIS — R262 Difficulty in walking, not elsewhere classified: Secondary | ICD-10-CM

## 2021-03-22 DIAGNOSIS — M6281 Muscle weakness (generalized): Secondary | ICD-10-CM | POA: Diagnosis not present

## 2021-03-22 DIAGNOSIS — M545 Low back pain, unspecified: Secondary | ICD-10-CM

## 2021-03-22 DIAGNOSIS — R2681 Unsteadiness on feet: Secondary | ICD-10-CM | POA: Diagnosis not present

## 2021-03-22 DIAGNOSIS — R2689 Other abnormalities of gait and mobility: Secondary | ICD-10-CM | POA: Diagnosis not present

## 2021-03-22 DIAGNOSIS — N39498 Other specified urinary incontinence: Secondary | ICD-10-CM

## 2021-03-22 DIAGNOSIS — R278 Other lack of coordination: Secondary | ICD-10-CM

## 2021-03-22 DIAGNOSIS — R159 Full incontinence of feces: Secondary | ICD-10-CM

## 2021-03-22 DIAGNOSIS — R42 Dizziness and giddiness: Secondary | ICD-10-CM | POA: Diagnosis not present

## 2021-03-22 NOTE — Patient Instructions (Signed)
Access Code: [email or web address] URL: [email or web address] Date: 03/22/2021 Prepared by: Anabel Halon  Exercises Supine Hip Adduction Isometric with Newman Pies - 1 x daily - 7 x weekly - 2 sets - 10 reps Supine Single Knee to Chest Stretch - 2 x daily - 7 x weekly - 1 sets - 2 reps - 15s hold Supine Lower Trunk Rotation - 2 x daily - 7 x weekly - 1 sets - 10 reps - 15s hold Supine Piriformis Stretch with Leg Straight - 2 x daily - 7 x weekly - 1 sets - 2 reps - 15s hold Seated Hamstring Stretch - 2 x daily - 7 x weekly - 1 sets - 2 reps - 15s hold Seated Hip Flexor Stretch - 2 x daily - 7 x weekly - 1 sets - 2 reps - 15s hold

## 2021-03-22 NOTE — Therapy (Signed)
[facility] [address] [place], [place], [address] Phone: [phone]   Fax:  [phone]  Physical Therapy Treatment  Patient Details  Name: Alexis Shelton MRN: [id number] Date of Birth: [birth year] Referring Provider (PT): Dr. Lavada Mesi   Encounter Date: 03/22/2021   PT End of Session - 03/22/21 1248    Visit Number 2    Date for PT Re-Evaluation 05/09/21    Authorization Type Medicare/Aetna    Progress Note Due on Visit 10    PT Start Time 1230    PT Stop Time 1315    PT Time Calculation (min) 45 min    Activity Tolerance Patient tolerated treatment well    Behavior During Therapy [facility] for tasks assessed/performed           Past Medical History:  Diagnosis Date  . Arthritis   . DDD (degenerative disc disease), lumbar 11/24/2018  . Dizziness   . Gallstones   . Hearing loss   . History of stroke involving cerebellum 10/13/2018  . Hypercholesteremia   . Hypertension   . Hypothyroidism   . 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  . Migraines   . Mild intermittent asthma without complication 02/11/2019  . Primary osteoarthritis of both feet 11/24/2018  . Rheumatoid arthritis (HCC) 07/04/2018  . Stage 3 chronic kidney disease (HCC) 02/11/2019  . Vitamin D deficiency 02/11/2019    Past Surgical History:  Procedure Laterality Date  . ABDOMINAL HYSTERECTOMY    . BREAST REDUCTION SURGERY Bilateral   . CATARACT EXTRACTION, BILATERAL    . INCISION AND DRAINAGE BREAST ABSCESS Left   . REDUCTION MAMMAPLASTY    . REPLACEMENT TOTAL KNEE Bilateral   . TONSILLECTOMY AND ADENOIDECTOMY      There were no vitals filed for this visit.   Subjective Assessment - 03/22/21 1233    Subjective Patient reports that she feels about the same as she usually does at this time of date. Feels that muscles are tight    Pertinent History decreased hearing Lt ear after abdominal sx in 1990 - may have been a small  stroke that caused hearing loss; (CVA was in the cerebellum);  RA, HTN, idiopathic gout, h/o CVA involving cerebellum, OA hands & feet, DDD cervical & lumbar, stage 3 CKD, s/p bil. TKR's, mild peripheral neuropathy; history of vertigo; tried aquatic ex but didn't like    How long can you sit comfortably? feels better    How long can you stand comfortably? hard to cook b/c of hands;  I get tired when I'm on my feet a while    How long can you walk comfortably? with shopping cart OK; without cart painful to walk to car    Diagnostic tests none for back    Patient Stated Goals get rid of pain; decreased reliance on medication; start walking program; sleep better; start an ex program--maybe at the gym    Currently in Pain? No/denies    Pain Onset More than a month ago    Pain Frequency Intermittent    Multiple Pain Sites No                             [facility] Adult PT Treatment/Exercise - 03/22/21 0001      Exercises   Exercises Lumbar      Lumbar Exercises: Stretches   Active Hamstring Stretch Right;Left;1 rep;20 seconds    Active Hamstring Stretch Limitations seated  Single Knee to Chest Stretch Right;Left;1 rep;20 seconds    Lower Trunk Rotation Limitations x10 repetitions bil.    Hip Flexor Stretch Right;Left;1 rep;20 seconds    Hip Flexor Stretch Limitations seated at edge of mat table   also attempting in standing with patient unalbe to perform despite maximal cuing   Piriformis Stretch Right;Left;1 rep;20 seconds    Other Lumbar Stretch Exercise seated ball roll into flexion; x10      Lumbar Exercises: Supine   Pelvic Tilt 10 reps    Pelvic Tilt Limitations max verbal cuing for posterior tilt and continues breathing with patient unable to perform; will continue attempts to teach    Other Supine Lumbar Exercises ball squeeze 2x10 repetitions      Manual Therapy   Manual Therapy Soft tissue mobilization    Soft tissue mobilization STM to thoracic paraspinals for  decreased pain                  PT Education - 03/22/21 1321    Education Details Access Code: [email or web address]    Person(s) Educated Patient    Methods Explanation;Demonstration;Tactile cues;Verbal cues;Handout    Comprehension Verbalized understanding;Returned demonstration;Verbal cues required;Tactile cues required            PT Short Term Goals - 03/14/21 2006      PT SHORT TERM GOAL #1   Title independent with initial HEP    Time 4    Period Weeks    Status New    Target Date 04/11/21      PT SHORT TERM GOAL #2   Title The patient will report a 30% improvement in back pain at night, with standing and walking    Time 4    Period Weeks    Status New      PT SHORT TERM GOAL #3   Title The patient will have improved FOTO score to 60%    Time 4    Period Weeks    Status New      PT SHORT TERM GOAL #4   Title Lumbar extension to 15 degrees and hip extension to 10 degrees needed for improved mobility for walking    Time 4    Period Weeks    Status New             PT Long Term Goals - 03/14/21 2011      PT LONG TERM GOAL #1   Title The patient will be independent in a safe self progression of a HEP and/or a gym program    Time 8    Period Weeks    Status New    Target Date 05/09/21      PT LONG TERM GOAL #2   Title The patient will have lumbo/pelvic/hip strength grossly 4+/5 needed for walking/standing for longer periods of time with less discomfort    Time 8    Period Weeks    Status New      PT LONG TERM GOAL #3   Title The patient will report a 60% improvement in back pain at night and with standing/walking    Time 8    Period Weeks    Status New      PT LONG TERM GOAL #4   Title FOTO functional outcome score improved to 68%    Time 8    Period Weeks    Status New                 Plan -  03/22/21 1317    Clinical Impression Statement Patient requiring frequent cuing for pacing of movement as patient tends to move with increasd pace and  is intermittently impulsive with regards to safety. Requiring max verbal and tactile cuing to initiate pelvic tilt with patient unable to reproduce movement. Will continue attempts in future sessions. Requiring max cuing for hip flexor stretch in standing but better able to perform seated. Noting decreased mobility of thoracic soft tissue this date. Patient reporting decreased tightness following manual techniques. HEP added today. Would benefit from continued skilled intervention to address impairments for decreased pain and improved sleep hygiene.    Personal Factors and Comorbidities Fitness;Age;Time since onset of injury/illness/exacerbation;Comorbidity 2    Comorbidities abdominal hysterectomy with hearing loss Lt ear after surgery, RA; h/o CVA involving cerebellum, HTN, idiopathic gout, OA hands and feet, DDD cervical and lumbar, stage 3 CKD, s/p bil. TKR's, peripheral neuropathy    Examination-Activity Limitations Hygiene/Grooming;Locomotion Level;Transfers;Bed Mobility;Bend;Stand    Examination-Participation Restrictions Community Activity;Cleaning;Meal Prep;Shop;Laundry    Rehab Potential Good    PT Frequency 2x / week    PT Duration 8 weeks    PT Treatment/Interventions ADLs/Self Care Home Management;Aquatic Therapy;Cryotherapy;Electrical Stimulation;Moist Heat;Iontophoresis 4mg /ml Dexamethasone;Traction;Neuromuscular re-education;Therapeutic exercise;Therapeutic activities;Patient/family education;Manual techniques;Dry needling;Taping    PT Next Visit Plan introduce DN; review HEP; begin gentle core and LE strengthening    PT Home Exercise Plan Access Code: [email or web address]    Consulted and Agree with Plan of Care Patient           Patient will benefit from skilled therapeutic intervention in order to improve the following deficits and impairments:  Dizziness,Difficulty walking,Decreased balance,Impaired sensation  Visit Diagnosis: Acute bilateral low back pain, unspecified whether sciatica  present  Muscle weakness (generalized)  Difficulty in walking, not elsewhere classified  Dizziness and giddiness  Other abnormalities of gait and mobility  Unsteadiness on feet  Other lack of coordination  Incontinence of feces, unspecified fecal incontinence type  Other urinary incontinence     Problem List Patient Active Problem List   Diagnosis Date Noted  . Obesity 08/19/2020  . Proteinuria 08/19/2020  . Vitamin D deficiency 02/11/2019  . Stage 3 chronic kidney disease (HCC) 02/11/2019  . Mild intermittent asthma without complication 02/11/2019  . Primary osteoarthritis of both hands 11/24/2018  . Primary osteoarthritis of both feet 11/24/2018  . DDD (degenerative disc disease), cervical 11/24/2018  . DDD (degenerative disc disease), lumbar 11/24/2018  . History of stroke involving cerebellum 10/13/2018  . Gait abnormality 10/13/2018  . 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  . Asthma 08/12/2018  . Hypothyroidism 08/12/2018  . Rheumatoid arthritis (HCC) 07/04/2018  . Essential hypertension 07/04/2018  . Hyperlipidemia 07/04/2018  . Hyperglycemia 05/28/2017  . Seasonal allergic rhinitis 05/28/2017  . Gouty arthropathy 09/15/2010  . Osteoarthrosis, generalized, involving multiple sites 09/15/2010  . Esophagitis 08/30/2010   Anabel Halon PT, DPT  03/22/21 1:24 PM  [facility] [address] [place], [place], [address] Phone: [phone]   Fax:  [phone]  Name: Alexis Shelton MRN: [id number] Date of Birth: 1941-10-29

## 2021-03-28 ENCOUNTER — Ambulatory Visit: Payer: Medicare Other | Attending: Family Medicine | Admitting: Physical Therapy

## 2021-03-28 ENCOUNTER — Other Ambulatory Visit: Payer: Self-pay

## 2021-03-28 ENCOUNTER — Encounter: Payer: Self-pay | Admitting: Physical Therapy

## 2021-03-28 DIAGNOSIS — R2689 Other abnormalities of gait and mobility: Secondary | ICD-10-CM | POA: Diagnosis not present

## 2021-03-28 DIAGNOSIS — R262 Difficulty in walking, not elsewhere classified: Secondary | ICD-10-CM | POA: Insufficient documentation

## 2021-03-28 DIAGNOSIS — R2681 Unsteadiness on feet: Secondary | ICD-10-CM | POA: Diagnosis not present

## 2021-03-28 DIAGNOSIS — M6281 Muscle weakness (generalized): Secondary | ICD-10-CM | POA: Diagnosis not present

## 2021-03-28 DIAGNOSIS — R42 Dizziness and giddiness: Secondary | ICD-10-CM | POA: Diagnosis not present

## 2021-03-28 DIAGNOSIS — M545 Low back pain, unspecified: Secondary | ICD-10-CM | POA: Insufficient documentation

## 2021-03-28 NOTE — Therapy (Signed)
[facility] [address] [place], [place], [address] Phone: [phone]   Fax:  [phone]  Physical Therapy Treatment  Patient Details  Name: Alexis Shelton MRN: [id number] Date of Birth: [birth year] Referring Provider (PT): Dr. Lavada Mesi   Encounter Date: 03/28/2021   PT End of Session - 03/28/21 1317    Visit Number 3    Date for PT Re-Evaluation 05/09/21    Authorization Type Medicare/[employer]et[employer]    Progress Note Due on Visit 10    PT Start Time 1232    PT Stop Time 1312    PT Time Calculation (min) 40 min           Past Medical History:  Diagnosis Date  . Arthritis   . DDD (degenerative disc disease), lumbar 11/24/2018  . Dizziness   . Gallstones   . Hearing loss   . History of stroke involving cerebellum 10/13/2018  . Hypercholesteremia   . Hypertension   . Hypothyroidism   . 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  . Migraines   . Mild intermittent asthma without complication 02/11/2019  . Primary osteoarthritis of both feet 11/24/2018  . Rheumatoid arthritis (HCC) 07/04/2018  . Stage 3 chronic kidney disease (HCC) 02/11/2019  . Vitamin D deficiency 02/11/2019    Past Surgical History:  Procedure Laterality Date  . ABDOMINAL HYSTERECTOMY    . BREAST REDUCTION SURGERY Bilateral   . CATARACT EXTRACTION, BILATERAL    . INCISION AND DRAINAGE BREAST ABSCESS Left   . REDUCTION MAMMAPLASTY    . REPLACEMENT TOTAL KNEE Bilateral   . TONSILLECTOMY AND ADENOIDECTOMY      There were no vitals filed for this visit.   Subjective Assessment - 03/28/21 1235    Subjective Had Rt shoulder pain early this morning. Took an analgesic which helped. States this is a new occurence.    Pertinent History decreased hearing Lt ear after abdominal sx in 1990 - may have been a small stroke that caused hearing loss; (CVA was in the cerebellum);  RA, HTN, idiopathic gout, h/o CVA involving cerebellum, OA hands & feet,  DDD cervical & lumbar, stage 3 CKD, s/p bil. TKR's, mild peripheral neuropathy; history of vertigo; tried aquatic ex but didn't like    How long can you sit comfortably? feels better    How long can you stand comfortably? hard to cook b/c of hands;  I get tired when I'm on my feet a while    How long can you walk comfortably? with shopping cart OK; without cart painful to walk to car    Diagnostic tests none for back    Patient Stated Goals get rid of pain; decreased reliance on medication; start walking program; sleep better; start an ex program--maybe at the gym    Currently in Pain? No/denies    Pain Onset More than a month ago    Pain Frequency Intermittent    Multiple Pain Sites No                             [facility] Adult PT Treatment/Exercise - 03/28/21 0001      Lumbar Exercises: Stretches   Active Hamstring Stretch Right;Left;1 rep;20 seconds    Active Hamstring Stretch Limitations seated    Single Knee to Chest Stretch Right;Left;1 rep;20 seconds    Lower Trunk Rotation 2 reps;10 seconds    Lower Trunk Rotation Limitations bil    Hip Flexor  Stretch Right;Left;1 rep;20 seconds    Hip Flexor Stretch Limitations seated at edge of mat table    Piriformis Stretch Right;Left;1 rep;20 seconds    Other Lumbar Stretch Exercise seated ball roll into flexion; x10      Lumbar Exercises: Aerobic   Nustep lvl 1 x 6 minutes      Lumbar Exercises: Standing   Shoulder Extension Both    Theraband Level (Shoulder Extension) Level 2 (Red)    Shoulder Extension Limitations 2x10 repetitions      Lumbar Exercises: Seated   Other Seated Lumbar Exercises ball squeeze; 2x10 repetitions      Lumbar Exercises: Supine   Pelvic Tilt 10 reps;5 seconds    Pelvic Tilt Limitations verbal cuing for continued breathing      Lumbar Exercises: Sidelying   Clam Both    Clam Limitations 2x10 repetitions; green loop      Knee/Hip Exercises: Standing   Hip Abduction Both;2 sets;10 reps     Abduction Limitations hand hold x2                    PT Short Term Goals - 03/14/21 2006      PT SHORT TERM GOAL #1   Title independent with initial HEP    Time 4    Period Weeks    Status New    Target Date 04/11/21      PT SHORT TERM GOAL #2   Title The patient will report a 30% improvement in back pain at night, with standing and walking    Time 4    Period Weeks    Status New      PT SHORT TERM GOAL #3   Title The patient will have improved FOTO score to 60%    Time 4    Period Weeks    Status New      PT SHORT TERM GOAL #4   Title Lumbar extension to 15 degrees and hip extension to 10 degrees needed for improved mobility for walking    Time 4    Period Weeks    Status New             PT Long Term Goals - 03/14/21 2011      PT LONG TERM GOAL #1   Title The patient will be independent in a safe self progression of a HEP and/or a gym program    Time 8    Period Weeks    Status New    Target Date 05/09/21      PT LONG TERM GOAL #2   Title The patient will have lumbo/pelvic/hip strength grossly 4+/5 needed for walking/standing for longer periods of time with less discomfort    Time 8    Period Weeks    Status New      PT LONG TERM GOAL #3   Title The patient will report a 60% improvement in back pain at night and with standing/walking    Time 8    Period Weeks    Status New      PT LONG TERM GOAL #4   Title FOTO functional outcome score improved to 68%    Time 8    Period Weeks    Status New                 Plan - 03/28/21 1314    Clinical Impression Statement Patient reporting increased Rt shoulder pain this weekend. Educating patient to using pillow under Rt  UE to prevent sleeping directly on shoulder. Patient verbalizing understanding. Requiring verbal and tactile cues for pelvic tilt exercise as patient tends to use valsalva manuever indicating strength impairments of abdominal musculature. Reviewing hip flexor stretch for  home with patient able to return demonstration. Would benefit from continued skilled intervention to address impairments for improved functional activity tolerance.    Personal Factors and Comorbidities Fitness;Age;Time since onset of injury/illness/exacerbation;Comorbidity 2    Comorbidities abdominal hysterectomy with hearing loss Lt ear after surgery, RA; h/o CVA involving cerebellum, HTN, idiopathic gout, OA hands and feet, DDD cervical and lumbar, stage 3 CKD, s/p bil. TKR's, peripheral neuropathy    Examination-Activity Limitations Hygiene/Grooming;Locomotion Level;Transfers;Bed Mobility;Bend;Stand    Examination-Participation Restrictions Community Activity;Cleaning;Meal Prep;Shop;Laundry    Rehab Potential Good    PT Frequency 2x / week    PT Duration 8 weeks    PT Treatment/Interventions ADLs/Self Care Home Management;Aquatic Therapy;Cryotherapy;Electrical Stimulation;Moist Heat;Iontophoresis 4mg /ml Dexamethasone;Traction;Neuromuscular re-education;Therapeutic exercise;Therapeutic activities;Patient/family education;Manual techniques;Dry needling;Taping    PT Next Visit Plan introduce DN if necessary; continue gentle core and LE strengthening to patient tolerance    PT Home Exercise Plan Access Code: [email or web address]    Consulted and Agree with Plan of Care Patient           Patient will benefit from skilled therapeutic intervention in order to improve the following deficits and impairments:  Dizziness,Difficulty walking,Decreased balance,Impaired sensation  Visit Diagnosis: Acute bilateral low back pain, unspecified whether sciatica present  Muscle weakness (generalized)  Difficulty in walking, not elsewhere classified  Dizziness and giddiness  Other abnormalities of gait and mobility  Unsteadiness on feet     Problem List Patient Active Problem List   Diagnosis Date Noted  . Obesity 08/19/2020  . Proteinuria 08/19/2020  . Vitamin D deficiency 02/11/2019  . Stage 3 chronic  kidney disease (HCC) 02/11/2019  . Mild intermittent asthma without complication 02/11/2019  . Primary osteoarthritis of both hands 11/24/2018  . Primary osteoarthritis of both feet 11/24/2018  . DDD (degenerative disc disease), cervical 11/24/2018  . DDD (degenerative disc disease), lumbar 11/24/2018  . History of stroke involving cerebellum 10/13/2018  . Gait abnormality 10/13/2018  . 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  . Asthma 08/12/2018  . Hypothyroidism 08/12/2018  . Rheumatoid arthritis (HCC) 07/04/2018  . Essential hypertension 07/04/2018  . Hyperlipidemia 07/04/2018  . Hyperglycemia 05/28/2017  . Seasonal allergic rhinitis 05/28/2017  . Gouty arthropathy 09/15/2010  . Osteoarthrosis, generalized, involving multiple sites 09/15/2010  . Esophagitis 08/30/2010   Anabel Halon PT, DPT  03/28/21 1:18 PM  [facility] [address] [place], [place], [address] Phone: [phone]   Fax:  [phone]  Name: Myrtha Holderby MRN: [id number] Date of Birth: 1941/01/22

## 2021-03-30 ENCOUNTER — Other Ambulatory Visit: Payer: Self-pay

## 2021-03-30 ENCOUNTER — Ambulatory Visit: Payer: Medicare Other | Admitting: Physical Therapy

## 2021-03-30 DIAGNOSIS — R2681 Unsteadiness on feet: Secondary | ICD-10-CM | POA: Diagnosis not present

## 2021-03-30 DIAGNOSIS — M6281 Muscle weakness (generalized): Secondary | ICD-10-CM

## 2021-03-30 DIAGNOSIS — R262 Difficulty in walking, not elsewhere classified: Secondary | ICD-10-CM

## 2021-03-30 DIAGNOSIS — M545 Low back pain, unspecified: Secondary | ICD-10-CM | POA: Diagnosis not present

## 2021-03-30 DIAGNOSIS — R2689 Other abnormalities of gait and mobility: Secondary | ICD-10-CM | POA: Diagnosis not present

## 2021-03-30 DIAGNOSIS — R42 Dizziness and giddiness: Secondary | ICD-10-CM | POA: Diagnosis not present

## 2021-03-30 NOTE — Therapy (Signed)
[facility] [address] [place], [place], [address] Phone: [phone]   Fax:  [phone]  Physical Therapy Treatment  Patient Details  Name: Alexis Shelton MRN: [id number] Date of Birth: [birth year] Referring Provider (PT): Dr. Lavada Mesi   Encounter Date: 03/30/2021   PT End of Session - 03/30/21 1701    Visit Number 4    Date for PT Re-Evaluation 05/09/21    Authorization Type Medicare/Aetna    Progress Note Due on Visit 10    PT Start Time 1530    PT Stop Time 1615    PT Time Calculation (min) 45 min           Past Medical History:  Diagnosis Date  . Arthritis   . DDD (degenerative disc disease), lumbar 11/24/2018  . Dizziness   . Gallstones   . Hearing loss   . History of stroke involving cerebellum 10/13/2018  . Hypercholesteremia   . Hypertension   . Hypothyroidism   . 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  . Migraines   . Mild intermittent asthma without complication 02/11/2019  . Primary osteoarthritis of both feet 11/24/2018  . Rheumatoid arthritis (HCC) 07/04/2018  . Stage 3 chronic kidney disease (HCC) 02/11/2019  . Vitamin D deficiency 02/11/2019    Past Surgical History:  Procedure Laterality Date  . ABDOMINAL HYSTERECTOMY    . BREAST REDUCTION SURGERY Bilateral   . CATARACT EXTRACTION, BILATERAL    . INCISION AND DRAINAGE BREAST ABSCESS Left   . REDUCTION MAMMAPLASTY    . REPLACEMENT TOTAL KNEE Bilateral   . TONSILLECTOMY AND ADENOIDECTOMY      There were no vitals filed for this visit.   Subjective Assessment - 03/30/21 1531    Subjective I'm going through a rheumatoid flare up.  I have to sit on the edge of the bed a while before getting up.  I can't pick up my cat in the mornings but I can later in the day.  I have a lot going on including taking care of my son who is Bipolar.  He needs a lot of help.    Pertinent History decreased hearing Lt ear after abdominal sx in  1990 - may have been a small stroke that caused hearing loss; (CVA was in the cerebellum);  RA, HTN, idiopathic gout, h/o CVA involving cerebellum, OA hands & feet, DDD cervical & lumbar, stage 3 CKD, s/p bil. TKR's, mild peripheral neuropathy; history of vertigo; tried aquatic ex but didn't like    Patient Stated Goals get rid of pain; decreased reliance on medication; start walking program; sleep better; start an ex program--maybe at the gym    Currently in Pain? No/denies    Pain Score 0-No pain    Pain Location Back    Pain Type Acute pain                             [facility] Adult PT Treatment/Exercise - 03/30/21 0001      Self-Care   Self-Care Other Self-Care Comments    Other Self-Care Comments  discussed community based options for ex including [employer] which has a pool and gym equipment      Lumbar Exercises: Aerobic   Nustep lvl 1 x 6 minutes      Lumbar Exercises: Standing   Other Standing Lumbar Exercises heel raises 10x    Other Standing Lumbar Exercises counter push ups 10x  Lumbar Exercises: Seated   Other Seated Lumbar Exercises foam roll push downs 10x    Other Seated Lumbar Exercises red band rows 15x and bil shoulder extensions 10x      Lumbar Exercises: Supine   Ab Set 10 reps    Clam 10 reps    Clam Limitations red loop    Heel Slides 10 reps    Heel Slides Limitations with ab brace    Bridge 10 reps      Knee/Hip Exercises: Standing   Hip Abduction AROM;Left;Both;1 set;10 reps    Hip Extension AROM;Right;Left;10 reps    Other Standing Knee Exercises side stepping 5 laps    Other Standing Knee Exercises sit to stand 2x 5 reps                    PT Short Term Goals - 03/30/21 1612      PT SHORT TERM GOAL #1   Title independent with initial HEP    Status Achieved      PT SHORT TERM GOAL #2   Title The patient will report a 30% improvement in back pain at night, with standing and walking    Status Achieved       PT SHORT TERM GOAL #3   Title The patient will have improved FOTO score to 60%    Time 4    Status On-going      PT SHORT TERM GOAL #4   Time 4    Period Weeks    Status On-going             PT Long Term Goals - 03/14/21 2011      PT LONG TERM GOAL #1   Title The patient will be independent in a safe self progression of a HEP and/or a gym program    Time 8    Period Weeks    Status New    Target Date 05/09/21      PT LONG TERM GOAL #2   Title The patient will have lumbo/pelvic/hip strength grossly 4+/5 needed for walking/standing for longer periods of time with less discomfort    Time 8    Period Weeks    Status New      PT LONG TERM GOAL #3   Title The patient will report a 60% improvement in back pain at night and with standing/walking    Time 8    Period Weeks    Status New      PT LONG TERM GOAL #4   Title FOTO functional outcome score improved to 68%    Time 8    Period Weeks    Status New                 Plan - 03/30/21 1703    Clinical Impression Statement The patient reports morning pain which she attributes to a RA flare up.  She reports the pain improves as the day goes on.  She is able to perform low to moderate level mobility and strengthening ex's this afternoon and reports all "felt good" and "feeling better" at the end of treatment session.  She is progressing with STGs.  Therapist monitoring response throughout treatment session.    Personal Factors and Comorbidities Fitness;Age;Time since onset of injury/illness/exacerbation;Comorbidity 2    Comorbidities abdominal hysterectomy with hearing loss Lt ear after surgery, RA; h/o CVA involving cerebellum, HTN, idiopathic gout, OA hands and feet, DDD cervical and lumbar, stage 3 CKD, s/p bil. TKR's,  peripheral neuropathy    Examination-Participation Restrictions Community Activity;Cleaning;Meal Prep;Shop;Laundry    Rehab Potential Good    PT Frequency 2x / week    PT Duration 8 weeks    PT  Treatment/Interventions ADLs/Self Care Home Management;Aquatic Therapy;Cryotherapy;Electrical Stimulation;Moist Heat;Iontophoresis 4mg /ml Dexamethasone;Traction;Neuromuscular re-education;Therapeutic exercise;Therapeutic activities;Patient/family education;Manual techniques;Dry needling;Taping    PT Next Visit Plan check lumbar ROM and STGs; core and LE strengthening as tolerated during RA flare up; encourage plan for continued ex after discharge    PT Home Exercise Plan Access Code: [email or web address]           Patient will benefit from skilled therapeutic intervention in order to improve the following deficits and impairments:  Dizziness,Difficulty walking,Decreased balance,Impaired sensation  Visit Diagnosis: Acute bilateral low back pain, unspecified whether sciatica present  Muscle weakness (generalized)  Difficulty in walking, not elsewhere classified     Problem List Patient Active Problem List   Diagnosis Date Noted  . Obesity 08/19/2020  . Proteinuria 08/19/2020  . Vitamin D deficiency 02/11/2019  . Stage 3 chronic kidney disease (HCC) 02/11/2019  . Mild intermittent asthma without complication 02/11/2019  . Primary osteoarthritis of both hands 11/24/2018  . Primary osteoarthritis of both feet 11/24/2018  . DDD (degenerative disc disease), cervical 11/24/2018  . DDD (degenerative disc disease), lumbar 11/24/2018  . History of stroke involving cerebellum 10/13/2018  . Gait abnormality 10/13/2018  . 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  . Asthma 08/12/2018  . Hypothyroidism 08/12/2018  . Rheumatoid arthritis (HCC) 07/04/2018  . Essential hypertension 07/04/2018  . Hyperlipidemia 07/04/2018  . Hyperglycemia 05/28/2017  . Seasonal allergic rhinitis 05/28/2017  . Gouty arthropathy 09/15/2010  . Osteoarthrosis, generalized, involving multiple sites 09/15/2010  . Esophagitis 08/30/2010   Lavinia Sharps, PT 03/30/21 5:09 PM Phone: [phone] Fax:  [phone] Vivien Presto 03/30/2021, 5:09 PM  [facility] [address] [place], [place], [address] Phone: [phone]   Fax:  [phone]  Name: Alexis Shelton MRN: [id number] Date of Birth: [birth year]

## 2021-04-04 ENCOUNTER — Encounter: Payer: Medicare Other | Admitting: Physical Therapy

## 2021-04-06 ENCOUNTER — Encounter: Payer: Medicare Other | Admitting: Physical Therapy

## 2021-04-11 ENCOUNTER — Ambulatory Visit: Payer: Medicare Other | Admitting: Physical Therapy

## 2021-04-11 ENCOUNTER — Other Ambulatory Visit: Payer: Self-pay

## 2021-04-11 ENCOUNTER — Encounter: Payer: Self-pay | Admitting: Physical Therapy

## 2021-04-11 DIAGNOSIS — R262 Difficulty in walking, not elsewhere classified: Secondary | ICD-10-CM

## 2021-04-11 DIAGNOSIS — M6281 Muscle weakness (generalized): Secondary | ICD-10-CM | POA: Diagnosis not present

## 2021-04-11 DIAGNOSIS — R2689 Other abnormalities of gait and mobility: Secondary | ICD-10-CM | POA: Diagnosis not present

## 2021-04-11 DIAGNOSIS — R2681 Unsteadiness on feet: Secondary | ICD-10-CM

## 2021-04-11 DIAGNOSIS — M545 Low back pain, unspecified: Secondary | ICD-10-CM

## 2021-04-11 DIAGNOSIS — R42 Dizziness and giddiness: Secondary | ICD-10-CM | POA: Diagnosis not present

## 2021-04-11 NOTE — Patient Instructions (Signed)
Bridge - 1 x daily - 7 x weekly - 2 sets - 10 reps

## 2021-04-11 NOTE — Therapy (Signed)
[facility] [address], [place], [place], [address] Phone: [phone]   Fax:  [phone]  Physical Therapy Treatment  Patient Details  Name: Alexis Shelton MRN: [id number] Date of Birth: Sep 05, 1941 Referring Provider (PT): Dr. Eunice Blase   Encounter Date: 04/11/2021   PT End of Session - 04/11/21 1615    Visit Number 5    Date for PT Re-Evaluation 05/09/21    Authorization Type Medicare/Aetna    Progress Note Due on Visit 10    PT Start Time 3875    PT Stop Time 1609    PT Time Calculation (min) 38 min    Activity Tolerance Patient tolerated treatment well    Behavior During Therapy [facility] for tasks assessed/performed           Past Medical History:  Diagnosis Date  . Arthritis   . DDD (degenerative disc disease), lumbar 11/24/2018  . Dizziness   . Gallstones   . Hearing loss   . History of stroke involving cerebellum 10/13/2018  . Hypercholesteremia   . Hypertension   . Hypothyroidism   . 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  . Migraines   . Mild intermittent asthma without complication 6/43/3295  . Primary osteoarthritis of both feet 11/24/2018  . Rheumatoid arthritis ([place]) 07/04/2018  . Stage 3 chronic kidney disease ([place]) 02/11/2019  . Vitamin D deficiency 02/11/2019    Past Surgical History:  Procedure Laterality Date  . ABDOMINAL HYSTERECTOMY    . BREAST REDUCTION SURGERY Bilateral   . CATARACT EXTRACTION, BILATERAL    . INCISION AND DRAINAGE BREAST ABSCESS Left   . REDUCTION MAMMAPLASTY    . REPLACEMENT TOTAL KNEE Bilateral   . TONSILLECTOMY AND ADENOIDECTOMY      There were no vitals filed for this visit.   Subjective Assessment - 04/11/21 1535    Pain Score 2     Pain Location Back    Pain Orientation Right;Left    Pain Type Acute pain              [facility] PT Assessment - 04/11/21 0001      Observation/Other Assessments   Focus on Therapeutic Outcomes (FOTO)  42       AROM   Right Hip Extension 14    Left Hip Extension 10    Lumbar Extension 10                         [facility] Adult PT Treatment/Exercise - 04/11/21 0001      Lumbar Exercises: Standing   Row Both    Theraband Level (Row) Level 3 (Green)    Row Limitations 2 x 10 repetitions    Shoulder Extension Both;Theraband    Theraband Level (Shoulder Extension) Level 2 (Red)    Shoulder Extension Limitations x12 repetitions    Other Standing Lumbar Exercises heel raises 12x    Other Standing Lumbar Exercises counter push ups 10x      Lumbar Exercises: Seated   Other Seated Lumbar Exercises foam roll push downs 10x      Lumbar Exercises: Supine   Ab Set 15 reps                  PT Education - 04/11/21 1610    Education Details Added bridge to HEP    Person(s) Educated Patient    Methods Explanation;Demonstration;Tactile cues;Verbal cues;Handout    Comprehension Verbalized understanding;Returned demonstration;Verbal cues required;Tactile cues required;Need further  instruction            PT Short Term Goals - 04/11/21 1600      PT SHORT TERM GOAL #1   Title independent with initial HEP    Time 4    Period Weeks    Status Achieved    Target Date 04/11/21      PT SHORT TERM GOAL #2   Title The patient will report a 30% improvement in back pain at night, with standing and walking    Time 4    Period Weeks    Status Achieved    Target Date 04/26/20      PT SHORT TERM GOAL #3   Title The patient will have improved FOTO score to 60%    Baseline 56%    Time 4    Period Weeks    Status On-going   41   Target Date 04/26/20      PT SHORT TERM GOAL #4   Title Lumbar extension to 15 degrees and hip extension to 10 degrees needed for improved mobility for walking    Time 4    Period Weeks    Status Partially Met   Lt 10 degrees; Rt 14 degrees; lumbar extension 10 degrees   Target Date 04/26/20             PT Long Term Goals - 03/14/21 2011       PT LONG TERM GOAL #1   Title The patient will be independent in a safe self progression of a HEP and/or a gym program    Time 8    Period Weeks    Status New    Target Date 05/09/21      PT LONG TERM GOAL #2   Title The patient will have lumbo/pelvic/hip strength grossly 4+/5 needed for walking/standing for longer periods of time with less discomfort    Time 8    Period Weeks    Status New      PT LONG TERM GOAL #3   Title The patient will report a 60% improvement in back pain at night and with standing/walking    Time 8    Period Weeks    Status New      PT LONG TERM GOAL #4   Title FOTO functional outcome score improved to 68%    Time 8    Period Weeks    Status New                 Plan - 04/11/21 1555    Clinical Impression Statement Patient reporting improved lumbar pain this date. Tolerating increased repetitions and resistance of select exercises this date without increased pain. Noting poor UE proprioceptive awareness as patient continues to require cuing for proper form during rowing exercise. Patient Lt hip extension improving to 10 degrees and Rt hip extension improving to 14 degrees. Noting no improvement in lumbar extension however measurements difficult due to patient's balance impairments. Would benefit from continued skilled intervention to address impairments for improved functional mobility and decreased pain.    Personal Factors and Comorbidities Fitness;Age;Time since onset of injury/illness/exacerbation;Comorbidity 2    Comorbidities abdominal hysterectomy with hearing loss Lt ear after surgery, RA; h/o CVA involving cerebellum, HTN, idiopathic gout, OA hands and feet, DDD cervical and lumbar, stage 3 CKD, s/p bil. TKR's, peripheral neuropathy    Examination-Activity Limitations Hygiene/Grooming;Locomotion Level;Transfers;Bed Mobility;Bend;Stand    Examination-Participation Restrictions Community Activity;Cleaning;Meal Prep;Shop;Laundry    Rehab  Potential Good  PT Frequency 2x / week    PT Duration 8 weeks    PT Treatment/Interventions ADLs/Self Care Home Management;Aquatic Therapy;Cryotherapy;Electrical Stimulation;Moist Heat;Iontophoresis 72m/ml Dexamethasone;Traction;Neuromuscular re-education;Therapeutic exercise;Therapeutic activities;Patient/family education;Manual techniques;Dry needling;Taping;Functional mobility training    PT Next Visit Plan focus on lumbar mobility; progress core and LE strengthening to patient tolerance    PT Home Exercise Plan Access Code: [email or web address]   Consulted and Agree with Plan of Care Patient           Patient will benefit from skilled therapeutic intervention in order to improve the following deficits and impairments:  Dizziness,Difficulty walking,Decreased balance,Impaired sensation  Visit Diagnosis: Acute bilateral low back pain, unspecified whether sciatica present  Muscle weakness (generalized)  Difficulty in walking, not elsewhere classified  Unsteadiness on feet     Problem List Patient Active Problem List   Diagnosis Date Noted  . Obesity 08/19/2020  . Proteinuria 08/19/2020  . Vitamin D deficiency 02/11/2019  . [place] 02/11/2019  . Mild intermittent asthma without complication 077/02/4034 . Primary osteoarthritis of both hands 11/24/2018  . Primary osteoarthritis of both feet 11/24/2018  . DDD (degenerative disc disease), cervical 11/24/2018  . DDD (degenerative disc disease), lumbar 11/24/2018  . History of stroke involving cerebellum 10/13/2018  . Gait abnormality 10/13/2018  . 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  . Asthma 08/12/2018  . Hypothyroidism 08/12/2018  . [place]oid [place] 07/04/2018  . Essential hypertension 07/04/2018  . Hyperlipidemia 07/04/2018  . Hyperglycemia 05/28/2017  . Seasonal allergic rhinitis 05/28/2017  . Gouty arthropathy 09/15/2010  . Osteoarthrosis, generalized, involving multiple sites  09/15/2010  . Esophagitis 08/30/2010   KEverardo AllPT, DPT  04/11/21 4:16 PM   [facility] [address] [place] [address]Phone: [phone]  Fax:  [phone] Name: Alexis LandingMRN: [id number] of Birth: [birth year]

## 2021-04-13 ENCOUNTER — Encounter: Payer: Medicare Other | Admitting: Physical Therapy

## 2021-04-17 ENCOUNTER — Other Ambulatory Visit: Payer: Self-pay

## 2021-04-17 ENCOUNTER — Ambulatory Visit: Payer: Medicare Other | Admitting: Physical Therapy

## 2021-04-17 ENCOUNTER — Encounter: Payer: Self-pay | Admitting: Physical Therapy

## 2021-04-17 DIAGNOSIS — R262 Difficulty in walking, not elsewhere classified: Secondary | ICD-10-CM

## 2021-04-17 DIAGNOSIS — M6281 Muscle weakness (generalized): Secondary | ICD-10-CM

## 2021-04-17 DIAGNOSIS — R2689 Other abnormalities of gait and mobility: Secondary | ICD-10-CM | POA: Diagnosis not present

## 2021-04-17 DIAGNOSIS — M545 Low back pain, unspecified: Secondary | ICD-10-CM

## 2021-04-17 DIAGNOSIS — R2681 Unsteadiness on feet: Secondary | ICD-10-CM | POA: Diagnosis not present

## 2021-04-17 DIAGNOSIS — R42 Dizziness and giddiness: Secondary | ICD-10-CM | POA: Diagnosis not present

## 2021-04-17 NOTE — Therapy (Signed)
[facility] [address], [address] [place], [place], [address] Phone: [phone]   Fax:  [phone]  Physical Therapy Treatment  Patient Details  Name: Alexis Shelton MRN: [id number] Date of Birth: 1941/09/25 Referring Provider (PT): Dr. Lavada Mesi   Encounter Date: 04/17/2021   PT End of Session - 04/17/21 1532    Visit Number 6    Date for PT Re-Evaluation 05/09/21    Authorization Type Medicare/Aetna    Progress Note Due on Visit 10    PT Start Time 1446    PT Stop Time 1524    PT Time Calculation (min) 38 min    Activity Tolerance Patient tolerated treatment well    Behavior During Therapy [facility] for tasks assessed/performed           Past Medical History:  Diagnosis Date  . Arthritis   . DDD (degenerative disc disease), lumbar 11/24/2018  . Dizziness   . Gallstones   . Hearing loss   . History of stroke involving cerebellum 10/13/2018  . Hypercholesteremia   . Hypertension   . Hypothyroidism   . 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  . Migraines   . Mild intermittent asthma without complication 02/11/2019  . Primary osteoarthritis of both feet 11/24/2018  . Rheumatoid arthritis (HCC) 07/04/2018  . Stage 3 chronic kidney disease (HCC) 02/11/2019  . Vitamin D deficiency 02/11/2019    Past Surgical History:  Procedure Laterality Date  . ABDOMINAL HYSTERECTOMY    . BREAST REDUCTION SURGERY Bilateral   . CATARACT EXTRACTION, BILATERAL    . INCISION AND DRAINAGE BREAST ABSCESS Left   . REDUCTION MAMMAPLASTY    . REPLACEMENT TOTAL KNEE Bilateral   . TONSILLECTOMY AND ADENOIDECTOMY      There were no vitals filed for this visit.   Subjective Assessment - 04/17/21 1528    Subjective "I feel pretty good. I had some back pain this morning because I did some stuff around the house but right now I feel fine."    Pertinent History decreased hearing Lt ear after abdominal sx in 1990 - may have been a  small stroke that caused hearing loss; (CVA was in the cerebellum);  RA, HTN, idiopathic gout, h/o CVA involving cerebellum, OA hands & feet, DDD cervical & lumbar, stage 3 CKD, s/p bil. TKR's, mild peripheral neuropathy; history of vertigo; tried aquatic ex but didn't like    How long can you sit comfortably? feels better    How long can you stand comfortably? hard to cook b/c of hands;  I get tired when I'm on my feet a while    How long can you walk comfortably? with shopping cart OK; without cart painful to walk to car    Diagnostic tests none for back    Patient Stated Goals get rid of pain; decreased reliance on medication; start walking program; sleep better; start an ex program--maybe at the gym    Currently in Pain? 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# x1 bil UE; across carpet x2 bil UE with 7#      Lumbar Exercises: Seated   Other Seated Lumbar Exercises foam roll push downs 2 x 10 reps      Lumbar Exercises: Supine   Ab Set 15 reps    Bridge Limitations 2 x 10 reps                    PT Short Term Goals - 04/17/21 1531      PT SHORT TERM GOAL #1   Title independent with initial HEP    Time 4    Period Weeks    Status Achieved    Target Date 04/11/21      PT SHORT TERM GOAL #2   Title The patient will report a 30% improvement in back pain at night, with standing and walking      PT SHORT TERM GOAL #3   Title The patient will have improved FOTO score to 60%    Baseline 56%    Time 4    Period Weeks    Status On-going    Target Date  04/26/20      PT SHORT TERM GOAL #4   Title Lumbar extension to 15 degrees and hip extension to 10 degrees needed for improved mobility for walking             PT Long Term Goals - 03/14/21 2011      PT LONG TERM GOAL #1   Title The patient will be independent in a safe self progression of a HEP and/or a gym program    Time 8    Period Weeks    Status New    Target Date 05/09/21      PT LONG TERM GOAL #2   Title The patient will have lumbo/pelvic/hip strength grossly 4+/5 needed for walking/standing for longer periods of time with less discomfort    Time 8    Period Weeks    Status New      PT LONG TERM GOAL #3   Title The patient will report a 60% improvement in back pain at night and with standing/walking    Time 8    Period Weeks    Status New      PT LONG TERM GOAL #4   Title FOTO functional outcome score improved to 68%    Time 8    Period Weeks    Status New                 Plan - 04/17/21 1528    Clinical Impression Statement Patient requiring supervision assist when performing farmer carry activity due to increased scissoring gait pattern and trunk sway. Gait pattern improved when patient cued for decreased gait speed. Demonstrating good ab set without use of valsalva manuever this date. Requiring moderate verbal and tactile cuing for decreased scapular elevation when performing total gym rows. Would benefit from continued skilled intervention to improved activity tolerance and decrease pain.    Personal Factors and Comorbidities Fitness;Age;Time since onset of injury/illness/exacerbation;Comorbidity 2    Comorbidities abdominal hysterectomy with hearing loss Lt ear after surgery, RA; h/o CVA involving cerebellum, HTN, idiopathic gout, OA hands and feet, DDD cervical and lumbar, stage 3 CKD, s/p bil. TKR's, peripheral neuropathy    Examination-Activity Limitations Hygiene/Grooming;Locomotion Level;Transfers;Bed Mobility;Bend;Stand     Examination-Participation  Restrictions Community Activity;Cleaning;Meal Prep;Shop;Laundry    Rehab Potential Good    PT Frequency 2x / week    PT Duration 8 weeks    PT Treatment/Interventions ADLs/Self Care Home Management;Aquatic Therapy;Cryotherapy;Electrical Stimulation;Moist Heat;Iontophoresis 4mg /ml Dexamethasone;Traction;Neuromuscular re-education;Therapeutic exercise;Therapeutic activities;Patient/family education;Manual techniques;Dry needling;Taping;Functional mobility training    PT Next Visit Plan discuss continuing POC; continue to progress core and LE strengthening adding funcitonal component as appropriate    PT Home Exercise Plan Access Code:    Consulted and Agree with Plan of Care Patient           Patient will benefit from skilled therapeutic intervention in order to improve the following deficits and impairments:  Dizziness,Difficulty walking,Decreased balance,Impaired sensation  Visit Diagnosis: Acute bilateral low back pain, unspecified whether sciatica present  Muscle weakness (generalized)  Difficulty in walking, not elsewhere classified  Unsteadiness on feet     Problem List Patient Active Problem List   Diagnosis Date Noted  . Obesity 08/19/2020  . Proteinuria 08/19/2020  . Vitamin D deficiency 02/11/2019  . Stage 3 chronic kidney disease (HCC) 02/11/2019  . Mild intermittent asthma without complication 02/11/2019  . Primary osteoarthritis of both hands 11/24/2018  . Primary osteoarthritis of both feet 11/24/2018  . DDD (degenerative disc disease), cervical 11/24/2018  . DDD (degenerative disc disease), lumbar 11/24/2018  . History of stroke involving cerebellum 10/13/2018  . Gait abnormality 10/13/2018  . 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  . Asthma 08/12/2018  . Hypothyroidism 08/12/2018  . Rheumatoid arthritis (HCC) 07/04/2018  . Essential hypertension 07/04/2018  . Hyperlipidemia 07/04/2018  . Hyperglycemia 05/28/2017  .  Seasonal allergic rhinitis 05/28/2017  . Gouty arthropathy 09/15/2010  . Osteoarthrosis, generalized, involving multiple sites 09/15/2010  . Esophagitis 08/30/2010   10/30/2010 PT, DPT  04/17/21 3:34 PM    [facility] [address] [place], [place], [place] Phone: [phone]   Fax:  [phone]  Name: Bobbye Petti MRN: [name] Date of Birth: [birth year]

## 2021-04-19 ENCOUNTER — Other Ambulatory Visit: Payer: Self-pay

## 2021-04-19 ENCOUNTER — Ambulatory Visit: Payer: Medicare Other | Admitting: Physical Therapy

## 2021-04-19 ENCOUNTER — Encounter: Payer: Self-pay | Admitting: Physical Therapy

## 2021-04-19 DIAGNOSIS — R42 Dizziness and giddiness: Secondary | ICD-10-CM | POA: Diagnosis not present

## 2021-04-19 DIAGNOSIS — M545 Low back pain, unspecified: Secondary | ICD-10-CM

## 2021-04-19 DIAGNOSIS — R2681 Unsteadiness on feet: Secondary | ICD-10-CM | POA: Diagnosis not present

## 2021-04-19 DIAGNOSIS — R262 Difficulty in walking, not elsewhere classified: Secondary | ICD-10-CM | POA: Diagnosis not present

## 2021-04-19 DIAGNOSIS — M6281 Muscle weakness (generalized): Secondary | ICD-10-CM | POA: Diagnosis not present

## 2021-04-19 DIAGNOSIS — R2689 Other abnormalities of gait and mobility: Secondary | ICD-10-CM | POA: Diagnosis not present

## 2021-04-19 NOTE — Therapy (Signed)
[facility] [address], [place], [place], [address] Phone: [phone]   Fax:  [phone]  Physical Therapy Treatment  Patient Details  Name: Alexis Shelton MRN: [id number] Date of Birth: January 01, 1941 Referring Provider (PT): Dr. Eunice Blase   Encounter Date: 04/19/2021   PT End of Session - 04/19/21 1711    Visit Number 7    Date for PT Re-Evaluation 05/09/21    Authorization Type Medicare/Aetna    Progress Note Due on Visit 10    PT Start Time 0263    PT Stop Time 1526    PT Time Calculation (min) 41 min    Activity Tolerance Patient tolerated treatment well    Behavior During Therapy [facility] for tasks assessed/performed           Past Medical History:  Diagnosis Date  . Arthritis   . DDD (degenerative disc disease), lumbar 11/24/2018  . Dizziness   . Gallstones   . Hearing loss   . History of stroke involving cerebellum 10/13/2018  . Hypercholesteremia   . Hypertension   . Hypothyroidism   . 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  . Migraines   . Mild intermittent asthma without complication 7/85/8850  . Primary osteoarthritis of both feet 11/24/2018  . Rheumatoid arthritis ([place]) 07/04/2018  . Stage 3 chronic kidney disease ([place]) 02/11/2019  . Vitamin D deficiency 02/11/2019    Past Surgical History:  Procedure Laterality Date  . ABDOMINAL HYSTERECTOMY    . BREAST REDUCTION SURGERY Bilateral   . CATARACT EXTRACTION, BILATERAL    . INCISION AND DRAINAGE BREAST ABSCESS Left   . REDUCTION MAMMAPLASTY    . REPLACEMENT TOTAL KNEE Bilateral   . TONSILLECTOMY AND ADENOIDECTOMY      There were no vitals filed for this visit.   Subjective Assessment - 04/19/21 1707    Subjective "I feel pretty good. I have some leg cramping but that's gout issues."    Pertinent History decreased hearing Lt ear after abdominal sx in 1990 - may have been a small stroke that caused hearing loss; (CVA was in the  cerebellum);  RA, HTN, idiopathic gout, h/o CVA involving cerebellum, OA hands & feet, DDD cervical & lumbar, stage 3 CKD, s/p bil. TKR's, mild peripheral neuropathy; history of vertigo; tried aquatic ex but didn't like    How long can you sit comfortably? feels better    How long can you stand comfortably? hard to cook b/c of hands;  I get tired when I'm on my feet a while    How long can you walk comfortably? with shopping cart OK; without cart painful to walk to car    Diagnostic tests none for back    Patient Stated Goals get rid of pain; decreased reliance on medication; start walking program; sleep better; start an ex program--maybe at the gym    Currently in Pain? No/denies    Pain Onset More than a month ago                             [facility] Adult PT Treatment/Exercise - 04/19/21 0001      Lumbar Exercises: Aerobic   Nustep level 3 x 10 minutes      Lumbar Exercises: Standing   Row Both    Theraband Level (Row) Level 3 (Green)    Row Limitations 2 x 12 repetitions    Shoulder Extension Both;Theraband    Theraband  Level (Shoulder Extension) Level 3 (Green)    Shoulder Extension Limitations x10 repetitions      Knee/Hip Exercises: Standing   Hip Abduction Both;2 sets;10 reps;Knee straight    Abduction Limitations 1.5#                    PT Short Term Goals - 04/19/21 1710      PT SHORT TERM GOAL #1   Title independent with initial HEP    Time 4    Period Weeks    Status Achieved    Target Date 04/11/21      PT SHORT TERM GOAL #2   Title The patient will report a 30% improvement in back pain at night, with standing and walking    Time 4    Period Weeks    Status Achieved    Target Date 04/26/20      PT SHORT TERM GOAL #4   Title Lumbar extension to 15 degrees and hip extension to 10 degrees needed for improved mobility for walking    Time 4    Period Weeks    Status Partially Met   Lt 10 degrees; Rt 14 degrees; lumbar extension 10  degrees   Target Date 04/26/20             PT Long Term Goals - 03/14/21 2011      PT LONG TERM GOAL #1   Title The patient will be independent in a safe self progression of a HEP and/or a gym program    Time 8    Period Weeks    Status New    Target Date 05/09/21      PT LONG TERM GOAL #2   Title The patient will have lumbo/pelvic/hip strength grossly 4+/5 needed for walking/standing for longer periods of time with less discomfort    Time 8    Period Weeks    Status New      PT LONG TERM GOAL #3   Title The patient will report a 60% improvement in back pain at night and with standing/walking    Time 8    Period Weeks    Status New      PT LONG TERM GOAL #4   Title FOTO functional outcome score improved to 68%    Time 8    Period Weeks    Status New                 Plan - 04/19/21 1707    Clinical Impression Statement Patient spending extended portion of session handling home emergency over the phone. Requiring frequent verbal cuing for eccentric control when performing bil hip abduction in standing. Further cuing for decreased speed of movement to allow for full concentric and eccentric contraction during activity. Would benefit from continued skilled therapeutic intervention to address impairments for improved functional activity tolerance.    Personal Factors and Comorbidities Fitness;Age;Time since onset of injury/illness/exacerbation;Comorbidity 2    Comorbidities abdominal hysterectomy with hearing loss Lt ear after surgery, RA; h/o CVA involving cerebellum, HTN, idiopathic gout, OA hands and feet, DDD cervical and lumbar, stage 3 CKD, s/p bil. TKR's, peripheral neuropathy    Examination-Activity Limitations Hygiene/Grooming;Locomotion Level;Transfers;Bed Mobility;Bend;Stand    Examination-Participation Restrictions Community Activity;Cleaning;Meal Prep;Shop;Laundry    Rehab Potential Good    PT Frequency 2x / week    PT Duration 8 weeks    PT  Treatment/Interventions ADLs/Self Care Home Management;Aquatic Therapy;Cryotherapy;Electrical Stimulation;Moist Heat;Iontophoresis 4m/ml Dexamethasone;Traction;Neuromuscular re-education;Therapeutic exercise;Therapeutic activities;Patient/family education;Manual techniques;Dry  needling;Taping;Functional mobility training    PT Next Visit Plan continue to assess for D/C readiness; progess core and LE functional training to patient tolerance    PT Home Exercise Plan Access Code: [email or web address]    Consulted and Agree with Plan of Care Patient           Patient will benefit from skilled therapeutic intervention in order to improve the following deficits and impairments:  Dizziness,Difficulty walking,Decreased balance,Impaired sensation  Visit Diagnosis: Acute bilateral low back pain, unspecified whether sciatica present  Muscle weakness (generalized)  Difficulty in walking, not elsewhere classified     Problem List Patient Active Problem List   Diagnosis Date Noted  . Obesity 08/19/2020  . Proteinuria 08/19/2020  . Vitamin D deficiency 02/11/2019  . Stage 3 chronic kidney disease ([place]) 02/11/2019  . Mild intermittent asthma without complication 17/40/8144  . Primary osteoarthritis of both hands 11/24/2018  . Primary osteoarthritis of both feet 11/24/2018  . DDD (degenerative disc disease), cervical 11/24/2018  . DDD (degenerative disc disease), lumbar 11/24/2018  . History of stroke involving cerebellum 10/13/2018  . Gait abnormality 10/13/2018  . 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  . Asthma 08/12/2018  . Hypothyroidism 08/12/2018  . Rheumatoid arthritis ([place]) 07/04/2018  . Essential hypertension 07/04/2018  . Hyperlipidemia 07/04/2018  . Hyperglycemia 05/28/2017  . Seasonal allergic rhinitis 05/28/2017  . Gouty arthropathy 09/15/2010  . Osteoarthrosis, generalized, involving multiple sites 09/15/2010  . Esophagitis 08/30/2010   Everardo All PT, DPT  04/19/21 5:14  PM   [facility] [address] [place], [place], [address] Phone: [phone]   Fax:  [phone]  Name: Shakeela Rabadan MRN: [id number] Date of Birth: [birth year]

## 2021-04-20 ENCOUNTER — Ambulatory Visit (INDEPENDENT_AMBULATORY_CARE_PROVIDER_SITE_OTHER): Payer: Medicare Other | Admitting: Podiatry

## 2021-04-20 DIAGNOSIS — M79675 Pain in left toe(s): Secondary | ICD-10-CM

## 2021-04-20 DIAGNOSIS — M79674 Pain in right toe(s): Secondary | ICD-10-CM

## 2021-04-20 DIAGNOSIS — B351 Tinea unguium: Secondary | ICD-10-CM | POA: Diagnosis not present

## 2021-04-25 ENCOUNTER — Telehealth: Payer: Self-pay | Admitting: *Deleted

## 2021-04-25 ENCOUNTER — Ambulatory Visit: Payer: Medicare Other | Attending: Family Medicine | Admitting: Physical Therapy

## 2021-04-25 ENCOUNTER — Other Ambulatory Visit: Payer: Self-pay

## 2021-04-25 ENCOUNTER — Other Ambulatory Visit: Payer: Self-pay | Admitting: Podiatry

## 2021-04-25 DIAGNOSIS — R2681 Unsteadiness on feet: Secondary | ICD-10-CM

## 2021-04-25 DIAGNOSIS — R262 Difficulty in walking, not elsewhere classified: Secondary | ICD-10-CM

## 2021-04-25 DIAGNOSIS — M545 Low back pain, unspecified: Secondary | ICD-10-CM

## 2021-04-25 DIAGNOSIS — M6281 Muscle weakness (generalized): Secondary | ICD-10-CM | POA: Diagnosis not present

## 2021-04-25 MED ORDER — CICLOPIROX 8 % EX SOLN: Freq: Every day | CUTANEOUS | 4 refills | Status: DC

## 2021-04-25 NOTE — Telephone Encounter (Signed)
Refilled. Please let her know.

## 2021-04-25 NOTE — Patient Instructions (Signed)
Access Code: [email or web address] URL: [email or web address] Date: 04/25/2021 Prepared by: Lavinia Sharps  Exercises Supine Hip Adduction Isometric with Ball - 1 x daily - 7 x weekly - 2 sets - 10 reps Supine Single Knee to Chest Stretch - 2 x daily - 7 x weekly - 1 sets - 2 reps - 15s hold Supine Lower Trunk Rotation - 2 x daily - 7 x weekly - 1 sets - 10 reps - 15s hold Supine Piriformis Stretch with Leg Straight - 2 x daily - 7 x weekly - 1 sets - 2 reps - 15s hold Seated Hamstring Stretch - 2 x daily - 7 x weekly - 1 sets - 2 reps - 15s hold Seated Hip Flexor Stretch - 2 x daily - 7 x weekly - 1 sets - 2 reps - 15s hold Bridge - 1 x daily - 7 x weekly - 2 sets - 10 reps Standing Row with Anchored Resistance - 1 x daily - 7 x weekly - 2 sets - 10 reps Shoulder Extension with Resistance - 1 x daily - 7 x weekly - 2 sets - 10 reps Shoulder External Rotation with Anchored Resistance - 1 x daily - 7 x weekly - 1 sets - 10 reps

## 2021-04-25 NOTE — Therapy (Signed)
[facility] [address], [place], [place], [address] Phone: [phone]   Fax:  [phone]  Physical Therapy Treatment  Patient Details  Name: Alexis Shelton MRN: [id number] Date of Birth: [birth year] Referring Provider (PT): Dr. Eunice Blase   Encounter Date: 04/25/2021   PT End of Session - 04/25/21 1606    Visit Number 8    Date for PT Re-Evaluation 05/09/21    Authorization Type Medicare/Aetna    Progress Note Due on Visit 10    PT Start Time 0712    PT Stop Time 1608    PT Time Calculation (min) 38 min    Activity Tolerance Patient tolerated treatment well           Past Medical History:  Diagnosis Date  . Arthritis   . DDD (degenerative disc disease), lumbar 11/24/2018  . Dizziness   . Gallstones   . Hearing loss   . History of stroke involving cerebellum 10/13/2018  . Hypercholesteremia   . Hypertension   . Hypothyroidism   . 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  . Migraines   . Mild intermittent asthma without complication 1/97/5883  . Primary osteoarthritis of both feet 11/24/2018  . Rheumatoid arthritis ([place]) 07/04/2018  . Stage 3 chronic kidney disease ([place]) 02/11/2019  . Vitamin D deficiency 02/11/2019    Past Surgical History:  Procedure Laterality Date  . ABDOMINAL HYSTERECTOMY    . BREAST REDUCTION SURGERY Bilateral   . CATARACT EXTRACTION, BILATERAL    . INCISION AND DRAINAGE BREAST ABSCESS Left   . REDUCTION MAMMAPLASTY    . REPLACEMENT TOTAL KNEE Bilateral   . TONSILLECTOMY AND ADENOIDECTOMY      There were no vitals filed for this visit.   Subjective Assessment - 04/25/21 1536    Subjective I'm getting better.  My back is feeling better.  I think the resistive ex's are helping.    Pertinent History decreased hearing Lt ear after abdominal sx in 1990 - may have been a small stroke that caused hearing loss; (CVA was in the cerebellum);  RA, HTN, idiopathic gout, h/o  CVA involving cerebellum, OA hands & feet, DDD cervical & lumbar, stage 3 CKD, s/p bil. TKR's, mild peripheral neuropathy; history of vertigo; tried aquatic ex but didn't like    How long can you stand comfortably? hard to cook b/c of hands;  I get tired when I'm on my feet a while    How long can you walk comfortably? with shopping cart OK; without cart painful to walk to car    Patient Stated Goals get rid of pain; decreased reliance on medication; start walking program; sleep better; start an ex program--maybe at the gym    Currently in Pain? Yes    Pain Score 1     Pain Location Back    Pain Type Acute pain                             [facility] Adult PT Treatment/Exercise - 04/25/21 0001      Therapeutic Activites    Therapeutic Activities ADL's    ADL's 2# snatch and press overhead 5x right/left    Lifting dead lifts 7#      Lumbar Exercises: Aerobic   Nustep L4 10 min while discussing progress/status      Lumbar Exercises: Standing   Row Both;20 reps;Theraband    Theraband Level (Row) Level 3 (Green)  Shoulder Extension Both;20 reps;Theraband    Theraband Level (Shoulder Extension) Level 3 (Green)    Other Standing Lumbar Exercises green band shoulder external rotation 2x 10    Other Standing Lumbar Exercises 7# modified dead lifts to knee level 10x      Knee/Hip Exercises: Standing   Forward Step Up Right;Left;1 set;10 reps                  PT Education - 04/25/21 1605    Education Details green band rows, extensions. shoulder external rotation    Person(s) Educated Patient    Methods Explanation;Demonstration;Handout    Comprehension Returned demonstration;Verbalized understanding            PT Short Term Goals - 04/25/21 1724      PT SHORT TERM GOAL #1   Title independent with initial HEP    Status Achieved      PT SHORT TERM GOAL #2   Title The patient will report a 30% improvement in back pain at night, with standing and walking     Status Achieved      PT SHORT TERM GOAL #3   Title The patient will have improved FOTO score to 60%    Baseline 56%    Time 4    Period Weeks    Status On-going      PT SHORT TERM GOAL #4   Title Lumbar extension to 15 degrees and hip extension to 10 degrees needed for improved mobility for walking    Time 4    Period Weeks    Status Partially Met    Target Date 04/26/20             PT Long Term Goals - 03/14/21 2011      PT LONG TERM GOAL #1   Title The patient will be independent in a safe self progression of a HEP and/or a gym program    Time 8    Period Weeks    Status New    Target Date 05/09/21      PT LONG TERM GOAL #2   Title The patient will have lumbo/pelvic/hip strength grossly 4+/5 needed for walking/standing for longer periods of time with less discomfort    Time 8    Period Weeks    Status New      PT LONG TERM GOAL #3   Title The patient will report a 60% improvement in back pain at night and with standing/walking    Time 8    Period Weeks    Status New      PT LONG TERM GOAL #4   Title FOTO functional outcome score improved to 68%    Time 8    Period Weeks    Status New                 Plan - 04/25/21 1607    Clinical Impression Statement The patient reports improving LBP since start of care.  She is able to perform a progression of resistive strengthening including bands and light weights.  She denies back pain during performance of these.  Therapist monitoring response throughout treatment session.    Comorbidities abdominal hysterectomy with hearing loss Lt ear after surgery, RA; h/o CVA involving cerebellum, HTN, idiopathic gout, OA hands and feet, DDD cervical and lumbar, stage 3 CKD, s/p bil. TKR's, peripheral neuropathy    Examination-Activity Limitations Hygiene/Grooming;Locomotion Level;Transfers;Bed Mobility;Bend;Stand    Examination-Participation Restrictions Community Activity;Cleaning;Meal Prep;Shop;Laundry     Stability/Clinical  Decision Making Evolving/Moderate complexity    Rehab Potential Good    PT Frequency 2x / week    PT Duration 8 weeks    PT Treatment/Interventions ADLs/Self Care Home Management;Aquatic Therapy;Cryotherapy;Electrical Stimulation;Moist Heat;Iontophoresis 41m/ml Dexamethasone;Traction;Neuromuscular re-education;Therapeutic exercise;Therapeutic activities;Patient/family education;Manual techniques;Dry needling;Taping;Functional mobility training    PT Next Visit Plan add to HEP modified dead lifts, snatch and press overhead, step ups to HEP (pt has 5# and 7# weights at home)    PT Home Exercise Plan Access Code: [email or web address]          Patient will benefit from skilled therapeutic intervention in order to improve the following deficits and impairments:  Dizziness,Difficulty walking,Decreased balance,Impaired sensation  Visit Diagnosis: Acute bilateral low back pain, unspecified whether sciatica present  Muscle weakness (generalized)  Difficulty in walking, not elsewhere classified  Unsteadiness on feet     Problem List Patient Active Problem List   Diagnosis Date Noted  . Obesity 08/19/2020  . Proteinuria 08/19/2020  . Vitamin D deficiency 02/11/2019  . Stage 3 chronic kidney disease ([place] 02/11/2019  . Mild intermittent asthma without complication 024/46/2863 . Primary osteoarthritis of both hands 11/24/2018  . Primary osteoarthritis of both feet 11/24/2018  . DDD (degenerative disc disease), cervical 11/24/2018  . DDD (degenerative disc disease), lumbar 11/24/2018  . History of stroke involving cerebellum 10/13/2018  . Gait abnormality 10/13/2018  . 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  . Asthma 08/12/2018  . Hypothyroidism 08/12/2018  . Rheumatoid arthritis ([place] 07/04/2018  . Essential hypertension 07/04/2018  . Hyperlipidemia 07/04/2018  . Hyperglycemia 05/28/2017  . Seasonal allergic rhinitis 05/28/2017  . Gouty arthropathy 09/15/2010  .  Osteoarthrosis, generalized, involving multiple sites 09/15/2010  . Esophagitis 08/30/2010   SRuben Im PT 04/25/21 5:26 PM Phone: [phone]: [phone] Singh5/02/2021, 5:25 PM  [facility] [address] [place] [address]Phone: [phone]  Fax:  [phone] Name: Alexis MenendezMRN: [id number] of Birth: [birth year]

## 2021-04-25 NOTE — Telephone Encounter (Signed)
Patient is calling for status of a nail medication(Penlac?) that was suppose to be sent to her local pharmacy, called the store but it is not there. Please advise.

## 2021-04-25 NOTE — Telephone Encounter (Addendum)
Called patient but could not leave message,mail box full, will try again later. Returned call to patient could not leave message, mail box full, 3 attempt.

## 2021-04-25 NOTE — Progress Notes (Signed)
Subjective: 80 year old female presents the office today for follow-up evaluation of symptomatic onychomycosis.  She has been using topical Penlac and she feels the color has been doing better.  Nails are still somewhat thickened discolored and are getting long and she cannot trim her self which causes discomfort with shoes. Denies any systemic complaints such as fevers, chills, nausea, vomiting. No acute changes since last appointment, and no other complaints at this time.   Objective: AAO x3, NAD DP/PT pulses palpable bilaterally, CRT less than 3 seconds Nails continue be hypertrophic, dystrophic with yellow-brown discoloration at the distal portion.  There is simply on the proximal nail fold.  There is tenderness nails 1-5 bilaterally as they are elongated.  No edema, erythema or signs of infection.  No open lesions. No pain with calf compression, swelling, warmth, erythema  Assessment: Symptomatic onychomycosis  Plan: -All treatment options discussed with the patient including all alternatives, risks, complications.  -Nails sharply debrided x10 without any complications or bleeding.  Continue Penlac. -Patient encouraged to call the office with any questions, concerns, change in symptoms.   Vivi Barrack DPM

## 2021-04-27 ENCOUNTER — Ambulatory Visit: Payer: Medicare Other | Admitting: Physical Therapy

## 2021-04-27 ENCOUNTER — Other Ambulatory Visit: Payer: Self-pay

## 2021-04-27 DIAGNOSIS — R262 Difficulty in walking, not elsewhere classified: Secondary | ICD-10-CM | POA: Diagnosis not present

## 2021-04-27 DIAGNOSIS — M545 Low back pain, unspecified: Secondary | ICD-10-CM

## 2021-04-27 DIAGNOSIS — R2681 Unsteadiness on feet: Secondary | ICD-10-CM | POA: Diagnosis not present

## 2021-04-27 DIAGNOSIS — M6281 Muscle weakness (generalized): Secondary | ICD-10-CM

## 2021-04-27 NOTE — Therapy (Signed)
[facility] [address] [place], [place], [address] Phone: [phone]   Fax:  [phone]  Physical Therapy Treatment  Patient Details  Name: Alexis Shelton MRN: [id number] Date of Birth: March 10, 1941 Referring Provider (PT): Dr. Eunice Blase   Encounter Date: 04/27/2021   PT End of Session - 04/27/21 1748    Visit Number 9    Date for PT Re-Evaluation 05/09/21    Authorization Type Medicare/Aetna    Progress Note Due on Visit 10    PT Start Time 2355    PT Stop Time 1613    PT Time Calculation (min) 31 min    Activity Tolerance Patient tolerated treatment well    Behavior During Therapy [facility] for tasks assessed/performed           Past Medical History:  Diagnosis Date  . Arthritis   . DDD (degenerative disc disease), lumbar 11/24/2018  . Dizziness   . Gallstones   . Hearing loss   . History of stroke involving cerebellum 10/13/2018  . Hypercholesteremia   . Hypertension   . Hypothyroidism   . 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  . Migraines   . Mild intermittent asthma without complication 7/32/2025  . Primary osteoarthritis of both feet 11/24/2018  . Rheumatoid arthritis ([place]) 07/04/2018  . Stage 3 chronic kidney disease ([place]) 02/11/2019  . Vitamin D deficiency 02/11/2019    Past Surgical History:  Procedure Laterality Date  . ABDOMINAL HYSTERECTOMY    . BREAST REDUCTION SURGERY Bilateral   . CATARACT EXTRACTION, BILATERAL    . INCISION AND DRAINAGE BREAST ABSCESS Left   . REDUCTION MAMMAPLASTY    . REPLACEMENT TOTAL KNEE Bilateral   . TONSILLECTOMY AND ADENOIDECTOMY      There were no vitals filed for this visit.   Subjective Assessment - 04/27/21 1553    Subjective "Today has been really stressful"    Pertinent History decreased hearing Lt ear after abdominal sx in 1990 - may have been a small stroke that caused hearing loss; (CVA was in the cerebellum);  RA, HTN, idiopathic gout,  h/o CVA involving cerebellum, OA hands & feet, DDD cervical & lumbar, stage 3 CKD, s/p bil. TKR's, mild peripheral neuropathy; history of vertigo; tried aquatic ex but didn't like    How long can you sit comfortably? feels better    How long can you stand comfortably? hard to cook b/c of hands;  I get tired when I'm on my feet a while    How long can you walk comfortably? with shopping cart OK; without cart painful to walk to car    Diagnostic tests none for back    Patient Stated Goals get rid of pain; decreased reliance on medication; start walking program; sleep better; start an ex program--maybe at the gym    Currently in Pain? No/denies                             [facility] Adult PT Treatment/Exercise - 04/27/21 0001      Lumbar Exercises: Aerobic   Nustep L4 5 min while discussing progress/status      Lumbar Exercises: Standing   Heel Raises 20 reps    Heel Raises Limitations hand hold x2    Row Both    Theraband Level (Row) Level 4 (Blue)    Row Limitations 2x10 repetitions    Shoulder Extension Both;20 reps;Theraband    Theraband Level (Shoulder  Extension) Level 3 (Green)    Other Standing Lumbar Exercises lat pull down; red tband over door; 2 x 12 repetitions    Other Standing Lumbar Exercises wall push up x10 repetitions      Knee/Hip Exercises: Standing   Hip Abduction Both;2 sets;10 reps    Abduction Limitations 1.5lbs                    PT Short Term Goals - 04/25/21 1724      PT SHORT TERM GOAL #1   Title independent with initial HEP    Status Achieved      PT SHORT TERM GOAL #2   Title The patient will report a 30% improvement in back pain at night, with standing and walking    Status Achieved      PT SHORT TERM GOAL #3   Title The patient will have improved FOTO score to 60%    Baseline 56%    Time 4    Period Weeks    Status On-going      PT SHORT TERM GOAL #4   Title Lumbar extension to 15 degrees and hip extension to 10  degrees needed for improved mobility for walking    Time 4    Period Weeks    Status Partially Met    Target Date 04/26/20             PT Long Term Goals - 04/27/21 1747      PT LONG TERM GOAL #1   Title The patient will be independent in a safe self progression of a HEP and/or a gym program    Time 8    Period Weeks    Status On-going      PT LONG TERM GOAL #3   Title The patient will report a 60% improvement in back pain at night and with standing/walking    Time 8    Period Weeks    Status Achieved   1/10 pain last session; 0/10 pain this session                Plan - 04/27/21 1745    Clinical Impression Statement Patient reporting 12 minutes late for session. States that she continues to feel better and would be ready for D/C next session. Requiring verbal and tactile cuing for decreased excessive scapular retraction during row exercise.    Personal Factors and Comorbidities Fitness;Age;Time since onset of injury/illness/exacerbation;Comorbidity 2    Comorbidities abdominal hysterectomy with hearing loss Lt ear after surgery, RA; h/o CVA involving cerebellum, HTN, idiopathic gout, OA hands and feet, DDD cervical and lumbar, stage 3 CKD, s/p bil. TKR's, peripheral neuropathy    Examination-Activity Limitations Hygiene/Grooming;Locomotion Level;Transfers;Bed Mobility;Bend;Stand    Examination-Participation Restrictions Community Activity;Cleaning;Meal Prep;Shop;Laundry    Rehab Potential Good    PT Frequency 2x / week    PT Duration 8 weeks    PT Treatment/Interventions ADLs/Self Care Home Management;Aquatic Therapy;Cryotherapy;Electrical Stimulation;Moist Heat;Iontophoresis 28m/ml Dexamethasone;Traction;Neuromuscular re-education;Therapeutic exercise;Therapeutic activities;Patient/family education;Manual techniques;Dry needling;Taping;Functional mobility training    PT Next Visit Plan assess readiness for d/c; finalize HEP    PT Home Exercise Plan Access Code: [email or web address]    Consulted and Agree with Plan of Care Patient           Patient will benefit from skilled therapeutic intervention in order to improve the following deficits and impairments:  Dizziness,Difficulty walking,Decreased balance,Impaired sensation  Visit Diagnosis: Acute bilateral low back pain, unspecified whether sciatica present  Muscle weakness (generalized)  Difficulty in walking, not elsewhere classified  Unsteadiness on feet     Problem List Patient Active Problem List   Diagnosis Date Noted  . Obesity 08/19/2020  . Proteinuria 08/19/2020  . Vitamin D deficiency 02/11/2019  . Stage 3 chronic kidney disease ([place]) 02/11/2019  . Mild intermittent asthma without complication 22/29/7989  . Primary osteoarthritis of both hands 11/24/2018  . Primary osteoarthritis of both feet 11/24/2018  . DDD (degenerative disc disease), cervical 11/24/2018  . DDD (degenerative disc disease), lumbar 11/24/2018  . History of stroke involving cerebellum 10/13/2018  . Gait abnormality 10/13/2018  . 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  . Asthma 08/12/2018  . Hypothyroidism 08/12/2018  . Rheumatoid arthritis ([place]) 07/04/2018  . Essential hypertension 07/04/2018  . Hyperlipidemia 07/04/2018  . Hyperglycemia 05/28/2017  . Seasonal allergic rhinitis 05/28/2017  . Gouty arthropathy 09/15/2010  . Osteoarthrosis, generalized, involving multiple sites 09/15/2010  . Esophagitis 08/30/2010    Everardo All PT, DPT  04/27/21 5:49 PM    [facility] [address] [place], [place], [address] Phone: [phone]   Fax:  [phone]  Name: Alexis Shelton MRN: [id number] Date of Birth: 1941/12/19

## 2021-05-02 ENCOUNTER — Other Ambulatory Visit: Payer: Self-pay

## 2021-05-02 ENCOUNTER — Ambulatory Visit: Payer: Medicare Other | Admitting: Physical Therapy

## 2021-05-02 DIAGNOSIS — M545 Low back pain, unspecified: Secondary | ICD-10-CM

## 2021-05-02 DIAGNOSIS — R2681 Unsteadiness on feet: Secondary | ICD-10-CM

## 2021-05-02 DIAGNOSIS — R262 Difficulty in walking, not elsewhere classified: Secondary | ICD-10-CM | POA: Diagnosis not present

## 2021-05-02 DIAGNOSIS — M6281 Muscle weakness (generalized): Secondary | ICD-10-CM

## 2021-05-02 NOTE — Therapy (Signed)
[facility] [address] [place], [place], [address] Phone: [phone]   Fax:  [phone]  Physical Therapy Treatment  Patient Details  Name: Alexis Shelton MRN: [id number] Date of Birth: [birth year] Referring Provider (PT): Dr. Lavada Mesi   Encounter Date: 05/02/2021   Progress Note Reporting Period 03/14/2021 to 05/02/2021  See note below for Objective Data and Assessment of Progress/Goals.        PT End of Session - 05/02/21 1716    Visit Number 10    Date for PT Re-Evaluation 07/04/21    Authorization Type Medicare/Aetna    Progress Note Due on Visit 20    PT Start Time 1531    PT Stop Time 1614    PT Time Calculation (min) 43 min    Activity Tolerance Patient tolerated treatment well    Behavior During Therapy WFL for tasks assessed/performed           Past Medical History:  Diagnosis Date  . Arthritis   . DDD (degenerative disc disease), lumbar 11/24/2018  . Dizziness   . Gallstones   . Hearing loss   . History of stroke involving cerebellum 10/13/2018  . Hypercholesteremia   . Hypertension   . Hypothyroidism   . 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  . Migraines   . Mild intermittent asthma without complication 02/11/2019  . Primary osteoarthritis of both feet 11/24/2018  . Rheumatoid arthritis (HCC) 07/04/2018  . Stage 3 chronic kidney disease (HCC) 02/11/2019  . Vitamin D deficiency 02/11/2019    Past Surgical History:  Procedure Laterality Date  . ABDOMINAL HYSTERECTOMY    . BREAST REDUCTION SURGERY Bilateral   . CATARACT EXTRACTION, BILATERAL    . INCISION AND DRAINAGE BREAST ABSCESS Left   . REDUCTION MAMMAPLASTY    . REPLACEMENT TOTAL KNEE Bilateral   . TONSILLECTOMY AND ADENOIDECTOMY      There were no vitals filed for this visit.   Subjective Assessment - 05/02/21 1546    Subjective Patient reports increased rib cage pain this date. Has had a lot of stress as  her son continues to deal with mental illness. Continues to have a lot of pain when she first wakes up but it resolves by about noon each day. Has decided that she would like to continue further with therapy    Pertinent History decreased hearing Lt ear after abdominal sx in 1990 - may have been a small stroke that caused hearing loss; (CVA was in the cerebellum);  RA, HTN, idiopathic gout, h/o CVA involving cerebellum, OA hands & feet, DDD cervical & lumbar, stage 3 CKD, s/p bil. TKR's, mild peripheral neuropathy; history of vertigo; tried aquatic ex but didn't like    How long can you sit comfortably? feels better    How long can you stand comfortably? hard to cook b/c of hands;  I get tired when I'm on my feet a while    How long can you walk comfortably? with shopping cart OK; without cart painful to walk to car    Diagnostic tests none for back    Patient Stated Goals get rid of pain; decreased reliance on medication; start walking program; sleep better; start an ex program--maybe at the gym    Currently in Pain? Yes    Pain Score 5     Pain Location Rib cage    Pain Orientation Left    Pain Descriptors / Indicators Aching    Pain Type Acute pain  Pain Onset Yesterday    Pain Frequency Constant              [facility] PT Assessment - 05/02/21 0001      Assessment   Medical Diagnosis left hip pain; LBP    Referring Provider (PT) Dr. Casimiro Needle Hilts    Onset Date/Surgical Date --   January   Prior Therapy pelvic floor with Elnita Maxwell; vestibular rehab      Precautions   Precautions Fall      Restrictions   Weight Bearing Restrictions No      Balance Screen   Has the patient fallen in the past 6 months No    Has the patient had a decrease in activity level because of a fear of falling?  Yes    Is the patient reluctant to leave their home because of a fear of falling?  No      Home Environment   Living Environment Private residence    Living Arrangements Alone    Type of Home  Apartment    Home Access Level entry    Home Layout One level      Prior Function   Level of Independence Independent    Vocation Retired      Observation/Other Assessments   Focus on Therapeutic Outcomes (FOTO)  47 (goal 68)      AROM   Right Hip Extension 14    Left Hip Extension 10    Lumbar Extension 10                         [facility] Adult PT Treatment/Exercise - 05/02/21 0001      Lumbar Exercises: Aerobic   Nustep L4 x 10 minutes while discussing progess with PT      Lumbar Exercises: Standing   Heel Raises 15 reps    Heel Raises Limitations hand hold x 2    Row Power tower;Both;10 reps    Row Limitations 20 lbs    Other Standing Lumbar Exercises lat pull down; blue tband over door; 2 x 10 repetitions   verbal cues for decreased shoulder elevation   Other Standing Lumbar Exercises staggered stance row at high table; 3 lbs; x10 repetitions bil UE      Knee/Hip Exercises: Standing   Hip Abduction Both;1 set;10 reps    Abduction Limitations 1.5 lbs    Hip Extension Both;1 set;10 reps    Extension Limitations 1.5 lbs; verbal cues for decreased trunk flexion                    PT Short Term Goals - 05/02/21 1551      PT SHORT TERM GOAL #1   Title independent with initial HEP    Time 4    Period Weeks    Status Achieved    Target Date 04/11/21      PT SHORT TERM GOAL #2   Title The patient will report a 30% improvement in back pain at night, with standing and walking    Time 4    Period Weeks    Status Achieved   Previously achieved with patient reporting 0/10 back pain last session   Target Date 04/26/20      PT SHORT TERM GOAL #3   Title The patient will have improved FOTO score to 60%    Baseline 56%    Time 4    Period Weeks    Status On-going   47  Target Date 04/26/20      PT SHORT TERM GOAL #4   Title Lumbar extension to 15 degrees and hip extension to 10 degrees needed for improved mobility for walking    Time 4     Period Weeks    Target Date 04/26/20             PT Long Term Goals - 05/02/21 1551      PT LONG TERM GOAL #1   Title The patient will be independent in a safe self progression of a HEP and/or a gym program    Time 8    Period Weeks    Status On-going      PT LONG TERM GOAL #2   Title The patient will have lumbo/pelvic/hip strength grossly 4+/5 needed for walking/standing for longer periods of time with less discomfort    Time 8    Period Weeks    Status On-going      PT LONG TERM GOAL #3   Title The patient will report a 60% improvement in back pain at night and with standing/walking    Time 8    Period Weeks    Status On-going   1/10 pain session before last; 0/10 pain last session; increased pain this session     PT LONG TERM GOAL #4   Title FOTO functional outcome score improved to 68%    Time 8    Period Weeks    Status On-going   79                Plan - 05/02/21 1709    Clinical Impression Statement Patient is an 80 y/o female referred due to low back pain and Lt hip pain. Lt hip pain appears largely resolved as patient has not reported hip pain in >3 weeks. Back pain appeared to be resolving as patient reporting 1/10 pain session before last and 0/10 pain last session however, patient reporting 6/10 pain this session though she fluctuates between this being new onset Rt rib cage pain or back pain. Lumbar extension AROM continues to be significantly limited as she is unable to extend past 10 degrees. Patient reports functional mobility improvements as she is beginning to have confidence to begin walking for exercise again. Would benefit from continued skilled intervention to address impairments for decreased pain to improve functional mobility and activity tolerance.    Personal Factors and Comorbidities Fitness;Age;Time since onset of injury/illness/exacerbation;Comorbidity 2    Comorbidities abdominal hysterectomy with hearing loss Lt ear after surgery, RA; h/o  CVA involving cerebellum, HTN, idiopathic gout, OA hands and feet, DDD cervical and lumbar, stage 3 CKD, s/p bil. TKR's, peripheral neuropathy    Examination-Activity Limitations Hygiene/Grooming;Locomotion Level;Transfers;Bed Mobility;Bend;Stand    Examination-Participation Restrictions Community Activity;Cleaning;Meal Prep;Shop;Laundry    Stability/Clinical Decision Making Evolving/Moderate complexity    Clinical Decision Making Moderate    Rehab Potential Good    PT Frequency 2x / week    PT Duration 8 weeks    PT Treatment/Interventions ADLs/Self Care Home Management;Aquatic Therapy;Cryotherapy;Electrical Stimulation;Moist Heat;Iontophoresis 4mg /ml Dexamethasone;Traction;Neuromuscular re-education;Therapeutic exercise;Therapeutic activities;Patient/family education;Manual techniques;Dry needling;Taping;Functional mobility training    PT Next Visit Plan continue core and glute strengthening    PT Home Exercise Plan Access Code: [email or web address]    Consulted and Agree with Plan of Care Patient           Patient will benefit from skilled therapeutic intervention in order to improve the following deficits and impairments:  Dizziness,Difficulty walking,Decreased balance,Impaired sensation  Visit Diagnosis:  Acute bilateral low back pain, unspecified whether sciatica present - Plan: PT plan of care cert/re-cert  Muscle weakness (generalized) - Plan: PT plan of care cert/re-cert  Difficulty in walking, not elsewhere classified - Plan: PT plan of care cert/re-cert  Unsteadiness on feet - Plan: PT plan of care cert/re-cert     Problem List Patient Active Problem List   Diagnosis Date Noted  . Obesity 08/19/2020  . Proteinuria 08/19/2020  . Vitamin D deficiency 02/11/2019  . Stage 3 chronic kidney disease (HCC) 02/11/2019  . Mild intermittent asthma without complication 02/11/2019  . Primary osteoarthritis of both hands 11/24/2018  . Primary osteoarthritis of both feet 11/24/2018  . DDD  (degenerative disc disease), cervical 11/24/2018  . DDD (degenerative disc disease), lumbar 11/24/2018  . History of stroke involving cerebellum 10/13/2018  . Gait abnormality 10/13/2018  . 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  . Asthma 08/12/2018  . Hypothyroidism 08/12/2018  . Rheumatoid arthritis (HCC) 07/04/2018  . Essential hypertension 07/04/2018  . Hyperlipidemia 07/04/2018  . Hyperglycemia 05/28/2017  . Seasonal allergic rhinitis 05/28/2017  . Gouty arthropathy 09/15/2010  . Osteoarthrosis, generalized, involving multiple sites 09/15/2010  . Esophagitis 08/30/2010   Anabel Halon PT, DPT  05/02/21 5:19 PM    [facility] [address], [address] [place], [place], [address] Phone: [phone]   Fax:  [phone]  Name: Lylian Sanagustin MRN: [id number] Date of Birth: [birth year]

## 2021-05-04 ENCOUNTER — Encounter: Payer: Medicare Other | Admitting: Physical Therapy

## 2021-05-05 ENCOUNTER — Encounter: Payer: Self-pay | Admitting: Family Medicine

## 2021-05-05 DIAGNOSIS — R102 Pelvic and perineal pain: Secondary | ICD-10-CM

## 2021-05-08 MED ORDER — FLUCONAZOLE 150 MG PO TABS: 150.0000 mg | ORAL_TABLET | ORAL | 0 refills | Status: DC

## 2021-05-08 NOTE — Addendum Note (Signed)
Addended by: Lillia Carmel on: 05/08/2021 07:52 AM   Modules accepted: Orders

## 2021-05-09 ENCOUNTER — Ambulatory Visit: Payer: Medicare Other | Admitting: Physical Therapy

## 2021-05-09 ENCOUNTER — Telehealth: Payer: Self-pay | Admitting: Physical Therapy

## 2021-05-09 NOTE — Telephone Encounter (Signed)
Patient arriving at 2:30 for 2:00 appointment and was under the impression that appointment was at 2:45. Front desk providing written reminder for next appointment. Patient unable to be re-scheduled this date as therapist schedule full for remainder of day. Patient verbalizing understanding.

## 2021-05-16 ENCOUNTER — Other Ambulatory Visit: Payer: Self-pay

## 2021-05-16 ENCOUNTER — Ambulatory Visit: Payer: Medicare Other | Admitting: Physical Therapy

## 2021-05-16 DIAGNOSIS — M545 Low back pain, unspecified: Secondary | ICD-10-CM | POA: Diagnosis not present

## 2021-05-16 DIAGNOSIS — R2681 Unsteadiness on feet: Secondary | ICD-10-CM | POA: Diagnosis not present

## 2021-05-16 DIAGNOSIS — R262 Difficulty in walking, not elsewhere classified: Secondary | ICD-10-CM

## 2021-05-16 DIAGNOSIS — M6281 Muscle weakness (generalized): Secondary | ICD-10-CM | POA: Diagnosis not present

## 2021-05-16 NOTE — Therapy (Signed)
[facility] [address] [place], [place], [address] Phone: [phone]   Fax:  [phone]  Physical Therapy Treatment  Patient Details  Name: Alexis Shelton MRN: [id number] Date of Birth: December 08, 1941 Referring Provider (PT): Dr. Lavada Mesi   Encounter Date: 05/16/2021   PT End of Session - 05/16/21 1610    Visit Number 11    Date for PT Re-Evaluation 07/04/21    Authorization Type Medicare/Aetna    Progress Note Due on Visit 20    PT Start Time 1538   pt late   PT Stop Time 1616    PT Time Calculation (min) 38 min    Activity Tolerance Patient tolerated treatment well           Past Medical History:  Diagnosis Date  . Arthritis   . DDD (degenerative disc disease), lumbar 11/24/2018  . Dizziness   . Gallstones   . Hearing loss   . History of stroke involving cerebellum 10/13/2018  . Hypercholesteremia   . Hypertension   . Hypothyroidism   . 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  . Migraines   . Mild intermittent asthma without complication 02/11/2019  . Primary osteoarthritis of both feet 11/24/2018  . Rheumatoid arthritis (HCC) 07/04/2018  . Stage 3 chronic kidney disease (HCC) 02/11/2019  . Vitamin D deficiency 02/11/2019    Past Surgical History:  Procedure Laterality Date  . ABDOMINAL HYSTERECTOMY    . BREAST REDUCTION SURGERY Bilateral   . CATARACT EXTRACTION, BILATERAL    . INCISION AND DRAINAGE BREAST ABSCESS Left   . REDUCTION MAMMAPLASTY    . REPLACEMENT TOTAL KNEE Bilateral   . TONSILLECTOMY AND ADENOIDECTOMY      There were no vitals filed for this visit.   Subjective Assessment - 05/16/21 1539    Subjective Some GI issues.  No LBP today.  I have some trouble with the area b/w my shoulder blaces.    Pertinent History decreased hearing Lt ear after abdominal sx in 1990 - may have been a small stroke that caused hearing loss; (CVA was in the cerebellum);  RA, HTN, idiopathic  gout, h/o CVA involving cerebellum, OA hands & feet, DDD cervical & lumbar, stage 3 CKD, s/p bil. TKR's, mild peripheral neuropathy; history of vertigo; tried aquatic ex but didn't like    How long can you stand comfortably? hard to cook b/c of hands;  I get tired when I'm on my feet a while    Patient Stated Goals get rid of pain; decreased reliance on medication; start walking program; sleep better; start an ex program--maybe at the gym    Currently in Pain? No/denies    Pain Score 0-No pain                             [facility] Adult PT Treatment/Exercise - 05/16/21 0001      Lumbar Exercises: Aerobic   Nustep L2x 10 minutes while discussing progess with PT      Lumbar Exercises: Standing   Row Both;20 reps;Theraband    Theraband Level (Row) Level 3 (Green)    Shoulder Extension Both;20 reps;Theraband    Theraband Level (Shoulder Extension) Level 3 (Green)    Other Standing Lumbar Exercises 2 5# weights 10x partial dead lift      Lumbar Exercises: Seated   Sit to Stand 5 reps    Sit to Stand Limitations 5#  Knee/Hip Exercises: Standing   Forward Step Up Right;Left;1 set;5 reps    Forward Step Up Limitations holding 5# weight    Other Standing Knee Exercises towel slides hip extension and abduction 8x right/left    Other Standing Knee Exercises 2 foot stance Pallof with red band 5 reps each: press out, press up; attempted marching and retro step but difficulty balancing                    PT Short Term Goals - 05/02/21 1551      PT SHORT TERM GOAL #1   Title independent with initial HEP    Time 4    Period Weeks    Status Achieved    Target Date 04/11/21      PT SHORT TERM GOAL #2   Title The patient will report a 30% improvement in back pain at night, with standing and walking    Time 4    Period Weeks    Status Achieved   Previously achieved with patient reporting 0/10 back pain last session   Target Date 04/26/20      PT SHORT TERM GOAL  #3   Title The patient will have improved FOTO score to 60%    Baseline 56%    Time 4    Period Weeks    Status On-going   47   Target Date 04/26/20      PT SHORT TERM GOAL #4   Title Lumbar extension to 15 degrees and hip extension to 10 degrees needed for improved mobility for walking    Time 4    Period Weeks    Target Date 04/26/20             PT Long Term Goals - 05/02/21 1551      PT LONG TERM GOAL #1   Title The patient will be independent in a safe self progression of a HEP and/or a gym program    Time 8    Period Weeks    Status On-going      PT LONG TERM GOAL #2   Title The patient will have lumbo/pelvic/hip strength grossly 4+/5 needed for walking/standing for longer periods of time with less discomfort    Time 8    Period Weeks    Status On-going      PT LONG TERM GOAL #3   Title The patient will report a 60% improvement in back pain at night and with standing/walking    Time 8    Period Weeks    Status On-going   1/10 pain session before last; 0/10 pain last session; increased pain this session     PT LONG TERM GOAL #4   Title FOTO functional outcome score improved to 68%    Time 8    Period Weeks    Status On-going   47                Plan - 05/16/21 1611    Clinical Impression Statement The patient denies LBP today with core and general strengthening ex's.  She has difficulty with single limb stance ex's which she attributes to her history of vertigo.  Some short rest breaks needed to rest/catch her breath (possibly due to mask wearing) but overall responds well to all treatment interventions.  Therapist monitoring response and providing verbal cues for activation of lumbo/pelvic/hip core muscles.    Comorbidities abdominal hysterectomy with hearing loss Lt ear after surgery, RA; h/o CVA involving  cerebellum, HTN, idiopathic gout, OA hands and feet, DDD cervical and lumbar, stage 3 CKD, s/p bil. TKR's, peripheral neuropathy     Examination-Participation Restrictions Community Activity;Cleaning;Meal Prep;Shop;Laundry    Rehab Potential Good    PT Frequency 2x / week    PT Duration 8 weeks    PT Treatment/Interventions ADLs/Self Care Home Management;Aquatic Therapy;Cryotherapy;Electrical Stimulation;Moist Heat;Iontophoresis 4mg /ml Dexamethasone;Traction;Neuromuscular re-education;Therapeutic exercise;Therapeutic activities;Patient/family education;Manual techniques;Dry needling;Taping;Functional mobility training    PT Next Visit Plan continue core and glute strengthening    PT Home Exercise Plan Access Code:           Patient will benefit from skilled therapeutic intervention in order to improve the following deficits and impairments:  Dizziness,Difficulty walking,Decreased balance,Impaired sensation  Visit Diagnosis: Acute bilateral low back pain, unspecified whether sciatica present  Muscle weakness (generalized)  Difficulty in walking, not elsewhere classified     Problem List Patient Active Problem List   Diagnosis Date Noted  . Obesity 08/19/2020  . Proteinuria 08/19/2020  . Vitamin D deficiency 02/11/2019  . Stage 3 chronic kidney disease (HCC) 02/11/2019  . Mild intermittent asthma without complication 02/11/2019  . Primary osteoarthritis of both hands 11/24/2018  . Primary osteoarthritis of both feet 11/24/2018  . DDD (degenerative disc disease), cervical 11/24/2018  . DDD (degenerative disc disease), lumbar 11/24/2018  . History of stroke involving cerebellum 10/13/2018  . Gait abnormality 10/13/2018  . 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  . Asthma 08/12/2018  . Hypothyroidism 08/12/2018  . Rheumatoid arthritis (HCC) 07/04/2018  . Essential hypertension 07/04/2018  . Hyperlipidemia 07/04/2018  . Hyperglycemia 05/28/2017  . Seasonal allergic rhinitis 05/28/2017  . Gouty arthropathy 09/15/2010  . Osteoarthrosis, generalized, involving multiple sites 09/15/2010  .  Esophagitis 08/30/2010   10/30/2010, PT 05/16/21 5:11 PM Phone: [phone] Fax: [phone] 05/16/2021, 5:10 PM  [facility] [address] [place], [place], [place] Phone: [phone]   Fax:  [phone]  Name: Alexis Shelton MRN: [name] Date of Birth: [birth year]

## 2021-05-18 ENCOUNTER — Ambulatory Visit: Payer: Medicare Other | Admitting: Physical Therapy

## 2021-05-18 ENCOUNTER — Other Ambulatory Visit: Payer: Self-pay

## 2021-05-18 DIAGNOSIS — R2681 Unsteadiness on feet: Secondary | ICD-10-CM | POA: Diagnosis not present

## 2021-05-18 DIAGNOSIS — R262 Difficulty in walking, not elsewhere classified: Secondary | ICD-10-CM

## 2021-05-18 DIAGNOSIS — M545 Low back pain, unspecified: Secondary | ICD-10-CM | POA: Diagnosis not present

## 2021-05-18 DIAGNOSIS — M6281 Muscle weakness (generalized): Secondary | ICD-10-CM

## 2021-05-18 NOTE — Therapy (Signed)
[facility] [address] [place], [place], [address] Phone: [phone]   Fax:  [phone]  Physical Therapy Treatment  Patient Details  Name: Alexis Shelton MRN: [id number] Date of Birth: 08/05/1941 Referring Provider (PT): Dr. Lavada Mesi   Encounter Date: 05/18/2021   PT End of Session - 05/18/21 1716    Visit Number 12    Date for PT Re-Evaluation 07/04/21    Authorization Type Medicare/Aetna    Progress Note Due on Visit 20    PT Start Time 1147    PT Stop Time 1229    PT Time Calculation (min) 42 min    Activity Tolerance Patient tolerated treatment well;No increased pain    Behavior During Therapy WFL for tasks assessed/performed           Past Medical History:  Diagnosis Date  . Arthritis   . DDD (degenerative disc disease), lumbar 11/24/2018  . Dizziness   . Gallstones   . Hearing loss   . History of stroke involving cerebellum 10/13/2018  . Hypercholesteremia   . Hypertension   . Hypothyroidism   . 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  . Migraines   . Mild intermittent asthma without complication 02/11/2019  . Primary osteoarthritis of both feet 11/24/2018  . Rheumatoid arthritis (HCC) 07/04/2018  . Stage 3 chronic kidney disease (HCC) 02/11/2019  . Vitamin D deficiency 02/11/2019    Past Surgical History:  Procedure Laterality Date  . ABDOMINAL HYSTERECTOMY    . BREAST REDUCTION SURGERY Bilateral   . CATARACT EXTRACTION, BILATERAL    . INCISION AND DRAINAGE BREAST ABSCESS Left   . REDUCTION MAMMAPLASTY    . REPLACEMENT TOTAL KNEE Bilateral   . TONSILLECTOMY AND ADENOIDECTOMY      There were no vitals filed for this visit.   Subjective Assessment - 05/18/21 1714    Subjective Did not sleep well last night.    Pertinent History decreased hearing Lt ear after abdominal sx in 1990 - may have been a small stroke that caused hearing loss; (CVA was in the cerebellum);  RA, HTN,  idiopathic gout, h/o CVA involving cerebellum, OA hands & feet, DDD cervical & lumbar, stage 3 CKD, s/p bil. TKR's, mild peripheral neuropathy; history of vertigo; tried aquatic ex but didn't like    How long can you sit comfortably? feels better    How long can you stand comfortably? hard to cook b/c of hands;  I get tired when I'm on my feet a while    How long can you walk comfortably? with shopping cart OK; without cart painful to walk to car    Diagnostic tests none for back    Patient Stated Goals get rid of pain; decreased reliance on medication; start walking program; sleep better; start an ex program--maybe at the gym    Currently in Pain? No/denies                             [facility] Adult PT Treatment/Exercise - 05/18/21 0001      Lumbar Exercises: Aerobic   UBE (Upper Arm Bike) L1, 3 min forward/back      Lumbar Exercises: Standing   Row Power tower;Both;10 reps    Row Limitations 25 lbs    Other Standing Lumbar Exercises dumbbell snatch and press    Other Standing Lumbar Exercises 2 5# weights 10x partial dead lift      Lumbar Exercises:  Seated   Sit to Stand 10 reps    Sit to Stand Limitations 5#      Knee/Hip Exercises: Standing   Forward Step Up Right;Left;1 set    Forward Step Up Limitations 7 repetitions holding 5# weight    Other Standing Knee Exercises towel slides hip extension and abduction 10x right/left    Other Standing Knee Exercises 2 foot stance Pallof with red band 5 reps each: press out, press up      Shoulder Exercises: ROM/Strengthening   Wall Pushups 10 reps                    PT Short Term Goals - 05/18/21 1715      PT SHORT TERM GOAL #4   Title Lumbar extension to 15 degrees and hip extension to 10 degrees needed for improved mobility for walking    Time 4    Period Weeks    Status On-going    Target Date 04/26/20             PT Long Term Goals - 05/18/21 1715      PT LONG TERM GOAL #1   Title The  patient will be independent in a safe self progression of a HEP and/or a gym program    Time 8    Period Weeks    Status On-going      PT LONG TERM GOAL #3   Title The patient will report a 60% improvement in back pain at night and with standing/walking    Time 8    Period Weeks    Status Achieved                 Plan - 05/18/21 1714    Clinical Impression Statement Patient reporting no increased pain throughout session. She reported increased fatigue following standing heel slide activity. Verbal cues provided for form when performing lat pull down exercise for direction of force and maintaining wrist stability. Would benefit from continued skilled intervention for improved functional mobility and activity tolerance.    Personal Factors and Comorbidities Fitness;Age;Time since onset of injury/illness/exacerbation;Comorbidity 2    Comorbidities abdominal hysterectomy with hearing loss Lt ear after surgery, RA; h/o CVA involving cerebellum, HTN, idiopathic gout, OA hands and feet, DDD cervical and lumbar, stage 3 CKD, s/p bil. TKR's, peripheral neuropathy    Examination-Activity Limitations Hygiene/Grooming;Locomotion Level;Transfers;Bed Mobility;Bend;Stand    Examination-Participation Restrictions Community Activity;Cleaning;Meal Prep;Shop;Laundry    Rehab Potential Good    PT Frequency 2x / week    PT Duration 8 weeks    PT Treatment/Interventions ADLs/Self Care Home Management;Aquatic Therapy;Cryotherapy;Electrical Stimulation;Moist Heat;Iontophoresis 4mg /ml Dexamethasone;Traction;Neuromuscular re-education;Therapeutic exercise;Therapeutic activities;Patient/family education;Manual techniques;Dry needling;Taping;Functional mobility training    PT Next Visit Plan continue functional training, core, and glute strengthening    PT Home Exercise Plan Access Code:    Consulted and Agree with Plan of Care Patient           Patient will benefit from skilled therapeutic  intervention in order to improve the following deficits and impairments:  Dizziness,Difficulty walking,Decreased balance,Impaired sensation  Visit Diagnosis: Acute bilateral low back pain, unspecified whether sciatica present  Muscle weakness (generalized)  Difficulty in walking, not elsewhere classified  Unsteadiness on feet     Problem List Patient Active Problem List   Diagnosis Date Noted  . Obesity 08/19/2020  . Proteinuria 08/19/2020  . Vitamin D deficiency 02/11/2019  . Stage 3 chronic kidney disease (HCC) 02/11/2019  . Mild intermittent asthma without complication  02/11/2019  . Primary osteoarthritis of both hands 11/24/2018  . Primary osteoarthritis of both feet 11/24/2018  . DDD (degenerative disc disease), cervical 11/24/2018  . DDD (degenerative disc disease), lumbar 11/24/2018  . History of stroke involving cerebellum 10/13/2018  . Gait abnormality 10/13/2018  . 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  . Asthma 08/12/2018  . Hypothyroidism 08/12/2018  . Rheumatoid arthritis (HCC) 07/04/2018  . Essential hypertension 07/04/2018  . Hyperlipidemia 07/04/2018  . Hyperglycemia 05/28/2017  . Seasonal allergic rhinitis 05/28/2017  . Gouty arthropathy 09/15/2010  . Osteoarthrosis, generalized, involving multiple sites 09/15/2010  . Esophagitis 08/30/2010    Anabel Halon PT, DPT  05/18/21 5:17 PM   [facility] [address] [place], [place], [address] Phone: [phone]   Fax:  [phone]  Name: Alexis Shelton MRN: [id number] Date of Birth: [birth year]

## 2021-05-22 ENCOUNTER — Other Ambulatory Visit: Payer: Self-pay | Admitting: Family Medicine

## 2021-05-24 ENCOUNTER — Ambulatory Visit: Payer: Medicare Other | Attending: Family Medicine | Admitting: Physical Therapy

## 2021-05-24 ENCOUNTER — Other Ambulatory Visit: Payer: Self-pay

## 2021-05-24 DIAGNOSIS — M545 Low back pain, unspecified: Secondary | ICD-10-CM | POA: Diagnosis not present

## 2021-05-24 DIAGNOSIS — M6281 Muscle weakness (generalized): Secondary | ICD-10-CM | POA: Diagnosis not present

## 2021-05-24 DIAGNOSIS — R262 Difficulty in walking, not elsewhere classified: Secondary | ICD-10-CM | POA: Diagnosis not present

## 2021-05-24 DIAGNOSIS — R293 Abnormal posture: Secondary | ICD-10-CM | POA: Diagnosis not present

## 2021-05-24 DIAGNOSIS — R2681 Unsteadiness on feet: Secondary | ICD-10-CM | POA: Insufficient documentation

## 2021-05-24 NOTE — Therapy (Signed)
[facility] [address], [address] [place], [place], [address] Phone: [phone]   Fax:  [phone]  Physical Therapy Treatment  Patient Details  Name: Alexis Shelton MRN: [id number] Date of Birth: 05-Jan-1941 Referring Provider (PT): Dr. Lavada Mesi   Encounter Date: 05/24/2021   PT End of Session - 05/24/21 1325    Visit Number 13    Date for PT Re-Evaluation 07/04/21    Authorization Type Medicare/Aetna    Progress Note Due on Visit 20    PT Start Time 1230    PT Stop Time 1309    PT Time Calculation (min) 39 min    Activity Tolerance Patient tolerated treatment well;Patient limited by fatigue    Behavior During Therapy [facility] for tasks assessed/performed           Past Medical History:  Diagnosis Date  . Arthritis   . DDD (degenerative disc disease), lumbar 11/24/2018  . Dizziness   . Gallstones   . Hearing loss   . History of stroke involving cerebellum 10/13/2018  . Hypercholesteremia   . Hypertension   . Hypothyroidism   . 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  . Migraines   . Mild intermittent asthma without complication 02/11/2019  . Primary osteoarthritis of both feet 11/24/2018  . Rheumatoid arthritis (HCC) 07/04/2018  . Stage 3 chronic kidney disease (HCC) 02/11/2019  . Vitamin D deficiency 02/11/2019    Past Surgical History:  Procedure Laterality Date  . ABDOMINAL HYSTERECTOMY    . BREAST REDUCTION SURGERY Bilateral   . CATARACT EXTRACTION, BILATERAL    . INCISION AND DRAINAGE BREAST ABSCESS Left   . REDUCTION MAMMAPLASTY    . REPLACEMENT TOTAL KNEE Bilateral   . TONSILLECTOMY AND ADENOIDECTOMY      There were no vitals filed for this visit.   Subjective Assessment - 05/24/21 1319    Subjective Patient reports sudden onset of fatigue last night. States that she dozed off on the couch which is unusual for her. Feels not as energetic today but thinks this is related to RA. Continues to  have increased back pain when waking up and feels that she is unable to fully stand up straight. Notes that this usually resolves around noon.    Pertinent History decreased hearing Lt ear after abdominal sx in 1990 - may have been a small stroke that caused hearing loss; (CVA was in the cerebellum);  RA, HTN, idiopathic gout, h/o CVA involving cerebellum, OA hands & feet, DDD cervical & lumbar, stage 3 CKD, s/p bil. TKR's, mild peripheral neuropathy; history of vertigo; tried aquatic ex but didn't like    How long can you sit comfortably? feels better    How long can you stand comfortably? hard to cook b/c of hands;  I get tired when I'm on my feet a while    How long can you walk comfortably? with shopping cart OK; without cart painful to walk to car    Diagnostic tests none for back    Patient Stated Goals get rid of pain; decreased reliance on medication; start walking program; sleep better; start an ex program--maybe at the gym    Currently in Pain? No/denies    Pain Onset [facility] Adult PT Treatment/Exercise - 05/24/21 0001      Knee/Hip Exercises: Standing  Forward Lunges Limitations step back lunge with UE support at power tower; x8 reps Rt/Lt    Functional Squat 15 reps    Functional Squat Limitations partial squat holding chair      Knee/Hip Exercises: Seated   Clamshell with TheraBand --   black loop x 12 repetitions   Knee/Hip Flexion 2lbs; x10 reps Rt/Lt      Shoulder Exercises: Seated   Row 12 reps;Theraband    Theraband Level (Shoulder Row) Level 4 (Blue)    Flexion Both;10 reps    Flexion Weight (lbs) 2    Abduction Both;10 reps    ABduction Weight (lbs) 2    Diagonals Both;10 reps    Diagonals Weight (lbs) 2      Shoulder Exercises: Standing   Other Standing Exercises bicep curls; 2lbs; x12 repetitions                    PT Short Term Goals - 05/18/21 1715      PT SHORT TERM GOAL #4   Title  Lumbar extension to 15 degrees and hip extension to 10 degrees needed for improved mobility for walking    Time 4    Period Weeks    Status On-going    Target Date 04/26/20             PT Long Term Goals - 05/24/21 1325      PT LONG TERM GOAL #3   Title The patient will report a 60% improvement in back pain at night and with standing/walking    Time 8    Period Weeks    Status On-going   Patient reporting continued pain this date as compared to previous sessions.                Plan - 05/24/21 1321    Clinical Impression Statement Patient appears to have increased fatigue this date. Session mostly performed in sitting or with UE support. Patient reporting feeling "whoozy" during session. BP 149/60 and patient reporting decreased symptoms. BP 154/61 at end of session in sitting. BP taken in standing and found to be 155/ 89. However, patient reported that she was no longer symptomatic. Multimodal cuing required for decreased excessive hip hinge and forward trunk flexion when performing UE supported partial squats. Would benefit from continued skilled intervention for decreased pain and improved functional activity tolerance.    Personal Factors and Comorbidities Fitness;Age;Time since onset of injury/illness/exacerbation;Comorbidity 2    Comorbidities abdominal hysterectomy with hearing loss Lt ear after surgery, RA; h/o CVA involving cerebellum, HTN, idiopathic gout, OA hands and feet, DDD cervical and lumbar, stage 3 CKD, s/p bil. TKR's, peripheral neuropathy    Examination-Activity Limitations Hygiene/Grooming;Locomotion Level;Transfers;Bed Mobility;Bend;Stand    Examination-Participation Restrictions Community Activity;Cleaning;Meal Prep;Shop;Laundry    Rehab Potential Good    PT Frequency 2x / week    PT Duration 8 weeks    PT Treatment/Interventions ADLs/Self Care Home Management;Aquatic Therapy;Cryotherapy;Electrical Stimulation;Moist Heat;Iontophoresis 4mg /ml  Dexamethasone;Traction;Neuromuscular re-education;Therapeutic exercise;Therapeutic activities;Patient/family education;Manual techniques;Dry needling;Taping;Functional mobility training    PT Next Visit Plan progress functional training, core, and glute strengthening    PT Home Exercise Plan Access Code:    Consulted and Agree with Plan of Care Patient           Patient will benefit from skilled therapeutic intervention in order to improve the following deficits and impairments:  Dizziness,Difficulty walking,Decreased balance,Impaired sensation  Visit Diagnosis: Acute bilateral low back pain, unspecified whether sciatica present  Muscle weakness (generalized)  Difficulty in walking, not elsewhere classified  Unsteadiness on feet     Problem List Patient Active Problem List   Diagnosis Date Noted  . Obesity 08/19/2020  . Proteinuria 08/19/2020  . Vitamin D deficiency 02/11/2019  . Stage 3 chronic kidney disease (HCC) 02/11/2019  . Mild intermittent asthma without complication 02/11/2019  . Primary osteoarthritis of both hands 11/24/2018  . Primary osteoarthritis of both feet 11/24/2018  . DDD (degenerative disc disease), cervical 11/24/2018  . DDD (degenerative disc disease), lumbar 11/24/2018  . History of stroke involving cerebellum 10/13/2018  . Gait abnormality 10/13/2018  . 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  . Asthma 08/12/2018  . Hypothyroidism 08/12/2018  . Rheumatoid arthritis (HCC) 07/04/2018  . Essential hypertension 07/04/2018  . Hyperlipidemia 07/04/2018  . Hyperglycemia 05/28/2017  . Seasonal allergic rhinitis 05/28/2017  . Gouty arthropathy 09/15/2010  . Osteoarthrosis, generalized, involving multiple sites 09/15/2010  . Esophagitis 08/30/2010   Anabel Halon PT, DPT  05/24/21 1:26 PM    [facility] [address] [place], [place], [address] Phone: [phone]   Fax:   [phone]  Name: Carys Neumeister MRN: [id number] Date of Birth: May 19, 1941

## 2021-05-29 ENCOUNTER — Other Ambulatory Visit (HOSPITAL_COMMUNITY)
Admission: RE | Admit: 2021-05-29 | Discharge: 2021-05-29 | Disposition: A | Discharge status: Home / Self Care | Payer: Medicare Other | Source: Ambulatory Visit | Attending: Obstetrics and Gynecology | Admitting: Obstetrics and Gynecology

## 2021-05-29 ENCOUNTER — Encounter: Payer: Self-pay | Admitting: Obstetrics and Gynecology

## 2021-05-29 ENCOUNTER — Ambulatory Visit: Payer: Medicare Other | Admitting: Physical Therapy

## 2021-05-29 ENCOUNTER — Ambulatory Visit (INDEPENDENT_AMBULATORY_CARE_PROVIDER_SITE_OTHER): Payer: Medicare Other | Admitting: Obstetrics and Gynecology

## 2021-05-29 ENCOUNTER — Other Ambulatory Visit: Payer: Self-pay

## 2021-05-29 VITALS — BP 130/78 | HR 70 | Ht 62.25 in | Wt 176.4 lb

## 2021-05-29 DIAGNOSIS — R159 Full incontinence of feces: Secondary | ICD-10-CM

## 2021-05-29 DIAGNOSIS — N898 Other specified noninflammatory disorders of vagina: Secondary | ICD-10-CM

## 2021-05-29 DIAGNOSIS — M6281 Muscle weakness (generalized): Secondary | ICD-10-CM

## 2021-05-29 DIAGNOSIS — R262 Difficulty in walking, not elsewhere classified: Secondary | ICD-10-CM | POA: Diagnosis not present

## 2021-05-29 DIAGNOSIS — R2681 Unsteadiness on feet: Secondary | ICD-10-CM | POA: Diagnosis not present

## 2021-05-29 DIAGNOSIS — M545 Low back pain, unspecified: Secondary | ICD-10-CM | POA: Diagnosis not present

## 2021-05-29 DIAGNOSIS — R293 Abnormal posture: Secondary | ICD-10-CM | POA: Diagnosis not present

## 2021-05-29 DIAGNOSIS — N9089 Other specified noninflammatory disorders of vulva and perineum: Secondary | ICD-10-CM | POA: Diagnosis not present

## 2021-05-29 MED ORDER — BETAMETHASONE VALERATE 0.1 % EX OINT: TOPICAL_OINTMENT | CUTANEOUS | 0 refills | Status: DC

## 2021-05-29 NOTE — Therapy (Signed)
[facility] [address], [place], [place], [address] Phone: [phone]   Fax:  [phone]  Physical Therapy Treatment  Patient Details  Name: Alexis Shelton MRN: [id number] Date of Birth: 01-09-1941 Referring Provider (PT): Dr. Eunice Blase   Encounter Date: 05/29/2021   PT End of Session - 05/29/21 1617    Visit Number 14    Date for PT Re-Evaluation 07/04/21    Authorization Type Medicare/Aetna    Authorization Time Period KX at visit 15    Progress Note Due on Visit 20    PT Start Time 1532    PT Stop Time 5638    PT Time Calculation (min) 45 min    Activity Tolerance Patient tolerated treatment well;No increased pain    Behavior During Therapy WFL for tasks assessed/performed           Past Medical History:  Diagnosis Date  . Arthritis   . DDD (degenerative disc disease), lumbar 11/24/2018  . Dizziness   . Gallstones   . Hearing loss   . History of stroke involving cerebellum 10/13/2018  . Hypercholesteremia   . Hypertension   . Hypothyroidism   . 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  . Migraines   . Mild intermittent asthma without complication 7/56/4332  . Primary osteoarthritis of both feet 11/24/2018  . Rheumatoid arthritis ([place]) 07/04/2018  . Stage 3 chronic kidney disease ([place]) 02/11/2019  . Vitamin D deficiency 02/11/2019    Past Surgical History:  Procedure Laterality Date  . ABDOMINAL HYSTERECTOMY    . BREAST REDUCTION SURGERY Bilateral   . CATARACT EXTRACTION, BILATERAL    . INCISION AND DRAINAGE BREAST ABSCESS Left   . REDUCTION MAMMAPLASTY    . REPLACEMENT TOTAL KNEE Bilateral   . TONSILLECTOMY AND ADENOIDECTOMY      There were no vitals filed for this visit.   Subjective Assessment - 05/29/21 1533    Subjective Found that fatigue was due to her missing a medication. Has noticed that pain when first waking up in the morning is significantly decreased since adding  that medication back into her daily regiment. Was able to do a lot of spring cleaning without increased pain.    Pertinent History decreased hearing Lt ear after abdominal sx in 1990 - may have been a small stroke that caused hearing loss; (CVA was in the cerebellum);  RA, HTN, idiopathic gout, h/o CVA involving cerebellum, OA hands & feet, DDD cervical & lumbar, stage 3 CKD, s/p bil. TKR's, mild peripheral neuropathy; history of vertigo; tried aquatic ex but didn't like    How long can you sit comfortably? feels better    How long can you stand comfortably? hard to cook b/c of hands;  I get tired when I'm on my feet a while    How long can you walk comfortably? with shopping cart OK; without cart painful to walk to car    Diagnostic tests none for back    Patient Stated Goals get rid of pain; decreased reliance on medication; start walking program; sleep better; start an ex program--maybe at the gym    Currently in Pain? No/denies                             [facility] Adult PT Treatment/Exercise - 05/29/21 0001      Lumbar Exercises: Aerobic   UBE (Upper Arm Bike) L2 x 8 minutes; PT present throughout to  assess response      Lumbar Exercises: Standing   Row Power tower;Both    Row Limitations 2 x 12 reps; 20lbs    Other Standing Lumbar Exercises dumbbel snatch and press; 2 3lbs dumbbells; x10 reps; x5 reps with mirror for visual feedback      Knee/Hip Exercises: Standing   Forward Lunges Limitations step back lunge with UE support at power tower; x10 reps Rt/Lt    Terminal Knee Extension Both;1 set;15 reps    Terminal Knee Extension Limitations ball behind knee    Functional Squat 2 sets;10 reps    Functional Squat Limitations hand hold at power tower      Shoulder Exercises: Seated   Flexion Weight (lbs) 2    Flexion Limitations 2 x 10 repetitions    ABduction Weight (lbs) 2    ABduction Limitations 2 x 10 repetitions    Diagonals Weight (lbs) 2    Diagonals  Limitations 2 x 10 repetitions                    PT Short Term Goals - 05/18/21 1715      PT SHORT TERM GOAL #4   Title Lumbar extension to 15 degrees and hip extension to 10 degrees needed for improved mobility for walking    Time 4    Period Weeks    Status On-going    Target Date 04/26/20             PT Long Term Goals - 05/29/21 1614      PT LONG TERM GOAL #1   Title The patient will be independent in a safe self progression of a HEP and/or a gym program    Time 8    Period Weeks    Status On-going      PT LONG TERM GOAL #2   Title The patient will have lumbo/pelvic/hip strength grossly 4+/5 needed for walking/standing for longer periods of time with less discomfort    Time 8    Period Weeks    Status On-going      PT LONG TERM GOAL #3   Title The patient will report a 60% improvement in back pain at night and with standing/walking    Time 8    Period Weeks    Status Partially Met   Unable to quantify; was able to perform all morning activities this date with minimal discomfort and did not have to wait until this afternoon to feel better     PT LONG TERM GOAL #4   Title FOTO functional outcome score improved to 68%    Time 8    Period Weeks    Status On-going                 Plan - 05/29/21 1612    Clinical Impression Statement Patient demonstrating impaired eccentric quad strength when performing reverse lunge activity. Requiring max verbal, tactile, and visual cues for snatch and grab activity. She exhibits need for continued core strength as patient often reverting to maintaining standing with excessive anterior pelvic tilt indicating need for core stability. Would benefit from continued skilled intervention to address impairments for decreased pain and improved activity tolerance.    Personal Factors and Comorbidities Fitness;Age;Time since onset of injury/illness/exacerbation;Comorbidity 2    Comorbidities abdominal hysterectomy with hearing  loss Lt ear after surgery, RA; h/o CVA involving cerebellum, HTN, idiopathic gout, OA hands and feet, DDD cervical and lumbar, stage 3 CKD, s/p bil. TKR's,  peripheral neuropathy    Examination-Activity Limitations Hygiene/Grooming;Locomotion Level;Transfers;Bed Mobility;Bend;Stand    Examination-Participation Restrictions Community Activity;Cleaning;Meal Prep;Shop;Laundry    Rehab Potential Good    PT Frequency 2x / week    PT Duration 8 weeks    PT Treatment/Interventions ADLs/Self Care Home Management;Aquatic Therapy;Cryotherapy;Electrical Stimulation;Moist Heat;Iontophoresis 47m/ml Dexamethasone;Traction;Neuromuscular re-education;Therapeutic exercise;Therapeutic activities;Patient/family education;Manual techniques;Dry needling;Taping;Functional mobility training    PT Next Visit Plan continue to progress functional training as well as core and glute strengthening    PT Home Exercise Plan Access Code: [email or web address]   Consulted and Agree with Plan of Care Patient           Patient will benefit from skilled therapeutic intervention in order to improve the following deficits and impairments:  Dizziness,Difficulty walking,Decreased balance,Impaired sensation  Visit Diagnosis: Acute bilateral low back pain, unspecified whether sciatica present  Muscle weakness (generalized)  Difficulty in walking, not elsewhere classified  Unsteadiness on feet     Problem List Patient Active Problem List   Diagnosis Date Noted  . Obesity 08/19/2020  . Proteinuria 08/19/2020  . Vitamin D deficiency 02/11/2019  . Stage 3 chronic kidney disease ([place] 02/11/2019  . Mild intermittent asthma without complication 077/41/4239 . Primary osteoarthritis of both hands 11/24/2018  . Primary osteoarthritis of both feet 11/24/2018  . DDD (degenerative disc disease), cervical 11/24/2018  . DDD (degenerative disc disease), lumbar 11/24/2018  . History of stroke involving cerebellum 10/13/2018  . Gait abnormality  10/13/2018  . 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  . Asthma 08/12/2018  . Hypothyroidism 08/12/2018  . Rheumatoid arthritis ([place] 07/04/2018  . Essential hypertension 07/04/2018  . Hyperlipidemia 07/04/2018  . Hyperglycemia 05/28/2017  . Seasonal allergic rhinitis 05/28/2017  . Gouty arthropathy 09/15/2010  . Osteoarthrosis, generalized, involving multiple sites 09/15/2010  . Esophagitis 08/30/2010    KEverardo AllPT, DPT  05/29/21 4:18 PM    [facility] [address] [place] [address]Phone: [phone]  Fax:  [phone] Name: GTyleah LohMRN: [id number] of Birth: 910/20/1942

## 2021-05-29 NOTE — Progress Notes (Signed)
GYNECOLOGY  VISIT   HPI: 80 y.o.   Widowed Caucasian female   [phone] with No LMP recorded. Patient has had a hysterectomy.   here for vulvar irritation/pain, which started a month ago.  Vagina has also become irritated.  No vaginal discharge.  No vaginal odor.   Also used over the counter medication to treat, and this did not work. Then took Diflucan, and this resolved her symptoms.  Not currently using anything externally.  Stopped vaginal estrogen cream after one week, so she stopped this.  Battling urinary and fecal incontinence.  Has seen urology and has done physical therapy. Wearing pads all the time.   A1C 5.7 on 03/06/21.   Son and grandson live her.  Her son is a International aid/development worker.   Not sexually active for 4 - 5 years.   No new exposures.  Using soap from [place] for a couple of years.   Hx eczema problems and using a Clobetasol ointment for that.  Has essentially run out.   Difficulty with weight loss.   GYNECOLOGIC HISTORY: No LMP recorded. Patient has had a hysterectomy. Contraception: hysterectomy Menopausal hormone therapy:   Last mammogram:  07/18/20 3D/Neg/Density B/BiRads 1 Last pap smear:  Does not remember        OB History    Gravida  4   Para  1   Term      Preterm      AB  2   Living  1     SAB  2   IAB      Ectopic      Multiple      Live Births                 Patient Active Problem List   Diagnosis Date Noted  . Obesity 08/19/2020  . Proteinuria 08/19/2020  . Vitamin D deficiency 02/11/2019  . Stage 3 chronic kidney disease (HCC) 02/11/2019  . Mild intermittent asthma without complication 02/11/2019  . Primary osteoarthritis of both hands 11/24/2018  . Primary osteoarthritis of both feet 11/24/2018  . DDD (degenerative disc disease), cervical 11/24/2018  . DDD (degenerative disc disease), lumbar 11/24/2018  . History of stroke involving cerebellum 10/13/2018  . Gait abnormality 10/13/2018  . Idiopathic gout of  multiple sites 08/12/2018  . Asthma 08/12/2018  . Hypothyroidism 08/12/2018  . Rheumatoid arthritis (HCC) 07/04/2018  . Essential hypertension 07/04/2018  . Hyperlipidemia 07/04/2018  . Hyperglycemia 05/28/2017  . Seasonal allergic rhinitis 05/28/2017  . Gouty arthropathy 09/15/2010  . Osteoarthrosis, generalized, involving multiple sites 09/15/2010  . Esophagitis 08/30/2010    Past Medical History:  Diagnosis Date  . Arthritis   . DDD (degenerative disc disease), lumbar 11/24/2018  . Dizziness   . Gallstones   . Hearing loss   . History of stroke involving cerebellum 10/13/2018  . Hypercholesteremia   . Hypertension   . Hypothyroidism   . 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  . Migraines   . Mild intermittent asthma without complication 02/11/2019  . Primary osteoarthritis of both feet 11/24/2018  . Rheumatoid arthritis (HCC) 07/04/2018  . Stage 3 chronic kidney disease (HCC) 02/11/2019  . Vitamin D deficiency 02/11/2019    Past Surgical History:  Procedure Laterality Date  . ABDOMINAL HYSTERECTOMY    . BREAST REDUCTION SURGERY Bilateral   . CATARACT EXTRACTION, BILATERAL    . INCISION AND DRAINAGE BREAST ABSCESS Left   . REDUCTION MAMMAPLASTY    . REPLACEMENT TOTAL KNEE Bilateral   . TONSILLECTOMY  AND ADENOIDECTOMY      Current Outpatient Medications  Medication Sig Dispense Refill  . albuterol (VENTOLIN HFA) 108 (90 Base) MCG/ACT inhaler Inhale 2 puffs into the lungs every 6 (six) hours as needed for wheezing or shortness of breath. (Patient taking differently: Inhale 2 puffs into the lungs as needed for wheezing or shortness of breath.) 54 g 3  . allopurinol (ZYLOPRIM) 100 MG tablet TAKE 1 TABLET BY MOUTH EVERY DAY 90 tablet 3  . aspirin 81 MG EC tablet Take 81 mg by mouth daily. Swallow whole.    Marland Kitchen atorvastatin (LIPITOR) 10 MG tablet TAKE 1 TABLET BY MOUTH EVERY DAY 90 tablet 3  . atorvastatin (LIPITOR) 20 MG tablet Take 1 tablet (20 mg total) by mouth daily.  90 tablet 3  . budesonide-formoterol (SYMBICORT) 160-4.5 MCG/ACT inhaler Inhale 2 puffs into the lungs as needed. 30.6 g 3  . ciclopirox (PENLAC) 8 % solution Apply topically at bedtime. Apply over nail and surrounding skin. Apply daily over previous coat. After seven (7) days, may remove with alcohol and continue cycle. 6.6 mL 4  . Clobetasol Propionate 0.05 % shampoo Apply 1 application topically 2 (two) times daily. 118 mL 6  . Cyanocobalamin ([employer] VITAMIN B-12) 5000 MCG SUBL Place under the tongue daily.    . diclofenac Sodium (VOLTAREN) 1 % GEL Apply 4 g topically 4 (four) times daily as needed. 500 g 6  . furosemide (LASIX) 20 MG tablet TAKE 1 TABLET BY MOUTH EVERY DAY 90 tablet 1  . ketoconazole (NIZORAL) 2 % shampoo Apply 1 application topically 2 (two) times a week. 120 mL 11  . levothyroxine (SYNTHROID) 100 MCG tablet TAKE 1 TABLET BY MOUTH DAILY BEFORE BREAKFAST. 90 tablet 1  . metoprolol tartrate (LOPRESSOR) 25 MG tablet TAKE 1 TABLET BY MOUTH TWICE A DAY 180 tablet 2  . montelukast (SINGULAIR) 10 MG tablet TAKE 1 TABLET BY MOUTH EVERYDAY AT BEDTIME 90 tablet 3  . Multiple Vitamin (MULTIVITAMIN) tablet Take 1 tablet by mouth daily.    Marland Kitchen omeprazole (PRILOSEC) 40 MG capsule Take 1 capsule (40 mg total) by mouth daily before breakfast. 30 mins before 90 capsule 3  . traMADol (ULTRAM) 50 MG tablet TAKE 1 TABLET BY MOUTH EVERY 6 HOURS AS NEEDED 90 tablet 0  . clobetasol ointment (TEMOVATE) 0.05 % Apply 1 a small amount to affected area twice a day  Apply to legs as needed for itching (Patient not taking: Reported on 05/29/2021)    . estradiol (ESTRACE) 0.1 MG/GM vaginal cream 1 applicator PV qd x 1 week, then once weekly after that. 42.5 g 12   No current facility-administered medications for this visit.     ALLERGIES: Other  Family History  Problem Relation Age of Onset  . Stroke Mother   . Hyperlipidemia Mother   . Arthritis Mother   . Heart disease Father   . Arthritis Sister   .  Bipolar disorder Son   . Healthy Son   . Gallbladder disease Maternal Grandmother        radiation  . GER disease Maternal Grandmother   . Heart disease Paternal Uncle        x 7  . Colon cancer Neg Hx   . Esophageal cancer Neg Hx   . Rectal cancer Neg Hx   . Stomach cancer Neg Hx   . Cancer Neg Hx     Social History   Socioeconomic History  . Marital status: Widowed    Spouse name:  Not on file  . Number of children: 1  . Years of education: MD  . Highest education level: Not on file  Occupational History  . Occupation: Retired - Sales promotion account executive    Comment: [employer]  Tobacco Use  . Smoking status: Former Smoker    Types: Cigarettes    Quit date: 1970    Years since quitting: 52.4  . Smokeless tobacco: Never Used  Vaping Use  . Vaping Use: Never used  Substance and Sexual Activity  . Alcohol use: Not Currently  . Drug use: Never  . Sexual activity: Not Currently  Other Topics Concern  . Not on file  Social History Narrative   Lives alone.  The patient is widowed.   She worked at the [employer] in [place], MD scientist, and then moved to [place] and moved to [place] where her son is a International aid/development worker, after her husband died   Right-handed.   1 cup coffee per day.   No alcohol   1 son as above   Former smoker   Investment banker, operational: Not on file  Food Insecurity: Not on file  Transportation Needs: Not on file  Physical Activity: Not on file  Stress: Not on file  Social Connections: Not on file  Intimate Partner Violence: Not on file    Review of Systems  Constitutional: Negative.   HENT: Negative.   Eyes: Negative.   Respiratory: Negative.   Cardiovascular: Negative.   Gastrointestinal: Negative.   Endocrine: Negative.   Genitourinary: Negative.   Musculoskeletal: Negative.   Skin: Negative.   Allergic/Immunologic: Negative.   Neurological: Negative.   Hematological: Negative.   Psychiatric/Behavioral: Negative.      PHYSICAL EXAMINATION:    BP 130/78 (BP Location: Left Arm, Patient Position: Sitting, Cuff Size: Large)   Pulse 70   Ht 5' 2.25" (1.581 m)   Wt 176 lb 6.4 oz (80 kg)   SpO2 97%   BMI 32.01 kg/m     General appearance: alert, cooperative and appears stated age Head: Normocephalic, without obvious abnormality, atraumatic Lungs: clear to auscultation bilaterally Heart: regular rate and rhythm Abdomen: soft, non-tender, no masses,  no organomegaly No abnormal inguinal nodes palpated  Pelvic: External genitalia:  Hypopigmentation of bilateral labia minora.  Labia minora and majora are fused on the ipsilateral sides.               Urethra:  normal appearing urethra with no masses, tenderness or lesions              Bartholins and Skenes: normal                 Vagina: normal appearing vagina with normal color and yellow frothy discharge, no lesions              Cervix: absent                Bimanual Exam:  Uterus:  absent              Adnexa: no mass, fullness, tenderness              Rectal exam: Yes.   Fecal soiling noted.               Anus:  Decreased sphincter tone, no lesions  Chaperone was present for exam.  ASSESSMENT  Status post TAH/BSO age 51  - fibroids.  Vaginal discharge.  Vulvar lesion.  Fecal incontinence.  Urinary incontinence.  PLAN  We discussed possible lichen sclerosus as a diagnosis. Nuswab.   Valisone ointment.  Instructed in use. Start Metamucil.  [place] wipes. Fu in 4 weeks for a recheck.   Possible vulvar biopsy.

## 2021-05-29 NOTE — Patient Instructions (Signed)
[name],   It is possible that you have a condition of the vulvar called lichen sclerosus.  This can cause itching, burning, and skin breakdown.  It is treated with steroids periodically to control symptoms.   It was a pleasure to meet you today! I will see you back in about 1 month for a recheck appointment.  Conley Simmonds, MD   Lichen Sclerosus Lichen sclerosus is a skin problem. It can happen on any part of the body. It happens most often in the areas around the anus or the genitals. It can cause itching and discomfort. Treatment can help to control symptoms. It can also help prevent scarring that may lead to other problems. What are the causes? The cause of this condition is not known. It is not passed from one person to another. What increases the risk?  Being a woman who has reached menopause.  Being a man who was not circumcised.  Being a child who is about to reach puberty. What are the signs or symptoms? Symptoms of this condition include:  Thin, wrinkled, white areas on the skin.  Thickened white areas on the skin.  Red and swollen patches on the skin.  Tears or cracks in the skin.  Bruising.  Blood blisters.  Very bad itching.  Pain, itching, or burning when peeing (urinating). Children are also most likely to have trouble pooping (constipation). Some adults too can have trouble pooping.   How is this treated? This condition may be treated with:  Creams or ointments (topical steroids) that are put on the skin in the affected areas. This is the most common treatment.  Medicines that are taken by mouth.  Topical immunotherapy. This is putting creams and ointments on the affected area. The creams and ointments will make your immunity strong in order to fight the infection. This is needed if steroids have not helped.  Surgery. This is only needed if the condition is very bad and is causing problems such as scarring. Follow these instructions at  home: Medicines  Take over-the-counter and prescription medicines only as told by your health care provider.  Use creams or ointments as told by your doctor. Skin care  Do not scratch the affected areas of skin.  If you are a woman, keep the vagina as clean and dry as you can.  Clean the affected area of skin gently with water only. Avoid using rough towels or toilet paper.  Avoid products that irritate the skin. These include soap and scented lotions. Your doctor will tell you what creams to use to treat itching. General instructions  Keep all follow-up visits.  You may need to take these actions to prevent or treat trouble pooping: ? Drink enough fluid to keep your pee (urine) pale yellow. ? Take over-the-counter or prescription medicines. ? Eat foods that are high in fiber. These include beans, whole grains, and fresh fruits and vegetables. ? Limit foods that are high in fat and sugar. These include fried or sweet foods. Contact a doctor if:  Your redness, swelling, or pain gets worse.  You have fluid, blood, or pus coming from the area.  You have new red and swollen patches on your skin.  You have a fever.  You have pain during sex. Get help right away if:  You have very bad pain or burning in the affected areas, especially in the area around your vagina, penis, or anus. Summary  Lichen sclerosus is a skin problem. It can cause itching and  discomfort.  This condition is usually treated with creams or ointments that are put on the skin in the affected areas.  Use medicines only as told by your doctor.  Do not scratch the affected areas of skin.  Keep all follow-up visits. This information is not intended to replace advice given to you by your health care provider. Make sure you discuss any questions you have with your health care provider. Document Revised: 04/23/2020 Document Reviewed: 04/23/2020 Elsevier Patient Education  2021 [employer].

## 2021-05-30 ENCOUNTER — Other Ambulatory Visit: Payer: Self-pay | Admitting: Family Medicine

## 2021-05-31 ENCOUNTER — Ambulatory Visit: Payer: Medicare Other | Admitting: Physical Therapy

## 2021-05-31 ENCOUNTER — Other Ambulatory Visit: Payer: Self-pay

## 2021-05-31 DIAGNOSIS — M6281 Muscle weakness (generalized): Secondary | ICD-10-CM

## 2021-05-31 DIAGNOSIS — R262 Difficulty in walking, not elsewhere classified: Secondary | ICD-10-CM

## 2021-05-31 DIAGNOSIS — R293 Abnormal posture: Secondary | ICD-10-CM | POA: Diagnosis not present

## 2021-05-31 DIAGNOSIS — M545 Low back pain, unspecified: Secondary | ICD-10-CM | POA: Diagnosis not present

## 2021-05-31 DIAGNOSIS — R2681 Unsteadiness on feet: Secondary | ICD-10-CM | POA: Diagnosis not present

## 2021-05-31 LAB — CERVICOVAGINAL ANCILLARY ONLY
Bacterial Vaginitis (gardnerella): NEGATIVE
Candida Glabrata: NEGATIVE
Candida Vaginitis: NEGATIVE
Comment: NEGATIVE
Comment: NEGATIVE
Comment: NEGATIVE
Comment: NEGATIVE
Trichomonas: NEGATIVE

## 2021-05-31 NOTE — Therapy (Signed)
[facility] [address], [address] [place], [place], [address] Phone: [phone]   Fax:  [phone]  Physical Therapy Treatment  Patient Details  Name: Alexis Shelton MRN: [id number] Date of Birth: November 21, 1941 Referring Provider (PT): Dr. Lavada Mesi   Encounter Date: 05/31/2021   PT End of Session - 05/31/21 1615    Visit Number 15    Date for PT Re-Evaluation 07/04/21    Authorization Type Medicare/Aetna    Authorization Time Period KX at visit 15    Progress Note Due on Visit 20    PT Start Time 1533    PT Stop Time 1613    PT Time Calculation (min) 40 min    Activity Tolerance Patient tolerated treatment well;No increased pain    Behavior During Therapy WFL for tasks assessed/performed           Past Medical History:  Diagnosis Date  . Arthritis   . DDD (degenerative disc disease), lumbar 11/24/2018  . Dizziness   . Gallstones   . Hearing loss   . History of stroke involving cerebellum 10/13/2018  . Hypercholesteremia   . Hypertension   . Hypothyroidism   . 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  . Migraines   . Mild intermittent asthma without complication 02/11/2019  . Primary osteoarthritis of both feet 11/24/2018  . Rheumatoid arthritis (HCC) 07/04/2018  . Stage 3 chronic kidney disease (HCC) 02/11/2019  . Vitamin D deficiency 02/11/2019    Past Surgical History:  Procedure Laterality Date  . ABDOMINAL HYSTERECTOMY    . BREAST REDUCTION SURGERY Bilateral   . CATARACT EXTRACTION, BILATERAL    . INCISION AND DRAINAGE BREAST ABSCESS Left   . REDUCTION MAMMAPLASTY    . REPLACEMENT TOTAL KNEE Bilateral   . TONSILLECTOMY AND ADENOIDECTOMY      There were no vitals filed for this visit.   Subjective Assessment - 05/31/21 1621    Subjective Feels good today. Had increased pain this morning but this resolved quickly as she began her day.    Pertinent History decreased hearing Lt ear after  abdominal sx in 1990 - may have been a small stroke that caused hearing loss; (CVA was in the cerebellum);  RA, HTN, idiopathic gout, h/o CVA involving cerebellum, OA hands & feet, DDD cervical & lumbar, stage 3 CKD, s/p bil. TKR's, mild peripheral neuropathy; history of vertigo; tried aquatic ex but didn't like    How long can you sit comfortably? feels better    How long can you stand comfortably? hard to cook b/c of hands;  I get tired when I'm on my feet a while    How long can you walk comfortably? with shopping cart OK; without cart painful to walk to car    Diagnostic tests none for back    Patient Stated Goals get rid of pain; decreased reliance on medication; start walking program; sleep better; start an ex program--maybe at the gym    Currently in Pain? No/denies                             [facility] Adult PT Treatment/Exercise - 05/31/21 0001      Lumbar Exercises: Aerobic   UBE (Upper Arm Bike) L2 x 10 minutes; PT present to discuss progress and assess response to activity      Lumbar Exercises: Standing   Row Both;15 reps    Row Limitations staggered stance; chair support;  5lb dumbbell    Other Standing Lumbar Exercises 2 5# weights 2 x10 partial dead lift      Lumbar Exercises: Seated   Other Seated Lumbar Exercises seated on BOSU chops; 3#; x10 Rt/Lt      Knee/Hip Exercises: Standing   Forward Step Up Both;1 set;10 reps;Hand Hold: 1    Forward Step Up Limitations holding 5lb weight                  PT Education - 05/31/21 1622    Education Details standing hip abduction, standing hip extension    Person(s) Educated Patient    Methods Explanation;Demonstration    Comprehension Verbalized understanding;Verbal cues required            PT Short Term Goals - 05/31/21 1613      PT SHORT TERM GOAL #1   Title independent with initial HEP    Time 4    Period Weeks    Status Achieved    Target Date 04/11/21      PT SHORT TERM GOAL #2   Title  The patient will report a 30% improvement in back pain at night, with standing and walking    Baseline leakage and not always aware of leakage, if more leakage not aware of it    Time 4    Period Weeks    Status Achieved    Target Date 04/26/20      PT SHORT TERM GOAL #3   Title The patient will have improved FOTO score to 60%    Baseline 56%    Time 4    Period Weeks    Status On-going    Target Date 04/26/20      PT SHORT TERM GOAL #4   Title Lumbar extension to 15 degrees and hip extension to 10 degrees needed for improved mobility for walking    Time 4    Period Weeks    Status Achieved   17 degrees   Target Date 04/26/20             PT Long Term Goals - 05/31/21 1614      PT LONG TERM GOAL #2   Title The patient will have lumbo/pelvic/hip strength grossly 4+/5 needed for walking/standing for longer periods of time with less discomfort    Time 8    Period Weeks    Status On-going   Lt hip ext and abduction 4-/5; Rt hip ext 4+/5; Rt hip abduction 4/5                Plan - 05/31/21 1618    Clinical Impression Statement Patient demonstrates improved lumbar extension AROM. Continues to display bil LE strength impairments as Lt hip extension and abduction 4-/5 and Rt hip abduction 4/5. Requiring intermittent CGA throughout session due to bouts of postural instability. Would benefit from continued skilled intervention to address impairments for decreased pain and improved functional mobility.    Personal Factors and Comorbidities Fitness;Age;Time since onset of injury/illness/exacerbation;Comorbidity 2    Comorbidities abdominal hysterectomy with hearing loss Lt ear after surgery, RA; h/o CVA involving cerebellum, HTN, idiopathic gout, OA hands and feet, DDD cervical and lumbar, stage 3 CKD, s/p bil. TKR's, peripheral neuropathy    Examination-Activity Limitations Hygiene/Grooming;Locomotion Level;Transfers;Bed Mobility;Bend;Stand    Examination-Participation  Restrictions Community Activity;Cleaning;Meal Prep;Shop;Laundry    Rehab Potential Good    PT Frequency 2x / week    PT Duration 8 weeks    PT Treatment/Interventions ADLs/Self Care Home  Management;Aquatic Therapy;Cryotherapy;Electrical Stimulation;Moist Heat;Iontophoresis 4mg /ml Dexamethasone;Traction;Neuromuscular re-education;Therapeutic exercise;Therapeutic activities;Patient/family education;Manual techniques;Dry needling;Taping;Functional mobility training    PT Next Visit Plan continue to progress functional training as well as core and glute strengthening    PT Home Exercise Plan Access Code: [email or web address]    Consulted and Agree with Plan of Care Patient           Patient will benefit from skilled therapeutic intervention in order to improve the following deficits and impairments:  Dizziness,Difficulty walking,Decreased balance,Impaired sensation  Visit Diagnosis: Acute bilateral low back pain, unspecified whether sciatica present  Muscle weakness (generalized)  Difficulty in walking, not elsewhere classified  Unsteadiness on feet  Abnormal posture     Problem List Patient Active Problem List   Diagnosis Date Noted  . Obesity 08/19/2020  . Proteinuria 08/19/2020  . Vitamin D deficiency 02/11/2019  . Stage 3 chronic kidney disease (HCC) 02/11/2019  . Mild intermittent asthma without complication 02/11/2019  . Primary osteoarthritis of both hands 11/24/2018  . Primary osteoarthritis of both feet 11/24/2018  . DDD (degenerative disc disease), cervical 11/24/2018  . DDD (degenerative disc disease), lumbar 11/24/2018  . History of stroke involving cerebellum 10/13/2018  . Gait abnormality 10/13/2018  . 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  . Asthma 08/12/2018  . Hypothyroidism 08/12/2018  . Rheumatoid arthritis (HCC) 07/04/2018  . Essential hypertension 07/04/2018  . Hyperlipidemia 07/04/2018  . Hyperglycemia 05/28/2017  . Seasonal allergic rhinitis 05/28/2017   . Gouty arthropathy 09/15/2010  . Osteoarthrosis, generalized, involving multiple sites 09/15/2010  . Esophagitis 08/30/2010   Anabel Halon PT, DPT  05/31/21 4:31 PM    [facility] [address], [address] [place], [place], [address] Phone: [phone]   Fax:  [phone]  Name: Alexis Shelton MRN: [id number] Date of Birth: [birth year]

## 2021-05-31 NOTE — Patient Instructions (Signed)
Standing Hip Abduction with Counter Support - 1 x daily - 7 x weekly - 3 sets - 10 reps Standing Hip Extension with Counter Support - 1 x daily - 7 x weekly - 3 sets - 10 reps

## 2021-06-06 ENCOUNTER — Other Ambulatory Visit: Payer: Self-pay

## 2021-06-06 ENCOUNTER — Ambulatory Visit: Payer: Medicare Other | Admitting: Physical Therapy

## 2021-06-06 DIAGNOSIS — R262 Difficulty in walking, not elsewhere classified: Secondary | ICD-10-CM | POA: Diagnosis not present

## 2021-06-06 DIAGNOSIS — M6281 Muscle weakness (generalized): Secondary | ICD-10-CM

## 2021-06-06 DIAGNOSIS — R2681 Unsteadiness on feet: Secondary | ICD-10-CM

## 2021-06-06 DIAGNOSIS — R293 Abnormal posture: Secondary | ICD-10-CM

## 2021-06-06 DIAGNOSIS — M545 Low back pain, unspecified: Secondary | ICD-10-CM

## 2021-06-06 NOTE — Therapy (Signed)
[facility] [address], [address] [place], [place], [address] Phone: [phone]   Fax:  [phone]  Physical Therapy Treatment  Patient Details  Name: Alexis Shelton MRN: [id number] Date of Birth: 1941-07-01 Referring Provider (PT): Dr. Lavada Mesi   Encounter Date: 06/06/2021   PT End of Session - 06/06/21 1738     Visit Number 16    Date for PT Re-Evaluation 07/04/21    Authorization Type Medicare/[employer]etna    Authorization Time Period KX at visit 15    Progress Note Due on Visit 20    PT Start Time 1532    PT Stop Time 1610    PT Time Calculation (min) 38 min    Activity Tolerance Patient tolerated treatment well;No increased pain    Behavior During Therapy [facility] for tasks assessed/performed             Past Medical History:  Diagnosis Date   Arthritis    DDD (degenerative disc disease), lumbar 11/24/2018   Dizziness    Gallstones    Hearing loss    History of stroke involving cerebellum 10/13/2018   Hypercholesteremia    Hypertension    Hypothyroidism    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   Migraines    Mild intermittent asthma without complication 02/11/2019   Primary osteoarthritis of both feet 11/24/2018   Rheumatoid arthritis (HCC) 07/04/2018   Stage 3 chronic kidney disease (HCC) 02/11/2019   Vitamin D deficiency 02/11/2019    Past Surgical History:  Procedure Laterality Date   ABDOMINAL HYSTERECTOMY     BREAST REDUCTION SURGERY Bilateral    CATARACT EXTRACTION, BILATERAL     INCISION AND DRAINAGE BREAST ABSCESS Left    REDUCTION MAMMAPLASTY     REPLACEMENT TOTAL KNEE Bilateral    TONSILLECTOMY AND ADENOIDECTOMY      There were no vitals filed for this visit.   Subjective Assessment - 06/06/21 1734     Subjective Was able to start her day without being significantly impaired by pain. Feels 40% improved.    Pertinent History decreased hearing Lt ear after abdominal sx in 1990 - may  have been a small stroke that caused hearing loss; (CVA was in the cerebellum);  RA, HTN, idiopathic gout, h/o CVA involving cerebellum, OA hands & feet, DDD cervical & lumbar, stage 3 CKD, s/p bil. TKR's, mild peripheral neuropathy; history of vertigo; tried aquatic ex but didn't like    How long can you sit comfortably? feels better    How long can you stand comfortably? hard to cook b/c of hands;  I get tired when I'm on my feet a while    How long can you walk comfortably? with shopping cart OK; without cart painful to walk to car    Diagnostic tests none for back    Patient Stated Goals get rid of pain; decreased reliance on medication; start walking program; sleep better; start an ex program--maybe at the gym    Currently in Pain? Yes    Pain Score 3     Pain Location Back    Pain Orientation Left;Lower    Pain Descriptors / Indicators Discomfort    Pain Type Acute pain    Pain Onset Today    Pain Frequency Intermittent                               [facility] Adult PT Treatment/Exercise - 06/06/21 0001  Lumbar Exercises: Stretches   Single Knee to Chest Stretch Right;Left;1 rep;20 seconds    Piriformis Stretch Right;Left;1 rep;20 seconds      Lumbar Exercises: Aerobic   UBE (Upper Arm Bike) L2 x 10 minutes; PT present to discuss progress and assess response to activity      Lumbar Exercises: Standing   Row Both;15 reps    Row Limitations in mini squat; 25#    Shoulder Extension Power Tower;Both;10 reps    Shoulder Extension Limitations 15#; extension into flexion      Lumbar Exercises: Seated   Other Seated Lumbar Exercises seated on BOSU chops; 2#; x10 Rt/Lt    Other Seated Lumbar Exercises seated on BOSU; shoulder flexion, 2# x10; shoulder abduction, 2# x10      Lumbar Exercises: Sidelying   Clam Right;Left;10 reps    Clam Limitations blue loop      Shoulder Exercises: ROM/Strengthening   Lat Pull 15 reps    Lat Pull Limitations 20#                       PT Short Term Goals - 05/31/21 1613       PT SHORT TERM GOAL #1   Title independent with initial HEP    Time 4    Period Weeks    Status Achieved    Target Date 04/11/21      PT SHORT TERM GOAL #2   Title The patient will report a 30% improvement in back pain at night, with standing and walking    Baseline leakage and not always aware of leakage, if more leakage not aware of it    Time 4    Period Weeks    Status Achieved    Target Date 04/26/20      PT SHORT TERM GOAL #3   Title The patient will have improved FOTO score to 60%    Baseline 56%    Time 4    Period Weeks    Status On-going    Target Date 04/26/20      PT SHORT TERM GOAL #4   Title Lumbar extension to 15 degrees and hip extension to 10 degrees needed for improved mobility for walking    Time 4    Period Weeks    Status Achieved   17 degrees   Target Date 04/26/20               PT Long Term Goals - 06/06/21 1737       PT LONG TERM GOAL #1   Title The patient will be independent in a safe self progression of a HEP and/or a gym program    Time 8    Period Weeks    Status On-going      PT LONG TERM GOAL #2   Title The patient will have lumbo/pelvic/hip strength grossly 4+/5 needed for walking/standing for longer periods of time with less discomfort    Time 8    Period Weeks    Status On-going      PT LONG TERM GOAL #3   Title The patient will report a 60% improvement in back pain at night and with standing/walking    Time 8    Period Weeks    Status On-going   40% improved this date     PT LONG TERM GOAL #4   Title FOTO functional outcome score improved to 68%    Time 8    Period Weeks  Status On-going                   Plan - 06/06/21 1735     Clinical Impression Statement Patient reporting resolution of Lt sided back pain at start of session. Continues to require verbal cues for form when performing lat pull down and row exercise. Tactile cues  for decreased pelvic rotation when performing clamshell exercise. Would benefit from continued skilled intervention to address impairments for decreased pain and improved activity tolerance.    Personal Factors and Comorbidities Fitness;Age;Time since onset of injury/illness/exacerbation;Comorbidity 2    Comorbidities abdominal hysterectomy with hearing loss Lt ear after surgery, RA; h/o CVA involving cerebellum, HTN, idiopathic gout, OA hands and feet, DDD cervical and lumbar, stage 3 CKD, s/p bil. TKR's, peripheral neuropathy    Examination-Activity Limitations Hygiene/Grooming;Locomotion Level;Transfers;Bed Mobility;Bend;Stand    Examination-Participation Restrictions Community Activity;Cleaning;Meal Prep;Shop;Laundry    Rehab Potential Good    PT Frequency 2x / week    PT Duration 8 weeks    PT Treatment/Interventions ADLs/Self Care Home Management;Aquatic Therapy;Cryotherapy;Electrical Stimulation;Moist Heat;Iontophoresis 4mg /ml Dexamethasone;Traction;Neuromuscular re-education;Therapeutic exercise;Therapeutic activities;Patient/family education;Manual techniques;Dry needling;Taping;Functional mobility training    PT Next Visit Plan focus on global strengthening with focus on core and glute strengthening    PT Home Exercise Plan Access Code:    Consulted and Agree with Plan of Care Patient             Patient will benefit from skilled therapeutic intervention in order to improve the following deficits and impairments:  Dizziness, Difficulty walking, Decreased balance, Impaired sensation  Visit Diagnosis: Acute bilateral low back pain, unspecified whether sciatica present  Muscle weakness (generalized)  Difficulty in walking, not elsewhere classified  Unsteadiness on feet  Abnormal posture     Problem List Patient Active Problem List   Diagnosis Date Noted   Obesity 08/19/2020   Proteinuria 08/19/2020   Vitamin D deficiency 02/11/2019   Stage 3 chronic kidney  disease (HCC) 02/11/2019   Mild intermittent asthma without complication 02/11/2019   Primary osteoarthritis of both hands 11/24/2018   Primary osteoarthritis of both feet 11/24/2018   DDD (degenerative disc disease), cervical 11/24/2018   DDD (degenerative disc disease), lumbar 11/24/2018   History of stroke involving cerebellum 10/13/2018   Gait abnormality 10/13/2018   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   Asthma 08/12/2018   Hypothyroidism 08/12/2018   Rheumatoid arthritis (HCC) 07/04/2018   Essential hypertension 07/04/2018   Hyperlipidemia 07/04/2018   Hyperglycemia 05/28/2017   Seasonal allergic rhinitis 05/28/2017   Gouty arthropathy 09/15/2010   Osteoarthrosis, generalized, involving multiple sites 09/15/2010   Esophagitis 08/30/2010   10/30/2010 PT, DPT  06/06/21 5:42 PM    [facility] [address], [address] [place], [place], [place] Phone: [phone]   Fax:  [phone]  Name: Alexis Shelton MRN: [name] Date of Birth: [birth year]

## 2021-06-07 ENCOUNTER — Other Ambulatory Visit: Payer: Self-pay | Admitting: Obstetrics and Gynecology

## 2021-06-07 DIAGNOSIS — Z1231 Encounter for screening mammogram for malignant neoplasm of breast: Secondary | ICD-10-CM

## 2021-06-08 ENCOUNTER — Other Ambulatory Visit: Payer: Self-pay

## 2021-06-08 ENCOUNTER — Ambulatory Visit: Payer: Medicare Other | Admitting: Physical Therapy

## 2021-06-08 DIAGNOSIS — M6281 Muscle weakness (generalized): Secondary | ICD-10-CM | POA: Diagnosis not present

## 2021-06-08 DIAGNOSIS — M545 Low back pain, unspecified: Secondary | ICD-10-CM | POA: Diagnosis not present

## 2021-06-08 DIAGNOSIS — R293 Abnormal posture: Secondary | ICD-10-CM | POA: Diagnosis not present

## 2021-06-08 DIAGNOSIS — R262 Difficulty in walking, not elsewhere classified: Secondary | ICD-10-CM

## 2021-06-08 DIAGNOSIS — R2681 Unsteadiness on feet: Secondary | ICD-10-CM | POA: Diagnosis not present

## 2021-06-08 NOTE — Therapy (Signed)
[facility] [address] [place], [place], [address] Phone: [phone]   Fax:  [phone]  Physical Therapy Treatment  Patient Details  Name: Alexis Shelton MRN: [id number] Date of Birth: 31-Aug-1941 Referring Provider (PT): Dr. Lavada Mesi   Encounter Date: 06/08/2021   PT End of Session - 06/08/21 1704     Visit Number 17    Date for PT Re-Evaluation 07/04/21    Authorization Type Medicare/Aetna    Authorization Time Period KX at visit 15    Progress Note Due on Visit 20    PT Start Time 1535    PT Stop Time 1614    PT Time Calculation (min) 39 min    Activity Tolerance Patient tolerated treatment well;No increased pain    Behavior During Therapy [facility] for tasks assessed/performed             Past Medical History:  Diagnosis Date   Arthritis    DDD (degenerative disc disease), lumbar 11/24/2018   Dizziness    Gallstones    Hearing loss    History of stroke involving cerebellum 10/13/2018   Hypercholesteremia    Hypertension    Hypothyroidism    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   Migraines    Mild intermittent asthma without complication 02/11/2019   Primary osteoarthritis of both feet 11/24/2018   Rheumatoid arthritis (HCC) 07/04/2018   Stage 3 chronic kidney disease (HCC) 02/11/2019   Vitamin D deficiency 02/11/2019    Past Surgical History:  Procedure Laterality Date   ABDOMINAL HYSTERECTOMY     BREAST REDUCTION SURGERY Bilateral    CATARACT EXTRACTION, BILATERAL     INCISION AND DRAINAGE BREAST ABSCESS Left    REDUCTION MAMMAPLASTY     REPLACEMENT TOTAL KNEE Bilateral    TONSILLECTOMY AND ADENOIDECTOMY      There were no vitals filed for this visit.   Subjective Assessment - 06/08/21 1544     Subjective Had increased neck pain due to sleeping poorly. Requesting some gentle cervical exercises to manage independently at home. States pain has since resolved and overall feels  good today.    Pertinent History decreased hearing Lt ear after abdominal sx in 1990 - may have been a small stroke that caused hearing loss; (CVA was in the cerebellum);  RA, HTN, idiopathic gout, h/o CVA involving cerebellum, OA hands & feet, DDD cervical & lumbar, stage 3 CKD, s/p bil. TKR's, mild peripheral neuropathy; history of vertigo; tried aquatic ex but didn't like    How long can you sit comfortably? feels better    How long can you stand comfortably? hard to cook b/c of hands;  I get tired when I'm on my feet a while    How long can you walk comfortably? with shopping cart OK; without cart painful to walk to car    Diagnostic tests none for back    Patient Stated Goals get rid of pain; decreased reliance on medication; start walking program; sleep better; start an ex program--maybe at the gym    Currently in Pain? No/denies                               [facility] Adult PT Treatment/Exercise - 06/08/21 0001       Lumbar Exercises: Aerobic   UBE (Upper Arm Bike) L2 x 10 minutes; PT present to discuss progress and assess response to activity  Lumbar Exercises: Standing   Row Both;15 reps    Row Limitations in mini squat; 25#    Other Standing Lumbar Exercises anti rotation press at power tower x10, 20#; static hold with up/down pulses x10    Other Standing Lumbar Exercises dead lift 10# kb to stool; x10 reps      Lumbar Exercises: Seated   Other Seated Lumbar Exercises seated BOSU horizontal abduction, red tband x10    Other Seated Lumbar Exercises seated BOSU D1 extension, red tband, x10 Lt/Rt      Shoulder Exercises: Standing   Other Standing Exercises staggered stance power tower chest press single UE; x10 Lt/Rt 15#                      PT Short Term Goals - 05/31/21 1613       PT SHORT TERM GOAL #1   Title independent with initial HEP    Time 4    Period Weeks    Status Achieved    Target Date 04/11/21      PT SHORT TERM GOAL #2    Title The patient will report a 30% improvement in back pain at night, with standing and walking    Baseline leakage and not always aware of leakage, if more leakage not aware of it    Time 4    Period Weeks    Status Achieved    Target Date 04/26/20      PT SHORT TERM GOAL #3   Title The patient will have improved FOTO score to 60%    Baseline 56%    Time 4    Period Weeks    Status On-going    Target Date 04/26/20      PT SHORT TERM GOAL #4   Title Lumbar extension to 15 degrees and hip extension to 10 degrees needed for improved mobility for walking    Time 4    Period Weeks    Status Achieved   17 degrees   Target Date 04/26/20               PT Long Term Goals - 06/08/21 1703       PT LONG TERM GOAL #1   Title The patient will be independent in a safe self progression of a HEP and/or a gym program    Time 8    Period Weeks    Status On-going      PT LONG TERM GOAL #2   Title The patient will have lumbo/pelvic/hip strength grossly 4+/5 needed for walking/standing for longer periods of time with less discomfort    Time 8    Period Weeks    Status On-going                   Plan - 06/08/21 1623     Clinical Impression Statement Patient requiring tactile cues for core engagement when performing seated BOSU exercises due to excessive anterior pelvic tilt. Additional verbal cues for eccentric control when performing row exercise. Demonstrates continued instability with standing activities indicating need for further glute/hip strengthening. Would benefit from continued skilled intervention for improved functional mobility and activity tolerance.    Personal Factors and Comorbidities Fitness;Age;Time since onset of injury/illness/exacerbation;Comorbidity 2    Comorbidities abdominal hysterectomy with hearing loss Lt ear after surgery, RA; h/o CVA involving cerebellum, HTN, idiopathic gout, OA hands and feet, DDD cervical and lumbar, stage 3 CKD, s/p bil.  TKR's, peripheral neuropathy  Examination-Activity Limitations Hygiene/Grooming;Locomotion Level;Transfers;Bed Mobility;Bend;Stand    Examination-Participation Restrictions Community Activity;Cleaning;Meal Prep;Shop;Laundry    Rehab Potential Good    PT Frequency 2x / week    PT Duration 8 weeks    PT Treatment/Interventions ADLs/Self Care Home Management;Aquatic Therapy;Cryotherapy;Electrical Stimulation;Moist Heat;Iontophoresis 4mg /ml Dexamethasone;Traction;Neuromuscular re-education;Therapeutic exercise;Therapeutic activities;Patient/family education;Manual techniques;Dry needling;Taping;Functional mobility training    PT Next Visit Plan focus on global strengthening with focus on core and glute strengthening    PT Home Exercise Plan Access Code:    Consulted and Agree with Plan of Care Patient             Patient will benefit from skilled therapeutic intervention in order to improve the following deficits and impairments:  Dizziness, Difficulty walking, Decreased balance, Impaired sensation  Visit Diagnosis: Acute bilateral low back pain, unspecified whether sciatica present  Muscle weakness (generalized)  Difficulty in walking, not elsewhere classified  Unsteadiness on feet     Problem List Patient Active Problem List   Diagnosis Date Noted   Obesity 08/19/2020   Proteinuria 08/19/2020   Vitamin D deficiency 02/11/2019   Stage 3 chronic kidney disease (HCC) 02/11/2019   Mild intermittent asthma without complication 02/11/2019   Primary osteoarthritis of both hands 11/24/2018   Primary osteoarthritis of both feet 11/24/2018   DDD (degenerative disc disease), cervical 11/24/2018   DDD (degenerative disc disease), lumbar 11/24/2018   History of stroke involving cerebellum 10/13/2018   Gait abnormality 10/13/2018   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   Asthma 08/12/2018   Hypothyroidism 08/12/2018   Rheumatoid arthritis (HCC) 07/04/2018    Essential hypertension 07/04/2018   Hyperlipidemia 07/04/2018   Hyperglycemia 05/28/2017   Seasonal allergic rhinitis 05/28/2017   Gouty arthropathy 09/15/2010   Osteoarthrosis, generalized, involving multiple sites 09/15/2010   Esophagitis 08/30/2010   10/30/2010 PT, DPT  06/08/21 5:05 PM   [facility] [address] [place], [place], [place] Phone: [phone]   Fax:  [phone]  Name: Alexis Shelton MRN: [name] Date of Birth: April 14, 1941

## 2021-06-13 ENCOUNTER — Encounter: Payer: Medicare Other | Admitting: Physical Therapy

## 2021-06-15 ENCOUNTER — Other Ambulatory Visit: Payer: Self-pay

## 2021-06-15 ENCOUNTER — Ambulatory Visit: Payer: Medicare Other | Admitting: Physical Therapy

## 2021-06-15 DIAGNOSIS — R2681 Unsteadiness on feet: Secondary | ICD-10-CM | POA: Diagnosis not present

## 2021-06-15 DIAGNOSIS — M545 Low back pain, unspecified: Secondary | ICD-10-CM | POA: Diagnosis not present

## 2021-06-15 DIAGNOSIS — R262 Difficulty in walking, not elsewhere classified: Secondary | ICD-10-CM | POA: Diagnosis not present

## 2021-06-15 DIAGNOSIS — R293 Abnormal posture: Secondary | ICD-10-CM | POA: Diagnosis not present

## 2021-06-15 DIAGNOSIS — M6281 Muscle weakness (generalized): Secondary | ICD-10-CM | POA: Diagnosis not present

## 2021-06-15 NOTE — Therapy (Signed)
[facility] [address] [place], [place], [address] Phone: [phone]   Fax:  [phone]  Physical Therapy Treatment  Patient Details  Name: Alexis Shelton MRN: [id number] Date of Birth: [birth year] Referring Provider (PT): Dr. Lavada Mesi   Encounter Date: 06/15/2021   PT End of Session - 06/15/21 1434     Visit Number 18    Date for PT Re-Evaluation 07/04/21    Authorization Type Medicare/[employer]etna    Authorization Time Period KX at visit 15    Progress Note Due on Visit 20    PT Start Time 1400    PT Stop Time 1438    PT Time Calculation (min) 38 min    Activity Tolerance Patient tolerated treatment well;No increased pain             Past Medical History:  Diagnosis Date   Arthritis    DDD (degenerative disc disease), lumbar 11/24/2018   Dizziness    Gallstones    Hearing loss    History of stroke involving cerebellum 10/13/2018   Hypercholesteremia    Hypertension    Hypothyroidism    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   Migraines    Mild intermittent asthma without complication 02/11/2019   Primary osteoarthritis of both feet 11/24/2018   Rheumatoid arthritis (HCC) 07/04/2018   Stage 3 chronic kidney disease (HCC) 02/11/2019   Vitamin D deficiency 02/11/2019    Past Surgical History:  Procedure Laterality Date   ABDOMINAL HYSTERECTOMY     BREAST REDUCTION SURGERY Bilateral    CATARACT EXTRACTION, BILATERAL     INCISION AND DRAINAGE BREAST ABSCESS Left    REDUCTION MAMMAPLASTY     REPLACEMENT TOTAL KNEE Bilateral    TONSILLECTOMY AND ADENOIDECTOMY      There were no vitals filed for this visit.   Subjective Assessment - 06/15/21 1403     Subjective Feeling pretty good.  My cat got out this morning and I had to chase it.  PT is helping.    Less pain walking.  Less tired.  Standing for a meeting bothers my back.    Pertinent History decreased hearing Lt ear after abdominal sx  in 1990 - may have been a small stroke that caused hearing loss; (CVA was in the cerebellum);  RA, HTN, idiopathic gout, h/o CVA involving cerebellum, OA hands & feet, DDD cervical & lumbar, stage 3 CKD, s/p bil. TKR's, mild peripheral neuropathy; history of vertigo; tried aquatic ex but didn't like    How long can you stand comfortably? hard to cook b/c of hands;  I get tired when I'm on my feet a while    How long can you walk comfortably? with a cane 2 blocks; shopping cart around a store 1 hour    Patient Stated Goals get rid of pain; decreased reliance on medication; start walking program; sleep better; start an ex program--maybe at the gym    Currently in Pain? No/denies    Pain Score 0-No pain    Aggravating Factors  standing                               [facility] Adult PT Treatment/Exercise - 06/15/21 0001       Lumbar Exercises: Aerobic   Nustep Nu-Step L3 10 min      Lumbar Exercises: Standing   Row Both;15 reps    Row Limitations in wide staggered stance  25# 2x10    Shoulder Extension Power Tower;Both;10 reps    Shoulder Extension Limitations 15# extension with wide base of support    Other Standing Lumbar Exercises 5# snatch to the shoulder    Other Standing Lumbar Exercises 2 5# weights 2 x10 partial dead lift      Lumbar Exercises: Seated   Other Seated Lumbar Exercises abdominal setting with UE push down on surfaces or thighs with 5 sec hold      Knee/Hip Exercises: Standing   Forward Step Up Both;1 set;10 reps;Hand Hold: 1    Forward Step Up Limitations holding 5lb weight      Knee/Hip Exercises: Seated   Sit to Sand 10 reps   5# kettlbell     Shoulder Exercises: Standing   Other Standing Exercises staggered stance power tower chest press single UE; x10 Lt/Rt 15#                      PT Short Term Goals - 05/31/21 1613       PT SHORT TERM GOAL #1   Title independent with initial HEP    Time 4    Period Weeks    Status Achieved     Target Date 04/11/21      PT SHORT TERM GOAL #2   Title The patient will report a 30% improvement in back pain at night, with standing and walking    Baseline leakage and not always aware of leakage, if more leakage not aware of it    Time 4    Period Weeks    Status Achieved    Target Date 04/26/20      PT SHORT TERM GOAL #3   Title The patient will have improved FOTO score to 60%    Baseline 56%    Time 4    Period Weeks    Status On-going    Target Date 04/26/20      PT SHORT TERM GOAL #4   Title Lumbar extension to 15 degrees and hip extension to 10 degrees needed for improved mobility for walking    Time 4    Period Weeks    Status Achieved   17 degrees   Target Date 04/26/20               PT Long Term Goals - 06/08/21 1703       PT LONG TERM GOAL #1   Title The patient will be independent in a safe self progression of a HEP and/or a gym program    Time 8    Period Weeks    Status On-going      PT LONG TERM GOAL #2   Title The patient will have lumbo/pelvic/hip strength grossly 4+/5 needed for walking/standing for longer periods of time with less discomfort    Time 8    Period Weeks    Status On-going                   Plan - 06/15/21 1739     Clinical Impression Statement Several seated breaks needed between standing ex's secondary to shortness of breath rather than back pain.  Verbal cues on activating transversus abdominus muscles during ex's for added benefit.  Also encouraged a wider stance in standing for stability.  She is on track to meet the majority of goals in 2-3 weeks.    Comorbidities abdominal hysterectomy with hearing loss Lt ear after surgery, RA; h/o CVA involving cerebellum,  HTN, idiopathic gout, OA hands and feet, DDD cervical and lumbar, stage 3 CKD, s/p bil. TKR's, peripheral neuropathy    Examination-Activity Limitations Hygiene/Grooming;Locomotion Level;Transfers;Bed Mobility;Bend;Stand    Examination-Participation  Restrictions Community Activity;Cleaning;Meal Prep;Shop;Laundry    Stability/Clinical Decision Making Evolving/Moderate complexity    Rehab Potential Good    PT Frequency 2x / week    PT Duration 8 weeks    PT Treatment/Interventions ADLs/Self Care Home Management;Aquatic Therapy;Cryotherapy;Electrical Stimulation;Moist Heat;Iontophoresis /ml Dexamethasone;Traction;Neuromuscular re-education;Therapeutic exercise;Therapeutic activities;Patient/family education;Manual techniques;Dry needling;Taping;Functional mobility training    PT Next Visit Plan strengthening with focus on core and glute strengthening    PT Home Exercise Plan Access Code: [email or web address]             Patient will benefit from skilled therapeutic intervention in order to improve the following deficits and impairments:     Visit Diagnosis: Acute bilateral low back pain, unspecified whether sciatica present  Muscle weakness (generalized)  Difficulty in walking, not elsewhere classified     Problem List Patient Active Problem List   Diagnosis Date Noted   Obesity 08/19/2020   Proteinuria 08/19/2020   Vitamin D deficiency 02/11/2019   Stage 3 chronic kidney disease (HCC) 02/11/2019   Mild intermittent asthma without complication 02/11/2019   Primary osteoarthritis of both hands 11/24/2018   Primary osteoarthritis of both feet 11/24/2018   DDD (degenerative disc disease), cervical 11/24/2018   DDD (degenerative disc disease), lumbar 11/24/2018   History of stroke involving cerebellum 10/13/2018   Gait abnormality 10/13/2018   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   Asthma 08/12/2018   Hypothyroidism 08/12/2018   Rheumatoid arthritis (HCC) 07/04/2018   Essential hypertension 07/04/2018   Hyperlipidemia 07/04/2018   Hyperglycemia 05/28/2017   Seasonal allergic rhinitis 05/28/2017   Gouty arthropathy 09/15/2010   Osteoarthrosis, generalized, involving multiple sites 09/15/2010   Esophagitis 08/30/2010    Lavinia Sharps, PT 06/15/21 5:44 PM Phone: [phone] Fax: [phone]   Vivien Presto 06/15/2021, 5:43 PM  [facility] [address] [place], [place], [address] Phone: [phone]   Fax:  [phone]  Name: Alexis Shelton MRN: [id number] Date of Birth: [birth year]

## 2021-06-19 ENCOUNTER — Other Ambulatory Visit: Payer: Self-pay

## 2021-06-19 ENCOUNTER — Ambulatory Visit: Payer: Medicare Other | Admitting: Physical Therapy

## 2021-06-19 DIAGNOSIS — R2681 Unsteadiness on feet: Secondary | ICD-10-CM

## 2021-06-19 DIAGNOSIS — M545 Low back pain, unspecified: Secondary | ICD-10-CM

## 2021-06-19 DIAGNOSIS — M6281 Muscle weakness (generalized): Secondary | ICD-10-CM

## 2021-06-19 DIAGNOSIS — R262 Difficulty in walking, not elsewhere classified: Secondary | ICD-10-CM | POA: Diagnosis not present

## 2021-06-19 DIAGNOSIS — R293 Abnormal posture: Secondary | ICD-10-CM | POA: Diagnosis not present

## 2021-06-19 NOTE — Therapy (Signed)
[facility] [address] [place], [place], [address] Phone: [phone]   Fax:  [phone]  Physical Therapy Treatment  Patient Details  Name: Alexis Shelton MRN: [id number] Date of Birth: October 28, 1941 Referring Provider (PT): Dr. Lavada Mesi   Encounter Date: 06/19/2021   PT End of Session - 06/19/21 1719     Visit Number 19    Date for PT Re-Evaluation 07/04/21    Authorization Type Medicare/[employer]etna    Authorization Time Period KX at visit 15    Progress Note Due on Visit 20    PT Start Time 1534    PT Stop Time 1615    PT Time Calculation (min) 41 min    Activity Tolerance Patient tolerated treatment well;No increased pain    Behavior During Therapy [facility] for tasks assessed/performed             Past Medical History:  Diagnosis Date   Arthritis    DDD (degenerative disc disease), lumbar 11/24/2018   Dizziness    Gallstones    Hearing loss    History of stroke involving cerebellum 10/13/2018   Hypercholesteremia    Hypertension    Hypothyroidism    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   Migraines    Mild intermittent asthma without complication 02/11/2019   Primary osteoarthritis of both feet 11/24/2018   Rheumatoid arthritis (HCC) 07/04/2018   Stage 3 chronic kidney disease (HCC) 02/11/2019   Vitamin D deficiency 02/11/2019    Past Surgical History:  Procedure Laterality Date   ABDOMINAL HYSTERECTOMY     BREAST REDUCTION SURGERY Bilateral    CATARACT EXTRACTION, BILATERAL     INCISION AND DRAINAGE BREAST ABSCESS Left    REDUCTION MAMMAPLASTY     REPLACEMENT TOTAL KNEE Bilateral    TONSILLECTOMY AND ADENOIDECTOMY      There were no vitals filed for this visit.   Subjective Assessment - 06/19/21 1717     Subjective I've been having a lot of emotional issues with my son but my physical health is good.    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 TKR's, mild peripheral neuropathy; history of vertigo; tried aquatic ex but didn't like    How long can you sit comfortably? feels better    How long can you stand comfortably? hard to cook b/c of hands;  I get tired when I'm on my feet a while    How long can you walk comfortably? with a cane 2 blocks; shopping cart around a store 1 hour    Diagnostic tests none for back    Patient Stated Goals get rid of pain; decreased reliance on medication; start walking program; sleep better; start an ex program--maybe at the gym    Currently in Pain? No/denies                               [facility] Adult PT Treatment/Exercise - 06/19/21 0001       Lumbar Exercises: Aerobic   Nustep Nu-Step L3 6 minutes; PT present to discuss progress and assess response to activity      Lumbar Exercises: Standing   Row Both    Row Limitations  side stagger stance; 25#; 2 x 10reps    Shoulder Extension Power Tower;Both;10 reps    Shoulder Extension Limitations wide stagger; 15#; 2 x 10 reps    Other Standing Lumbar Exercises 2 5# weights 2 x10 partial dead lift   max verbal cuing for form with patient unable to perform full set correctly this date     Knee/Hip Exercises: Standing   Forward Step Up Both;1 set;10 reps;Hand Hold: 1    Forward Step Up Limitations holding 5lb weight      Shoulder Exercises: Standing   Other Standing Exercises staggered stance power tower chest press; bil UE; 2 x 10 reps                      PT Short Term Goals - 05/31/21 1613       PT SHORT TERM GOAL #1   Title independent with initial HEP    Time 4    Period Weeks    Status Achieved    Target Date 04/11/21      PT SHORT TERM GOAL #2   Title The patient will report a 30% improvement in back pain at night,  with standing and walking    Baseline leakage and not always aware of leakage, if more leakage not aware of it    Time 4    Period Weeks    Status Achieved    Target Date 04/26/20      PT SHORT TERM GOAL #3   Title The patient will have improved FOTO score to 60%    Baseline 56%    Time 4    Period Weeks    Status On-going    Target Date 04/26/20      PT SHORT TERM GOAL #4   Title Lumbar extension to 15 degrees and hip extension to 10 degrees needed for improved mobility for walking    Time 4    Period Weeks    Status Achieved   17 degrees   Target Date 04/26/20               PT Long Term Goals - 06/19/21 1719       PT LONG TERM GOAL #1   Title The patient will be independent in a safe self progression of a HEP and/or a gym program    Time 8    Period Weeks    Status On-going      PT LONG TERM GOAL #3   Title The patient will report a 60% improvement in back pain at night and with standing/walking    Time 8    Period Weeks    Status On-going                   Plan - 06/19/21 1717     Clinical Impression Statement Patient requiring increased seated recovery periods this date. Requiring max verbal cues for form when performing dead lift activity despite having performed correctly in previous sessions. Demonstrating improved postural stability with standing row and shoulder extension exercise.    Personal Factors and Comorbidities Fitness;Age;Time since onset of injury/illness/exacerbation;Comorbidity 2    Comorbidities abdominal hysterectomy with hearing loss Lt ear after surgery, RA; h/o CVA involving cerebellum, HTN, idiopathic gout, OA hands and feet, DDD cervical and lumbar, stage 3 CKD, s/p bil. TKR's, peripheral neuropathy    Examination-Activity Limitations Hygiene/Grooming;Locomotion Level;Transfers;Bed Mobility;Bend;Stand    Examination-Participation Restrictions Community Activity;Cleaning;Meal Prep;Shop;Laundry    Rehab Potential Good  PT  Frequency 2x / week    PT Duration 8 weeks    PT Treatment/Interventions ADLs/Self Care Home Management;Aquatic Therapy;Cryotherapy;Electrical Stimulation;Moist Heat;Iontophoresis 4mg /ml Dexamethasone;Traction;Neuromuscular re-education;Therapeutic exercise;Therapeutic activities;Patient/family education;Manual techniques;Dry needling;Taping;Functional mobility training    PT Next Visit Plan continue glute and core strengtheing with functional component as appropriate    PT Home Exercise Plan Access Code: [email or web address]    Consulted and Agree with Plan of Care Patient             Patient will benefit from skilled therapeutic intervention in order to improve the following deficits and impairments:  Dizziness, Difficulty walking, Decreased balance, Impaired sensation  Visit Diagnosis: Acute bilateral low back pain, unspecified whether sciatica present  Muscle weakness (generalized)  Difficulty in walking, not elsewhere classified  Unsteadiness on feet  Abnormal posture     Problem List Patient Active Problem List   Diagnosis Date Noted   Obesity 08/19/2020   Proteinuria 08/19/2020   Vitamin D deficiency 02/11/2019   Stage 3 chronic kidney disease (HCC) 02/11/2019   Mild intermittent asthma without complication 02/11/2019   Primary osteoarthritis of both hands 11/24/2018   Primary osteoarthritis of both feet 11/24/2018   DDD (degenerative disc disease), cervical 11/24/2018   DDD (degenerative disc disease), lumbar 11/24/2018   History of stroke involving cerebellum 10/13/2018   Gait abnormality 10/13/2018   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   Asthma 08/12/2018   Hypothyroidism 08/12/2018   Rheumatoid arthritis (HCC) 07/04/2018   Essential hypertension 07/04/2018   Hyperlipidemia 07/04/2018   Hyperglycemia 05/28/2017   Seasonal allergic rhinitis 05/28/2017   Gouty arthropathy 09/15/2010   Osteoarthrosis, generalized, involving multiple sites 09/15/2010    Esophagitis 08/30/2010    Anabel Halon PT, DPT  06/19/21 5:20 PM    [facility] [address] [place], [place], [address] Phone: [phone]   Fax:  [phone]  Name: Alexis Shelton MRN: [id number] Date of Birth: [birth year]

## 2021-06-20 ENCOUNTER — Other Ambulatory Visit: Payer: Self-pay | Admitting: Obstetrics and Gynecology

## 2021-06-20 ENCOUNTER — Ambulatory Visit: Payer: Medicare Other | Admitting: Podiatry

## 2021-06-20 NOTE — Telephone Encounter (Signed)
I called patient and asked her if she needed refill on betamethasone ointment. Patient said she does not need a refill on Rx. Rx denied.

## 2021-06-21 ENCOUNTER — Other Ambulatory Visit: Payer: Self-pay

## 2021-06-21 ENCOUNTER — Ambulatory Visit: Payer: Medicare Other | Admitting: Obstetrics and Gynecology

## 2021-06-21 ENCOUNTER — Ambulatory Visit: Payer: Medicare Other | Admitting: Physical Therapy

## 2021-06-21 DIAGNOSIS — R262 Difficulty in walking, not elsewhere classified: Secondary | ICD-10-CM | POA: Diagnosis not present

## 2021-06-21 DIAGNOSIS — M6281 Muscle weakness (generalized): Secondary | ICD-10-CM | POA: Diagnosis not present

## 2021-06-21 DIAGNOSIS — M545 Low back pain, unspecified: Secondary | ICD-10-CM

## 2021-06-21 DIAGNOSIS — R293 Abnormal posture: Secondary | ICD-10-CM | POA: Diagnosis not present

## 2021-06-21 DIAGNOSIS — R2681 Unsteadiness on feet: Secondary | ICD-10-CM | POA: Diagnosis not present

## 2021-06-21 NOTE — Therapy (Signed)
[facility] [address] [place], [place], [address] Phone: [phone]   Fax:  [phone]  Physical Therapy Treatment  Patient Details  Name: Alexis Shelton MRN: [id number] Date of Birth: 1941-04-18 Referring Provider (PT): Dr. Lavada Mesi   Encounter Date: 06/21/2021  Progress Note Reporting Period 05/02/2021 to 06/21/2021  See note below for Objective Data and Assessment of Progress/Goals.       PT End of Session - 06/21/21 1707     Visit Number 20    Date for PT Re-Evaluation 07/04/21    Authorization Type Medicare/Aetna    Authorization Time Period KX at visit 15    Progress Note Due on Visit 30    PT Start Time 1532    PT Stop Time 1610    PT Time Calculation (min) 38 min    Activity Tolerance Patient tolerated treatment well;No increased pain    Behavior During Therapy [facility] for tasks assessed/performed             Past Medical History:  Diagnosis Date   Arthritis    DDD (degenerative disc disease), lumbar 11/24/2018   Dizziness    Gallstones    Hearing loss    History of stroke involving cerebellum 10/13/2018   Hypercholesteremia    Hypertension    Hypothyroidism    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   Migraines    Mild intermittent asthma without complication 02/11/2019   Primary osteoarthritis of both feet 11/24/2018   Rheumatoid arthritis (HCC) 07/04/2018   Stage 3 chronic kidney disease (HCC) 02/11/2019   Vitamin D deficiency 02/11/2019    Past Surgical History:  Procedure Laterality Date   ABDOMINAL HYSTERECTOMY     BREAST REDUCTION SURGERY Bilateral    CATARACT EXTRACTION, BILATERAL     INCISION AND DRAINAGE BREAST ABSCESS Left    REDUCTION MAMMAPLASTY     REPLACEMENT TOTAL KNEE Bilateral    TONSILLECTOMY AND ADENOIDECTOMY      There were no vitals filed for this visit.   Subjective Assessment - 06/21/21 1548     Subjective Feels pretty good today physically.     Pertinent History decreased hearing Lt ear after abdominal sx in 1990 - may have been a small stroke that caused hearing loss; (CVA was in the cerebellum);  RA, HTN, idiopathic gout, h/o CVA involving cerebellum, OA hands & feet, DDD cervical & lumbar, stage 3 CKD, s/p bil. TKR's, mild peripheral neuropathy; history of vertigo; tried aquatic ex but didn't like    How long can you sit comfortably? feels better    How long can you stand comfortably? hard to cook b/c of hands;  I get tired when I'm on my feet a while    How long can you walk comfortably? with a cane 2 blocks; shopping cart around a store 1 hour    Diagnostic tests none for back    Patient Stated Goals get rid of pain; decreased reliance on medication; start walking program; sleep better; start an ex program--maybe at the gym    Currently in Pain? No/denies                [facility] PT Assessment - 06/21/21 0001       Assessment   Medical Diagnosis left hip pain; LBP    Referring Provider (PT) Dr. Casimiro Needle Hilts    Onset Date/Surgical Date --   January   Prior Therapy pelvic floor with Elnita Maxwell; vestibular rehab  Precautions   Precautions Fall      Restrictions   Weight Bearing Restrictions No      Balance Screen   Has the patient fallen in the past 6 months No    Has the patient had a decrease in activity level because of a fear of falling?  Yes    Is the patient reluctant to leave their home because of a fear of falling?  No      Home Environment   Living Environment Private residence    Living Arrangements Alone    Type of Home Apartment    Home Access Level entry    Home Layout One level      Prior Function   Level of Independence Independent    Vocation Retired      Strength   Right Hip Flexion 4/5    Right Hip Extension 4/5    Right Hip ABduction 4/5    Left Hip Flexion 4/5    Left Hip Extension 4/5    Left Hip ABduction 4/5                           [facility] Adult PT  Treatment/Exercise - 06/21/21 0001       Lumbar Exercises: Aerobic   Nustep L2 x 10 minutes: PT present to discuss progress and assess response to activity      Lumbar Exercises: Seated   Other Seated Lumbar Exercises row, seated on blue ball, 20#, x10 reps    Other Seated Lumbar Exercises shoulder extension, seated on blue ball, 15# x 10 reps      Knee/Hip Exercises: Standing   Abduction Limitations 3-way hip, green loop, x10 Lt/Rt    Other Standing Knee Exercises resisted walk: 4 directions x 3 each; 15#   intermittent min assist for loss of balance                     PT Short Term Goals - 05/31/21 1613       PT SHORT TERM GOAL #1   Title independent with initial HEP    Time 4    Period Weeks    Status Achieved    Target Date 04/11/21      PT SHORT TERM GOAL #2   Title The patient will report a 30% improvement in back pain at night, with standing and walking    Baseline leakage and not always aware of leakage, if more leakage not aware of it    Time 4    Period Weeks    Status Achieved    Target Date 04/26/20      PT SHORT TERM GOAL #3   Title The patient will have improved FOTO score to 60%    Baseline 56%    Time 4    Period Weeks    Status On-going    Target Date 04/26/20      PT SHORT TERM GOAL #4   Title Lumbar extension to 15 degrees and hip extension to 10 degrees needed for improved mobility for walking    Time 4    Period Weeks    Status Achieved   17 degrees   Target Date 04/26/20               PT Long Term Goals - 06/21/21 1707       PT LONG TERM GOAL #2   Title The patient will have lumbo/pelvic/hip strength grossly 4+/5  needed for walking/standing for longer periods of time with less discomfort    Time 8    Period Weeks    Status On-going                   Plan - 06/21/21 1705     Clinical Impression Statement Patient is an 80 y/o female referred due to low back pain and Lt hip pain. She demonstrates continued  bilateral hip strength impairments. Patient requiring intermittent min assist during resisted walking activity indicating impaired hip abd/adduction strength and dynamic balance impairments. Tactile cues for decreased anterior trunk lean when performing resisted hip extension. Would benefit from continued core and hip strengthening.    Personal Factors and Comorbidities Fitness;Age;Time since onset of injury/illness/exacerbation;Comorbidity 2    Comorbidities abdominal hysterectomy with hearing loss Lt ear after surgery, RA; h/o CVA involving cerebellum, HTN, idiopathic gout, OA hands and feet, DDD cervical and lumbar, stage 3 CKD, s/p bil. TKR's, peripheral neuropathy    Examination-Activity Limitations Hygiene/Grooming;Locomotion Level;Transfers;Bed Mobility;Bend;Stand    Examination-Participation Restrictions Community Activity;Cleaning;Meal Prep;Shop;Laundry    Stability/Clinical Decision Making Evolving/Moderate complexity    Clinical Decision Making Moderate    Rehab Potential Good    PT Frequency 2x / week    PT Duration 8 weeks    PT Treatment/Interventions ADLs/Self Care Home Management;Aquatic Therapy;Cryotherapy;Electrical Stimulation;Moist Heat;Iontophoresis 4mg /ml Dexamethasone;Traction;Neuromuscular re-education;Therapeutic exercise;Therapeutic activities;Patient/family education;Manual techniques;Dry needling;Taping;Functional mobility training    PT Next Visit Plan progress glute and core strengthening    PT Home Exercise Plan Access Code:    Consulted and Agree with Plan of Care Patient             Patient will benefit from skilled therapeutic intervention in order to improve the following deficits and impairments:  Dizziness, Difficulty walking, Decreased balance, Impaired sensation  Visit Diagnosis: Acute bilateral low back pain, unspecified whether sciatica present  Muscle weakness (generalized)  Difficulty in walking, not elsewhere classified  Unsteadiness  on feet     Problem List Patient Active Problem List   Diagnosis Date Noted   Obesity 08/19/2020   Proteinuria 08/19/2020   Vitamin D deficiency 02/11/2019   Stage 3 chronic kidney disease (HCC) 02/11/2019   Mild intermittent asthma without complication 02/11/2019   Primary osteoarthritis of both hands 11/24/2018   Primary osteoarthritis of both feet 11/24/2018   DDD (degenerative disc disease), cervical 11/24/2018   DDD (degenerative disc disease), lumbar 11/24/2018   History of stroke involving cerebellum 10/13/2018   Gait abnormality 10/13/2018   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   Asthma 08/12/2018   Hypothyroidism 08/12/2018   Rheumatoid arthritis (HCC) 07/04/2018   Essential hypertension 07/04/2018   Hyperlipidemia 07/04/2018   Hyperglycemia 05/28/2017   Seasonal allergic rhinitis 05/28/2017   Gouty arthropathy 09/15/2010   Osteoarthrosis, generalized, involving multiple sites 09/15/2010   Esophagitis 08/30/2010   10/30/2010 PT, DPT  06/21/21 5:12 PM   [facility] [address] [place], [place], [place] Phone: [phone]   Fax:  [phone]  Name: Glorya Bartley MRN: [name] Date of Birth: July 17, 1941

## 2021-06-27 ENCOUNTER — Other Ambulatory Visit: Payer: Self-pay

## 2021-06-27 ENCOUNTER — Ambulatory Visit: Payer: Medicare Other | Attending: Family Medicine | Admitting: Physical Therapy

## 2021-06-27 DIAGNOSIS — R2681 Unsteadiness on feet: Secondary | ICD-10-CM | POA: Diagnosis not present

## 2021-06-27 DIAGNOSIS — R262 Difficulty in walking, not elsewhere classified: Secondary | ICD-10-CM | POA: Insufficient documentation

## 2021-06-27 DIAGNOSIS — M545 Low back pain, unspecified: Secondary | ICD-10-CM | POA: Insufficient documentation

## 2021-06-27 DIAGNOSIS — M6281 Muscle weakness (generalized): Secondary | ICD-10-CM | POA: Diagnosis not present

## 2021-06-27 NOTE — Therapy (Signed)
[facility] [address], [address] [place], [place], [address] Phone: [phone]   Fax:  [phone]  Physical Therapy Treatment  Patient Details  Name: Alexis Shelton MRN: [id number] Date of Birth: [birth year] Referring Provider (PT): Dr. Lavada Mesi   Encounter Date: 06/27/2021   PT End of Session - 06/27/21 1607     Visit Number 21    Date for PT Re-Evaluation 07/04/21    Authorization Type Medicare/Aetna    Authorization Time Period KX at visit 15    Progress Note Due on Visit 30    PT Start Time 1530    PT Stop Time 1614    PT Time Calculation (min) 44 min    Activity Tolerance Patient tolerated treatment well;No increased pain             Past Medical History:  Diagnosis Date   Arthritis    DDD (degenerative disc disease), lumbar 11/24/2018   Dizziness    Gallstones    Hearing loss    History of stroke involving cerebellum 10/13/2018   Hypercholesteremia    Hypertension    Hypothyroidism    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   Migraines    Mild intermittent asthma without complication 02/11/2019   Primary osteoarthritis of both feet 11/24/2018   Rheumatoid arthritis (HCC) 07/04/2018   Stage 3 chronic kidney disease (HCC) 02/11/2019   Vitamin D deficiency 02/11/2019    Past Surgical History:  Procedure Laterality Date   ABDOMINAL HYSTERECTOMY     BREAST REDUCTION SURGERY Bilateral    CATARACT EXTRACTION, BILATERAL     INCISION AND DRAINAGE BREAST ABSCESS Left    REDUCTION MAMMAPLASTY     REPLACEMENT TOTAL KNEE Bilateral    TONSILLECTOMY AND ADENOIDECTOMY      There were no vitals filed for this visit.   Subjective Assessment - 06/27/21 1533     Subjective I feel like I"m improved.  I'm happy about that.    Pertinent History decreased hearing Lt ear after abdominal sx in 1990 - may have been a small stroke that caused hearing loss; (CVA was in the cerebellum);  RA, HTN, idiopathic  gout, h/o CVA involving cerebellum, OA hands & feet, DDD cervical & lumbar, stage 3 CKD, s/p bil. TKR's, mild peripheral neuropathy; history of vertigo; tried aquatic ex but didn't like    Patient Stated Goals get rid of pain; decreased reliance on medication; start walking program; sleep better; start an ex program--maybe at the gym    Currently in Pain? No/denies    Pain Score 0-No pain                               [facility] Adult PT Treatment/Exercise - 06/27/21 0001       Self-Care   Other Self-Care Comments  discussion on community based ex program options      Lumbar Exercises: Aerobic   Nustep Nu-Step L3 8 minutes; PT present to discuss progress and assess response to activity      Lumbar Exercises: Standing   Other Standing Lumbar Exercises dynamic warm up: marching, kicks, side step, backwards, backscratchers, touch down 2 laps each    Other Standing Lumbar Exercises every min on the minute: sit to stand 10# 10x, red band rows 20x, step ups 10x; counter pushups 10x; red band shoulder extensions 20x  PT Education - 06/27/21 1606     Education Details community based ex options    Person(s) Educated Patient    Methods Explanation;Handout    Comprehension Verbalized understanding              PT Short Term Goals - 05/31/21 1613       PT SHORT TERM GOAL #1   Title independent with initial HEP    Time 4    Period Weeks    Status Achieved    Target Date 04/11/21      PT SHORT TERM GOAL #2   Title The patient will report a 30% improvement in back pain at night, with standing and walking    Baseline leakage and not always aware of leakage, if more leakage not aware of it    Time 4    Period Weeks    Status Achieved    Target Date 04/26/20      PT SHORT TERM GOAL #3   Title The patient will have improved FOTO score to 60%    Baseline 56%    Time 4    Period Weeks    Status On-going    Target Date 04/26/20       PT SHORT TERM GOAL #4   Title Lumbar extension to 15 degrees and hip extension to 10 degrees needed for improved mobility for walking    Time 4    Period Weeks    Status Achieved   17 degrees   Target Date 04/26/20               PT Long Term Goals - 06/21/21 1707       PT LONG TERM GOAL #2   Title The patient will have lumbo/pelvic/hip strength grossly 4+/5 needed for walking/standing for longer periods of time with less discomfort    Time 8    Period Weeks    Status On-going                   Plan - 06/27/21 1607     Clinical Impression Statement The patient is able to perform a progression of intensity of exercise to "every minute on the minute" of core and general strengthening ex's.  She does become mild to moderately short of breath at the 5 minute mark but this resolves fully and quickly with a seated rest break.  Therapist monitoring response throughout treatment session.  She does not complain of LBP or hip pain today.  She is considering her options for a community based ex program upon discharge from PT.  Anticipate she will meet the remaining rehab goals in the next 2 weeks.    Comorbidities abdominal hysterectomy with hearing loss Lt ear after surgery, RA; h/o CVA involving cerebellum, HTN, idiopathic gout, OA hands and feet, DDD cervical and lumbar, stage 3 CKD, s/p bil. TKR's, peripheral neuropathy    Rehab Potential Good    PT Frequency 2x / week    PT Duration 8 weeks    PT Treatment/Interventions ADLs/Self Care Home Management;Aquatic Therapy;Cryotherapy;Electrical Stimulation;Moist Heat;Iontophoresis 4mg /ml Dexamethasone;Traction;Neuromuscular re-education;Therapeutic exercise;Therapeutic activities;Patient/family education;Manual techniques;Dry needling;Taping;Functional mobility training    PT Next Visit Plan KX;  progress glute and core strengthening;  assess response to every min on the minute    PT Home Exercise Plan Access Code:              Patient will benefit from skilled therapeutic intervention in order to improve the following deficits and impairments:  Dizziness, Difficulty walking, Decreased balance, Impaired sensation  Visit Diagnosis: Acute bilateral low back pain, unspecified whether sciatica present  Muscle weakness (generalized)  Difficulty in walking, not elsewhere classified  Unsteadiness on feet     Problem List Patient Active Problem List   Diagnosis Date Noted   Obesity 08/19/2020   Proteinuria 08/19/2020   Vitamin D deficiency 02/11/2019   Stage 3 chronic kidney disease (HCC) 02/11/2019   Mild intermittent asthma without complication 02/11/2019   Primary osteoarthritis of both hands 11/24/2018   Primary osteoarthritis of both feet 11/24/2018   DDD (degenerative disc disease), cervical 11/24/2018   DDD (degenerative disc disease), lumbar 11/24/2018   History of stroke involving cerebellum 10/13/2018   Gait abnormality 10/13/2018   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   Asthma 08/12/2018   Hypothyroidism 08/12/2018   Rheumatoid arthritis (HCC) 07/04/2018   Essential hypertension 07/04/2018   Hyperlipidemia 07/04/2018   Hyperglycemia 05/28/2017   Seasonal allergic rhinitis 05/28/2017   Gouty arthropathy 09/15/2010   Osteoarthrosis, generalized, involving multiple sites 09/15/2010   Esophagitis 08/30/2010   Lavinia Sharps, PT 06/27/21 4:18 PM Phone: [phone] Fax: [phone]  Vivien Presto 06/27/2021, 4:17 PM  [facility] [address] [place], [place], [address] Phone: [phone]   Fax:  [phone]  Name: Alexis Shelton MRN: [id number] Date of Birth: 1941/08/29

## 2021-06-27 NOTE — Patient Instructions (Signed)
    [employer] area  caters to > 82 years old or ongoing conditions/medical issues    [facility]  a little $$$ but has pool, hot tub, Publishing copy Active Adult [employer] near [place] (has a pool)

## 2021-06-28 ENCOUNTER — Other Ambulatory Visit (HOSPITAL_COMMUNITY)
Admission: RE | Admit: 2021-06-28 | Discharge: 2021-06-28 | Disposition: A | Discharge status: Home / Self Care | Payer: Medicare Other | Source: Ambulatory Visit | Attending: Obstetrics and Gynecology | Admitting: Obstetrics and Gynecology

## 2021-06-28 ENCOUNTER — Other Ambulatory Visit: Payer: Self-pay | Admitting: Internal Medicine

## 2021-06-28 ENCOUNTER — Ambulatory Visit (INDEPENDENT_AMBULATORY_CARE_PROVIDER_SITE_OTHER): Payer: Medicare Other | Admitting: Obstetrics and Gynecology

## 2021-06-28 ENCOUNTER — Encounter: Payer: Self-pay | Admitting: Obstetrics and Gynecology

## 2021-06-28 VITALS — BP 148/72 | HR 57 | Ht 62.25 in | Wt 176.0 lb

## 2021-06-28 DIAGNOSIS — L900 Lichen sclerosus et atrophicus: Secondary | ICD-10-CM

## 2021-06-28 DIAGNOSIS — N763 Subacute and chronic vulvitis: Secondary | ICD-10-CM | POA: Diagnosis not present

## 2021-06-28 DIAGNOSIS — N904 Leukoplakia of vulva: Secondary | ICD-10-CM | POA: Diagnosis not present

## 2021-06-28 NOTE — Patient Instructions (Signed)
Vulva Biopsy, Care After This sheet gives you information about how to care for yourself after your procedure. Your doctor may also give you more specific instructions. If you have problems or questions, contact your doctor. What can I expect after the procedure? After the procedure, it is common to have: Slight bleeding from the biopsy site. Soreness at the biopsy site. Follow these instructions at home: Biopsy site care  Follow instructions from your doctor about how to take care of your biopsy site. Make sure you: Clean the area using water and mild soap two times a day or as told by your doctor. Gently pat the area dry. If you were prescribed an antibiotic ointment, apply it as told by your doctor. Do not stop using the antibiotic even if your condition gets better. Take a warm water bath (sitz bath) as needed to help with pain. A sitz bath is taken while you are sitting down. The water should only come up to your hips and cover your butt. Leave stitches (sutures), skin glue, or skin tape (adhesive) strips in place. They may need to stay in place for 2 weeks or longer. If tape strips get loose and curl up, you may trim the loose edges. Do not remove tape strips completely unless your doctor says it is okay. Check your biopsy area every day for signs of infection. Check for: More redness, swelling, or pain. More fluid or blood. Warmth. Pus or a bad smell. Do not rub the biopsy area after peeing (urinating). Gently pat the area dry, or use a bottle filled with warm water (peri-bottle) to clean the area. Gently wipe from front to back. Lifestyle Wear loose, cotton underwear. Do not wear tight pants. Do not use a tampon, douche, or put anything in your vagina for at least 1 week or until your doctor says it is okay. Do not have sex for at least 1 week or until your doctor says it is okay. Do not exercise until your doctor says it is okay. Do not swim or use a hot tub until your doctor  says it is okay. You may shower or take a sitz bath. General instructions Take over-the-counter and prescription medicines only as told by your doctor. Use a sanitary pad until the bleeding stops. Keep all follow-up visits as told by your doctor. This is important. Contact a doctor if: You have more redness, swelling, or pain around your biopsy site. You have more fluid or blood coming from your biopsy site. Your biopsy site feels warm when you touch it. Medicines do not help with your pain. Get help right away if: You have a lot of bleeding from the vulva. You have pus or a bad smell coming from your biopsy site. You have a fever. You have pain in the lower belly (abdomen). Summary After the procedure, it is common to have slight bleeding and soreness at the biopsy site. Follow all instructions as told by your doctor. Clean the area with water and mild soap. Do not rub. Pat the area dry. Take sitz baths as needed. Leave any stitches in place. Check your biopsy site for infection. Signs include more redness, swelling, pain, fluid, or blood, or feeling warm when you touch it. Get help right away if you have a lot of bleeding, a fever, pus or a bad smell, or pain in your lower belly. This information is not intended to replace advice given to you by your health care provider. Make sure you discuss any   questions you have with your health care provider. Document Revised: 06/12/2018 Document Reviewed: 06/12/2018 Elsevier Patient Education  2022 [employer]  

## 2021-06-28 NOTE — Progress Notes (Signed)
GYNECOLOGY  VISIT   HPI: 80 y.o.   Widowed  Caucasian  female   [phone] with No LMP recorded. Patient has had a hysterectomy.   here for follow up vulvar lesion, possible vulvar biopsy.  Used twice a day for 2 weeks and then using twice a day two times per week.  The irritation has gone but still feels an area between the vagina and the rectum that is irritated.   Using Metamucil and Dove soap.  Bowel function is better.  Less straining.  Fecal incontinence is better controlled.  Bladder is doing quite well per patient.   GYNECOLOGIC HISTORY: No LMP recorded. Patient has had a hysterectomy. Contraception:  Hyst Menopausal hormone therapy:  none Last mammogram:   07/18/20 3D/Neg/BiRads1 Last pap smear: Unsure        OB History     Gravida  4   Para  1   Term      Preterm      AB  2   Living  1      SAB  2   IAB      Ectopic      Multiple      Live Births                 Patient Active Problem List   Diagnosis Date Noted   Obesity 08/19/2020   Proteinuria 08/19/2020   Vitamin D deficiency 02/11/2019   Stage 3 chronic kidney disease (HCC) 02/11/2019   Mild intermittent asthma without complication 02/11/2019   Primary osteoarthritis of both hands 11/24/2018   Primary osteoarthritis of both feet 11/24/2018   DDD (degenerative disc disease), cervical 11/24/2018   DDD (degenerative disc disease), lumbar 11/24/2018   History of stroke involving cerebellum 10/13/2018   Gait abnormality 10/13/2018   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   Asthma 08/12/2018   Hypothyroidism 08/12/2018   Rheumatoid arthritis (HCC) 07/04/2018   Essential hypertension 07/04/2018   Hyperlipidemia 07/04/2018   Hyperglycemia 05/28/2017   Seasonal allergic rhinitis 05/28/2017   Gouty arthropathy 09/15/2010   Osteoarthrosis, generalized, involving multiple sites 09/15/2010   Esophagitis 08/30/2010    Past Medical History:  Diagnosis Date   Arthritis    DDD  (degenerative disc disease), lumbar 11/24/2018   Dizziness    Gallstones    Hearing loss    History of stroke involving cerebellum 10/13/2018   Hypercholesteremia    Hypertension    Hypothyroidism    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   Migraines    Mild intermittent asthma without complication 02/11/2019   Primary osteoarthritis of both feet 11/24/2018   Rheumatoid arthritis (HCC) 07/04/2018   Stage 3 chronic kidney disease (HCC) 02/11/2019   Vitamin D deficiency 02/11/2019    Past Surgical History:  Procedure Laterality Date   ABDOMINAL HYSTERECTOMY     BREAST REDUCTION SURGERY Bilateral    CATARACT EXTRACTION, BILATERAL     INCISION AND DRAINAGE BREAST ABSCESS Left    REDUCTION MAMMAPLASTY     REPLACEMENT TOTAL KNEE Bilateral    TONSILLECTOMY AND ADENOIDECTOMY      Current Outpatient Medications  Medication Sig Dispense Refill   albuterol (VENTOLIN HFA) 108 (90 Base) MCG/ACT inhaler Inhale 2 puffs into the lungs every 6 (six) hours as needed for wheezing or shortness of breath. (Patient taking differently: Inhale 2 puffs into the lungs as needed for wheezing or shortness of breath.) 54 g 3   allopurinol (ZYLOPRIM) 100 MG tablet TAKE 1 TABLET BY MOUTH  EVERY DAY 90 tablet 3   aspirin 81 MG EC tablet Take 81 mg by mouth daily. Swallow whole.     atorvastatin (LIPITOR) 10 MG tablet TAKE 1 TABLET BY MOUTH EVERY DAY 90 tablet 3   atorvastatin (LIPITOR) 20 MG tablet Take 1 tablet (20 mg total) by mouth daily. 90 tablet 3   betamethasone valerate ointment (VALISONE) 0.1 % Place in a thin lay to affect area of the vulva twice daily for 2 weeks and then apply in a thin layer to the vulva twice a week at bedtime for maintenance. 45 g 0   budesonide-formoterol (SYMBICORT) 160-4.5 MCG/ACT inhaler Inhale 2 puffs into the lungs as needed. 30.6 g 3   ciclopirox (PENLAC) 8 % solution Apply topically at bedtime. Apply over nail and surrounding skin. Apply daily over previous coat. After  seven (7) days, may remove with alcohol and continue cycle. 6.6 mL 4   Clobetasol Propionate 0.05 % shampoo Apply 1 application topically 2 (two) times daily. 118 mL 6   Cyanocobalamin ([employer] VITAMIN B-12) 5000 MCG SUBL Place under the tongue daily.     diclofenac Sodium (VOLTAREN) 1 % GEL Apply 4 g topically 4 (four) times daily as needed. 500 g 6   fluconazole (DIFLUCAN) 150 MG tablet TAKE 1 TABLET BY MOUTH EVERY 3 DAYS. 5 tablet 0   furosemide (LASIX) 20 MG tablet TAKE 1 TABLET BY MOUTH EVERY DAY 90 tablet 1   ketoconazole (NIZORAL) 2 % shampoo Apply 1 application topically 2 (two) times a week. 120 mL 11   levothyroxine (SYNTHROID) 100 MCG tablet TAKE 1 TABLET BY MOUTH DAILY BEFORE BREAKFAST. 90 tablet 1   metoprolol tartrate (LOPRESSOR) 25 MG tablet TAKE 1 TABLET BY MOUTH TWICE A DAY 180 tablet 2   montelukast (SINGULAIR) 10 MG tablet TAKE 1 TABLET BY MOUTH EVERYDAY AT BEDTIME 90 tablet 3   Multiple Vitamin (MULTIVITAMIN) tablet Take 1 tablet by mouth daily.     omeprazole (PRILOSEC) 40 MG capsule Take 1 capsule (40 mg total) by mouth daily before breakfast. 30 mins before 90 capsule 3   traMADol (ULTRAM) 50 MG tablet TAKE 1 TABLET BY MOUTH EVERY 6 HOURS AS NEEDED 90 tablet 0   No current facility-administered medications for this visit.     ALLERGIES: Other  Family History  Problem Relation Age of Onset   Stroke Mother    Hyperlipidemia Mother    Arthritis Mother    Heart disease Father    Arthritis Sister    Bipolar disorder Son    Healthy Son    Gallbladder disease Maternal Grandmother        radiation   GER disease Maternal Grandmother    Heart disease Paternal Uncle        x 7   Colon cancer Neg Hx    Esophageal cancer Neg Hx    Rectal cancer Neg Hx    Stomach cancer Neg Hx    Cancer Neg Hx     Social History   Socioeconomic History   Marital status: Widowed    Spouse name: Not on file   Number of children: 1   Years of education: MD   Highest education level:  Not on file  Occupational History   Occupation: Retired - Sales promotion account executive    Comment: [employer]  Tobacco Use   Smoking status: Former    Pack years: 0.00    Types: Cigarettes    Quit date: 1970    Years since quitting: 52.5  Smokeless tobacco: Never  Vaping Use   Vaping Use: Never used  Substance and Sexual Activity   Alcohol use: Not Currently   Drug use: Never   Sexual activity: Not Currently  Other Topics Concern   Not on file  Social History Narrative   Lives alone.  The patient is widowed.   She worked at the [employer] in [place], MD scientist, and then moved to [place] and moved to [place] where her son is a International aid/development worker, after her husband died   Right-handed.   1 cup coffee per day.   No alcohol   1 son as above   Former smoker   Investment banker, operational: Not on file  Food Insecurity: Not on file  Transportation Needs: Not on file  Physical Activity: Not on file  Stress: Not on file  Social Connections: Not on file  Intimate Partner Violence: Not on file    Review of Systems  All other systems reviewed and are negative.  PHYSICAL EXAMINATION:    BP (!) 148/72   Pulse (!) 57   Ht 5' 2.25" (1.581 m)   Wt 176 lb (79.8 kg)   SpO2 98%   BMI 31.93 kg/m     General appearance: alert, cooperative and appears stated age    Pelvic: External genitalia:  hypopigmentation of the bilateral labia minora.              Urethra:  normal appearing urethra with no masses, tenderness or lesions              Bartholins and Skenes: normal                 Vagina: generalized patchy erythema of the introitus.        Vulvar biopsy  Consent for procedure.  Betadine prep.  Local 1% lidocaine, lot [id number], exp 09/2022.  3 mm punch biopsy.  Tissue to pathology.  Single 3/0 suture.  No complications.  Minimal EBL.  Chaperone was present for exam.  ASSESSMENT  Chronic vulvitis.  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  Chronic renal disease.    PLAN  Fu biopsy result.  Use Valisone ointment twice a week at bedtime for maintenance dosing.

## 2021-06-29 ENCOUNTER — Ambulatory Visit: Payer: Medicare Other | Admitting: Physical Therapy

## 2021-06-29 ENCOUNTER — Ambulatory Visit (INDEPENDENT_AMBULATORY_CARE_PROVIDER_SITE_OTHER): Payer: Medicare Other | Admitting: Podiatry

## 2021-06-29 ENCOUNTER — Other Ambulatory Visit: Payer: Self-pay

## 2021-06-29 DIAGNOSIS — M6281 Muscle weakness (generalized): Secondary | ICD-10-CM

## 2021-06-29 DIAGNOSIS — M79674 Pain in right toe(s): Secondary | ICD-10-CM

## 2021-06-29 DIAGNOSIS — R2681 Unsteadiness on feet: Secondary | ICD-10-CM

## 2021-06-29 DIAGNOSIS — M545 Low back pain, unspecified: Secondary | ICD-10-CM | POA: Diagnosis not present

## 2021-06-29 DIAGNOSIS — R262 Difficulty in walking, not elsewhere classified: Secondary | ICD-10-CM | POA: Diagnosis not present

## 2021-06-29 DIAGNOSIS — B351 Tinea unguium: Secondary | ICD-10-CM | POA: Diagnosis not present

## 2021-06-29 DIAGNOSIS — M79675 Pain in left toe(s): Secondary | ICD-10-CM

## 2021-06-29 NOTE — Patient Instructions (Signed)
Discussed another community ex option: PREP program at [employer].  Given handout.

## 2021-06-29 NOTE — Therapy (Signed)
[facility] [address], [address] [place], [place], [address] Phone: [phone]   Fax:  [phone]  Physical Therapy Treatment  Patient Details  Name: Alexis Shelton MRN: [id number] Date of Birth: 1941-06-21 Referring Provider (PT): Dr. Lavada Mesi   Encounter Date: 06/29/2021   PT End of Session - 06/29/21 1553     Visit Number 22    Date for PT Re-Evaluation 07/04/21    Authorization Type Medicare/Aetna    Authorization Time Period KX at visit 15    Progress Note Due on Visit 30    PT Start Time 1530    PT Stop Time 1610    PT Time Calculation (min) 40 min    Activity Tolerance Patient tolerated treatment well;No increased pain             Past Medical History:  Diagnosis Date   Arthritis    DDD (degenerative disc disease), lumbar 11/24/2018   Dizziness    Gallstones    Hearing loss    History of stroke involving cerebellum 10/13/2018   Hypercholesteremia    Hypertension    Hypothyroidism    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   Migraines    Mild intermittent asthma without complication 02/11/2019   Primary osteoarthritis of both feet 11/24/2018   Rheumatoid arthritis (HCC) 07/04/2018   Stage 3 chronic kidney disease (HCC) 02/11/2019   Vitamin D deficiency 02/11/2019    Past Surgical History:  Procedure Laterality Date   ABDOMINAL HYSTERECTOMY     BREAST REDUCTION SURGERY Bilateral    CATARACT EXTRACTION, BILATERAL     INCISION AND DRAINAGE BREAST ABSCESS Left    REDUCTION MAMMAPLASTY     REPLACEMENT TOTAL KNEE Bilateral    TONSILLECTOMY AND ADENOIDECTOMY      There were no vitals filed for this visit.   Subjective Assessment - 06/29/21 1532     Subjective I checked out those ex places you recommended.  No problem after last visit.  I feel healthier now.  This has really helped.    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 TKR's, mild peripheral neuropathy; history of vertigo; tried aquatic ex but didn't like    Patient Stated Goals get rid of pain; decreased reliance on medication; start walking program; sleep better; start an ex program--maybe at the gym    Currently in Pain? No/denies    Pain Score 0-No pain                               [facility] Adult PT Treatment/Exercise - 06/29/21 0001       Lumbar Exercises: Aerobic   Nustep Nu-Step L3 5 minutes; PT present to discuss progress and assess response to activity      Lumbar Exercises: Standing   Other Standing Lumbar Exercises dynamic warm up: marching, kicks, side step, backwards, backscratchers, touch down 2 laps each    Other Standing Lumbar Exercises every min on the minute: sit to stand 10# 10x, red  step ups 10x; counter pushups 10x; green band pull downs 20x, farmer's carries 1 min      Knee/Hip Exercises: Standing   Other Standing Knee Exercises every min on  the minute: 10# lifts to knee level, green band rows 15, step taps 10x, counter push ups, walking holding 2 5# weights 1 minute                    PT Education - 06/29/21 1719     Education Details PREP program    Person(s) Educated Patient    Methods Explanation;Handout    Comprehension Verbalized understanding              PT Short Term Goals - 05/31/21 1613       PT SHORT TERM GOAL #1   Title independent with initial HEP    Time 4    Period Weeks    Status Achieved    Target Date 04/11/21      PT SHORT TERM GOAL #2   Title The patient will report a 30% improvement in back pain at night, with standing and walking    Baseline leakage and not always aware of leakage, if more leakage not aware of it    Time 4    Period Weeks    Status Achieved    Target Date 04/26/20       PT SHORT TERM GOAL #3   Title The patient will have improved FOTO score to 60%    Baseline 56%    Time 4    Period Weeks    Status On-going    Target Date 04/26/20      PT SHORT TERM GOAL #4   Title Lumbar extension to 15 degrees and hip extension to 10 degrees needed for improved mobility for walking    Time 4    Period Weeks    Status Achieved   17 degrees   Target Date 04/26/20               PT Long Term Goals - 06/21/21 1707       PT LONG TERM GOAL #2   Title The patient will have lumbo/pelvic/hip strength grossly 4+/5 needed for walking/standing for longer periods of time with less discomfort    Time 8    Period Weeks    Status On-going                   Plan - 06/29/21 1554     Clinical Impression Statement The patient is receptive to continuing with a community based program post discharge from PT and is investigating to determine the right fit.  Continued with "every minute on the minute" ex with limited reps to allow rest breaks secondary to moderate shortness of breath.   She recovers in less than a minute to normal respiratory rate.  She has one incidence of loss of balance upon rising from a seated position (she attributes this to a long standing issue of "vertigo").  She is also slightly unsteady with carrying small weights (5-10#) but is able to self stabilize with cues to stop, regroup and move slower, more controlled.  Therapist monitoring response and modifying treatment accordingly.    Comorbidities abdominal hysterectomy with hearing loss Lt ear after surgery, RA; h/o CVA involving cerebellum, HTN, idiopathic gout, OA hands and feet, DDD cervical and lumbar, stage 3 CKD, s/p bil. TKR's, peripheral neuropathy    Stability/Clinical Decision Making Evolving/Moderate complexity    Rehab Potential Good    PT Duration 8 weeks    PT Treatment/Interventions ADLs/Self Care Home Management;Aquatic Therapy;Cryotherapy;Electrical Stimulation;Moist  Heat;Iontophoresis 4mg /ml Dexamethasone;Traction;Neuromuscular re-education;Therapeutic exercise;Therapeutic activities;Patient/family education;Manual techniques;Dry needling;Taping;Functional mobility training  PT Next Visit Plan KX; check FOTO;  MMT; check progress toward goals with planned discharge to community based ex program next visit;   review HEP as needed    PT Home Exercise Plan Access Code: [email or web address]             Patient will benefit from skilled therapeutic intervention in order to improve the following deficits and impairments:     Visit Diagnosis: Acute bilateral low back pain, unspecified whether sciatica present  Muscle weakness (generalized)  Difficulty in walking, not elsewhere classified  Unsteadiness on feet     Problem List Patient Active Problem List   Diagnosis Date Noted   Obesity 08/19/2020   Proteinuria 08/19/2020   Vitamin D deficiency 02/11/2019   Stage 3 chronic kidney disease (HCC) 02/11/2019   Mild intermittent asthma without complication 02/11/2019   Primary osteoarthritis of both hands 11/24/2018   Primary osteoarthritis of both feet 11/24/2018   DDD (degenerative disc disease), cervical 11/24/2018   DDD (degenerative disc disease), lumbar 11/24/2018   History of stroke involving cerebellum 10/13/2018   Gait abnormality 10/13/2018   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   Asthma 08/12/2018   Hypothyroidism 08/12/2018   Rheumatoid arthritis (HCC) 07/04/2018   Essential hypertension 07/04/2018   Hyperlipidemia 07/04/2018   Hyperglycemia 05/28/2017   Seasonal allergic rhinitis 05/28/2017   Gouty arthropathy 09/15/2010   Osteoarthrosis, generalized, involving multiple sites 09/15/2010   Esophagitis 08/30/2010   Lavinia Sharps, PT 06/29/21 5:26 PM Phone: [phone] Fax: [phone]  Vivien Presto 06/29/2021, 5:25 PM  [facility] [address] [place], [place], [address] Phone: [phone]   Fax:  [phone]  Name: Alexis Shelton MRN: [id number] Date of Birth: 1941-07-08

## 2021-07-03 LAB — SURGICAL PATHOLOGY

## 2021-07-04 ENCOUNTER — Ambulatory Visit: Payer: Medicare Other | Admitting: Physical Therapy

## 2021-07-04 ENCOUNTER — Other Ambulatory Visit: Payer: Self-pay

## 2021-07-04 DIAGNOSIS — M545 Low back pain, unspecified: Secondary | ICD-10-CM | POA: Diagnosis not present

## 2021-07-04 DIAGNOSIS — R2681 Unsteadiness on feet: Secondary | ICD-10-CM

## 2021-07-04 DIAGNOSIS — R262 Difficulty in walking, not elsewhere classified: Secondary | ICD-10-CM

## 2021-07-04 DIAGNOSIS — M6281 Muscle weakness (generalized): Secondary | ICD-10-CM

## 2021-07-04 NOTE — Progress Notes (Signed)
Subjective: 80 year old female presents the office today for follow-up evaluation of symptomatic onychomycosis.  She has been using topical Penlac and has noticed some improvement in the color of the nail.  The nails are still thick and she cannot trim them himself.  Denies any swelling or redness or any drainage.  The nails do cause discomfort with pressure in with shoes at times of the nail plate and elongated.  Denies any systemic complaints such as fevers, chills, nausea, vomiting. No acute changes since last appointment, and no other complaints at this time.   Objective: AAO x3, NAD DP/PT pulses palpable bilaterally, CRT less than 3 seconds Nails continue be hypertrophic, dystrophic with yellow-brown discoloration at the distal portion.  There is simply on the proximal nail fold.  There is tenderness nails 1-5 bilaterally as they are elongated.  No edema, erythema or signs of infection.  No open lesions. No pain with calf compression, swelling, warmth, erythema  Assessment: Symptomatic onychomycosis  Plan: -All treatment options discussed with the patient including all alternatives, risks, complications.  -Nails sharply debrided x10 without any complications or bleeding.  Continue Penlac. -Patient encouraged to call the office with any questions, concerns, change in symptoms.   Vivi Barrack DPM

## 2021-07-04 NOTE — Therapy (Signed)
[facility] [address] [place], [place], [address] Phone: [phone]   Fax:  [phone]  Physical Therapy Treatment  Patient Details  Name: Marleigh Kaylor MRN: [id number] Date of Birth: 02/19/1941 Referring Provider (PT): Dr. Eunice Blase   Encounter Date: 07/04/2021   PT End of Session - 07/04/21 1726     Visit Number 23    Date for PT Re-Evaluation 07/04/21    Authorization Type Medicare/Aetna    Authorization Time Period KX at visit 15    Progress Note Due on Visit 66    PT Start Time 1530    PT Stop Time 1608    PT Time Calculation (min) 38 min             Past Medical History:  Diagnosis Date   Arthritis    DDD (degenerative disc disease), lumbar 11/24/2018   Dizziness    Gallstones    Hearing loss    History of stroke involving cerebellum 10/13/2018   Hypercholesteremia    Hypertension    Hypothyroidism    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   Migraines    Mild intermittent asthma without complication 7/41/6384   Primary osteoarthritis of both feet 11/24/2018   Rheumatoid arthritis ([place]) 07/04/2018   Stage 3 chronic kidney disease ([place]) 02/11/2019   Vitamin D deficiency 02/11/2019    Past Surgical History:  Procedure Laterality Date   ABDOMINAL HYSTERECTOMY     BREAST REDUCTION SURGERY Bilateral    CATARACT EXTRACTION, BILATERAL     INCISION AND DRAINAGE BREAST ABSCESS Left    REDUCTION MAMMAPLASTY     REPLACEMENT TOTAL KNEE Bilateral    TONSILLECTOMY AND ADENOIDECTOMY      There were no vitals filed for this visit.   Subjective Assessment - 07/04/21 1610     Subjective Feels that she has significantly improved. Plans to begin exercise program at [facility].    Pertinent History decreased hearing Lt ear after abdominal sx in 1990 - may have been a small stroke that caused hearing loss; (CVA was in the cerebellum);  RA, HTN, idiopathic gout, h/o CVA involving cerebellum,  OA hands & feet, DDD cervical & lumbar, stage 3 CKD, s/p bil. TKR's, mild peripheral neuropathy; history of vertigo; tried aquatic ex but didn't like    How long can you sit comfortably? feels better    How long can you stand comfortably? hard to cook b/c of hands;  I get tired when I'm on my feet a while    How long can you walk comfortably? with a cane 2 blocks; shopping cart around a store 1 hour    Diagnostic tests none for back    Patient Stated Goals get rid of pain; decreased reliance on medication; start walking program; sleep better; start an ex program--maybe at the gym    Currently in Pain? No/denies                [facility] PT Assessment - 07/04/21 0001       Assessment   Medical Diagnosis left hip pain; LBP    Referring Provider (PT) Dr. Legrand Como Hilts    Onset Date/Surgical Date --   January   Prior Therapy pelvic floor with [facility]; vestibular rehab      Precautions   Precautions Fall      Restrictions   Weight Bearing Restrictions No      Balance Screen   Has the patient fallen in the past 6 months  No    Has the patient had a decrease in activity level because of a fear of falling?  Yes    Is the patient reluctant to leave their home because of a fear of falling?  No      Home Environment   Living Environment Private residence    Living Arrangements Alone    Type of [place] Access Level entry    Home Layout One level      Prior Function   Level of Independence Independent    Vocation Retired      Observation/Other Assessments   Focus on Therapeutic Outcomes (FOTO)  59 (goal 68)      Strength   Right Hip Flexion 4/5    Right Hip Extension 4/5    Right Hip ABduction 4/5    Left Hip Flexion 4/5    Left Hip Extension 4/5    Left Hip ABduction 4/5                           [facility] Adult PT Treatment/Exercise - 07/04/21 0001       Knee/Hip Exercises: Standing   Abduction Limitations 3-way hip, yellow loop, x10 Lt/Rt     Other Standing Knee Exercises tband deadlift; green tband; x10 repetitions                    PT Education - 07/04/21 1611     Education Details 3-way hip; 2 x 10 reps Lt/Rt; yellow tband    Person(s) Educated Patient    Methods Explanation;Demonstration;Verbal cues;Handout    Comprehension Verbalized understanding;Returned demonstration;Verbal cues required              PT Short Term Goals - 05/31/21 1613       PT SHORT TERM GOAL #1   Title independent with initial HEP    Time 4    Period Weeks    Status Achieved    Target Date 04/11/21      PT SHORT TERM GOAL #2   Title The patient will report a 30% improvement in back pain at night, with standing and walking    Baseline leakage and not always aware of leakage, if more leakage not aware of it    Time 4    Period Weeks    Status Achieved    Target Date 04/26/20      PT SHORT TERM GOAL #3   Title The patient will have improved FOTO score to 60%    Baseline 56%    Time 4    Period Weeks    Status On-going    Target Date 04/26/20      PT SHORT TERM GOAL #4   Title Lumbar extension to 15 degrees and hip extension to 10 degrees needed for improved mobility for walking    Time 4    Period Weeks    Status Achieved   17 degrees   Target Date 04/26/20               PT Long Term Goals - 07/04/21 1725       PT LONG TERM GOAL #1   Title The patient will be independent in a safe self progression of a HEP and/or a gym program    Time 8    Period Weeks    Status Achieved      PT LONG TERM GOAL #2   Title The patient  will have lumbo/pelvic/hip strength grossly 4+/5 needed for walking/standing for longer periods of time with less discomfort    Time 8    Period Weeks    Status Not Met   4/5     PT LONG TERM GOAL #3   Title The patient will report a 60% improvement in back pain at night and with standing/walking    Time 8    Period Weeks    Status Achieved      PT LONG TERM GOAL #4   Title FOTO  functional outcome score improved to 68%    Time 8    Period Weeks    Status Not Met   59                  Plan - 07/04/21 1611     Clinical Impression Statement Patient is an 80 y/o female referred due to Lt hip and low back pain. She reports improved activity tolerance as she is able to complete daily activities without increased pain. Glute strength impairments continue to persist but this is expected to improve as patient transitions to community based exercise program. FOTO score improved indicating improved overall function. Patient has not further needs that require skilled therapeutic intervention. Safe to D/C to HEP.    Personal Factors and Comorbidities Fitness;Age;Time since onset of injury/illness/exacerbation;Comorbidity 2    Comorbidities abdominal hysterectomy with hearing loss Lt ear after surgery, RA; h/o CVA involving cerebellum, HTN, idiopathic gout, OA hands and feet, DDD cervical and lumbar, stage 3 CKD, s/p bil. TKR's, peripheral neuropathy    Examination-Activity Limitations Hygiene/Grooming;Locomotion Level;Transfers;Bed Mobility;Bend;Stand    Examination-Participation Restrictions Community Activity;Cleaning;Meal Prep;Shop;Laundry    Rehab Potential Good    PT Frequency 2x / week    PT Duration 8 weeks    PT Treatment/Interventions ADLs/Self Care Home Management;Aquatic Therapy;Cryotherapy;Electrical Stimulation;Moist Heat;Iontophoresis 61m/ml Dexamethasone;Traction;Neuromuscular re-education;Therapeutic exercise;Therapeutic activities;Patient/family education;Manual techniques;Dry needling;Taping;Functional mobility training    PT Next Visit Plan D/C to HEP    PT Home Exercise Plan Access Code: [email or web address]   Consulted and Agree with Plan of Care Patient             Patient will benefit from skilled therapeutic intervention in order to improve the following deficits and impairments:  Dizziness, Difficulty walking, Decreased balance, Impaired  sensation  Visit Diagnosis: Acute bilateral low back pain, unspecified whether sciatica present  Muscle weakness (generalized)  Difficulty in walking, not elsewhere classified  Unsteadiness on feet     Problem List Patient Active Problem List   Diagnosis Date Noted   Obesity 08/19/2020   Proteinuria 08/19/2020   Vitamin D deficiency 02/11/2019   Stage 3 chronic kidney disease ([place] 02/11/2019   Mild intermittent asthma without complication 099/24/2683  Primary osteoarthritis of both hands 11/24/2018   Primary osteoarthritis of both feet 11/24/2018   DDD (degenerative disc disease), cervical 11/24/2018   DDD (degenerative disc disease), lumbar 11/24/2018   History of stroke involving cerebellum 10/13/2018   Gait abnormality 10/13/2018   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   Asthma 08/12/2018   Hypothyroidism 08/12/2018   Rheumatoid arthritis ([place] 07/04/2018   Essential hypertension 07/04/2018   Hyperlipidemia 07/04/2018   Hyperglycemia 05/28/2017   Seasonal allergic rhinitis 05/28/2017   Gouty arthropathy 09/15/2010   Osteoarthrosis, generalized, involving multiple sites 09/15/2010   Esophagitis 08/30/2010    PHYSICAL THERAPY DISCHARGE SUMMARY  Visits from Start of Care: 23  Current functional level related to goals / functional outcomes: See above  Remaining deficits: See above   Education / Equipment: See above   Patient agrees to discharge. Patient goals were partially met. Patient is being discharged due to being pleased with the current functional level.  Everardo All PT, DPT  07/04/21 5:28 PM   [facility] [address] [place], [place], [address] Phone: [phone]   Fax:  [phone]  Name: Ariannie Penaloza MRN: [id number] Date of Birth: [birth year]

## 2021-07-04 NOTE — Patient Instructions (Signed)
3-way hip; 2 x 10 reps Lt/Rt; yellow tband

## 2021-07-05 NOTE — Progress Notes (Signed)
Pt arrived for initial intake assessment for the PREP class at [facility] starting next week. Upon further discussion, she asked if there were any classes available that started later in the day. Advised that next PREP class at [facility] in August was occurring on M/W at 1pm, she prefers to attend that session. Contacted Viann Fish, HC at [facility], she will contact [name] to set up visit for those classes next month.

## 2021-07-06 ENCOUNTER — Other Ambulatory Visit: Payer: Self-pay | Admitting: Family Medicine

## 2021-07-07 ENCOUNTER — Telehealth: Payer: Self-pay

## 2021-07-07 NOTE — Telephone Encounter (Signed)
Call to patient to confirm interest in PREP at [facility]. Can do a start of 08/07/21 M/W 230p-345p Will contact her closer to start of class to complete intake.

## 2021-07-28 ENCOUNTER — Telehealth: Payer: Self-pay

## 2021-07-28 ENCOUNTER — Encounter: Payer: Self-pay | Admitting: Internal Medicine

## 2021-07-28 NOTE — Telephone Encounter (Signed)
Call placed to pt to confirm start of PREP on 08/07/21.  Unable to sched intake as she doesn't have her schedule for next week yet. Will call back next week.

## 2021-08-01 ENCOUNTER — Ambulatory Visit
Admission: RE | Admit: 2021-08-01 | Discharge: 2021-08-01 | Disposition: A | Discharge status: Home / Self Care | Payer: Medicare Other | Source: Ambulatory Visit | Attending: Obstetrics and Gynecology | Admitting: Obstetrics and Gynecology

## 2021-08-01 ENCOUNTER — Other Ambulatory Visit: Payer: Self-pay

## 2021-08-01 DIAGNOSIS — Z1231 Encounter for screening mammogram for malignant neoplasm of breast: Secondary | ICD-10-CM | POA: Diagnosis not present

## 2021-08-09 ENCOUNTER — Ambulatory Visit (INDEPENDENT_AMBULATORY_CARE_PROVIDER_SITE_OTHER): Payer: Medicare Other | Admitting: Family Medicine

## 2021-08-09 ENCOUNTER — Other Ambulatory Visit: Payer: Self-pay

## 2021-08-09 ENCOUNTER — Encounter (HOSPITAL_BASED_OUTPATIENT_CLINIC_OR_DEPARTMENT_OTHER): Payer: Self-pay | Admitting: Family Medicine

## 2021-08-09 VITALS — BP 138/70 | HR 74 | Ht 66.5 in | Wt 178.0 lb

## 2021-08-09 DIAGNOSIS — G4700 Insomnia, unspecified: Secondary | ICD-10-CM | POA: Diagnosis not present

## 2021-08-09 DIAGNOSIS — S01312A Laceration without foreign body of left ear, initial encounter: Secondary | ICD-10-CM | POA: Insufficient documentation

## 2021-08-09 MED ORDER — TRAMADOL HCL 50 MG PO TABS: 50.0000 mg | ORAL_TABLET | Freq: Four times a day (QID) | ORAL | 0 refills | Status: DC | PRN

## 2021-08-09 NOTE — Assessment & Plan Note (Signed)
Discussed treatment options with patient Will refer patient to Dr. Bosie Clos for further evaluation, CBT Plan for follow-up in about 2 to 3 months to monitor progress

## 2021-08-09 NOTE — Assessment & Plan Note (Signed)
Small wound noted as above on exam Pooled fresh blood was cleaned away with sterile Q-tip Discussed proper wound care with patient, keep area clean and dry, may use warm soapy water to clean, if bleeding, may hold pressure over the wound with gauze Advised on precautions if bleeding is persistent or if signs of infection occur

## 2021-08-09 NOTE — Progress Notes (Signed)
New Patient Office Visit  Subjective:  Patient ID: Alexis Shelton, female    DOB: 02/21/1941  Age: 80 y.o. MRN: [id number]  CC:  Chief Complaint  Patient presents with   Establish Care    Prior PCP - Dr Prince Rome. Patient has no specific health concerns other then the generally establish care. She states she had labs about 6 months ago with Dr. Prince Rome   Hot Flashes    Patient states she has intermittent hot flashes with no specific causation. Patient states she had a hysterectomy years ago.    Medication Refill    Patient is requesting refill on tramadol    HPI Alexis Shelton is an 80 year old female presenting to establish clinic.  She has current concerns today related to insomnia, left ear injury, intermittent hot flashes.  Past medical history significant for gout, dyslipidemia, hypothyroidism, chronic pain.  Insomnia: This is a relatively new issue for patient, reports in the past she has not had much trouble in regards to sleeping.  More recently she has had trouble with initiating and maintaining sleep.  Attributes this to various life stressors, reports that her son has bipolar disorder and due to some of his life circumstances this is led to increased stress in her life.  She reports that she has not interested in any medications in regards to treatment but would like to know what options are available.  Left ear: Reports that this morning she fell out of bed and hit her left ear and subsequently had some bleeding.  Bleeding was somewhat diffuse as she does take daily baby aspirin.  Has some pain over pinna, bleeding has mostly subsided.  She did use some peroxide on the area to clean it.  Hot flashes: Has OCs for about 1 month.  These will occur about 4-5 times per week and will last for seconds when they do occur.  Uncertain of any inciting factors.  Does not believe it is related to any hormonal issues.  Gout: Has been managed with allopurinol, taking 100 mg daily.  No  recent changes to this dose.  No recent flares.  Dyslipidemia: Has been taking atorvastatin for this, denies any issues, no muscle aches or myalgias.  Hypothyroidism: Currently taking levothyroxine 100 mcg, has been tolerating this medication well, most recent thyroid studies earlier this year were within normal limits.  Indicates having chronic pain, primarily related to diffuse joint pain.  She has been managed on tramadol, primarily takes 1 tablet in the morning most days.  Requesting refill of this today.  Past Medical History:  Diagnosis Date   Arthritis    DDD (degenerative disc disease), lumbar 11/24/2018   Dizziness    Gallstones    Hearing loss    History of stroke involving cerebellum 10/13/2018   Hypercholesteremia    Hypertension    Hypothyroidism    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   Migraines    Mild intermittent asthma without complication 02/11/2019   Primary osteoarthritis of both feet 11/24/2018   Rheumatoid arthritis ([facility]) 07/04/2018   Stage 3 chronic kidney disease ([facility]) 02/11/2019   Vitamin D deficiency 02/11/2019    Past Surgical History:  Procedure Laterality Date   ABDOMINAL HYSTERECTOMY     BREAST REDUCTION SURGERY Bilateral    CATARACT EXTRACTION, BILATERAL     INCISION AND DRAINAGE BREAST ABSCESS Left    REDUCTION MAMMAPLASTY     REPLACEMENT TOTAL KNEE Bilateral    TONSILLECTOMY AND ADENOIDECTOMY  Family History  Problem Relation Age of Onset   Stroke Mother    Hyperlipidemia Mother    Arthritis Mother    Heart disease Father    Arthritis Sister    Bipolar disorder Son    Healthy Son    Gallbladder disease Maternal Grandmother        radiation   GER disease Maternal Grandmother    Heart disease Paternal Uncle        x 7   Colon cancer Neg Hx    Esophageal cancer Neg Hx    Rectal cancer Neg Hx    Stomach cancer Neg Hx    Cancer Neg Hx     Social History   Socioeconomic History   Marital status: Widowed    Spouse name:  Not on file   Number of children: 1   Years of education: MD   Highest education level: Not on file  Occupational History   Occupation: Retired - Sales promotion account executive    Comment: [employer]  Tobacco Use   Smoking status: Former    Types: Cigarettes    Quit date: 1970    Years since quitting: 52.6   Smokeless tobacco: Never  Vaping Use   Vaping Use: Never used  Substance and Sexual Activity   Alcohol use: Not Currently   Drug use: Never   Sexual activity: Not Currently  Other Topics Concern   Not on file  Social History Narrative   Lives alone.  The patient is widowed.   She worked at the [employer] in [place], MD scientist, and then moved to [place] and moved to [place] where her son is a International aid/development worker, after her husband died   Right-handed.   1 cup coffee per day.   No alcohol   1 son as above   Former smoker   Investment banker, operational: Not on file  Food Insecurity: Not on file  Transportation Needs: Not on file  Physical Activity: Not on file  Stress: Not on file  Social Connections: Not on file  Intimate Partner Violence: Not on file    Objective:   Today's Vitals: BP 138/70   Pulse 74   Ht 5' 6.5" (1.689 m)   Wt 178 lb (80.7 kg)   SpO2 94%   BMI 28.30 kg/m   Physical Exam  80 year old female in no acute distress Cardiovascular exam with regular rate and rhythm, no murmurs appreciated Lungs clear to auscultation bilaterally HEENT: Left ear exam reveals areas of dried blood with some pooling of fresh blood within the ear.  There is a small defect in the skin where it appears shearing force was led to separation of the skin.  No significant active bleeding presently.  Examination of auditory canal, tympanic membrane is normal.  Assessment & Plan:   Problem List Items Addressed This Visit       Nervous and Auditory   Laceration of auricle of ear, left, initial encounter    Small wound noted as above on exam Pooled fresh blood  was cleaned away with sterile Q-tip Discussed proper wound care with patient, keep area clean and dry, may use warm soapy water to clean, if bleeding, may hold pressure over the wound with gauze Advised on precautions if bleeding is persistent or if signs of infection occur        Other   Insomnia - Primary    Discussed treatment options with patient Will refer patient to Dr. Bosie Clos for further  evaluation, CBT Plan for follow-up in about 2 to 3 months to monitor progress      Relevant Orders   Ambulatory referral to Psychology    Outpatient Encounter Medications as of 08/09/2021  Medication Sig   albuterol (VENTOLIN HFA) 108 (90 Base) MCG/ACT inhaler Inhale 2 puffs into the lungs every 6 (six) hours as needed for wheezing or shortness of breath. (Patient taking differently: Inhale 2 puffs into the lungs as needed for wheezing or shortness of breath.)   allopurinol (ZYLOPRIM) 100 MG tablet TAKE 1 TABLET BY MOUTH EVERY DAY   aspirin 81 MG EC tablet Take 81 mg by mouth daily. Swallow whole.   atorvastatin (LIPITOR) 10 MG tablet TAKE 1 TABLET BY MOUTH EVERY DAY   atorvastatin (LIPITOR) 20 MG tablet Take 1 tablet (20 mg total) by mouth daily.   betamethasone valerate ointment (VALISONE) 0.1 % Place in a thin lay to affect area of the vulva twice daily for 2 weeks and then apply in a thin layer to the vulva twice a week at bedtime for maintenance.   budesonide-formoterol (SYMBICORT) 160-4.5 MCG/ACT inhaler Inhale 2 puffs into the lungs as needed.   ciclopirox (PENLAC) 8 % solution Apply topically at bedtime. Apply over nail and surrounding skin. Apply daily over previous coat. After seven (7) days, may remove with alcohol and continue cycle.   Clobetasol Propionate 0.05 % shampoo Apply 1 application topically 2 (two) times daily.   Cyanocobalamin ([employer] VITAMIN B-12) 5000 MCG SUBL Place under the tongue daily.   diclofenac Sodium (VOLTAREN) 1 % GEL Apply 4 g topically 4 (four) times daily as  needed.   fluconazole (DIFLUCAN) 150 MG tablet TAKE 1 TABLET BY MOUTH EVERY 3 DAYS.   furosemide (LASIX) 20 MG tablet TAKE 1 TABLET BY MOUTH EVERY DAY   ketoconazole (NIZORAL) 2 % shampoo Apply 1 application topically 2 (two) times a week.   levothyroxine (SYNTHROID) 100 MCG tablet TAKE 1 TABLET BY MOUTH EVERY DAY BEFORE BREAKFAST   metoprolol tartrate (LOPRESSOR) 25 MG tablet TAKE 1 TABLET BY MOUTH TWICE A DAY   montelukast (SINGULAIR) 10 MG tablet TAKE 1 TABLET BY MOUTH EVERYDAY AT BEDTIME   Multiple Vitamin (MULTIVITAMIN) tablet Take 1 tablet by mouth daily.   omeprazole (PRILOSEC) 40 MG capsule TAKE 1 CAPSULE (40 MG TOTAL) BY MOUTH DAILY 30 MINS BEFORE BREAKFAST   [DISCONTINUED] traMADol (ULTRAM) 50 MG tablet TAKE 1 TABLET BY MOUTH EVERY 6 HOURS AS NEEDED   traMADol (ULTRAM) 50 MG tablet Take 1 tablet (50 mg total) by mouth every 6 (six) hours as needed.   No facility-administered encounter medications on file as of 08/09/2021.   Spent 45 minutes on this patient encounter, including preparation, chart review, face-to-face counseling with patient and coordination of care, and documentation of encounter  Follow-up: Return in about 2 months (around 10/09/2021) for Follow Up.  At next follow-up, plan to check labs including hemoglobin A1c, TSH.  Blakelee Allington J De Peru, MD

## 2021-08-09 NOTE — Patient Instructions (Signed)
  Medication Instructions:  Your physician recommends that you continue on your current medications as directed. Please refer to the Current Medication list given to you today. --If you need a refill on any your medications before your next appointment, please call your pharmacy first. If no refills are authorized on file call the office.--  Lab Work: Your physician has recommended that you have lab work today:  If you have labs (blood work) drawn today and your tests are completely normal, you will receive your results via MyChart message OR a phone call from our staff.  Please ensure you check your voicemail in the event that you authorized detailed messages to be left on a delegated number. If you have any lab test that is abnormal or we need to change your treatment, we will call you to review the results.  Referrals/Procedures/Imaging: A referral has been placed for you to Dr. Bosie Clos with [facility]. Dr. Bosie Clos is a psychologist who specializes in cognitive and behavioral therapy, he does not prescribe medications. Someone from the scheduling department will be in contact with you in regards to coordinating your consultation. If you do not hear from any of the schedulers within 7-10 business days please give our office a call  Follow-Up: Your next appointment:   Your physician recommends that you schedule a follow-up appointment in: 2-3 MONTHS with Dr. de Peru  Thanks for letting us be apart of your health journey!!  Primary Care and Sports Medicine   Dr. Ceasar Mons Peru   We encourage you to activate your patient portal called "MyChart".  Sign up information is provided on this After Visit Summary.  MyChart is used to connect with patients for Virtual Visits (Telemedicine).  Patients are able to view lab/test results, encounter notes, upcoming appointments, etc.  Non-urgent messages can be sent to your provider as well. To learn more about what you can do with MyChart,  please visit --  [email or web address].

## 2021-08-11 ENCOUNTER — Telehealth: Payer: Self-pay

## 2021-08-11 NOTE — Telephone Encounter (Signed)
Received [facility] pt. Still interested in participating in PREP however still doesn't know when she will be working. Can't do this latest class wants to be called for a future one.  Returned call to pt. LVMT will call back in future for PREP class

## 2021-08-31 ENCOUNTER — Other Ambulatory Visit: Payer: Self-pay

## 2021-08-31 ENCOUNTER — Ambulatory Visit (INDEPENDENT_AMBULATORY_CARE_PROVIDER_SITE_OTHER): Payer: Medicare Other | Admitting: Podiatry

## 2021-08-31 DIAGNOSIS — M79674 Pain in right toe(s): Secondary | ICD-10-CM | POA: Diagnosis not present

## 2021-08-31 DIAGNOSIS — M79675 Pain in left toe(s): Secondary | ICD-10-CM

## 2021-08-31 DIAGNOSIS — B351 Tinea unguium: Secondary | ICD-10-CM

## 2021-09-06 NOTE — Progress Notes (Signed)
Subjective: 80 year old female presents the office today for follow-up evaluation of symptomatic onychomycosis.  She has been using topical Penlac she has noticed some improvement in color the nails are still thickened discolored and she cannot trim them herself causing discomfort.  She denies any swelling or redness to the toenail sites.    Objective: AAO x3, NAD DP/PT pulses palpable bilaterally, CRT less than 3 seconds Nails continue be hypertrophic, dystrophic with yellow-brown discoloration at the distal portion.  There is some mild clearing noted on the proximal nail fold.  There is tenderness nails 1-5 bilaterally as they are elongated.  No edema, erythema or signs of infection.  No open lesions. No pain with calf compression, swelling, warmth, erythema  Assessment: Symptomatic onychomycosis  Plan: -All treatment options discussed with the patient including all alternatives, risks, complications.  -Nails sharply debrided x10 without any complications or bleeding.  Continue Penlac. -Patient encouraged to call the office with any questions, concerns, change in symptoms.   Vivi Barrack DPM

## 2021-09-07 ENCOUNTER — Telehealth: Payer: Self-pay

## 2021-09-07 NOTE — Telephone Encounter (Signed)
Call to pt to see if she wanted to participate in PREP at [facility] starting Oct 3rd. Still unsure of work schedule, would like to participate however. Will note to call her for future classes and encouraged her to call when she was able to participate.

## 2021-09-20 ENCOUNTER — Encounter (INDEPENDENT_AMBULATORY_CARE_PROVIDER_SITE_OTHER): Payer: Self-pay

## 2021-09-21 ENCOUNTER — Ambulatory Visit (INDEPENDENT_AMBULATORY_CARE_PROVIDER_SITE_OTHER): Payer: Medicare Other | Admitting: Neurology

## 2021-09-21 ENCOUNTER — Encounter: Payer: Self-pay | Admitting: Neurology

## 2021-09-21 VITALS — BP 153/74 | HR 75 | Ht 66.5 in | Wt 178.5 lb

## 2021-09-21 DIAGNOSIS — G119 Hereditary ataxia, unspecified: Secondary | ICD-10-CM

## 2021-09-21 DIAGNOSIS — R269 Unspecified abnormalities of gait and mobility: Secondary | ICD-10-CM

## 2021-09-21 NOTE — Progress Notes (Signed)
Chief Complaint  Patient presents with   Follow-up    Room 12 - alone. Last seen 01/26/2019. Hx of CVA. Reports worsening gait. Using a cane to assist with ambulation. She has also noticed increased weakness in her hands - drops things often. She lives alone. She has a son who helps but is bipolar.       ASSESSMENT AND PLAN  Alexis Shelton is a 80 y.o. 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   MEDICAL HISTORY: Alexis Shelton is a 80 year old female, seen in request by her primary care physician Alexis Shelton, for evaluation of imbalance, initial evaluation was on October 13, 2018.   I have reviewed and summarized the referring note from the referring physician.  She had a past medical history of hypertension bilateral knee replacement, cataract surgery, hyperlipidemia, hypothyroidism on supplement,   She had a history of chronic migraine in the past, usually triggered by stress, especially the period of time after extreme stress, she would get lateralized pounding headaches, often preceded by visual aura, she only has a few times each year, occasionally woke up with her typical migraine, she took  tramadol as needed for pain,   She also had a history of rheumatoid arthritis, bilateral knee replacement, this was done at [place], left side in June 2018, right knee was February 2019, she recovered well post surgically, since May 2019, she noticed gradual onset of unsteady gait, first noticed that when she got up in the middle of the night using bathroom, she has to hold walls to steady herself.   She only noticed unsteady sensation when she bear weight, walking, denies dizziness in sitting down or lying down position, she denies vertigo,   20 years ago, she woke up from general anesthesia for her hysterectomy, she had a sudden onset left hearing loss, has been persistent that way.   She recently moved from [place] to [place], during the past 10 years, she traveled extensively in cruise trip, she complains of seasickness sometimes, she also drinks up to 8 ounces of wine frequently with her friend.   We personally reviewed MRI of the brain August 2019, remote cerebellar infarction, mild periventricular white matter disease,   There is no orthostatic blood pressure demonstrated today, she does complains of mild bilateral toes numbness. Lying: 138/73, 61, Sitting: 160/86, 62, Standing: 134/66, 67,  Standing x 3 minutes: 148/70, 66.  Reports intermittent dizziness worsening since May 2019.  Symptoms not always related to positional changes.    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,   We reviewed the laboratory evaluation, elevated A1c 6.1, low vitamin D 21, rest of the laboratory evaluation showed normal or negative protein electrophoresis, CBC, ESR, C-reactive protein, RPR, B12, copper, ANA   Today she also reported long history of bowel and bladder incontinence, she could not feel her defecation, she denies significant neck or low back pain,   UPDATE Jan 26 2019: She continue  has mild unsteady gait, also complains of difficulty sleeping, often woke up in the morning time felt nausea,   We personally reviewed MRI of the brain August 2019, no cerebellar infarction, no periventricular small vessel disease, superior cerebellum atrophy MRI of cervical spine November 2019, multilevel degenerative changes, no significant canal or foraminal narrowing MRI of lumbar: Multilevel degenerative changes, most prominent at L5-S1, left side severe foraminal narrowing,   EMG nerve conduction study October 2019 showed mild axonal sensorimotor polyneuropathy,   UPDATE Sept 29 2022 She returns today, worry about her slow worsening gait abnormality, continue has mild bilateral feet numbness, obesity, also reported excessive stress, not exercise as much as she should,  We again personally reviewed MRI of the brain, in August 2019, no acute abnormality, remote right cerebellum small infarction, mild supratentorium small vessel disease, apparent superior vermis atrophy, did have  more than 10 years history of excessive alcohol intake MRI of cervical spine, prominent spondylitic changes, no significant canal or foraminal narrowing MRI of lumbar spine, prominent spondylitic changes, left side severe foraminal stenosis L5-S1, no significant canal stenosis,    PHYSICAL EXAM:   Vitals:   09/21/21 1014  BP: (!) 153/74  Pulse: 75  Weight: 178 lb 8 oz (81 kg)  Height: 5' 6.5" (1.689 m)   Not recorded     Body mass index is 28.38 kg/m.  PHYSICAL EXAMNIATION:  Gen: NAD, conversant, well nourised, well groomed             NEUROLOGICAL EXAM:  MENTAL STATUS: Speech/cognition: Awake, alert, oriented to history taking and casual conversation   CRANIAL NERVES: CN II: Visual fields are full to confrontation. Pupils are round equal and briskly reactive to light. CN III, IV, VI: extraocular movement are normal. No ptosis. CN V: Facial sensation is intact to light touch CN VII: Face is  symmetric with normal eye closure  CN VIII: Hearing is normal to causal conversation. CN IX, X: Phonation is normal. CN XI: Head turning and shoulder shrug are intact  MOTOR: Normal muscle tone, bulk, strength  REFLEXES: Reflexes are 1 and symmetric at the biceps, triceps, knees, and absent at ankles. Plantar responses are flexor.  SENSORY: Length dependent decreased to light touch, pinprick and vibratory sensation to ankle level  COORDINATION: No limb dysmetria, but there is evidence of truncal ataxia  GAIT/STANCE: She needs push-up to get up from seated position, wide-based, unsteady, cautious,  REVIEW OF SYSTEMS:  Full 14 system review of systems performed and notable only for as above All other review of systems were negative.   ALLERGIES: Allergies  Allergen Reactions   Other     Wood - burning fireplaces    HOME MEDICATIONS: Current Outpatient Medications  Medication Sig Dispense Refill   albuterol (VENTOLIN HFA) 108 (90 Base) MCG/ACT inhaler Inhale 2 puffs into  the lungs every 6 (six) hours as needed for wheezing or shortness of breath. (Patient taking differently: Inhale 2 puffs into the lungs as needed for wheezing or shortness of breath.) 54 g 3   allopurinol (ZYLOPRIM) 100 MG tablet TAKE 1 TABLET BY MOUTH EVERY DAY 90 tablet 3   aspirin 81 MG EC tablet Take 81 mg by mouth daily. Swallow whole.     atorvastatin (LIPITOR) 10 MG tablet TAKE 1 TABLET BY MOUTH EVERY DAY 90 tablet 3   betamethasone valerate ointment (VALISONE) 0.1 % Place in a thin lay to affect area of the vulva twice daily for 2 weeks and then apply in a thin layer to the vulva twice a week at bedtime for maintenance. 45 g 0   budesonide-formoterol (SYMBICORT) 160-4.5 MCG/ACT inhaler Inhale 2 puffs into the lungs as needed. 30.6 g 3   ciclopirox (PENLAC) 8 % solution Apply topically at bedtime. Apply over nail and surrounding skin. Apply daily over previous coat. After seven (7) days, may remove with  alcohol and continue cycle. 6.6 mL 4   Clobetasol Propionate 0.05 % shampoo Apply 1 application topically 2 (two) times daily. 118 mL 6   Cyanocobalamin ([employer] VITAMIN B-12) 5000 MCG SUBL Place under the tongue daily.     fluconazole (DIFLUCAN) 150 MG tablet TAKE 1 TABLET BY MOUTH EVERY 3 DAYS. 5 tablet 0   ketoconazole (NIZORAL) 2 % shampoo Apply 1 application topically 2 (two) times a week. 120 mL 11   levothyroxine (SYNTHROID) 100 MCG tablet TAKE 1 TABLET BY MOUTH EVERY DAY BEFORE BREAKFAST 90 tablet 1   metoprolol tartrate (LOPRESSOR) 25 MG tablet TAKE 1 TABLET BY MOUTH TWICE A DAY 180 tablet 2   montelukast (SINGULAIR) 10 MG tablet TAKE 1 TABLET BY MOUTH EVERYDAY AT BEDTIME 90 tablet 3   Multiple Vitamin (MULTIVITAMIN) tablet Take 1 tablet by mouth daily.     omeprazole (PRILOSEC) 40 MG capsule TAKE 1 CAPSULE (40 MG TOTAL) BY MOUTH DAILY 30 MINS BEFORE BREAKFAST 90 capsule 3   traMADol (ULTRAM) 50 MG tablet Take 1 tablet (50 mg total) by mouth every 6 (six) hours as needed. 90 tablet 0   No current facility-administered medications for this visit.    PAST MEDICAL HISTORY: Past Medical History:  Diagnosis Date   Arthritis    DDD (degenerative disc disease), lumbar 11/24/2018   Dizziness    Gallstones    Hearing loss    History of stroke involving cerebellum 10/13/2018   Hypercholesteremia    Hypertension    Hypothyroidism    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   Migraines    Mild intermittent asthma without complication 4/56/2563   Primary osteoarthritis of both feet 11/24/2018   Rheumatoid arthritis ([place]) 07/04/2018   Stage 3 chronic kidney disease ([place]) 02/11/2019   Vitamin D deficiency 02/11/2019    PAST SURGICAL HISTORY: Past Surgical History:  Procedure Laterality Date   ABDOMINAL HYSTERECTOMY     BREAST REDUCTION SURGERY Bilateral    CATARACT EXTRACTION, BILATERAL     INCISION AND DRAINAGE BREAST ABSCESS Left    REDUCTION MAMMAPLASTY     REPLACEMENT TOTAL KNEE  Bilateral    TONSILLECTOMY AND ADENOIDECTOMY      FAMILY HISTORY: Family History  Problem Relation Age of Onset   Stroke Mother    Hyperlipidemia Mother    Arthritis Mother    Heart disease Father    Arthritis Sister    Bipolar disorder Son    Healthy Son  Gallbladder disease Maternal Grandmother        radiation   GER disease Maternal Grandmother    Heart disease Paternal Uncle        x 7   Colon cancer Neg Hx    Esophageal cancer Neg Hx    Rectal cancer Neg Hx    Stomach cancer Neg Hx    Cancer Neg Hx     SOCIAL HISTORY: Social History   Socioeconomic History   Marital status: Widowed    Spouse name: Not on file   Number of children: 1   Years of education: MD   Highest education level: Not on file  Occupational History   Occupation: Retired - Animator    Comment: [employer]  Tobacco Use   Smoking status: Former    Types: Cigarettes    Quit date: 1970    Years since quitting: 52.7   Smokeless tobacco: Never  Vaping Use   Vaping Use: Never used  Substance and Sexual Activity   Alcohol use: Not Currently   Drug use: Never   Sexual activity: Not Currently  Other Topics Concern   Not on file  Social History Narrative   Lives alone.  The patient is widowed.   She worked at the [employer] in [place], MD scientist, and then moved to [place] and moved to [place] where her son is a Animal nutritionist, after her husband died   Right-handed.   1 cup coffee per day.   No alcohol   1 son as above   Former smoker   Scientist, physiological Strain: Not on file  Food Insecurity: Not on file  Transportation Needs: Not on file  Physical Activity: Not on file  Stress: Not on file  Social Connections: Not on file  Intimate Partner Violence: Not on file      Marcial Pacas, M.D. Ph.D.  [facility] [address], [place], [place] [address] Ph: [phone] Fax: [phone]  CC:  [place], Blondell Reveal, MD [place],  [place] [address]  [place], Blondell Reveal, MD

## 2021-09-21 NOTE — Patient Instructions (Signed)
  [facility]  Address: [address], [place], [place] [address]  Phone: [phone]

## 2021-09-22 ENCOUNTER — Encounter: Payer: Self-pay | Admitting: Neurology

## 2021-09-29 ENCOUNTER — Other Ambulatory Visit: Payer: Self-pay

## 2021-09-29 ENCOUNTER — Ambulatory Visit (INDEPENDENT_AMBULATORY_CARE_PROVIDER_SITE_OTHER): Payer: Medicare Other | Admitting: Psychologist

## 2021-09-29 DIAGNOSIS — F321 Major depressive disorder, single episode, moderate: Secondary | ICD-10-CM

## 2021-10-02 ENCOUNTER — Other Ambulatory Visit: Payer: Self-pay | Admitting: Family Medicine

## 2021-10-09 ENCOUNTER — Other Ambulatory Visit: Payer: Self-pay

## 2021-10-09 ENCOUNTER — Ambulatory Visit (INDEPENDENT_AMBULATORY_CARE_PROVIDER_SITE_OTHER): Payer: Medicare Other | Admitting: Family Medicine

## 2021-10-09 ENCOUNTER — Encounter (HOSPITAL_BASED_OUTPATIENT_CLINIC_OR_DEPARTMENT_OTHER): Payer: Self-pay | Admitting: Family Medicine

## 2021-10-09 VITALS — BP 140/70 | HR 60 | Ht 66.0 in | Wt 177.0 lb

## 2021-10-09 DIAGNOSIS — M5136 Other intervertebral disc degeneration, lumbar region: Secondary | ICD-10-CM | POA: Diagnosis not present

## 2021-10-09 DIAGNOSIS — Z23 Encounter for immunization: Secondary | ICD-10-CM | POA: Diagnosis not present

## 2021-10-09 DIAGNOSIS — Z131 Encounter for screening for diabetes mellitus: Secondary | ICD-10-CM

## 2021-10-09 DIAGNOSIS — G4700 Insomnia, unspecified: Secondary | ICD-10-CM | POA: Diagnosis not present

## 2021-10-09 DIAGNOSIS — R7303 Prediabetes: Secondary | ICD-10-CM

## 2021-10-09 DIAGNOSIS — E039 Hypothyroidism, unspecified: Secondary | ICD-10-CM

## 2021-10-09 DIAGNOSIS — M51369 Other intervertebral disc degeneration, lumbar region without mention of lumbar back pain or lower extremity pain: Secondary | ICD-10-CM

## 2021-10-09 LAB — POCT GLYCOSYLATED HEMOGLOBIN (HGB A1C)
HbA1c POC (<> result, manual entry): 6 % (ref 4.0–5.6)
Hemoglobin A1C: 6 % — AB (ref 4.0–5.6)

## 2021-10-09 MED ORDER — LEVOTHYROXINE SODIUM 100 MCG PO TABS: ORAL_TABLET | ORAL | 1 refills | Status: DC

## 2021-10-09 NOTE — Progress Notes (Signed)
    Procedures performed today:    None.  Independent interpretation of notes and tests performed by another provider:   None.  Brief History, Exam, Impression, and Recommendations:    BP 140/70   Pulse 60   Ht 5\' 6"  (1.676 m)   Wt 177 lb (80.3 kg)   SpO2 99%   BMI 28.57 kg/m   Prediabetes Noted on prior labs, most recently in March with finding of 5.7% Will recheck A1c today  Insomnia Has been working with Dr. April, only 1 session so far, has upcoming visits scheduled Sleep issues mostly unchanged since last visit She does report that she will have some irregularity to her sleep schedule where some nights she will go to bed earlier and some nights she will stay up later reading -primarily depends on what she is doing the next day such as if she is watching her grandson play soccer on the weekend Discussed appropriate sleep hygiene measures, provided handout Discussed risk associated with certain pharmacologic sleep aids and that continuing with CBT for treatment of insomnia is recommended  Hypothyroidism TSH last checked in March, was normal at that time Will repeat TSH today, adjust dose of levothyroxine as indicated  DDD (degenerative disc disease), lumbar Chronic intermittent low back pain due to known degenerative disc disease Has worked with physical therapy in the past, was provided with home exercise program Currently has some pain, will utilize Tylenol as needed to help with pain, tramadol occasionally if having more significant pain Discussed options at present with patient, she plans to start working out at fitness center downstairs, will work on home exercise program that she was provided with previously Offered referral to physical therapy, she wishes to hold off on this for right now  Need for influenza vaccination Patient needing influenza vaccination today and is interested Influenza vaccine administered  Need for tetanus booster Indicates that it  has been many years since her last tetanus shot, interested in receiving booster today Tetanus booster administered in office  Plan for follow-up in about 2 to 3 months, primarily to follow-up on insomnia, back pain   ___________________________________________ Batoul Limes de April, MD, ABFM, [facility] Primary [facility] and Sports [facility]

## 2021-10-09 NOTE — Assessment & Plan Note (Signed)
Has been working with Dr. Bosie Clos, only 1 session so far, has upcoming visits scheduled Sleep issues mostly unchanged since last visit She does report that she will have some irregularity to her sleep schedule where some nights she will go to bed earlier and some nights she will stay up later reading -primarily depends on what she is doing the next day such as if she is watching her grandson play soccer on the weekend Discussed appropriate sleep hygiene measures, provided handout Discussed risk associated with certain pharmacologic sleep aids and that continuing with CBT for treatment of insomnia is recommended

## 2021-10-09 NOTE — Assessment & Plan Note (Signed)
Chronic intermittent low back pain due to known degenerative disc disease Has worked with physical therapy in the past, was provided with home exercise program Currently has some pain, will utilize Tylenol as needed to help with pain, tramadol occasionally if having more significant pain Discussed options at present with patient, she plans to start working out at fitness center downstairs, will work on home exercise program that she was provided with previously Offered referral to physical therapy, she wishes to hold off on this for right now

## 2021-10-09 NOTE — Assessment & Plan Note (Addendum)
Noted on prior labs, most recently in March with finding of 5.7% Will recheck A1c today

## 2021-10-09 NOTE — Assessment & Plan Note (Signed)
TSH last checked in March, was normal at that time Will repeat TSH today, adjust dose of levothyroxine as indicated

## 2021-10-09 NOTE — Assessment & Plan Note (Signed)
Indicates that it has been many years since her last tetanus shot, interested in receiving booster today Tetanus booster administered in office

## 2021-10-09 NOTE — Assessment & Plan Note (Signed)
Patient needing influenza vaccination today and is interested Influenza vaccine administered

## 2021-10-09 NOTE — Patient Instructions (Signed)
  Medication Instructions:  Your physician recommends that you continue on your current medications as directed. Please refer to the Current Medication list given to you today. --If you need a refill on any your medications before your next appointment, please call your pharmacy first. If no refills are authorized on file call the office.-- Lab Work: Your physician has recommended that you have lab work today: A1c and TSH If you have labs (blood work) drawn today and your tests are completely normal, you will receive your results via MyChart message OR a phone call from our staff.  Please ensure you check your voicemail in the event that you authorized detailed messages to be left on a delegated number. If you have any lab test that is abnormal or we need to change your treatment, we will call you to review the results.  Follow-Up: Your next appointment:   Your physician recommends that you schedule a follow-up appointment in: 2-3 MONTHS with Dr. de [place]  You will receive a text message or e-mail with a link to a survey about your care and experience with [place] today! We would greatly appreciate your feedback!   Thanks for letting us be apart of your health journey!!  Primary Care and Sports Medicine   Dr. Ceasar Mons Peru   We encourage you to activate your patient portal called "MyChart".  Sign up information is provided on this After Visit Summary.  MyChart is used to connect with patients for Virtual Visits (Telemedicine).  Patients are able to view lab/test results, encounter notes, upcoming appointments, etc.  Non-urgent messages can be sent to your provider as well. To learn more about what you can do with MyChart, please visit --  [email or web address].

## 2021-10-10 ENCOUNTER — Encounter (HOSPITAL_BASED_OUTPATIENT_CLINIC_OR_DEPARTMENT_OTHER): Payer: Self-pay | Admitting: Family Medicine

## 2021-10-10 ENCOUNTER — Telehealth (HOSPITAL_BASED_OUTPATIENT_CLINIC_OR_DEPARTMENT_OTHER): Payer: Self-pay

## 2021-10-10 DIAGNOSIS — E039 Hypothyroidism, unspecified: Secondary | ICD-10-CM

## 2021-10-10 LAB — TSH: TSH: 0.393 u[IU]/mL — ABNORMAL LOW (ref 0.450–4.500)

## 2021-10-10 MED ORDER — LEVOTHYROXINE SODIUM 88 MCG PO TABS: ORAL_TABLET | ORAL | 1 refills | Status: DC

## 2021-10-10 NOTE — Telephone Encounter (Signed)
-----   Message from Hosie Poisson [place], MD sent at 10/10/2021  8:22 AM EDT ----- Hemoglobin A1c is stable at 6.0%.  TSH is low -due to this would make adjustment in dose of levothyroxine with slight decrease in dose from 100 mcg to 88 mcg.  Please send in new prescription with quantity 30, 1 refill.  Will need to repeat TSH in 6 to 8 weeks, can be done as nurse visit.

## 2021-10-10 NOTE — Telephone Encounter (Signed)
Per DPR, left detailed message of results and recommendations on designated number Instructed patient to view results via MyChart or call the office with any questions or concerns.    Will send RX to pharmacy.

## 2021-10-13 ENCOUNTER — Ambulatory Visit (INDEPENDENT_AMBULATORY_CARE_PROVIDER_SITE_OTHER): Payer: Medicare Other | Admitting: Psychologist

## 2021-10-13 ENCOUNTER — Other Ambulatory Visit: Payer: Self-pay

## 2021-10-13 DIAGNOSIS — F321 Major depressive disorder, single episode, moderate: Secondary | ICD-10-CM | POA: Diagnosis not present

## 2021-10-14 ENCOUNTER — Other Ambulatory Visit (HOSPITAL_BASED_OUTPATIENT_CLINIC_OR_DEPARTMENT_OTHER): Payer: Self-pay | Admitting: Family Medicine

## 2021-10-18 ENCOUNTER — Other Ambulatory Visit (HOSPITAL_BASED_OUTPATIENT_CLINIC_OR_DEPARTMENT_OTHER): Payer: Self-pay | Admitting: Family Medicine

## 2021-10-18 MED ORDER — TRAMADOL HCL 50 MG PO TABS: 50.0000 mg | ORAL_TABLET | Freq: Four times a day (QID) | ORAL | 0 refills | Status: DC | PRN

## 2021-10-18 NOTE — Telephone Encounter (Signed)
Reviewed PDMP, no red flags

## 2021-10-20 ENCOUNTER — Other Ambulatory Visit (HOSPITAL_BASED_OUTPATIENT_CLINIC_OR_DEPARTMENT_OTHER): Payer: Self-pay | Admitting: Family Medicine

## 2021-10-23 ENCOUNTER — Other Ambulatory Visit (HOSPITAL_BASED_OUTPATIENT_CLINIC_OR_DEPARTMENT_OTHER): Payer: Self-pay | Admitting: Family Medicine

## 2021-10-24 ENCOUNTER — Other Ambulatory Visit (HOSPITAL_BASED_OUTPATIENT_CLINIC_OR_DEPARTMENT_OTHER): Payer: Self-pay | Admitting: Family Medicine

## 2021-10-24 MED ORDER — TRAMADOL HCL 50 MG PO TABS: 50.0000 mg | ORAL_TABLET | Freq: Four times a day (QID) | ORAL | 0 refills | Status: DC | PRN

## 2021-10-29 ENCOUNTER — Other Ambulatory Visit: Payer: Self-pay | Admitting: Obstetrics and Gynecology

## 2021-11-01 ENCOUNTER — Ambulatory Visit: Payer: Medicare Other | Admitting: Emergency Medicine

## 2021-11-02 ENCOUNTER — Ambulatory Visit: Payer: Medicare Other | Admitting: Podiatry

## 2021-11-07 ENCOUNTER — Ambulatory Visit (INDEPENDENT_AMBULATORY_CARE_PROVIDER_SITE_OTHER): Payer: Medicare Other | Admitting: Podiatry

## 2021-11-07 ENCOUNTER — Other Ambulatory Visit: Payer: Self-pay

## 2021-11-07 DIAGNOSIS — M79675 Pain in left toe(s): Secondary | ICD-10-CM | POA: Diagnosis not present

## 2021-11-07 DIAGNOSIS — B351 Tinea unguium: Secondary | ICD-10-CM | POA: Diagnosis not present

## 2021-11-07 DIAGNOSIS — M79674 Pain in right toe(s): Secondary | ICD-10-CM

## 2021-11-13 ENCOUNTER — Ambulatory Visit: Payer: Medicare Other | Admitting: Neurology

## 2021-11-13 NOTE — Progress Notes (Signed)
Subjective: 80 year old female presents the office today for follow-up evaluation of symptomatic onychomycosis.  She stopped using the Penlac visible to the nails getting better.  No swelling or redness or injury to the toenail sites.  She denies any swelling or redness to the toenail sites.    Objective: AAO x3, NAD DP/PT pulses palpable bilaterally, CRT less than 3 seconds Nails continue be hypertrophic, dystrophic with yellow-brown discoloration at the distal portion.  There is some mild clearing noted on the proximal nail fold most on the hallux.  There is tenderness nails 1-5 bilaterally as they are elongated.  No edema, erythema or signs of infection.  No open lesions. No pain with calf compression, swelling, warmth, erythema  Assessment: Symptomatic onychomycosis  Plan: -All treatment options discussed with the patient including all alternatives, risks, complications.  -Nails sharply debrided x10 without any complications or bleeding.  Recommend continue Penlac as its been helpful and seems to be growing out some although slowly. -Patient encouraged to call the office with any questions, concerns, change in symptoms.   Vivi Barrack DPM

## 2021-11-27 NOTE — Progress Notes (Signed)
Office Visit Note  Patient: Alexis Shelton             Date of Birth: Apr 23, 1941           MRN: [id number]             PCP: de [place], Raymond J, MD Referring: de [place], Raymond J, MD Visit Date: 11/29/2021 Occupation: @GUAROCC @  Subjective:  Medication management   History of Present Illness: Alexis Shelton is a 80 y.o. female with a history of gout, osteoarthritis and degenerative disc disease.  She returns today after her last visit in June 2020.  Been taking allopurinol 100 mg, 1 tablet daily.  She had a flare about 1 year ago which responded to colchicine.  He continues to have pain and stiffness in her hands.  She states she has decreased grip strength and has difficulty cooking.  She has been buying more comfortable shoes which has been helpful.  She continues to have neck and lower back pain.  Her replaced bilateral knee joints are doing well.  Patient states that she has been having multiple new health problems since trying to establish with a new PCP as her previous PCP left.  Activities of Daily Living:  Patient reports morning stiffness for 24 hours.   Patient Reports nocturnal pain.  Difficulty dressing/grooming: Reports Difficulty climbing stairs: Denies Difficulty getting out of chair: Denies Difficulty using hands for taps, buttons, cutlery, and/or writing: Reports  Review of Systems  Constitutional:  Positive for fatigue.  HENT:  Negative for mouth dryness.   Eyes:  Positive for dryness.  Respiratory:  Positive for shortness of breath.   Cardiovascular:  Negative for swelling in legs/feet.  Gastrointestinal:  Positive for constipation.  Endocrine: Positive for heat intolerance, excessive thirst and increased urination.  Genitourinary:  Negative for difficulty urinating.  Musculoskeletal:  Positive for joint pain, gait problem, joint pain, muscle weakness and morning stiffness. Negative for joint swelling.  Skin:  Negative for hair loss and sensitivity to  sunlight.  Allergic/Immunologic: Negative for susceptible to infections.  Neurological:  Positive for dizziness, numbness and weakness.  Hematological:  Negative for bruising/bleeding tendency.  Psychiatric/Behavioral:  Negative for sleep disturbance.    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   Laceration of auricle of ear, left, initial encounter 08/09/2021   Insomnia 08/09/2021   Obesity 08/19/2020   Proteinuria 08/19/2020   Vitamin D deficiency 02/11/2019   Stage 3 chronic kidney disease ([place]) 02/11/2019   Mild intermittent asthma without complication [id number]   Primary osteoarthritis of both hands 11/24/2018   Primary osteoarthritis of both feet 11/24/2018   DDD (degenerative disc disease), cervical 11/24/2018   DDD (degenerative disc disease), lumbar 11/24/2018   History of stroke involving cerebellum 10/13/2018   Gait abnormality 10/13/2018   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   Asthma 08/12/2018   Hypothyroidism 08/12/2018   Essential hypertension 07/04/2018   Hyperlipidemia 07/04/2018   Hyperglycemia 05/28/2017   Seasonal allergic rhinitis 05/28/2017   Gouty arthropathy 09/15/2010   Osteoarthrosis, generalized, involving multiple sites 09/15/2010   Esophagitis 08/30/2010    Past Medical History:  Diagnosis Date   Arthritis    DDD (degenerative disc disease), lumbar 11/24/2018   Dizziness    Gallstones    Hearing loss    History of stroke involving  cerebellum 10/13/2018   Hypercholesteremia    Hypertension    Hypothyroidism    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   Migraines    Mild intermittent asthma without complication [id number]   Primary osteoarthritis of both feet 11/24/2018   Rheumatoid arthritis ([place]) 07/04/2018   Stage 3 chronic kidney disease ([place])  02/11/2019   Vitamin D deficiency 02/11/2019    Family History  Problem Relation Age of Onset   Stroke Mother    Hyperlipidemia Mother    Arthritis Mother    Heart disease Father    Arthritis Sister    Bipolar disorder Son    Healthy Son    Gallbladder disease Maternal Grandmother        radiation   GER disease Maternal Grandmother    Heart disease Paternal Uncle        x 7   Colon cancer Neg Hx    Esophageal cancer Neg Hx    Rectal cancer Neg Hx    Stomach cancer Neg Hx    Cancer Neg Hx    Past Surgical History:  Procedure Laterality Date   ABDOMINAL HYSTERECTOMY     BREAST REDUCTION SURGERY Bilateral    CATARACT EXTRACTION, BILATERAL     INCISION AND DRAINAGE BREAST ABSCESS Left    REDUCTION MAMMAPLASTY     REPLACEMENT TOTAL KNEE Bilateral    TONSILLECTOMY AND ADENOIDECTOMY     Social History   Social History Narrative   Lives alone.  The patient is widowed.   She worked at the [employer] in [place], MD scientist, and then moved to [place] and moved to [place] where her son is a Animal nutritionist, after her husband died   Right-handed.   1 cup coffee per day.   No alcohol   1 son as above   Former smoker   Immunization History  Administered Date(s) Administered   Fluad Quad(high Dose 65+) 10/09/2021   Influenza, High Dose Seasonal PF 09/20/2018, 09/11/2019, 12/08/2019   Influenza-Unspecified 11/20/2020   Moderna Sars-Covid-2 Vaccination 01/08/2020, 02/04/2020   PFIZER(Purple Top)SARS-COV-2 Vaccination 11/20/2020   Td 10/09/2021     Objective: Vital Signs: BP (!) 171/70 (BP Location: Right Arm, Patient Position: Sitting, Cuff Size: Normal)   Pulse (!) 52   Resp 16   Ht 5\' 6"  (1.676 m)   Wt 172 lb (78 kg)   BMI 27.76 kg/m    Physical Exam Vitals and nursing note reviewed.  Constitutional:      Appearance: She is well-developed.  HENT:     Head: Normocephalic and atraumatic.  Eyes:     Conjunctiva/sclera: Conjunctivae normal.  Cardiovascular:     Rate  and Rhythm: Normal rate and regular rhythm.     Heart sounds: Normal heart sounds.  Pulmonary:     Effort: Pulmonary effort is normal.     Breath sounds: Normal breath sounds.  Abdominal:     General: Bowel sounds are normal.     Palpations: Abdomen is soft.  Musculoskeletal:     Cervical back: Normal range of motion.  Lymphadenopathy:     Cervical: No cervical adenopathy.  Skin:    General: Skin is warm and dry.     Capillary Refill: Capillary refill takes less than 2 seconds.  Neurological:     Mental Status: She is alert and oriented to person, place, and time.  Psychiatric:        Behavior: Behavior normal.     Musculoskeletal Exam: C-spine was in good range of motion with  some stiffness.  She had limited painful range of motion of lumbar spine.  Shoulder joints and elbow joints in good range of motion.  She had bilateral PIP and DIP thickening with subluxation of several of her DIP joints.  No synovitis was noted.  Hip joints and knee joints with good range of motion.  She had no tenderness over ankles or MTPs.  CDAI Exam: CDAI Score: -- Patient Global: --; Provider Global: -- Swollen: --; Tender: -- Joint Exam 11/29/2021   No joint exam has been documented for this visit   There is currently no information documented on the homunculus. Go to the Rheumatology activity and complete the homunculus joint exam.  Investigation: No additional findings.  Imaging: No results found.  Recent Labs: Lab Results  Component Value Date   WBC 5.9 03/06/2021   HGB 13.3 03/06/2021   PLT 234 03/06/2021   NA 141 03/06/2021   K 5.0 03/06/2021   CL 108 03/06/2021   CO2 26 03/06/2021   GLUCOSE 88 03/06/2021   BUN 23 03/06/2021   CREATININE 1.40 (H) 03/06/2021   BILITOT 0.4 03/06/2021   AST 20 03/06/2021   ALT 15 03/06/2021   PROT 6.7 03/06/2021   CALCIUM 9.5 03/06/2021   GFRAA 41 (L) 12/08/2019    Speciality Comments: No specialty comments available.  Procedures:  No  procedures performed Allergies: Other   Assessment / Plan:     Visit Diagnoses: Idiopathic chronic gout of multiple sites without tophus -patient returns today after her last visit approximately 2 years ago.  She states that her PCP left hand and she is trying to establish with a new PCP.  In the meantime she would like to come here for further management of her gout.  She had only 1 flare since her last visit which resolved after taking colchicine.  She takes colchicine only on as needed basis.  She has been taking allopurinol 100 mg p.o. daily.   - Plan: Uric acid today.  Medication monitoring encounter - Plan: CBC with Differential/Platelet, COMPLETE METABOLIC PANEL WITH GFR today.  Chronic renal impairment, stage 3b (HCC)-she has low GFR for several years.  She is concerned about her kidneys and would like to be referred to a kidney specialist.  Primary osteoarthritis of both hands-she has severe osteoarthritis in her hands with DIP and PIP thickening and subluxation of several of her DIP joints.  Joint protection muscle strengthening was discussed.  Handout on hand exercises was given.  Status post total bilateral knee replacement-doing well.  Primary osteoarthritis of both feet-she states that pain in her feet is better since she has been wearing proper fitting shoes.  DDD (degenerative disc disease), cervical-she had good range of motion of her cervical spine with some stiffness.  DDD (degenerative disc disease), lumbar-she has chronic lower back pain.  Essential hypertension-her blood pressure was elevated.  Have advised her to monitor blood pressure closely and follow-up with the PCP.  She will try to schedule with a new PCP.  Dyslipidemia  Vitamin D deficiency-her last vitamin D was 58 on March 06, 2021.  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  I offered referral to physical therapy.  She stated that she has done  physical therapy which was not very beneficial.  History of hypothyroidism  History of hearing loss  Orders: Orders Placed This Encounter  Procedures   CBC with Differential/Platelet   COMPLETE METABOLIC PANEL WITH GFR   Uric  acid   Ambulatory referral to Nephrology    No orders of the defined types were placed in this encounter.    Follow-Up Instructions: Return in about 6 months (around 05/30/2022) for Osteoarthritis.   Bo Merino, MD  Note - This record has been created using Editor, commissioning.  Chart creation errors have been sought, but may not always  have been located. Such creation errors do not reflect on  the standard of medical care.

## 2021-11-28 ENCOUNTER — Ambulatory Visit (HOSPITAL_BASED_OUTPATIENT_CLINIC_OR_DEPARTMENT_OTHER): Payer: Medicare Other | Admitting: Family Medicine

## 2021-11-29 ENCOUNTER — Encounter: Payer: Self-pay | Admitting: Rheumatology

## 2021-11-29 ENCOUNTER — Ambulatory Visit (INDEPENDENT_AMBULATORY_CARE_PROVIDER_SITE_OTHER): Payer: Medicare Other | Admitting: Rheumatology

## 2021-11-29 ENCOUNTER — Other Ambulatory Visit: Payer: Self-pay

## 2021-11-29 VITALS — BP 171/70 | HR 52 | Resp 16 | Ht 66.0 in | Wt 172.0 lb

## 2021-11-29 DIAGNOSIS — M5136 Other intervertebral disc degeneration, lumbar region: Secondary | ICD-10-CM

## 2021-11-29 DIAGNOSIS — I10 Essential (primary) hypertension: Secondary | ICD-10-CM

## 2021-11-29 DIAGNOSIS — M19041 Primary osteoarthritis, right hand: Secondary | ICD-10-CM | POA: Diagnosis not present

## 2021-11-29 DIAGNOSIS — Z8673 Personal history of transient ischemic attack (TIA), and cerebral infarction without residual deficits: Secondary | ICD-10-CM

## 2021-11-29 DIAGNOSIS — E785 Hyperlipidemia, unspecified: Secondary | ICD-10-CM

## 2021-11-29 DIAGNOSIS — M1A09X0 Idiopathic chronic gout, multiple sites, without tophus (tophi): Secondary | ICD-10-CM | POA: Diagnosis not present

## 2021-11-29 DIAGNOSIS — M19042 Primary osteoarthritis, left hand: Secondary | ICD-10-CM

## 2021-11-29 DIAGNOSIS — Z5181 Encounter for therapeutic drug level monitoring: Secondary | ICD-10-CM

## 2021-11-29 DIAGNOSIS — G8929 Other chronic pain: Secondary | ICD-10-CM

## 2021-11-29 DIAGNOSIS — M19071 Primary osteoarthritis, right ankle and foot: Secondary | ICD-10-CM | POA: Diagnosis not present

## 2021-11-29 DIAGNOSIS — M19072 Primary osteoarthritis, left ankle and foot: Secondary | ICD-10-CM

## 2021-11-29 DIAGNOSIS — E559 Vitamin D deficiency, unspecified: Secondary | ICD-10-CM | POA: Diagnosis not present

## 2021-11-29 DIAGNOSIS — Z96653 Presence of artificial knee joint, bilateral: Secondary | ICD-10-CM | POA: Diagnosis not present

## 2021-11-29 DIAGNOSIS — N1832 Chronic kidney disease, stage 3b: Secondary | ICD-10-CM

## 2021-11-29 DIAGNOSIS — M65331 Trigger finger, right middle finger: Secondary | ICD-10-CM

## 2021-11-29 DIAGNOSIS — M5030 Other cervical disc degeneration, unspecified cervical region: Secondary | ICD-10-CM | POA: Diagnosis not present

## 2021-11-29 DIAGNOSIS — Z8669 Personal history of other diseases of the nervous system and sense organs: Secondary | ICD-10-CM

## 2021-11-29 DIAGNOSIS — Z8639 Personal history of other endocrine, nutritional and metabolic disease: Secondary | ICD-10-CM

## 2021-11-29 NOTE — Patient Instructions (Signed)
Hand Exercises Hand exercises can be helpful for almost anyone. These exercises can strengthen the hands, improve flexibility and movement, and increase blood flow to the hands. These results can make work and daily tasks easier. Hand exercises can be especially helpful for people who have joint pain from arthritis or have nerve damage from overuse (carpal tunnel syndrome). These exercises can also help people who have injured a hand. Exercises Most of these hand exercises are gentle stretching and motion exercises. It is usually safe to do them often throughout the day. Warming up your hands before exercise may help to reduce stiffness. You can do this with gentle massage or by placing your hands in warm water for 10-15 minutes. It is normal to feel some stretching, pulling, tightness, or mild discomfort as you begin new exercises. This will gradually improve. Stop an exercise right away if you feel sudden, severe pain or your pain gets worse. Ask your health care provider which exercises are best for you. Knuckle bend or "claw" fist  Stand or sit with your arm, hand, and all five fingers pointed straight up. Make sure to keep your wrist straight during the exercise. Gently bend your fingers down toward your palm until the tips of your fingers are touching the top of your palm. Keep your big knuckle straight and just bend the small knuckles in your fingers. Hold this position for __________ seconds. Straighten (extend) your fingers back to the starting position. Repeat this exercise 5-10 times with each hand. Full finger fist  Stand or sit with your arm, hand, and all five fingers pointed straight up. Make sure to keep your wrist straight during the exercise. Gently bend your fingers into your palm until the tips of your fingers are touching the middle of your palm. Hold this position for __________ seconds. Extend your fingers back to the starting position, stretching every joint fully. Repeat  this exercise 5-10 times with each hand. Straight fist Stand or sit with your arm, hand, and all five fingers pointed straight up. Make sure to keep your wrist straight during the exercise. Gently bend your fingers at the big knuckle, where your fingers meet your hand, and the middle knuckle. Keep the knuckle at the tips of your fingers straight and try to touch the bottom of your palm. Hold this position for __________ seconds. Extend your fingers back to the starting position, stretching every joint fully. Repeat this exercise 5-10 times with each hand. Tabletop  Stand or sit with your arm, hand, and all five fingers pointed straight up. Make sure to keep your wrist straight during the exercise. Gently bend your fingers at the big knuckle, where your fingers meet your hand, as far down as you can while keeping the small knuckles in your fingers straight. Think of forming a tabletop with your fingers. Hold this position for __________ seconds. Extend your fingers back to the starting position, stretching every joint fully. Repeat this exercise 5-10 times with each hand. Finger spread  Place your hand flat on a table with your palm facing down. Make sure your wrist stays straight as you do this exercise. Spread your fingers and thumb apart from each other as far as you can until you feel a gentle stretch. Hold this position for __________ seconds. Bring your fingers and thumb tight together again. Hold this position for __________ seconds. Repeat this exercise 5-10 times with each hand. Making circles  Stand or sit with your arm, hand, and all five fingers pointed   straight up. Make sure to keep your wrist straight during the exercise. Make a circle by touching the tip of your thumb to the tip of your index finger. Hold for __________ seconds. Then open your hand wide. Repeat this motion with your thumb and each finger on your hand. Repeat this exercise 5-10 times with each hand. Thumb  motion  Sit with your forearm resting on a table and your wrist straight. Your thumb should be facing up toward the ceiling. Keep your fingers relaxed as you move your thumb. Lift your thumb up as high as you can toward the ceiling. Hold for __________ seconds. Bend your thumb across your palm as far as you can, reaching the tip of your thumb for the small finger (pinkie) side of your palm. Hold for __________ seconds. Repeat this exercise 5-10 times with each hand. Grip strengthening  Hold a stress ball or other soft ball in the middle of your hand. Slowly increase the pressure, squeezing the ball as much as you can without causing pain. Think of bringing the tips of your fingers into the middle of your palm. All of your finger joints should bend when doing this exercise. Hold your squeeze for __________ seconds, then relax. Repeat this exercise 5-10 times with each hand. Contact a health care provider if: Your hand pain or discomfort gets much worse when you do an exercise. Your hand pain or discomfort does not improve within 2 hours after you exercise. If you have any of these problems, stop doing these exercises right away. Do not do them again unless your health care provider says that you can. Get help right away if: You develop sudden, severe hand pain or swelling. If this happens, stop doing these exercises right away. Do not do them again unless your health care provider says that you can. This information is not intended to replace advice given to you by your health care provider. Make sure you discuss any questions you have with your health care provider. Document Revised: 03/30/2021 Document Reviewed: 03/30/2021 Elsevier Patient Education  2022 Elsevier Inc.  

## 2021-11-30 LAB — CBC WITH DIFFERENTIAL/PLATELET
Absolute Monocytes: 747 {cells}/uL (ref 200–950)
Basophils Absolute: 39 {cells}/uL (ref 0–200)
Basophils Relative: 0.5 %
Eosinophils Absolute: 308 {cells}/uL (ref 15–500)
Eosinophils Relative: 4 %
HCT: 43.6 % (ref 35.0–45.0)
Hemoglobin: 13.8 g/dL (ref 11.7–15.5)
Lymphs Abs: 1987 {cells}/uL (ref 850–3900)
MCH: 29.2 pg (ref 27.0–33.0)
MCHC: 31.7 g/dL — ABNORMAL LOW (ref 32.0–36.0)
MCV: 92.2 fL (ref 80.0–100.0)
MPV: 10.4 fL (ref 7.5–12.5)
Monocytes Relative: 9.7 %
Neutro Abs: 4620 {cells}/uL (ref 1500–7800)
Neutrophils Relative %: 60 %
Platelets: 261 Thousand/uL (ref 140–400)
RBC: 4.73 Million/uL (ref 3.80–5.10)
RDW: 15.7 % — ABNORMAL HIGH (ref 11.0–15.0)
Total Lymphocyte: 25.8 %
WBC: 7.7 Thousand/uL (ref 3.8–10.8)

## 2021-11-30 LAB — COMPLETE METABOLIC PANEL WITH GFR
AG Ratio: 1.3 (calc) (ref 1.0–2.5)
ALT: 119 U/L — ABNORMAL HIGH (ref 6–29)
AST: 36 U/L — ABNORMAL HIGH (ref 10–35)
Albumin: 3.9 g/dL (ref 3.6–5.1)
Alkaline phosphatase (APISO): 129 U/L (ref 37–153)
BUN/Creatinine Ratio: 20 (calc) (ref 6–22)
BUN: 27 mg/dL — ABNORMAL HIGH (ref 7–25)
CO2: 27 mmol/L (ref 20–32)
Calcium: 9.7 mg/dL (ref 8.6–10.4)
Chloride: 107 mmol/L (ref 98–110)
Creat: 1.34 mg/dL — ABNORMAL HIGH (ref 0.60–0.95)
Globulin: 2.9 g/dL (calc) (ref 1.9–3.7)
Glucose, Bld: 92 mg/dL (ref 65–99)
Potassium: 4.9 mmol/L (ref 3.5–5.3)
Sodium: 143 mmol/L (ref 135–146)
Total Bilirubin: 0.5 mg/dL (ref 0.2–1.2)
Total Protein: 6.8 g/dL (ref 6.1–8.1)
eGFR: 40 mL/min/{1.73_m2} — ABNORMAL LOW (ref >=60)

## 2021-11-30 LAB — URIC ACID: Uric Acid, Serum: 5.7 mg/dL (ref 2.5–7.0)

## 2021-11-30 NOTE — Progress Notes (Signed)
CBC is normal, creatinine is elevated.  LFTs are elevated.  Elevation of LFTs could be related to statin use.  Please advise patient to avoid all NSAIDs.  Please advise her to schedule an appointment with her PCP for the evaluation of elevated LFTs.  Please contact her PCPs office regarding the lab results.  Uric acid is in desirable range.

## 2021-12-03 ENCOUNTER — Other Ambulatory Visit (HOSPITAL_BASED_OUTPATIENT_CLINIC_OR_DEPARTMENT_OTHER): Payer: Self-pay | Admitting: Family Medicine

## 2021-12-03 DIAGNOSIS — E039 Hypothyroidism, unspecified: Secondary | ICD-10-CM

## 2021-12-04 ENCOUNTER — Ambulatory Visit (INDEPENDENT_AMBULATORY_CARE_PROVIDER_SITE_OTHER): Payer: Medicare Other | Admitting: Family Medicine

## 2021-12-04 ENCOUNTER — Other Ambulatory Visit: Payer: Self-pay

## 2021-12-04 ENCOUNTER — Encounter (HOSPITAL_BASED_OUTPATIENT_CLINIC_OR_DEPARTMENT_OTHER): Payer: Self-pay | Admitting: Family Medicine

## 2021-12-04 VITALS — BP 160/60 | HR 53 | Ht 66.0 in | Wt 176.8 lb

## 2021-12-04 DIAGNOSIS — E039 Hypothyroidism, unspecified: Secondary | ICD-10-CM | POA: Diagnosis not present

## 2021-12-04 DIAGNOSIS — E785 Hyperlipidemia, unspecified: Secondary | ICD-10-CM

## 2021-12-04 DIAGNOSIS — N1832 Chronic kidney disease, stage 3b: Secondary | ICD-10-CM | POA: Diagnosis not present

## 2021-12-04 NOTE — Progress Notes (Signed)
    Procedures performed today:    None.  Independent interpretation of notes and tests performed by another provider:   None.  Brief History, Exam, Impression, and Recommendations:    BP (!) 160/60   Pulse (!) 53   Ht _0  (1.676 m)   Wt 176 lb 12.8 oz (80.2 kg)   SpO2 98%   BMI 28.54 kg/m   Stage 3 chronic kidney disease (HCC) Recently had labs completed with rheumatologist and was found to have elevated creatinine, however has been stable over prior lab draws Corresponding eGFR found to be 40 placing her within stage 3b category Discussed with patient laboratory findings and recommendation to establish with nephrologist, patient is in agreement, referral to nephrology placed  Hyperlipidemia Has been managed chronically on atorvastatin Reports having recent episode of generally feeling "off"/unwell and had associated dark urine.  This has resolved.  She still continues to have some muscle aches/fatigue Recently on labs completed by rheumatology, patient was found to also have abnormal liver enzymes.  Most recent lab readings did have normal AST and ALT, however she has had prior abnormal readings. Discussed with patient these findings as well as her symptoms in concern that it could be related to statin therapy.  At this time, will hold statin and monitor symptoms.  We will also plan to repeat labs at follow-up visit in about 1 month  Hypothyroidism Patient had slightly low TSH on last labs Dose of levothyroxine was accordingly adjusted down slightly, she has been taking this new dose for about 1 month reportedly Given timeframe for which she has been taking new dose, will plan to recheck TSH at next office visit in about 1 month in order to ensure accurate reading of TSH based on new dose  Plan for follow-up at previously scheduled appointment which is in about 1 month.  At that time we will check CMP, TSH, follow-up on referral to  nephrology   ___________________________________________ Javen Ridings de [place], MD, ABFM, CAQSM [facility]

## 2021-12-04 NOTE — Patient Instructions (Addendum)
  Medication Instructions:  Your physician has recommended you make the following change in your medication:  -- STOP Atorvastatin --If you need a refill on any your medications before your next appointment, please call your pharmacy first. If no refills are authorized on file call the office.-- Referrals/Procedures/Imaging: A referral has been placed for you to [facility] for evaluation and treatment. Someone from the scheduling department will be in contact with you in regards to coordinating your consultation. If you do not hear from any of the schedulers within 7-10 business days please give their office a call.[facility] [address] [place] [phone]  Follow-Up: Your next appointment:   Your physician recommends that you schedule a follow-up appointment in: In January with Dr. de [place]  You will receive a text message or e-mail with a link to a survey about your care and experience with [place] today! We would greatly appreciate your feedback!   Thanks for letting us be apart of your health journey!!  [facility]   Dr. Ceasar Mons [place]   We encourage you to activate your patient portal called "MyChart".  Sign up information is provided on this After Visit Summary.  MyChart is used to connect with patients for Virtual Visits (Telemedicine).  Patients are able to view lab/test results, encounter notes, upcoming appointments, etc.  Non-urgent messages can be sent to your provider as well. To learn more about what you can do with MyChart, please visit --  [email or web address].

## 2021-12-05 NOTE — Assessment & Plan Note (Signed)
Recently had labs completed with rheumatologist and was found to have elevated creatinine, however has been stable over prior lab draws Corresponding eGFR found to be 40 placing her within stage 3b category Discussed with patient laboratory findings and recommendation to establish with nephrologist, patient is in agreement, referral to nephrology placed

## 2021-12-05 NOTE — Assessment & Plan Note (Signed)
Has been managed chronically on atorvastatin Reports having recent episode of generally feeling "off"/unwell and had associated dark urine.  This has resolved.  She still continues to have some muscle aches/fatigue Recently on labs completed by rheumatology, patient was found to also have abnormal liver enzymes.  Most recent lab readings did have normal AST and ALT, however she has had prior abnormal readings. Discussed with patient these findings as well as her symptoms in concern that it could be related to statin therapy.  At this time, will hold statin and monitor symptoms.  We will also plan to repeat labs at follow-up visit in about 1 month

## 2021-12-05 NOTE — Assessment & Plan Note (Signed)
Patient had slightly low TSH on last labs Dose of levothyroxine was accordingly adjusted down slightly, she has been taking this new dose for about 1 month reportedly Given timeframe for which she has been taking new dose, will plan to recheck TSH at next office visit in about 1 month in order to ensure accurate reading of TSH based on new dose

## 2021-12-08 ENCOUNTER — Encounter (HOSPITAL_BASED_OUTPATIENT_CLINIC_OR_DEPARTMENT_OTHER): Payer: Self-pay | Admitting: *Deleted

## 2021-12-08 ENCOUNTER — Other Ambulatory Visit: Payer: Self-pay

## 2021-12-08 ENCOUNTER — Observation Stay (HOSPITAL_COMMUNITY): Payer: Medicare Other

## 2021-12-08 ENCOUNTER — Emergency Department (HOSPITAL_BASED_OUTPATIENT_CLINIC_OR_DEPARTMENT_OTHER): Payer: Medicare Other

## 2021-12-08 ENCOUNTER — Inpatient Hospital Stay (HOSPITAL_BASED_OUTPATIENT_CLINIC_OR_DEPARTMENT_OTHER)
Admission: EM | Admit: 2021-12-08 | Discharge: 2021-12-12 | DRG: 418 | Disposition: A | Discharge status: Home / Self Care | Payer: Medicare Other | Attending: Internal Medicine | Admitting: Internal Medicine

## 2021-12-08 DIAGNOSIS — M1000 Idiopathic gout, unspecified site: Secondary | ICD-10-CM | POA: Diagnosis present

## 2021-12-08 DIAGNOSIS — R0902 Hypoxemia: Secondary | ICD-10-CM | POA: Diagnosis not present

## 2021-12-08 DIAGNOSIS — N183 Chronic kidney disease, stage 3 unspecified: Secondary | ICD-10-CM | POA: Diagnosis present

## 2021-12-08 DIAGNOSIS — J302 Other seasonal allergic rhinitis: Secondary | ICD-10-CM | POA: Diagnosis not present

## 2021-12-08 DIAGNOSIS — N289 Disorder of kidney and ureter, unspecified: Secondary | ICD-10-CM | POA: Diagnosis not present

## 2021-12-08 DIAGNOSIS — Z6828 Body mass index (BMI) 28.0-28.9, adult: Secondary | ICD-10-CM

## 2021-12-08 DIAGNOSIS — Z79899 Other long term (current) drug therapy: Secondary | ICD-10-CM | POA: Diagnosis not present

## 2021-12-08 DIAGNOSIS — M109 Gout, unspecified: Secondary | ICD-10-CM | POA: Diagnosis not present

## 2021-12-08 DIAGNOSIS — I10 Essential (primary) hypertension: Secondary | ICD-10-CM | POA: Diagnosis not present

## 2021-12-08 DIAGNOSIS — R7402 Elevation of levels of lactic acid dehydrogenase (LDH): Secondary | ICD-10-CM | POA: Diagnosis not present

## 2021-12-08 DIAGNOSIS — K449 Diaphragmatic hernia without obstruction or gangrene: Secondary | ICD-10-CM | POA: Diagnosis present

## 2021-12-08 DIAGNOSIS — I129 Hypertensive chronic kidney disease with stage 1 through stage 4 chronic kidney disease, or unspecified chronic kidney disease: Secondary | ICD-10-CM | POA: Diagnosis not present

## 2021-12-08 DIAGNOSIS — R7303 Prediabetes: Secondary | ICD-10-CM | POA: Diagnosis not present

## 2021-12-08 DIAGNOSIS — K219 Gastro-esophageal reflux disease without esophagitis: Secondary | ICD-10-CM | POA: Diagnosis present

## 2021-12-08 DIAGNOSIS — R1084 Generalized abdominal pain: Secondary | ICD-10-CM | POA: Diagnosis not present

## 2021-12-08 DIAGNOSIS — R110 Nausea: Secondary | ICD-10-CM | POA: Diagnosis not present

## 2021-12-08 DIAGNOSIS — Z90710 Acquired absence of both cervix and uterus: Secondary | ICD-10-CM | POA: Diagnosis not present

## 2021-12-08 DIAGNOSIS — R1314 Dysphagia, pharyngoesophageal phase: Secondary | ICD-10-CM | POA: Diagnosis present

## 2021-12-08 DIAGNOSIS — E7800 Pure hypercholesterolemia, unspecified: Secondary | ICD-10-CM | POA: Diagnosis not present

## 2021-12-08 DIAGNOSIS — R112 Nausea with vomiting, unspecified: Secondary | ICD-10-CM | POA: Diagnosis not present

## 2021-12-08 DIAGNOSIS — Z7982 Long term (current) use of aspirin: Secondary | ICD-10-CM

## 2021-12-08 DIAGNOSIS — E039 Hypothyroidism, unspecified: Secondary | ICD-10-CM | POA: Diagnosis present

## 2021-12-08 DIAGNOSIS — K8050 Calculus of bile duct without cholangitis or cholecystitis without obstruction: Secondary | ICD-10-CM | POA: Diagnosis not present

## 2021-12-08 DIAGNOSIS — E669 Obesity, unspecified: Secondary | ICD-10-CM | POA: Diagnosis not present

## 2021-12-08 DIAGNOSIS — Z7951 Long term (current) use of inhaled steroids: Secondary | ICD-10-CM

## 2021-12-08 DIAGNOSIS — H9190 Unspecified hearing loss, unspecified ear: Secondary | ICD-10-CM | POA: Diagnosis not present

## 2021-12-08 DIAGNOSIS — Z20822 Contact with and (suspected) exposure to covid-19: Secondary | ICD-10-CM | POA: Diagnosis present

## 2021-12-08 DIAGNOSIS — Z823 Family history of stroke: Secondary | ICD-10-CM | POA: Diagnosis not present

## 2021-12-08 DIAGNOSIS — Z79891 Long term (current) use of opiate analgesic: Secondary | ICD-10-CM

## 2021-12-08 DIAGNOSIS — R1010 Upper abdominal pain, unspecified: Secondary | ICD-10-CM | POA: Diagnosis not present

## 2021-12-08 DIAGNOSIS — J4520 Mild intermittent asthma, uncomplicated: Secondary | ICD-10-CM | POA: Diagnosis present

## 2021-12-08 DIAGNOSIS — N1832 Chronic kidney disease, stage 3b: Secondary | ICD-10-CM | POA: Diagnosis not present

## 2021-12-08 DIAGNOSIS — Z888 Allergy status to other drugs, medicaments and biological substances status: Secondary | ICD-10-CM

## 2021-12-08 DIAGNOSIS — Z8249 Family history of ischemic heart disease and other diseases of the circulatory system: Secondary | ICD-10-CM

## 2021-12-08 DIAGNOSIS — M069 Rheumatoid arthritis, unspecified: Secondary | ICD-10-CM | POA: Diagnosis not present

## 2021-12-08 DIAGNOSIS — E785 Hyperlipidemia, unspecified: Secondary | ICD-10-CM | POA: Diagnosis present

## 2021-12-08 DIAGNOSIS — R000 Tachycardia, unspecified: Secondary | ICD-10-CM | POA: Diagnosis not present

## 2021-12-08 DIAGNOSIS — K8030 Calculus of bile duct with cholangitis, unspecified, without obstruction: Secondary | ICD-10-CM | POA: Diagnosis not present

## 2021-12-08 DIAGNOSIS — I248 Other forms of acute ischemic heart disease: Secondary | ICD-10-CM | POA: Diagnosis not present

## 2021-12-08 DIAGNOSIS — Z96653 Presence of artificial knee joint, bilateral: Secondary | ICD-10-CM | POA: Diagnosis present

## 2021-12-08 DIAGNOSIS — K8020 Calculus of gallbladder without cholecystitis without obstruction: Secondary | ICD-10-CM

## 2021-12-08 DIAGNOSIS — M19071 Primary osteoarthritis, right ankle and foot: Secondary | ICD-10-CM | POA: Diagnosis present

## 2021-12-08 DIAGNOSIS — Z8673 Personal history of transient ischemic attack (TIA), and cerebral infarction without residual deficits: Secondary | ICD-10-CM

## 2021-12-08 DIAGNOSIS — R109 Unspecified abdominal pain: Secondary | ICD-10-CM | POA: Diagnosis not present

## 2021-12-08 DIAGNOSIS — Z8261 Family history of arthritis: Secondary | ICD-10-CM

## 2021-12-08 DIAGNOSIS — R945 Abnormal results of liver function studies: Secondary | ICD-10-CM | POA: Diagnosis not present

## 2021-12-08 DIAGNOSIS — K8309 Other cholangitis: Secondary | ICD-10-CM | POA: Diagnosis present

## 2021-12-08 DIAGNOSIS — R748 Abnormal levels of other serum enzymes: Secondary | ICD-10-CM

## 2021-12-08 DIAGNOSIS — Z9049 Acquired absence of other specified parts of digestive tract: Secondary | ICD-10-CM

## 2021-12-08 DIAGNOSIS — Z79890 Hormone replacement therapy (postmenopausal): Secondary | ICD-10-CM

## 2021-12-08 DIAGNOSIS — N189 Chronic kidney disease, unspecified: Secondary | ICD-10-CM | POA: Diagnosis not present

## 2021-12-08 DIAGNOSIS — Z83438 Family history of other disorder of lipoprotein metabolism and other lipidemia: Secondary | ICD-10-CM

## 2021-12-08 DIAGNOSIS — Z87891 Personal history of nicotine dependence: Secondary | ICD-10-CM

## 2021-12-08 DIAGNOSIS — R7989 Other specified abnormal findings of blood chemistry: Secondary | ICD-10-CM | POA: Diagnosis present

## 2021-12-08 HISTORY — DX: Dyspnea, unspecified: R06.00

## 2021-12-08 HISTORY — DX: Other specified abnormal findings of blood chemistry: R79.89

## 2021-12-08 LAB — COMPREHENSIVE METABOLIC PANEL
ALT: 353 U/L — ABNORMAL HIGH (ref 0–44)
AST: 678 U/L — ABNORMAL HIGH (ref 15–41)
Albumin: 4 g/dL (ref 3.5–5.0)
Alkaline Phosphatase: 155 U/L — ABNORMAL HIGH (ref 38–126)
Anion gap: 11 (ref 5–15)
BUN: 26 mg/dL — ABNORMAL HIGH (ref 8–23)
CO2: 26 mmol/L (ref 22–32)
Calcium: 9.4 mg/dL (ref 8.9–10.3)
Chloride: 104 mmol/L (ref 98–111)
Creatinine, Ser: 1.32 mg/dL — ABNORMAL HIGH (ref 0.44–1.00)
GFR, Estimated: 40 mL/min — ABNORMAL LOW (ref >60)
Glucose, Bld: 151 mg/dL — ABNORMAL HIGH (ref 70–99)
Potassium: 4.3 mmol/L (ref 3.5–5.1)
Sodium: 141 mmol/L (ref 135–145)
Total Bilirubin: 1.6 mg/dL — ABNORMAL HIGH (ref 0.3–1.2)
Total Protein: 7.2 g/dL (ref 6.5–8.1)

## 2021-12-08 LAB — I-STAT VENOUS BLOOD GAS, ED
Acid-Base Excess: 1 mmol/L (ref 0.0–2.0)
Bicarbonate: 28.2 mmol/L — ABNORMAL HIGH (ref 20.0–28.0)
Calcium, Ion: 1.26 mmol/L (ref 1.15–1.40)
HCT: 37 % (ref 36.0–46.0)
Hemoglobin: 12.6 g/dL (ref 12.0–15.0)
O2 Saturation: 44 %
Patient temperature: 99.5
Potassium: 4.7 mmol/L (ref 3.5–5.1)
Sodium: 140 mmol/L (ref 135–145)
TCO2: 30 mmol/L (ref 22–32)
pCO2, Ven: 56.3 mmHg (ref 44.0–60.0)
pH, Ven: 7.31 (ref 7.250–7.430)
pO2, Ven: 28 mmHg — CL (ref 32.0–45.0)

## 2021-12-08 LAB — CBC WITH DIFFERENTIAL/PLATELET
Abs Immature Granulocytes: 0.04 10*3/uL (ref 0.00–0.07)
Basophils Absolute: 0 10*3/uL (ref 0.0–0.1)
Basophils Relative: 0 %
Eosinophils Absolute: 0 10*3/uL (ref 0.0–0.5)
Eosinophils Relative: 1 %
HCT: 44.7 % (ref 36.0–46.0)
Hemoglobin: 13.8 g/dL (ref 12.0–15.0)
Immature Granulocytes: 1 %
Lymphocytes Relative: 8 %
Lymphs Abs: 0.7 10*3/uL (ref 0.7–4.0)
MCH: 28.8 pg (ref 26.0–34.0)
MCHC: 30.9 g/dL (ref 30.0–36.0)
MCV: 93.1 fL (ref 80.0–100.0)
Monocytes Absolute: 0.4 10*3/uL (ref 0.1–1.0)
Monocytes Relative: 4 %
Neutro Abs: 7.6 10*3/uL (ref 1.7–7.7)
Neutrophils Relative %: 86 %
Platelets: 249 10*3/uL (ref 150–400)
RBC: 4.8 MIL/uL (ref 3.87–5.11)
RDW: 17 % — ABNORMAL HIGH (ref 11.5–15.5)
WBC: 8.7 10*3/uL (ref 4.0–10.5)
nRBC: 0 % (ref 0.0–0.2)

## 2021-12-08 LAB — URINALYSIS, ROUTINE W REFLEX MICROSCOPIC
Bilirubin Urine: NEGATIVE
Glucose, UA: NEGATIVE mg/dL
Hgb urine dipstick: NEGATIVE
Ketones, ur: NEGATIVE mg/dL
Leukocytes,Ua: NEGATIVE
Nitrite: NEGATIVE
Protein, ur: 30 mg/dL — AB
Specific Gravity, Urine: >1.046 — ABNORMAL HIGH (ref 1.005–1.030)
pH: 5.5 (ref 5.0–8.0)

## 2021-12-08 LAB — TROPONIN I (HIGH SENSITIVITY)
Troponin I (High Sensitivity): 12 ng/L (ref <18)
Troponin I (High Sensitivity): 32 ng/L — ABNORMAL HIGH (ref <18)
Troponin I (High Sensitivity): 34 ng/L — ABNORMAL HIGH (ref <18)

## 2021-12-08 LAB — RESP PANEL BY RT-PCR (FLU A&B, COVID) ARPGX2
Influenza A by PCR: NEGATIVE
Influenza B by PCR: NEGATIVE
SARS Coronavirus 2 by RT PCR: NEGATIVE

## 2021-12-08 LAB — PROTIME-INR
INR: 1.1 (ref 0.8–1.2)
Prothrombin Time: 14.5 seconds (ref 11.4–15.2)

## 2021-12-08 LAB — LIPASE, BLOOD: Lipase: 103 U/L — ABNORMAL HIGH (ref 11–51)

## 2021-12-08 MED ORDER — MOMETASONE FURO-FORMOTEROL FUM 200-5 MCG/ACT IN AERO
2.0000 | INHALATION_SPRAY | Freq: Two times a day (BID) | RESPIRATORY_TRACT | Status: DC
  Administered 2021-12-08 – 2021-12-12 (×6): 2 via RESPIRATORY_TRACT
  Filled 2021-12-08: qty 8.8

## 2021-12-08 MED ORDER — CIPROFLOXACIN IN D5W 400 MG/200ML IV SOLN
400.0000 mg | Freq: Two times a day (BID) | INTRAVENOUS | Status: DC
  Administered 2021-12-08 – 2021-12-12 (×7): 400 mg via INTRAVENOUS
  Filled 2021-12-08 (×7): qty 200

## 2021-12-08 MED ORDER — HYDROMORPHONE HCL 1 MG/ML IJ SOLN
0.7500 mg | Freq: Once | INTRAMUSCULAR | Status: AC
  Administered 2021-12-08: 0.75 mg via INTRAVENOUS
  Filled 2021-12-08: qty 1

## 2021-12-08 MED ORDER — SODIUM CHLORIDE 0.9 % IV BOLUS
500.0000 mL | Freq: Once | INTRAVENOUS | Status: AC
  Administered 2021-12-08: 500 mL via INTRAVENOUS

## 2021-12-08 MED ORDER — SODIUM CHLORIDE 0.9 % IV SOLN: INTRAVENOUS | Status: DC

## 2021-12-08 MED ORDER — ONDANSETRON HCL 4 MG/2ML IJ SOLN
4.0000 mg | Freq: Four times a day (QID) | INTRAMUSCULAR | Status: DC | PRN
  Administered 2021-12-08: 4 mg via INTRAVENOUS
  Filled 2021-12-08: qty 2

## 2021-12-08 MED ORDER — MONTELUKAST SODIUM 10 MG PO TABS
10.0000 mg | ORAL_TABLET | Freq: Every day | ORAL | Status: DC
  Administered 2021-12-08 – 2021-12-11 (×4): 10 mg via ORAL
  Filled 2021-12-08 (×4): qty 1

## 2021-12-08 MED ORDER — ACETAMINOPHEN 650 MG RE SUPP: 650.0000 mg | Freq: Four times a day (QID) | RECTAL | Status: DC | PRN

## 2021-12-08 MED ORDER — METOPROLOL TARTRATE 25 MG PO TABS
25.0000 mg | ORAL_TABLET | Freq: Two times a day (BID) | ORAL | Status: DC
  Administered 2021-12-08 – 2021-12-12 (×7): 25 mg via ORAL
  Filled 2021-12-08 (×7): qty 1

## 2021-12-08 MED ORDER — LEVOTHYROXINE SODIUM 88 MCG PO TABS
88.0000 ug | ORAL_TABLET | Freq: Every day | ORAL | Status: DC
  Administered 2021-12-09 – 2021-12-12 (×3): 88 ug via ORAL
  Filled 2021-12-08 (×4): qty 1

## 2021-12-08 MED ORDER — IOHEXOL 300 MG/ML  SOLN
100.0000 mL | Freq: Once | INTRAMUSCULAR | Status: AC | PRN
  Administered 2021-12-08: 80 mL via INTRAVENOUS

## 2021-12-08 MED ORDER — MORPHINE SULFATE (PF) 4 MG/ML IV SOLN
4.0000 mg | Freq: Once | INTRAVENOUS | Status: DC
  Filled 2021-12-08: qty 1

## 2021-12-08 MED ORDER — DIPHENHYDRAMINE HCL 50 MG/ML IJ SOLN
25.0000 mg | Freq: Once | INTRAMUSCULAR | Status: DC
  Filled 2021-12-08: qty 1

## 2021-12-08 MED ORDER — ONDANSETRON HCL 4 MG PO TABS: 4.0000 mg | ORAL_TABLET | Freq: Four times a day (QID) | ORAL | Status: DC | PRN

## 2021-12-08 MED ORDER — LACTATED RINGERS IV BOLUS
1000.0000 mL | Freq: Once | INTRAVENOUS | Status: AC
  Administered 2021-12-08: 1000 mL via INTRAVENOUS

## 2021-12-08 MED ORDER — ACETAMINOPHEN 325 MG PO TABS
650.0000 mg | ORAL_TABLET | Freq: Four times a day (QID) | ORAL | Status: DC | PRN
  Administered 2021-12-10 (×2): 650 mg via ORAL
  Filled 2021-12-08 (×2): qty 2

## 2021-12-08 MED ORDER — ALLOPURINOL 100 MG PO TABS
100.0000 mg | ORAL_TABLET | Freq: Every day | ORAL | Status: DC
  Administered 2021-12-08 – 2021-12-12 (×4): 100 mg via ORAL
  Filled 2021-12-08 (×4): qty 1

## 2021-12-08 MED ORDER — INSULIN ASPART 100 UNIT/ML IJ SOLN
0.0000 [IU] | Freq: Three times a day (TID) | INTRAMUSCULAR | Status: DC
  Administered 2021-12-09: 2 [IU] via SUBCUTANEOUS
  Administered 2021-12-11: 18:00:00 1 [IU] via SUBCUTANEOUS

## 2021-12-08 MED ORDER — ALBUTEROL SULFATE (2.5 MG/3ML) 0.083% IN NEBU: 3.0000 mL | INHALATION_SOLUTION | Freq: Four times a day (QID) | RESPIRATORY_TRACT | Status: DC | PRN

## 2021-12-08 MED ORDER — HYDROMORPHONE HCL 1 MG/ML IJ SOLN
0.5000 mg | INTRAMUSCULAR | Status: DC | PRN
  Administered 2021-12-09 – 2021-12-11 (×3): 0.5 mg via INTRAVENOUS
  Filled 2021-12-08 (×5): qty 0.5

## 2021-12-08 MED ORDER — CIPROFLOXACIN IN D5W 400 MG/200ML IV SOLN: 400.0000 mg | INTRAVENOUS | Status: DC

## 2021-12-08 MED ORDER — PROCHLORPERAZINE EDISYLATE 10 MG/2ML IJ SOLN
10.0000 mg | Freq: Once | INTRAMUSCULAR | Status: DC
  Filled 2021-12-08: qty 2

## 2021-12-08 NOTE — Consult Note (Addendum)
Consultation  Referring Provider:  Dr. Sabino Dick Primary Care Physician:  [place], Buren Kos, MD Primary Gastroenterologist:   Dr. Stan Head      Reason for Consultation:   AB pain, choledocholithiasis on CT with GB distension         HPI:   Alexis Shelton is a 80 y.o. female with history of hypertension, hyperlipidemia, rheumatoid arthritis, hypothyroidism, chronic kidney disease stage III, history of stroke in 2019, history of cholelithiasis presents for evaluation of abdominal pain and choledocholithiasis.  Patient was last seen 2021 by Dr. Leone Payor for which she states was similar discomfort but more left upper quadrant with nausea and vomiting.  Had unremarkable EGD at that time.  States over the past year she has had 3-4 episodes of similar epigastric pain, not as intense relieved with vomiting.  Nonbloody emesis with food that she ate earlier in symptoms even from previous day. Would have decreased appetite, decreased taste.  Feel full quickly.   Does state fatty foods like bacon will cause nausea but no vomiting or pain. Patient also recounts dark urine 2 weeks ago intermittently, but denies any yellowing of skin and eyes.  This most recent episode patient states she had worse pain that she has had previously. Started with epigastric tightness, no radiation to her back.  Had 3 episodes of vomiting which did not make her abdominal pain feel better.  Patient denies fever, did have trembling last night and this morning, did have some diaphoresis.  The patient presented to the ER. She did have slight elevation of troponin, does have some shortness of breath with lying down flat at night, but denies chest pain or shortness of breath with exertion.  Maternal grandmother had gallbladder disease negative for family history of GI malignancy. Patient denies history of alcohol or drug use. Patient is not on a blood thinner. Normally has 1 bowel movement daily, will  have occasional spells of diarrhea, denies melena or hematochezia.  Normal Cologuard 2018.  ED course:  White blood cell count 8.7, no leukocytosis.  No anemia hemoglobin 13.8, platelets 249. BUN 26, creatinine 1.32, GFR 40 AST 678, ALT 353, alkaline phosphatase 155, total bilirubin 1.6 Lipase 103 Troponin 12-->32.  Previous GI work up: Last office visit 02/15/2020 Dr. Leone Payor for reflux, esophageal dysphagia, episodic diarrhea. EGD 03/07/2020 Dr. Leone Payor for LUQ pain - Benign-appearing esophageal stenosis. Dilated. - Small hiatal hernia. - A single gastric polyp. Biopsied. - Gastritis. Biopsied Remote colonoscopy Negative Cologuard 2018  Past Medical History:  Diagnosis Date   Arthritis    DDD (degenerative disc disease), lumbar 11/24/2018   Dizziness    Gallstones    Hearing loss    History of stroke involving cerebellum 10/13/2018   Hypercholesteremia    Hypertension    Hypothyroidism    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   Migraines    Mild intermittent asthma without complication 02/11/2019   Primary osteoarthritis of both feet 11/24/2018   Rheumatoid arthritis (HCC) 07/04/2018   Stage 3 chronic kidney disease (HCC) 02/11/2019   Vitamin D deficiency 02/11/2019    Past Surgical History:  Procedure Laterality Date   ABDOMINAL HYSTERECTOMY     BREAST REDUCTION SURGERY Bilateral    CATARACT EXTRACTION, BILATERAL     INCISION AND DRAINAGE BREAST ABSCESS Left    REDUCTION MAMMAPLASTY     REPLACEMENT TOTAL KNEE Bilateral    TONSILLECTOMY AND ADENOIDECTOMY      Family History  Problem Relation  Age of Onset   Stroke Mother    Hyperlipidemia Mother    Arthritis Mother    Heart disease Father    Arthritis Sister    Bipolar disorder Son    Healthy Son    Gallbladder disease Maternal Grandmother        radiation   GER disease Maternal Grandmother    Heart disease Paternal Uncle        x 7   Colon cancer Neg Hx    Esophageal cancer Neg Hx    Rectal cancer  Neg Hx    Stomach cancer Neg Hx    Cancer Neg Hx      Social History   Tobacco Use   Smoking status: Former    Packs/day: 0.10    Years: 3.00    Pack years: 0.30    Types: Cigarettes    Quit date: 1970    Years since quitting: 52.9   Smokeless tobacco: Never  Vaping Use   Vaping Use: Never used  Substance Use Topics   Alcohol use: Not Currently   Drug use: Never    Prior to Admission medications   Medication Sig Start Date End Date Taking? Authorizing Provider  albuterol (VENTOLIN HFA) 108 (90 Base) MCG/ACT inhaler Inhale 2 puffs into the lungs every 6 (six) hours as needed for wheezing or shortness of breath. Patient taking differently: Inhale 2 puffs into the lungs as needed for wheezing or shortness of breath. 10/22/19   Hilts, Michael, MD  allopurinol (ZYLOPRIM) 100 MG tablet TAKE 1 TABLET BY MOUTH EVERY DAY 10/03/21   [place], Blondell Reveal, MD  aspirin 81 MG EC tablet Take 81 mg by mouth daily. Swallow whole.    [provider]  betamethasone valerate ointment (VALISONE) 0.1 % Place in a thin lay to affect area of the vulva twice daily for 2 weeks and then apply in a thin layer to the vulva twice a week at bedtime for maintenance. 05/29/21   Nunzio Cobbs, MD  budesonide-formoterol [facility]) 160-4.5 MCG/ACT inhaler Inhale 2 puffs into the lungs as needed. 12/02/20   Hilts, Legrand Como, MD  ciclopirox (PENLAC) 8 % solution Apply topically at bedtime. Apply over nail and surrounding skin. Apply daily over previous coat. After seven (7) days, may remove with alcohol and continue cycle. 04/25/21   Trula Slade, DPM  Cyanocobalamin ([employer] VITAMIN B-12) 5000 MCG SUBL Place under the tongue daily.    [provider]  levothyroxine (SYNTHROID) 88 MCG tablet TAKE 1 TABLET DAILY BEFORE BREAKFAST 12/04/21   Early, Coralee Pesa, NP  metoprolol tartrate (LOPRESSOR) 25 MG tablet TAKE 1 TABLET BY MOUTH TWICE A DAY 02/15/21   Hilts, Legrand Como, MD  montelukast (SINGULAIR) 10 MG  tablet TAKE 1 TABLET BY MOUTH EVERYDAY AT BEDTIME 10/03/21   [place], Blondell Reveal, MD  Multiple Vitamin (MULTIVITAMIN) tablet Take 1 tablet by mouth daily.    [provider]  omeprazole (PRILOSEC) 40 MG capsule TAKE 1 CAPSULE (40 MG TOTAL) BY MOUTH DAILY 30 MINS BEFORE BREAKFAST 06/29/21   Gatha Mayer, MD  traMADol (ULTRAM) 50 MG tablet Take 1 tablet (50 mg total) by mouth every 6 (six) hours as needed. 10/24/21   de [place], Raymond J, MD    Current Facility-Administered Medications  Medication Dose Route Frequency Provider Last Rate Last Admin   0.9 %  sodium chloride infusion   Intravenous Continuous Elnora Morrison, MD 125 mL/hr at 12/08/21 0909 New Bag at 12/08/21 [phone]  morphine 4 MG/ML injection 4 mg  4 mg Intravenous Once Delora Fuel, MD        Allergies as of 12/08/2021 - Review Complete 12/08/2021  Allergen Reaction Noted   Other  10/13/2018    Review of Systems:    Constitutional: No weight loss, fever, chills, weakness or fatigue HEENT: Eyes: No change in vision               Ears, Nose, Throat:  No change in hearing or congestion Skin: No rash or itching Cardiovascular: No chest pain, chest pressure or palpitations   Respiratory: No SOB or cough Gastrointestinal: See HPI and otherwise negative Genitourinary: No dysuria or change in urinary frequency Neurological: No headache, dizziness or syncope Musculoskeletal: No new muscle or joint pain Hematologic: No bleeding or bruising Psychiatric: No history of depression or anxiety     Physical Exam:  Vital signs in last 24 hours: Temp:  [98.6 F (37 C)-99.5 F (37.5 C)] 99.5 F (37.5 C) (12/16 1230) Pulse Rate:  [72-83] 72 (12/16 1211) Resp:  [17-24] 17 (12/16 1211) BP: (131-166)/(56-114) 153/64 (12/16 1211) SpO2:  [84 %-96 %] 96 % (12/16 1211) FiO2 (%):  [28 %] 28 % (12/16 0608) Weight:  [79.4 kg] 79.4 kg (12/16 0606)    General:   Pleasant, well developed female in no acute distress Head:  Normocephalic  and atraumatic. Eyes: sclerae anicteric,conjunctive pink, left anterior eye lid with erythema, slight tenderness, no warmth.  Heart:  regular rate and rhythm, no murmurs or gallops Pulm: Clear anteriorly; no wheezing Abdomen:  Soft, Obese AB, skin exam normal, Normal bowel sounds. mild tenderness in the epigastrium. Without guarding and Without rebound, without hepatomegaly.  Extremities:  Without edema. Msk:  Symmetrical without gross deformities. Peripheral pulses intact.  Neurologic:  Alert and  oriented x4;  grossly normal neurologically.  Skin:   Dry and intact without significant lesions or rashes. Psychiatric: Demonstrates good judgement and reason without abnormal affect or behaviors.  LAB RESULTS: Recent Labs    12/08/21 0627 12/08/21 1226  WBC 8.7  --   HGB 13.8 12.6  HCT 44.7 37.0  PLT 249  --    BMET Recent Labs    12/08/21 0627 12/08/21 1226  NA 141 140  K 4.3 4.7  CL 104  --   CO2 26  --   GLUCOSE 151*  --   BUN 26*  --   CREATININE 1.32*  --   CALCIUM 9.4  --    LFT Recent Labs    12/08/21 0627  PROT 7.2  ALBUMIN 4.0  AST 678*  ALT 353*  ALKPHOS 155*  BILITOT 1.6*   PT/INR No results for input(s): LABPROT, INR in the last 72 hours.  STUDIES: CT ABDOMEN PELVIS W CONTRAST  Result Date: 12/08/2021 CLINICAL DATA:  Acute and nonlocalized abdominal pain EXAM: CT ABDOMEN AND PELVIS WITH CONTRAST TECHNIQUE: Multidetector CT imaging of the abdomen and pelvis was performed using the standard protocol following bolus administration of intravenous contrast. CONTRAST:  54mL OMNIPAQUE IOHEXOL 300 MG/ML  SOLN COMPARISON:  03/25/2020 FINDINGS: Lower chest:  No contributory findings. Hepatobiliary: No focal liver abnormality.Gallstones. Physiologically distended gallbladder and mildly dilated common bile duct (9 mm) with avid mucosal based enhancement of both structures. There is a 1 cm rounded structure in the distal CBD that matches the density of gallstones.  Pancreas: No acute finding or ductal dilatation. Spleen: Unremarkable. Adrenals/Urinary Tract: Negative adrenals. No hydronephrosis or stone. Symmetric atrophy and cortical lobulation. Unremarkable  bladder. Stomach/Bowel: No obstruction. No visible bowel inflammation. Distal colonic diverticula which are numerous. Vascular/Lymphatic: No acute vascular abnormality. Scattered atheromatous calcifications. No mass or adenopathy. Reproductive:Hysterectomy. Other: No ascites or pneumoperitoneum.  Small midline fatty hernia. Musculoskeletal: No acute abnormalities. Generalized thoracic and lumbar disc degeneration. Remote T12 compression fracture. IMPRESSION: Choledocholithiasis with distended gallbladder and common bile duct. Electronically Signed   By: Jorje Guild M.D.   On: 12/08/2021 07:58      Impression     Choledocholithiasis, no leukocytosis, patient hemodynamically stable. WBC 8.7 HGB 12.6 Platelets 249 AST 678 ALT 353  Alkphos 155 TBili 1.6 INR an lactic acid not gotten this admission CT images with clear choledocholithiasis and distended gallbladder and common bile duct No elevation of white blood cell count at this time History of normal EGD 2021  Elevated troponin, mildly elevated Echo 09/2018 EF 60-65%. Grade 1 diastolic dysfunction Personally history of CVA 2019 Cardiology has been consulted.  Elevated liver function Likely secondary to choledocholithiasis  Stage III chronic kidney disease  History of CVA 2019 Patient states this was after hysterectomy, had decreased hearing afterwards.  Hypertension  Hyperlipidemia    Plan    - get INR - Will start on IV Cipro 400 mg once daily -Will monitor CBC and CMET, repeat in AM -Continue supportive care -Continue pain control. -Continue IV hydration, can do clear liquids today, n.p.o. at midnight. - With history of CVA, flattening of T waves on EKG and slight troponin elevation, cardiology consult has been requested.   Appreciate their input. -If patient is started on heparin please notify GI as this needs to be stopped  6 hours prior to planned ERCP please -Plan for ERCP tomorrow -I thoroughly discussed procedure with the patient to include nature, alternatives, benefits, and risks (including but not limited to post ERCP pancreatitis, bleeding, infection, perforation, anesthesia/cardiac pulmonary complications).  Patient verbalized understanding and gave verbal consent to proceed with ERCP.  We will plan for general surgery consult as patient will likely need a cholecystectomy at some point.   Thank you for your kind consultation, we will continue to follow.  Alexis Shelton  12/08/2021, 1:27 PM    ________________________________________________________________________  Alexis Shelton GI MD note:  I personally examined the patient, reviewed the data and agree with the assessment and plan described above. She has been having intermittent episodes of epig pain and vomiting for quite a while and clearly has stones in her GB and a stone in her CBD with elevated liver tests.  I am planning ERCP tomorrow morning. We've asked for cardiology clearance given her slightly elevated troponin.  Will start Iv abx for now   Owens Loffler, MD [facility] Gastroenterology Pager [phone]

## 2021-12-08 NOTE — ED Triage Notes (Signed)
Pt arrives via [facility] from home, per medic report, having abdominal pain, started yesterday afternoon. None currently. Emesis x 3 en route. IV established in the left forearm, 4 zofran given, 12 lead unremarkable. HR 86, 176/80, cbg 191, 92% RA.

## 2021-12-08 NOTE — ED Notes (Signed)
Rn updated on changes before transport. Blood gas venous good. Ammonia levels were no good here and need to redrawn. In and out cath preformed 200 Ml post cath void. Oral temp was taken and slightly febrile at 99.5. Orders placed for rectal temp while [facility] was here and getting ready to move

## 2021-12-08 NOTE — ED Provider Notes (Signed)
[facility] EMERGENCY DEPT Provider Note   CSN: [id number] Arrival date & time: 12/08/21  0551     History Chief Complaint  Patient presents with   Abdominal Pain    Alexis Shelton is a 80 y.o. female.  The history is provided by the patient.  Abdominal Pain She has history of hypertension, hyperlipidemia, rheumatoid arthritis, chronic kidney disease, cholelithiasis and comes in because of upper abdominal pain and vomiting.  Pain is described as a tight feeling across the upper abdomen.  She has vomited 3 times.  Pain is not improved with emesis.  Nothing makes her pain better, nothing makes it worse.  She rates pain at 7/10.  She denies fever or chills but has broken out in sweats.  She cannot recall having had similar pains before.  She was brought in by ambulance where she was given ondansetron, but it has not helped her nausea.   Past Medical History:  Diagnosis Date   Arthritis    DDD (degenerative disc disease), lumbar 11/24/2018   Dizziness    Gallstones    Hearing loss    History of stroke involving cerebellum 10/13/2018   Hypercholesteremia    Hypertension    Hypothyroidism    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   Migraines    Mild intermittent asthma without complication [id number]   Primary osteoarthritis of both feet 11/24/2018   Rheumatoid arthritis ([place]) 07/04/2018   Stage 3 chronic kidney disease ([place]) 02/11/2019   Vitamin D deficiency 02/11/2019    Patient Active Problem List   Diagnosis Date Noted   Prediabetes 10/09/2021   Need for influenza vaccination 10/09/2021   Need for tetanus booster 10/09/2021   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   Laceration of auricle of ear, left, initial encounter 08/09/2021   Insomnia 08/09/2021   Obesity 08/19/2020   Proteinuria 08/19/2020   Vitamin D deficiency 02/11/2019   Stage 3 chronic kidney disease ([place]) 02/11/2019   Mild intermittent asthma without complication [id number]    Primary osteoarthritis of both hands 11/24/2018   Primary osteoarthritis of both feet 11/24/2018   DDD (degenerative disc disease), cervical 11/24/2018   DDD (degenerative disc disease), lumbar 11/24/2018   History of stroke involving cerebellum 10/13/2018   Gait abnormality 10/13/2018   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   Asthma 08/12/2018   Hypothyroidism 08/12/2018   Essential hypertension 07/04/2018   Hyperlipidemia 07/04/2018   Hyperglycemia 05/28/2017   Seasonal allergic rhinitis 05/28/2017   Gouty arthropathy 09/15/2010   Osteoarthrosis, generalized, involving multiple sites 09/15/2010   Esophagitis 08/30/2010    Past Surgical History:  Procedure Laterality Date   ABDOMINAL HYSTERECTOMY     BREAST REDUCTION SURGERY Bilateral    CATARACT EXTRACTION, BILATERAL     INCISION AND DRAINAGE BREAST ABSCESS Left    REDUCTION MAMMAPLASTY     REPLACEMENT TOTAL KNEE Bilateral    TONSILLECTOMY AND ADENOIDECTOMY       OB History     Gravida  4   Para  1   Term      Preterm      AB  2   Living  1      SAB  2   IAB      Ectopic      Multiple      Live Births              Family History  Problem Relation Age of Onset   Stroke Mother    Hyperlipidemia Mother  Arthritis Mother    Heart disease Father    Arthritis Sister    Bipolar disorder Son    Healthy Son    Gallbladder disease Maternal Grandmother        radiation   GER disease Maternal Grandmother    Heart disease Paternal Uncle        x 7   Colon cancer Neg Hx    Esophageal cancer Neg Hx    Rectal cancer Neg Hx    Stomach cancer Neg Hx    Cancer Neg Hx     Social History   Tobacco Use   Smoking status: Former    Packs/day: 0.10    Years: 3.00    Pack years: 0.30    Types: Cigarettes    Quit date: 1970    Years since quitting: 52.9   Smokeless tobacco: Never  Vaping Use   Vaping Use: Never used  Substance Use Topics   Alcohol use: Not Currently   Drug use: Never     Home Medications Prior to Admission medications   Medication Sig Start Date End Date Taking? Authorizing Provider  albuterol (VENTOLIN HFA) 108 (90 Base) MCG/ACT inhaler Inhale 2 puffs into the lungs every 6 (six) hours as needed for wheezing or shortness of breath. Patient taking differently: Inhale 2 puffs into the lungs as needed for wheezing or shortness of breath. 10/22/19   Hilts, Michael, MD  allopurinol (ZYLOPRIM) 100 MG tablet TAKE 1 TABLET BY MOUTH EVERY DAY 10/03/21   [place], Buren Kos, MD  aspirin 81 MG EC tablet Take 81 mg by mouth daily. Swallow whole.    [provider]  betamethasone valerate ointment (VALISONE) 0.1 % Place in a thin lay to affect area of the vulva twice daily for 2 weeks and then apply in a thin layer to the vulva twice a week at bedtime for maintenance. 05/29/21   Patton Salles, MD  budesonide-formoterol [name]) 160-4.5 MCG/ACT inhaler Inhale 2 puffs into the lungs as needed. 12/02/20   Hilts, Casimiro Needle, MD  ciclopirox (PENLAC) 8 % solution Apply topically at bedtime. Apply over nail and surrounding skin. Apply daily over previous coat. After seven (7) days, may remove with alcohol and continue cycle. 04/25/21   Vivi Barrack, DPM  Cyanocobalamin ([employer] VITAMIN B-12) 5000 MCG SUBL Place under the tongue daily.    [provider]  levothyroxine (SYNTHROID) 88 MCG tablet TAKE 1 TABLET DAILY BEFORE BREAKFAST 12/04/21   Early, Sung Amabile, NP  metoprolol tartrate (LOPRESSOR) 25 MG tablet TAKE 1 TABLET BY MOUTH TWICE A DAY 02/15/21   Hilts, Casimiro Needle, MD  montelukast (SINGULAIR) 10 MG tablet TAKE 1 TABLET BY MOUTH EVERYDAY AT BEDTIME 10/03/21   [place], Buren Kos, MD  Multiple Vitamin (MULTIVITAMIN) tablet Take 1 tablet by mouth daily.    [provider]  omeprazole (PRILOSEC) 40 MG capsule TAKE 1 CAPSULE (40 MG TOTAL) BY MOUTH DAILY 30 MINS BEFORE BREAKFAST 06/29/21   Iva Boop, MD  traMADol (ULTRAM) 50 MG tablet Take 1 tablet  (50 mg total) by mouth every 6 (six) hours as needed. 10/24/21   [place], Raymond J, MD    Allergies    Other  Review of Systems   Review of Systems  Gastrointestinal:  Positive for abdominal pain.  All other systems reviewed and are negative.  Physical Exam Updated Vital Signs BP (!) 166/64 (BP Location: Left Arm)    Pulse 82    Temp 98.6  F (37 C)    Resp (!) 22    Ht 5\' 6"  (1.676 m)    Wt 79.4 kg    SpO2 93%    BMI 28.25 kg/m   Physical Exam Vitals and nursing note reviewed.  80 year old female, appears uncomfortable, but is in no acute distress. Vital signs are significant for elevated blood pressure and slightly elevated respiratory rate. Oxygen saturation is 93%, which is normal. Head is normocephalic and atraumatic. PERRLA, EOMI. Oropharynx is clear. Neck is nontender and supple without adenopathy or JVD. Back is nontender and there is no CVA tenderness. Lungs are clear without rales, wheezes, or rhonchi. Chest is nontender. Heart has regular rate and rhythm without murmur. Abdomen is soft, flat, with mild tenderness diffusely.  Maximum tenderness is in the epigastric area.  There is no rebound or guarding.  Murphy sign is negative.  There are no masses or hepatosplenomegaly and peristalsis is hypoactive. Extremities have no cyanosis or edema, full range of motion is present. Skin is warm and dry without rash. Neurologic: Mental status is normal, cranial nerves are intact, moves all extremities equally.  ED Results / Procedures / Treatments   Labs (all labs ordered are listed, but only abnormal results are displayed) Labs Reviewed  LIPASE, BLOOD  COMPREHENSIVE METABOLIC PANEL  URINALYSIS, ROUTINE W REFLEX MICROSCOPIC  CBC WITH DIFFERENTIAL/PLATELET  TROPONIN I (HIGH SENSITIVITY)    EKG EKG Interpretation  Date/Time:  Friday December 08 2021 06:08:25 EST Ventricular Rate:  87 PR Interval:  142 QRS Duration: 86 QT Interval:  348 QTC Calculation: 418 R  Axis:   25 Text Interpretation: Normal sinus rhythm Normal ECG No old tracing to compare Confirmed by Delora Fuel ([id number]) on 12/08/2021 6:14:23 AM  Radiology No results found.  Procedures Procedures   Medications Ordered in ED Medications  lactated ringers bolus 1,000 mL (has no administration in time range)  prochlorperazine (COMPAZINE) injection 10 mg (has no administration in time range)  diphenhydrAMINE (BENADRYL) injection 25 mg (has no administration in time range)  morphine 4 MG/ML injection 4 mg (has no administration in time range)    ED Course  I have reviewed the triage vital signs and the nursing notes.  Pertinent labs & imaging results that were available during my care of the patient were reviewed by me and considered in my medical decision making (see chart for details).    MDM Rules/Calculators/A&P                         Abdominal pain of uncertain cause.  Exam is nonspecific, but am concerned about possible peptic ulcer disease, pancreatitis, diverticulitis, cholecystitis.  Old records are reviewed, and CT of abdomen and pelvis in April 2021 did show cholelithiasis as well as calcified aorta without aneurysm.  We will repeat CT of abdomen and pelvis, may need to get ultrasound to evaluate for cholecystitis.  In the meantime, she is given IV fluids, morphine, prochlorperazine.  ECG is unchanged from prior.  Screening labs are pending.    Labs show significant elevation of transaminases, mild elevation of alkaline phosphatase, mild elevation of bilirubin.  Renal insufficiency is present not significantly changed from baseline.  Lipase is also elevated at twice normal.  Strong suspicion for cholecystitis based on these labs.  I have reviewed the CT scan which shows clear cholelithiasis and slight thickening of the gallbladder wall but no obvious pericholecystic fluid.  Markedly dilated cystic duct and common bile duct  noted.  These readings are subject to final  interpretation by radiologist.  Patient will likely need to be admitted for urgent gastroenterology consultation.  Case is signed out to Dr. Reather Converse.  Final Clinical Impression(s) / ED Diagnoses Final diagnoses:  Upper abdominal pain  Elevated liver function tests  Elevated lipase  Renal insufficiency    Rx / DC Orders ED Discharge Orders     None        Delora Fuel, MD [id number] [phone]

## 2021-12-08 NOTE — ED Provider Notes (Signed)
Alexis Shelton is a 80 y.o. female with abdominal pain. Patient was signed out to by Dr. Preston Fleeting. CT abd/pelv returned with choledocholithiasis with distended gallbladder and CBD. GI paged. Dr. Ewing Schlein returned call and will plan to see patient if admitted to [facility]. He is unclear whether or not patient will be able to be placed on schedule today.  Awaiting call back from Hospitalist. Dr. Jodi Mourning was able to talk with Dr. Robb Matar who accepts patient for admission.  Given that patient has seen [facility] in the past, paged [employer] PA, [facility], who will see patient once admitted.   Stable for admission.    Sabino Dick, DO 12/08/21 3612    Blane Ohara, MD 12/12/21 0700

## 2021-12-08 NOTE — Consult Note (Signed)
Alexis Shelton [birth year]  [id number].    Requesting MD: Dr. Owens Loffler Chief Complaint/Reason for Consult: Choledocholithiasis  HPI: Alexis Shelton is a 80 y.o. 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  Patient reports that she has been having intermittent epigastric abdominal pain with associated nausea for the last several months.  She notes this has worsened over the last several weeks but the symptoms usually self resolve after a few hours. Yesterday she had moderate/severe epigastric abdominal pain with associated nausea and vomiting that did not resolve and prompted presentation to the emergency department. Symptoms usually are worse after PO intake. She reports associated chills.  No fever.  She underwent work-up and was found to have choledocholithiasis.  Cardiology has already been consulted for cardiac clearance and GI is planning for ERCP tomorrow pending this.  We were asked to see.   Patient lives independently at home.  She occasionally uses a cane to assist with ambulation as she has dizziness that she is followed by Neurology for.  She notes she quit smoking and high school.  No current tobacco use.  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  No illicit drug use.  She is retired.  Prior abdominal surgeries include hysterectomy.  She is on a baby aspirin daily, otherwise no additional antiplatelet/anticoagulation medications.  ROS: Review of Systems  Constitutional:  Positive for chills.  Respiratory:  Positive for cough and shortness of breath.   Cardiovascular:  Negative for chest pain and leg swelling.  Gastrointestinal:  Positive for abdominal pain, nausea and vomiting. Negative for constipation and diarrhea.  Genitourinary: Negative.   Psychiatric/Behavioral:  Negative for substance abuse.   All other systems reviewed and are  negative.  Family History  Problem Relation Age of Onset   Stroke Mother    Hyperlipidemia Mother    Arthritis Mother    Heart disease Father    Arthritis Sister    Bipolar disorder Son    Healthy Son    Gallbladder disease Maternal Grandmother        radiation   GER disease Maternal Grandmother    Heart disease Paternal Uncle        x 7   Colon cancer Neg Hx    Esophageal cancer Neg Hx    Rectal cancer Neg Hx    Stomach cancer Neg Hx    Cancer Neg Hx     Past Medical History:  Diagnosis Date   Arthritis    DDD (degenerative disc disease), lumbar 11/24/2018   Dizziness    Elevated liver function tests    Gallstones    Hearing loss    History of stroke involving cerebellum 10/13/2018   Hypercholesteremia    Hypertension    Hypothyroidism    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   Migraines    Mild intermittent asthma without complication [id number]   Primary osteoarthritis of both feet 11/24/2018   Rheumatoid arthritis ([place]) 07/04/2018   Stage 3 chronic kidney disease ([place]) 02/11/2019   Vitamin D deficiency 02/11/2019    Past Surgical History:  Procedure Laterality Date   ABDOMINAL HYSTERECTOMY     BREAST REDUCTION SURGERY Bilateral    CATARACT EXTRACTION, BILATERAL     INCISION AND DRAINAGE BREAST ABSCESS Left    REDUCTION MAMMAPLASTY     REPLACEMENT TOTAL KNEE Bilateral  TONSILLECTOMY AND ADENOIDECTOMY      Social History:  reports that she quit smoking about 52 years ago. Her smoking use included cigarettes. She has a 0.30 pack-year smoking history. She has never used smokeless tobacco. She reports that she does not currently use alcohol. She reports that she does not use drugs.  Allergies:  Allergies  Allergen Reactions   Other     Wood - burning fireplaces    Medications Prior to Admission  Medication Sig Dispense Refill   albuterol (VENTOLIN HFA) 108 (90 Base) MCG/ACT inhaler Inhale 2 puffs into the lungs every 6 (six) hours as needed for  wheezing or shortness of breath. 54 g 3   allopurinol (ZYLOPRIM) 100 MG tablet TAKE 1 TABLET BY MOUTH EVERY DAY 90 tablet 0   aspirin 81 MG EC tablet Take 81 mg by mouth daily. Swallow whole.     budesonide-formoterol (SYMBICORT) 160-4.5 MCG/ACT inhaler Inhale 2 puffs into the lungs as needed. (Patient taking differently: Inhale 2 puffs into the lungs daily as needed (sob/wheezing).) 30.6 g 3   levothyroxine (SYNTHROID) 88 MCG tablet TAKE 1 TABLET DAILY BEFORE BREAKFAST 30 tablet 0   metoprolol tartrate (LOPRESSOR) 25 MG tablet TAKE 1 TABLET BY MOUTH TWICE A DAY 180 tablet 2   montelukast (SINGULAIR) 10 MG tablet TAKE 1 TABLET BY MOUTH EVERYDAY AT BEDTIME 90 tablet 0   Multiple Vitamin (MULTIVITAMIN) tablet Take 1 tablet by mouth daily.     omeprazole (PRILOSEC) 40 MG capsule TAKE 1 CAPSULE (40 MG TOTAL) BY MOUTH DAILY 30 MINS BEFORE BREAKFAST (Patient taking differently: 40 mg daily as needed (indigestion).) 90 capsule 3   traMADol (ULTRAM) 50 MG tablet Take 1 tablet (50 mg total) by mouth every 6 (six) hours as needed. (Patient taking differently: Take 50 mg by mouth every 6 (six) hours as needed for moderate pain or severe pain.) 90 tablet 0   betamethasone valerate ointment (VALISONE) 0.1 % Place in a thin lay to affect area of the vulva twice daily for 2 weeks and then apply in a thin layer to the vulva twice a week at bedtime for maintenance. (Patient not taking: Reported on 12/08/2021) 45 g 0   ciclopirox (PENLAC) 8 % solution Apply topically at bedtime. Apply over nail and surrounding skin. Apply daily over previous coat. After seven (7) days, may remove with alcohol and continue cycle. (Patient not taking: Reported on 12/08/2021) 6.6 mL 4     Physical Exam: Blood pressure (!) 128/48, pulse 67, temperature 98.5 F (36.9 C), temperature source Oral, resp. rate 18, height 5\' 6"  (1.676 m), weight 79.4 kg, SpO2 97 %. General: pleasant, WD/WN white female who is laying in bed in NAD HEENT: head  is normocephalic, atraumatic.  Sclera are noninjected.  PERRL.  Ears and nose without any masses or lesions.  Mouth is pink and moist. Dentition fair Heart: regular, rate, and rhythm.  Normal s1,s2. No obvious murmurs, gallops, or rubs noted.  Palpable pedal pulses bilaterally  Lungs: CTAB, no wheezes, rhonchi, or rales noted.  Respiratory effort nonlabored Abd:  Soft, ND, epigastric tenderness without peritonitis, +BS, no masses, hernias, or organomegaly. Prior pfannenstiel incision well healed. MS: no BUE/BLE edema, calves soft and nontender Skin: warm and dry with no masses, lesions, or rashes Psych: A&Ox4 with an appropriate affect Neuro: cranial nerves grossly intact, equal strength in BUE/BLE bilaterally, normal speech, thought process intact, moves all extremities, gait not assessed   Results for orders placed or performed during the hospital  encounter of 12/08/21 (from the past 48 hour(s))  Lipase, blood     Status: Abnormal   Collection Time: 12/08/21  6:27 AM  Result Value Ref Range   Lipase 103 (H) 11 - 51 U/L    Comment: Performed at [facility], [place], [place] [address]  Comprehensive metabolic panel     Status: Abnormal   Collection Time: 12/08/21  6:27 AM  Result Value Ref Range   Sodium 141 135 - 145 mmol/L   Potassium 4.3 3.5 - 5.1 mmol/L   Chloride 104 98 - 111 mmol/L   CO2 26 22 - 32 mmol/L   Glucose, Bld 151 (H) 70 - 99 mg/dL    Comment: Glucose reference range applies only to samples taken after fasting for at least 8 hours.   BUN 26 (H) 8 - 23 mg/dL   Creatinine, Ser 1.32 (H) 0.44 - 1.00 mg/dL   Calcium 9.4 8.9 - 10.3 mg/dL   Total Protein 7.2 6.5 - 8.1 g/dL   Albumin 4.0 3.5 - 5.0 g/dL   AST 678 (H) 15 - 41 U/L   ALT 353 (H) 0 - 44 U/L   Alkaline Phosphatase 155 (H) 38 - 126 U/L   Total Bilirubin 1.6 (H) 0.3 - 1.2 mg/dL   GFR, Estimated 40 (L) >60 mL/min    Comment: (NOTE) Calculated using the CKD-EPI Creatinine  Equation (2021)    Anion gap 11 5 - 15    Comment: Performed at [facility], [place], [place] [address]  CBC with Differential     Status: Abnormal   Collection Time: 12/08/21  6:27 AM  Result Value Ref Range   WBC 8.7 4.0 - 10.5 K/uL   RBC 4.80 3.87 - 5.11 MIL/uL   Hemoglobin 13.8 12.0 - 15.0 g/dL   HCT 44.7 36.0 - 46.0 %   MCV 93.1 80.0 - 100.0 fL   MCH 28.8 26.0 - 34.0 pg   MCHC 30.9 30.0 - 36.0 g/dL   RDW 17.0 (H) 11.5 - 15.5 %   Platelets 249 150 - 400 K/uL   nRBC 0.0 0.0 - 0.2 %   Neutrophils Relative % 86 %   Neutro Abs 7.6 1.7 - 7.7 K/uL   Lymphocytes Relative 8 %   Lymphs Abs 0.7 0.7 - 4.0 K/uL   Monocytes Relative 4 %   Monocytes Absolute 0.4 0.1 - 1.0 K/uL   Eosinophils Relative 1 %   Eosinophils Absolute 0.0 0.0 - 0.5 K/uL   Basophils Relative 0 %   Basophils Absolute 0.0 0.0 - 0.1 K/uL   Immature Granulocytes 1 %   Abs Immature Granulocytes 0.04 0.00 - 0.07 K/uL    Comment: Performed at [facility], [place], [place] [address]  Troponin I (High Sensitivity)     Status: None   Collection Time: 12/08/21  6:27 AM  Result Value Ref Range   Troponin I (High Sensitivity) 12 <18 ng/L    Comment: (NOTE) Elevated high sensitivity troponin I (hsTnI) values and significant  changes across serial measurements may suggest ACS but many other  chronic and acute conditions are known to elevate hsTnI results.  Refer to the "Links" section for chest pain algorithms and additional  guidance. Performed at [facility], [address] [place], [place] [address]   Resp Panel by RT-PCR (Flu A&B, Covid) Nasopharyngeal Swab     Status: None   Collection Time: 12/08/21  8:30 AM  Specimen: Nasopharyngeal Swab; Nasopharyngeal(NP) swabs in vial transport medium  Result Value Ref Range   SARS Coronavirus 2 by RT PCR NEGATIVE NEGATIVE    Comment: (NOTE) SARS-CoV-2 target nucleic acids are NOT  DETECTED.  The SARS-CoV-2 RNA is generally detectable in upper respiratory specimens during the acute phase of infection. The lowest concentration of SARS-CoV-2 viral copies this assay can detect is 138 copies/mL. A negative result does not preclude SARS-Cov-2 infection and should not be used as the sole basis for treatment or other patient management decisions. A negative result may occur with  improper specimen collection/handling, submission of specimen other than nasopharyngeal swab, presence of viral mutation(s) within the areas targeted by this assay, and inadequate number of viral copies(<138 copies/mL). A negative result must be combined with clinical observations, patient history, and epidemiological information. The expected result is Negative.  Fact Sheet for Patients:  BloggerCourse.com  Fact Sheet for Healthcare Providers:  SeriousBroker.it  This test is no t yet approved or cleared by the [place] FDA and  has been authorized for detection and/or diagnosis of SARS-CoV-2 by FDA under an Emergency Use Authorization (EUA). This EUA will remain  in effect (meaning this test can be used) for the duration of the COVID-19 declaration under Section 564(b)(1) of the Act, 21 U.S.C.section 360bbb-3(b)(1), unless the authorization is terminated  or revoked sooner.       Influenza A by PCR NEGATIVE NEGATIVE   Influenza B by PCR NEGATIVE NEGATIVE    Comment: (NOTE) The Xpert Xpress SARS-CoV-2/FLU/RSV plus assay is intended as an aid in the diagnosis of influenza from Nasopharyngeal swab specimens and should not be used as a sole basis for treatment. Nasal washings and aspirates are unacceptable for Xpert Xpress SARS-CoV-2/FLU/RSV testing.  Fact Sheet for Patients: BloggerCourse.com  Fact Sheet for Healthcare Providers: SeriousBroker.it  This test is not yet approved or  cleared by the [place] FDA and has been authorized for detection and/or diagnosis of SARS-CoV-2 by FDA under an Emergency Use Authorization (EUA). This EUA will remain in effect (meaning this test can be used) for the duration of the COVID-19 declaration under Section 564(b)(1) of the Act, 21 U.S.C. section 360bbb-3(b)(1), unless the authorization is terminated or revoked.  Performed at [facility], [address], [place], [place] [address]   Troponin I (High Sensitivity)     Status: Abnormal   Collection Time: 12/08/21 10:20 AM  Result Value Ref Range   Troponin I (High Sensitivity) 32 (H) <18 ng/L    Comment: (NOTE) Elevated high sensitivity troponin I (hsTnI) values and significant  changes across serial measurements may suggest ACS but many other  chronic and acute conditions are known to elevate hsTnI results.  Refer to the Links section for chest pain algorithms and additional  guidance. Performed at [facility], [address] [place], [place] [address]   Urinalysis, Routine w reflex microscopic Urine, Catheterized     Status: Abnormal   Collection Time: 12/08/21 12:15 PM  Result Value Ref Range   Color, Urine YELLOW YELLOW   APPearance CLEAR CLEAR   Specific Gravity, Urine >1.046 (H) 1.005 - 1.030   pH 5.5 5.0 - 8.0   Glucose, UA NEGATIVE NEGATIVE mg/dL   Hgb urine dipstick NEGATIVE NEGATIVE   Bilirubin Urine NEGATIVE NEGATIVE   Ketones, ur NEGATIVE NEGATIVE mg/dL   Protein, ur 30 (A) NEGATIVE mg/dL   Nitrite NEGATIVE NEGATIVE   Leukocytes,Ua NEGATIVE NEGATIVE   RBC / HPF 0-5 0 - 5  RBC/hpf   WBC, UA 0-5 0 - 5 WBC/hpf   Squamous Epithelial / LPF 0-5 0 - 5   Mucus PRESENT     Comment: Performed at [facility], [address], [place], [place] [address]  I-Stat venous blood gas, ED     Status: Abnormal   Collection Time: 12/08/21 12:26 PM  Result Value Ref Range   pH, Ven 7.310 7.250 - 7.430    pCO2, Ven 56.3 44.0 - 60.0 mmHg   pO2, Ven 28.0 (LL) 32.0 - 45.0 mmHg   Bicarbonate 28.2 (H) 20.0 - 28.0 mmol/L   TCO2 30 22 - 32 mmol/L   O2 Saturation 44.0 %   Acid-Base Excess 1.0 0.0 - 2.0 mmol/L   Sodium 140 135 - 145 mmol/L   Potassium 4.7 3.5 - 5.1 mmol/L   Calcium, Ion 1.26 1.15 - 1.40 mmol/L   HCT 37.0 36.0 - 46.0 %   Hemoglobin 12.6 12.0 - 15.0 g/dL   Patient temperature 99.5 F    Collection site IV start    Drawn by Nurse    Sample type VENOUS    Comment NOTIFIED PHYSICIAN    CT ABDOMEN PELVIS W CONTRAST  Result Date: 12/08/2021 CLINICAL DATA:  Acute and nonlocalized abdominal pain EXAM: CT ABDOMEN AND PELVIS WITH CONTRAST TECHNIQUE: Multidetector CT imaging of the abdomen and pelvis was performed using the standard protocol following bolus administration of intravenous contrast. CONTRAST:  62mL OMNIPAQUE IOHEXOL 300 MG/ML  SOLN COMPARISON:  03/25/2020 FINDINGS: Lower chest:  No contributory findings. Hepatobiliary: No focal liver abnormality.Gallstones. Physiologically distended gallbladder and mildly dilated common bile duct (9 mm) with avid mucosal based enhancement of both structures. There is a 1 cm rounded structure in the distal CBD that matches the density of gallstones. Pancreas: No acute finding or ductal dilatation. Spleen: Unremarkable. Adrenals/Urinary Tract: Negative adrenals. No hydronephrosis or stone. Symmetric atrophy and cortical lobulation. Unremarkable bladder. Stomach/Bowel: No obstruction. No visible bowel inflammation. Distal colonic diverticula which are numerous. Vascular/Lymphatic: No acute vascular abnormality. Scattered atheromatous calcifications. No mass or adenopathy. Reproductive:Hysterectomy. Other: No ascites or pneumoperitoneum.  Small midline fatty hernia. Musculoskeletal: No acute abnormalities. Generalized thoracic and lumbar disc degeneration. Remote T12 compression fracture. IMPRESSION: Choledocholithiasis with distended gallbladder and common  bile duct. Electronically Signed   By: Jorje Guild M.D.   On: 12/08/2021 07:58    Anti-infectives (From admission, onward)    Start     Dose/Rate Route Frequency Ordered Stop   12/08/21 1600  ciprofloxacin (CIPRO) IVPB 400 mg        400 mg 200 mL/hr over 60 Minutes Intravenous Every 12 hours 12/08/21 1459     12/08/21 1530  ciprofloxacin (CIPRO) IVPB 400 mg  Status:  Discontinued        400 mg 200 mL/hr over 60 Minutes Intravenous Every 24 hours 12/08/21 1431 12/08/21 1459       Assessment/Plan Choledocholithiasis - Per notes patient is planned for ERCP tomorrow pending cardiology clearance - If cleared by cardiology, would recommend laparoscopic cholecystectomy during admission - Trend labs -We will follow with you  FEN - CLD. NPO at midnight for ERCP. IVF per TRH VTE - SCDs, okay for chemical prophylaxis from a general surgery standpoint ID - She is already on Cipro. GB was distended on CT but there was no obvious GB thickening or pericholecystic fluid noted on CT. WBC is wnl and she is afebrile. She does not need any abx at this time from our standpoint but will  defer to GI as they initiated them.  Elevated Tn - denies CP. Cardiology has already been consulted HTN HLD Hypothyroidism CKD3  Prior CVA (2019)   Jillyn Ledger, [facility] 12/08/2021, 3:55 PM Please see Amion for pager number during day hours 7:00am-4:30pm

## 2021-12-08 NOTE — ED Notes (Signed)
Pt more lethargic than previously. Pt is answering questions, and  following commands. GCS 15 however, pt is al tiile "mush mouthed" Md Notided

## 2021-12-08 NOTE — H&P (Signed)
History and Physical    Han Lysne MGQ:[id number] DOB: Apr 26, 1941 DOA: 12/08/2021  PCP: [place], Raymond J, MD   Patient coming from: Home.  I have personally briefly reviewed patient's old medical records in [facility]  Chief Complaint: Abdominal pain.  HPI: Alexis Shelton is a 80 y.o. female with medical history significant of osteoarthritis, lumbar DDD, unspecified dizziness, gallstones, hearing loss, cerebellar CVA, hypertension, hyperlipidemia, hypothyroid [place], gout, migraine headaches, mild intermittent asthma, osteoarthritis, history of rheumatoid arthritis, stage III CKD, vitamin D deficiency who is coming to the emergency department due to abdominal pain that she has had on/off for about 2 years.  However, the frequency of this pain has increased over the last few months, more so in the last few weeks.  Yesterday afternoon, the patient stated that her pain got very intense.  She had 3 episodes of emesis after that.  This morning she was unable to tolerate the pain and called EMS.  She had a normal BM yesterday.  She has some flocculence.  No flank pain, dysuria, frequency or hematuria.  No fever, chills or malaise.  Denied sore throat, chest pain, palpitations, diaphoresis, PND, orthopnea or recent pitting edema of the lower extremities.  ED Course: Initial vital signs were temperature 98.6 F, pulse 81, respiration 18, BP 166/64 O2 sat 84% with nasal cannula.  Patient received 1000 mL of LR bolus and 500 mL of NS bolus in the emergency department.  Lab work: Urinalysis showed a specific gravity of more than 1.046 and proteinuria 30 mg/dL.  He was otherwise unremarkable.  CBC is her white count of 8.7, hemoglobin 13.8 g/dL and platelets 267.  PT and INR were normal.  CMP showed normal electrolytes.  Glucose 151, BUN 26 and creatinine 1.32 mg/dL.  Total protein 7.2 and albumin 4.0 g/dL.  AST was 678, ALP 353 and alkaline phosphatase 155 units/L.  Total bilirubin was  1.6 mg/dL.  Lipase was 103 units/L.  Troponin was 32 and then 34 mg/dL.  Imaging: CT abdomen/pelvis with contrast showed choledocholithiasis with distended gallbladder and CBD.  Please see images and full radiology report for further details.  Review of Systems: As per HPI otherwise all other systems reviewed and are negative.  Past Medical History:  Diagnosis Date   Arthritis    DDD (degenerative disc disease), lumbar 11/24/2018   Dizziness    Elevated liver function tests    Gallstones    Hearing loss    History of stroke involving cerebellum 10/13/2018   Hypercholesteremia    Hypertension    Hypothyroidism    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   Migraines    Mild intermittent asthma without complication 02/11/2019   Primary osteoarthritis of both feet 11/24/2018   Rheumatoid arthritis (HCC) 07/04/2018   Stage 3 chronic kidney disease (HCC) 02/11/2019   Vitamin D deficiency 02/11/2019   Past Surgical History:  Procedure Laterality Date   ABDOMINAL HYSTERECTOMY     BREAST REDUCTION SURGERY Bilateral    CATARACT EXTRACTION, BILATERAL     INCISION AND DRAINAGE BREAST ABSCESS Left    REDUCTION MAMMAPLASTY     REPLACEMENT TOTAL KNEE Bilateral    TONSILLECTOMY AND ADENOIDECTOMY     Social History  reports that she quit smoking about 52 years ago. Her smoking use included cigarettes. She has a 0.30 pack-year smoking history. She has never used smokeless tobacco. She reports that she does not currently use alcohol. She reports that she does not use drugs.  Allergies  Allergen Reactions   Other     Wood - burning fireplaces    Family History  Problem Relation Age of Onset   Stroke Mother    Hyperlipidemia Mother    Arthritis Mother    Heart disease Father    Arthritis Sister    Bipolar disorder Son    Healthy Son    Gallbladder disease Maternal Grandmother        radiation   GER disease Maternal Grandmother    Heart disease Paternal Uncle        x 7   Colon  cancer Neg Hx    Esophageal cancer Neg Hx    Rectal cancer Neg Hx    Stomach cancer Neg Hx    Cancer Neg Hx    Prior to Admission medications   Medication Sig Start Date End Date Taking? Authorizing Provider  albuterol (VENTOLIN HFA) 108 (90 Base) MCG/ACT inhaler Inhale 2 puffs into the lungs every 6 (six) hours as needed for wheezing or shortness of breath. Patient taking differently: Inhale 2 puffs into the lungs as needed for wheezing or shortness of breath. 10/22/19   Hilts, Michael, MD  allopurinol (ZYLOPRIM) 100 MG tablet TAKE 1 TABLET BY MOUTH EVERY DAY 10/03/21   [place], Blondell Reveal, MD  aspirin 81 MG EC tablet Take 81 mg by mouth daily. Swallow whole.    [provider]  betamethasone valerate ointment (VALISONE) 0.1 % Place in a thin lay to affect area of the vulva twice daily for 2 weeks and then apply in a thin layer to the vulva twice a week at bedtime for maintenance. 05/29/21   Nunzio Cobbs, MD  budesonide-formoterol [facility]) 160-4.5 MCG/ACT inhaler Inhale 2 puffs into the lungs as needed. 12/02/20   Hilts, Legrand Como, MD  ciclopirox (PENLAC) 8 % solution Apply topically at bedtime. Apply over nail and surrounding skin. Apply daily over previous coat. After seven (7) days, may remove with alcohol and continue cycle. 04/25/21   Trula Slade, DPM  Cyanocobalamin ([employer] VITAMIN B-12) 5000 MCG SUBL Place under the tongue daily.    [provider]  levothyroxine (SYNTHROID) 88 MCG tablet TAKE 1 TABLET DAILY BEFORE BREAKFAST 12/04/21   Early, Coralee Pesa, NP  metoprolol tartrate (LOPRESSOR) 25 MG tablet TAKE 1 TABLET BY MOUTH TWICE A DAY 02/15/21   Hilts, Legrand Como, MD  montelukast (SINGULAIR) 10 MG tablet TAKE 1 TABLET BY MOUTH EVERYDAY AT BEDTIME 10/03/21   [place], Blondell Reveal, MD  Multiple Vitamin (MULTIVITAMIN) tablet Take 1 tablet by mouth daily.    [provider]  omeprazole (PRILOSEC) 40 MG capsule TAKE 1 CAPSULE (40 MG TOTAL) BY MOUTH DAILY 30 MINS  BEFORE BREAKFAST 06/29/21   Gatha Mayer, MD  traMADol (ULTRAM) 50 MG tablet Take 1 tablet (50 mg total) by mouth every 6 (six) hours as needed. 10/24/21   [place], Raymond J, MD   Physical Exam: Vitals:   12/08/21 0930 12/08/21 1211 12/08/21 1230 12/08/21 1344  BP: (!) 155/56 (!) 153/64  (!) 128/48  Pulse: 83 72  67  Resp: (!) 24 17  18   Temp:  99.5 F (37.5 C) 99.5 F (37.5 C) 98.5 F (36.9 C)  TempSrc:  Oral Oral Oral  SpO2: 95% 96%  97%  Weight:      Height:       Constitutional: NAD, calm, comfortable Eyes: PERRL, lids and conjunctivae normal ENMT: Mucous membranes are moist. Posterior pharynx clear of  any exudate or lesions. Neck: normal, supple, no masses, no thyromegaly Respiratory: clear to auscultation bilaterally, no wheezing, no crackles. Normal respiratory effort. No accessory muscle use.  Cardiovascular: Regular rate and rhythm, no murmurs / rubs / gallops. No extremity edema. 2+ pedal pulses. No carotid bruits.  Abdomen: No distention.  Soft, mild RUQ and epigastric tenderness, no guarding or rebound, no masses palpated. No hepatosplenomegaly. Bowel sounds positive.  Musculoskeletal: no clubbing / cyanosis.  Good ROM, no contractures. Normal muscle tone.  Skin: Healing scratches on right sided mid thoracic area (cat scratch from several days ago)..  No other obvious acute lesions seen. Neurologic: CN 2-12 grossly intact. Sensation intact, DTR normal. Strength 5/5 in all 4.  Psychiatric: Normal judgment and insight. Alert and oriented x 3. Normal mood.   Labs on Admission: I have personally reviewed following labs and imaging studies  CBC: Recent Labs  Lab 12/08/21 0627 12/08/21 1226  WBC 8.7  --   NEUTROABS 7.6  --   HGB 13.8 12.6  HCT 44.7 37.0  MCV 93.1  --   PLT 249  --     Basic Metabolic Panel: Recent Labs  Lab 12/08/21 0627 12/08/21 1226  NA 141 140  K 4.3 4.7  CL 104  --   CO2 26  --   GLUCOSE 151*  --   BUN 26*  --   CREATININE 1.32*  --    CALCIUM 9.4  --     GFR: Estimated Creatinine Clearance: 34.9 mL/min (A) (by C-G formula based on SCr of 1.32 mg/dL (H)).  Liver Function Tests: Recent Labs  Lab 12/08/21 0627  AST 678*  ALT 353*  ALKPHOS 155*  BILITOT 1.6*  PROT 7.2  ALBUMIN 4.0    Urine analysis:    Component Value Date/Time   COLORURINE YELLOW 12/08/2021 1215   APPEARANCEUR CLEAR 12/08/2021 1215   LABSPEC >1.046 (H) 12/08/2021 1215   PHURINE 5.5 12/08/2021 1215   [place] 12/08/2021 1215   [place] 12/08/2021 1215   [place] 12/08/2021 1215   [place] 12/08/2021 1215   PROTEINUR 30 (A) 12/08/2021 1215   NITRITE NEGATIVE 12/08/2021 1215   LEUKOCYTESUR NEGATIVE 12/08/2021 1215    Radiological Exams on Admission: CT ABDOMEN PELVIS W CONTRAST  Result Date: 12/08/2021 CLINICAL DATA:  Acute and nonlocalized abdominal pain EXAM: CT ABDOMEN AND PELVIS WITH CONTRAST TECHNIQUE: Multidetector CT imaging of the abdomen and pelvis was performed using the standard protocol following bolus administration of intravenous contrast. CONTRAST:  67mL OMNIPAQUE IOHEXOL 300 MG/ML  SOLN COMPARISON:  03/25/2020 FINDINGS: Lower chest:  No contributory findings. Hepatobiliary: No focal liver abnormality.Gallstones. Physiologically distended gallbladder and mildly dilated common bile duct (9 mm) with avid mucosal based enhancement of both structures. There is a 1 cm rounded structure in the distal CBD that matches the density of gallstones. Pancreas: No acute finding or ductal dilatation. Spleen: Unremarkable. Adrenals/Urinary Tract: Negative adrenals. No hydronephrosis or stone. Symmetric atrophy and cortical lobulation. Unremarkable bladder. Stomach/Bowel: No obstruction. No visible bowel inflammation. Distal colonic diverticula which are numerous. Vascular/Lymphatic: No acute vascular abnormality. Scattered atheromatous calcifications. No mass or adenopathy. Reproductive:Hysterectomy. Other: No  ascites or pneumoperitoneum.  Small midline fatty hernia. Musculoskeletal: No acute abnormalities. Generalized thoracic and lumbar disc degeneration. Remote T12 compression fracture. IMPRESSION: Choledocholithiasis with distended gallbladder and common bile duct. Electronically Signed   By: Jorje Guild M.D.   On: 12/08/2021 07:58    EKG: Independently reviewed.  Vent. rate 87 BPM PR interval  142 ms QRS duration 86 ms QT/QTcB 348/418 ms P-R-T axes 63 25 74 Normal sinus rhythm Normal ECG  Assessment/Plan Principal Problem:   Elevated liver function tests In the setting of   Choledocholithiasis Observation/telemetry. Continue IV fluids. Analgesics as needed. Antiemetics as needed. Pantoprazole 40 mg IVP every 24 hours. On Cipro 400 mg IVPB every 12 hours. Follow-up CBC and CMP in AM. Gastroenterology consult appreciated. General surgery consult appreciated.  Active Problems:   Mild intermittent asthma Continue Dulera 2 puffs twice daily. Continue Singulair 10 mg p.o. bedtime. Supplemental oxygen as needed. Short acting beta agonist as needed.    Essential hypertension Continue metoprolol 25 mg p.o. twice daily. Monitor blood pressure and heart rate.    Hyperlipidemia Not on medical therapy. Contraindicated at the moment.    Hypothyroidism Continue levothyroxine 88 mcg p.o. daily.    Stage 3 chronic kidney disease (HCC) Monitor renal function and electrolytes.    Prediabetes Currently on clear liquids. N.p.o. at midnight. CBG monitoring before meals and Bedtime once cleared for oral intake.    DVT prophylaxis: SCDs. Code Status:   Full code. Family Communication:   Disposition Plan:   Patient is from:  Home.  Anticipated DC to:  Home.  Anticipated DC date:  12/10/2021 or 12/11/2021.  Anticipated DC barriers: Clinical status.  Consults called:  Gastroenterology and general surgery.. Admission status:  Telemetry/observation.    Severity of Illness:High  severity after presenting with abdominal pain with multiple episodes of nausea and emesis in the setting of choledocholithiasis.  Reubin Milan MD [facility]  How to contact the [facility] Attending or Consulting provider Do[place] or covering provider during after hours [place], for this patient?   Check the care team in [facility] and look for a) attending/consulting [facility] provider listed and b) the [facility] team listed Log into [email or web address] and use [facility]'s universal password to access. If you do not have the password, please contact the hospital operator. Locate the [facility] provider you are looking for under [facility] and page to a number that you can be directly reached. If you still have difficulty reaching the provider, please page the [facility] (Director on Call) for the Hospitalists listed on amion for assistance.  12/08/2021, 2:05 PM   This document was prepared using [employer] and may contain some unintended transcription errors.

## 2021-12-08 NOTE — H&P (View-Only) (Signed)
Consultation  Referring Provider:  Dr. Sabino Dick Primary Care Physician:  [place], Buren Kos, MD Primary Gastroenterologist:   Dr. Stan Head      Reason for Consultation:   AB pain, choledocholithiasis on CT with GB distension         HPI:   Alexis Shelton is a 80 y.o. female with history of hypertension, hyperlipidemia, rheumatoid arthritis, hypothyroidism, chronic kidney disease stage III, history of stroke in 2019, history of cholelithiasis presents for evaluation of abdominal pain and choledocholithiasis.  Patient was last seen 2021 by Dr. Leone Payor for which she states was similar discomfort but more left upper quadrant with nausea and vomiting.  Had unremarkable EGD at that time.  States over the past year she has had 3-4 episodes of similar epigastric pain, not as intense relieved with vomiting.  Nonbloody emesis with food that she ate earlier in symptoms even from previous day. Would have decreased appetite, decreased taste.  Feel full quickly.   Does state fatty foods like bacon will cause nausea but no vomiting or pain. Patient also recounts dark urine 2 weeks ago intermittently, but denies any yellowing of skin and eyes.  This most recent episode patient states she had worse pain that she has had previously. Started with epigastric tightness, no radiation to her back.  Had 3 episodes of vomiting which did not make her abdominal pain feel better.  Patient denies fever, did have trembling last night and this morning, did have some diaphoresis.  The patient presented to the ER. She did have slight elevation of troponin, does have some shortness of breath with lying down flat at night, but denies chest pain or shortness of breath with exertion.  Maternal grandmother had gallbladder disease negative for family history of GI malignancy. Patient denies history of alcohol or drug use. Patient is not on a blood thinner. Normally has 1 bowel movement daily, will  have occasional spells of diarrhea, denies melena or hematochezia.  Normal Cologuard 2018.  ED course:  White blood cell count 8.7, no leukocytosis.  No anemia hemoglobin 13.8, platelets 249. BUN 26, creatinine 1.32, GFR 40 AST 678, ALT 353, alkaline phosphatase 155, total bilirubin 1.6 Lipase 103 Troponin 12-->32.  Previous GI work up: Last office visit 02/15/2020 Dr. Leone Payor for reflux, esophageal dysphagia, episodic diarrhea. EGD 03/07/2020 Dr. Leone Payor for LUQ pain - Benign-appearing esophageal stenosis. Dilated. - Small hiatal hernia. - A single gastric polyp. Biopsied. - Gastritis. Biopsied Remote colonoscopy Negative Cologuard 2018  Past Medical History:  Diagnosis Date   Arthritis    DDD (degenerative disc disease), lumbar 11/24/2018   Dizziness    Gallstones    Hearing loss    History of stroke involving cerebellum 10/13/2018   Hypercholesteremia    Hypertension    Hypothyroidism    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   Migraines    Mild intermittent asthma without complication 02/11/2019   Primary osteoarthritis of both feet 11/24/2018   Rheumatoid arthritis (HCC) 07/04/2018   Stage 3 chronic kidney disease (HCC) 02/11/2019   Vitamin D deficiency 02/11/2019    Past Surgical History:  Procedure Laterality Date   ABDOMINAL HYSTERECTOMY     BREAST REDUCTION SURGERY Bilateral    CATARACT EXTRACTION, BILATERAL     INCISION AND DRAINAGE BREAST ABSCESS Left    REDUCTION MAMMAPLASTY     REPLACEMENT TOTAL KNEE Bilateral    TONSILLECTOMY AND ADENOIDECTOMY      Family History  Problem Relation  Age of Onset   Stroke Mother    Hyperlipidemia Mother    Arthritis Mother    Heart disease Father    Arthritis Sister    Bipolar disorder Son    Healthy Son    Gallbladder disease Maternal Grandmother        radiation   GER disease Maternal Grandmother    Heart disease Paternal Uncle        x 7   Colon cancer Neg Hx    Esophageal cancer Neg Hx    Rectal cancer  Neg Hx    Stomach cancer Neg Hx    Cancer Neg Hx      Social History   Tobacco Use   Smoking status: Former    Packs/day: 0.10    Years: 3.00    Pack years: 0.30    Types: Cigarettes    Quit date: 1970    Years since quitting: 52.9   Smokeless tobacco: Never  Vaping Use   Vaping Use: Never used  Substance Use Topics   Alcohol use: Not Currently   Drug use: Never    Prior to Admission medications   Medication Sig Start Date End Date Taking? Authorizing Provider  albuterol (VENTOLIN HFA) 108 (90 Base) MCG/ACT inhaler Inhale 2 puffs into the lungs every 6 (six) hours as needed for wheezing or shortness of breath. Patient taking differently: Inhale 2 puffs into the lungs as needed for wheezing or shortness of breath. 10/22/19   Hilts, Michael, MD  allopurinol (ZYLOPRIM) 100 MG tablet TAKE 1 TABLET BY MOUTH EVERY DAY 10/03/21   [place], Blondell Reveal, MD  aspirin 81 MG EC tablet Take 81 mg by mouth daily. Swallow whole.    [provider]  betamethasone valerate ointment (VALISONE) 0.1 % Place in a thin lay to affect area of the vulva twice daily for 2 weeks and then apply in a thin layer to the vulva twice a week at bedtime for maintenance. 05/29/21   Nunzio Cobbs, MD  budesonide-formoterol [facility] [place]) 160-4.5 MCG/ACT inhaler Inhale 2 puffs into the lungs as needed. 12/02/20   Hilts, Legrand Como, MD  ciclopirox (PENLAC) 8 % solution Apply topically at bedtime. Apply over nail and surrounding skin. Apply daily over previous coat. After seven (7) days, may remove with alcohol and continue cycle. 04/25/21   Trula Slade, DPM  Cyanocobalamin ([employer] VITAMIN B-12) 5000 MCG SUBL Place under the tongue daily.    [provider]  levothyroxine (SYNTHROID) 88 MCG tablet TAKE 1 TABLET DAILY BEFORE BREAKFAST 12/04/21   Early, Coralee Pesa, NP  metoprolol tartrate (LOPRESSOR) 25 MG tablet TAKE 1 TABLET BY MOUTH TWICE A DAY 02/15/21   Hilts, Legrand Como, MD  montelukast (SINGULAIR) 10 MG  tablet TAKE 1 TABLET BY MOUTH EVERYDAY AT BEDTIME 10/03/21   [place], Blondell Reveal, MD  Multiple Vitamin (MULTIVITAMIN) tablet Take 1 tablet by mouth daily.    [provider]  omeprazole (PRILOSEC) 40 MG capsule TAKE 1 CAPSULE (40 MG TOTAL) BY MOUTH DAILY 30 MINS BEFORE BREAKFAST 06/29/21   Gatha Mayer, MD  traMADol (ULTRAM) 50 MG tablet Take 1 tablet (50 mg total) by mouth every 6 (six) hours as needed. 10/24/21   de [place], Raymond J, MD    Current Facility-Administered Medications  Medication Dose Route Frequency Provider Last Rate Last Admin   0.9 %  sodium chloride infusion   Intravenous Continuous Elnora Morrison, MD 125 mL/hr at 12/08/21 0909 New Bag at 12/08/21 [phone]  morphine 4 MG/ML injection 4 mg  4 mg Intravenous Once Delora Fuel, MD        Allergies as of 12/08/2021 - Review Complete 12/08/2021  Allergen Reaction Noted   Other  10/13/2018    Review of Systems:    Constitutional: No weight loss, fever, chills, weakness or fatigue HEENT: Eyes: No change in vision               Ears, Nose, Throat:  No change in hearing or congestion Skin: No rash or itching Cardiovascular: No chest pain, chest pressure or palpitations   Respiratory: No SOB or cough Gastrointestinal: See HPI and otherwise negative Genitourinary: No dysuria or change in urinary frequency Neurological: No headache, dizziness or syncope Musculoskeletal: No new muscle or joint pain Hematologic: No bleeding or bruising Psychiatric: No history of depression or anxiety     Physical Exam:  Vital signs in last 24 hours: Temp:  [98.6 F (37 C)-99.5 F (37.5 C)] 99.5 F (37.5 C) (12/16 1230) Pulse Rate:  [72-83] 72 (12/16 1211) Resp:  [17-24] 17 (12/16 1211) BP: (131-166)/(56-114) 153/64 (12/16 1211) SpO2:  [84 %-96 %] 96 % (12/16 1211) FiO2 (%):  [28 %] 28 % (12/16 0608) Weight:  [79.4 kg] 79.4 kg (12/16 0606)    General:   Pleasant, well developed female in no acute distress Head:  Normocephalic  and atraumatic. Eyes: sclerae anicteric,conjunctive pink, left anterior eye lid with erythema, slight tenderness, no warmth.  Heart:  regular rate and rhythm, no murmurs or gallops Pulm: Clear anteriorly; no wheezing Abdomen:  Soft, Obese AB, skin exam normal, Normal bowel sounds. mild tenderness in the epigastrium. Without guarding and Without rebound, without hepatomegaly.  Extremities:  Without edema. Msk:  Symmetrical without gross deformities. Peripheral pulses intact.  Neurologic:  Alert and  oriented x4;  grossly normal neurologically.  Skin:   Dry and intact without significant lesions or rashes. Psychiatric: Demonstrates good judgement and reason without abnormal affect or behaviors.  LAB RESULTS: Recent Labs    12/08/21 0627 12/08/21 1226  WBC 8.7  --   HGB 13.8 12.6  HCT 44.7 37.0  PLT 249  --    BMET Recent Labs    12/08/21 0627 12/08/21 1226  NA 141 140  K 4.3 4.7  CL 104  --   CO2 26  --   GLUCOSE 151*  --   BUN 26*  --   CREATININE 1.32*  --   CALCIUM 9.4  --    LFT Recent Labs    12/08/21 0627  PROT 7.2  ALBUMIN 4.0  AST 678*  ALT 353*  ALKPHOS 155*  BILITOT 1.6*   PT/INR No results for input(s): LABPROT, INR in the last 72 hours.  STUDIES: CT ABDOMEN PELVIS W CONTRAST  Result Date: 12/08/2021 CLINICAL DATA:  Acute and nonlocalized abdominal pain EXAM: CT ABDOMEN AND PELVIS WITH CONTRAST TECHNIQUE: Multidetector CT imaging of the abdomen and pelvis was performed using the standard protocol following bolus administration of intravenous contrast. CONTRAST:  54mL OMNIPAQUE IOHEXOL 300 MG/ML  SOLN COMPARISON:  03/25/2020 FINDINGS: Lower chest:  No contributory findings. Hepatobiliary: No focal liver abnormality.Gallstones. Physiologically distended gallbladder and mildly dilated common bile duct (9 mm) with avid mucosal based enhancement of both structures. There is a 1 cm rounded structure in the distal CBD that matches the density of gallstones.  Pancreas: No acute finding or ductal dilatation. Spleen: Unremarkable. Adrenals/Urinary Tract: Negative adrenals. No hydronephrosis or stone. Symmetric atrophy and cortical lobulation. Unremarkable  bladder. Stomach/Bowel: No obstruction. No visible bowel inflammation. Distal colonic diverticula which are numerous. Vascular/Lymphatic: No acute vascular abnormality. Scattered atheromatous calcifications. No mass or adenopathy. Reproductive:Hysterectomy. Other: No ascites or pneumoperitoneum.  Small midline fatty hernia. Musculoskeletal: No acute abnormalities. Generalized thoracic and lumbar disc degeneration. Remote T12 compression fracture. IMPRESSION: Choledocholithiasis with distended gallbladder and common bile duct. Electronically Signed   By: Jorje Guild M.D.   On: 12/08/2021 07:58      Impression     Choledocholithiasis, no leukocytosis, patient hemodynamically stable. WBC 8.7 HGB 12.6 Platelets 249 AST 678 ALT 353  Alkphos 155 TBili 1.6 INR an lactic acid not gotten this admission CT images with clear choledocholithiasis and distended gallbladder and common bile duct No elevation of white blood cell count at this time History of normal EGD 2021  Elevated troponin, mildly elevated Echo 09/2018 EF 60-65%. Grade 1 diastolic dysfunction Personally history of CVA 2019 Cardiology has been consulted.  Elevated liver function Likely secondary to choledocholithiasis  Stage III chronic kidney disease  History of CVA 2019 Patient states this was after hysterectomy, had decreased hearing afterwards.  Hypertension  Hyperlipidemia    Plan    - get INR - Will start on IV Cipro 400 mg once daily -Will monitor CBC and CMET, repeat in AM -Continue supportive care -Continue pain control. -Continue IV hydration, can do clear liquids today, n.p.o. at midnight. - With history of CVA, flattening of T waves on EKG and slight troponin elevation, cardiology consult has been requested.   Appreciate their input. -If patient is started on heparin please notify GI as this needs to be stopped  6 hours prior to planned ERCP please -Plan for ERCP tomorrow -I thoroughly discussed procedure with the patient to include nature, alternatives, benefits, and risks (including but not limited to post ERCP pancreatitis, bleeding, infection, perforation, anesthesia/cardiac pulmonary complications).  Patient verbalized understanding and gave verbal consent to proceed with ERCP.  We will plan for general surgery consult as patient will likely need a cholecystectomy at some point.   Thank you for your kind consultation, we will continue to follow.  Vladimir Crofts  12/08/2021, 1:27 PM    ________________________________________________________________________  Velora Heckler GI MD note:  I personally examined the patient, reviewed the data and agree with the assessment and plan described above. She has been having intermittent episodes of epig pain and vomiting for quite a while and clearly has stones in her GB and a stone in her CBD with elevated liver tests.  I am planning ERCP tomorrow morning. We've asked for cardiology clearance given her slightly elevated troponin.  Will start Iv abx for now   Owens Loffler, MD [facility] Gastroenterology Pager [phone]

## 2021-12-08 NOTE — ED Triage Notes (Signed)
Pt reports "pressure" upper abdominal pain "feels like my food is stopping somewhere and it can't get down." Has had vomiting, last BM last night "normal". Also having lower back pain that has started since she arrived. Pale. Saturations 87% on RA in triage.

## 2021-12-08 NOTE — ED Notes (Signed)
Dr.Zavitz notified of elevated Troponin

## 2021-12-08 NOTE — ED Notes (Signed)
Pt copmplains of srtritic joint pain but otherwise feels fine. Pt was resting when this RN entered the room

## 2021-12-09 ENCOUNTER — Observation Stay (HOSPITAL_COMMUNITY): Payer: Medicare Other | Admitting: Certified Registered"

## 2021-12-09 ENCOUNTER — Encounter (HOSPITAL_COMMUNITY)
Admission: EM | Disposition: A | Discharge status: Home / Self Care | Payer: Self-pay | Source: Home / Self Care | Attending: Internal Medicine

## 2021-12-09 ENCOUNTER — Observation Stay (HOSPITAL_COMMUNITY): Payer: Medicare Other

## 2021-12-09 ENCOUNTER — Encounter (HOSPITAL_COMMUNITY): Payer: Self-pay | Admitting: Internal Medicine

## 2021-12-09 DIAGNOSIS — Z7982 Long term (current) use of aspirin: Secondary | ICD-10-CM | POA: Diagnosis not present

## 2021-12-09 DIAGNOSIS — R778 Other specified abnormalities of plasma proteins: Secondary | ICD-10-CM

## 2021-12-09 DIAGNOSIS — I248 Other forms of acute ischemic heart disease: Secondary | ICD-10-CM | POA: Diagnosis not present

## 2021-12-09 DIAGNOSIS — Z20822 Contact with and (suspected) exposure to covid-19: Secondary | ICD-10-CM | POA: Diagnosis not present

## 2021-12-09 DIAGNOSIS — K449 Diaphragmatic hernia without obstruction or gangrene: Secondary | ICD-10-CM | POA: Diagnosis present

## 2021-12-09 DIAGNOSIS — K8080 Other cholelithiasis without obstruction: Secondary | ICD-10-CM | POA: Diagnosis not present

## 2021-12-09 DIAGNOSIS — M109 Gout, unspecified: Secondary | ICD-10-CM | POA: Diagnosis present

## 2021-12-09 DIAGNOSIS — R7402 Elevation of levels of lactic acid dehydrogenase (LDH): Secondary | ICD-10-CM | POA: Diagnosis present

## 2021-12-09 DIAGNOSIS — K8010 Calculus of gallbladder with chronic cholecystitis without obstruction: Secondary | ICD-10-CM | POA: Diagnosis not present

## 2021-12-09 DIAGNOSIS — M1000 Idiopathic gout, unspecified site: Secondary | ICD-10-CM | POA: Diagnosis not present

## 2021-12-09 DIAGNOSIS — J4520 Mild intermittent asthma, uncomplicated: Secondary | ICD-10-CM | POA: Diagnosis not present

## 2021-12-09 DIAGNOSIS — K8050 Calculus of bile duct without cholangitis or cholecystitis without obstruction: Secondary | ICD-10-CM | POA: Diagnosis not present

## 2021-12-09 DIAGNOSIS — K8030 Calculus of bile duct with cholangitis, unspecified, without obstruction: Secondary | ICD-10-CM | POA: Diagnosis not present

## 2021-12-09 DIAGNOSIS — H9190 Unspecified hearing loss, unspecified ear: Secondary | ICD-10-CM | POA: Diagnosis present

## 2021-12-09 DIAGNOSIS — I10 Essential (primary) hypertension: Secondary | ICD-10-CM | POA: Diagnosis not present

## 2021-12-09 DIAGNOSIS — Z79899 Other long term (current) drug therapy: Secondary | ICD-10-CM | POA: Diagnosis not present

## 2021-12-09 DIAGNOSIS — K8070 Calculus of gallbladder and bile duct without cholecystitis without obstruction: Secondary | ICD-10-CM | POA: Diagnosis not present

## 2021-12-09 DIAGNOSIS — K219 Gastro-esophageal reflux disease without esophagitis: Secondary | ICD-10-CM | POA: Diagnosis not present

## 2021-12-09 DIAGNOSIS — K8309 Other cholangitis: Secondary | ICD-10-CM | POA: Diagnosis present

## 2021-12-09 DIAGNOSIS — M069 Rheumatoid arthritis, unspecified: Secondary | ICD-10-CM | POA: Diagnosis not present

## 2021-12-09 DIAGNOSIS — E669 Obesity, unspecified: Secondary | ICD-10-CM | POA: Diagnosis not present

## 2021-12-09 DIAGNOSIS — E7800 Pure hypercholesterolemia, unspecified: Secondary | ICD-10-CM | POA: Diagnosis present

## 2021-12-09 DIAGNOSIS — Z90710 Acquired absence of both cervix and uterus: Secondary | ICD-10-CM | POA: Diagnosis not present

## 2021-12-09 DIAGNOSIS — M19071 Primary osteoarthritis, right ankle and foot: Secondary | ICD-10-CM | POA: Diagnosis present

## 2021-12-09 DIAGNOSIS — E039 Hypothyroidism, unspecified: Secondary | ICD-10-CM | POA: Diagnosis not present

## 2021-12-09 DIAGNOSIS — Z823 Family history of stroke: Secondary | ICD-10-CM | POA: Diagnosis not present

## 2021-12-09 DIAGNOSIS — J9811 Atelectasis: Secondary | ICD-10-CM | POA: Diagnosis not present

## 2021-12-09 DIAGNOSIS — R1314 Dysphagia, pharyngoesophageal phase: Secondary | ICD-10-CM | POA: Diagnosis not present

## 2021-12-09 DIAGNOSIS — N1832 Chronic kidney disease, stage 3b: Secondary | ICD-10-CM | POA: Diagnosis not present

## 2021-12-09 DIAGNOSIS — R7303 Prediabetes: Secondary | ICD-10-CM | POA: Diagnosis present

## 2021-12-09 DIAGNOSIS — R0902 Hypoxemia: Secondary | ICD-10-CM | POA: Diagnosis not present

## 2021-12-09 DIAGNOSIS — J302 Other seasonal allergic rhinitis: Secondary | ICD-10-CM | POA: Diagnosis present

## 2021-12-09 DIAGNOSIS — I129 Hypertensive chronic kidney disease with stage 1 through stage 4 chronic kidney disease, or unspecified chronic kidney disease: Secondary | ICD-10-CM | POA: Diagnosis not present

## 2021-12-09 HISTORY — PX: REMOVAL OF STONES: SHX5545

## 2021-12-09 HISTORY — PX: ENDOSCOPIC RETROGRADE CHOLANGIOPANCREATOGRAPHY (ERCP) WITH PROPOFOL: SHX5810

## 2021-12-09 HISTORY — PX: SPHINCTEROTOMY: SHX5544

## 2021-12-09 LAB — CBC
HCT: 37.9 % (ref 36.0–46.0)
Hemoglobin: 11.8 g/dL — ABNORMAL LOW (ref 12.0–15.0)
MCH: 29.3 pg (ref 26.0–34.0)
MCHC: 31.1 g/dL (ref 30.0–36.0)
MCV: 94 fL (ref 80.0–100.0)
Platelets: 188 10*3/uL (ref 150–400)
RBC: 4.03 MIL/uL (ref 3.87–5.11)
RDW: 17.1 % — ABNORMAL HIGH (ref 11.5–15.5)
WBC: 8.5 10*3/uL (ref 4.0–10.5)
nRBC: 0 % (ref 0.0–0.2)

## 2021-12-09 LAB — ECHOCARDIOGRAM COMPLETE
Area-P 1/2: 2.53 cm2
Height: 66 in
S' Lateral: 2.6 cm
Weight: 2800 oz

## 2021-12-09 LAB — COMPREHENSIVE METABOLIC PANEL
ALT: 390 U/L — ABNORMAL HIGH (ref 0–44)
AST: 332 U/L — ABNORMAL HIGH (ref 15–41)
Albumin: 3.1 g/dL — ABNORMAL LOW (ref 3.5–5.0)
Alkaline Phosphatase: 142 U/L — ABNORMAL HIGH (ref 38–126)
Anion gap: 6 (ref 5–15)
BUN: 21 mg/dL (ref 8–23)
CO2: 23 mmol/L (ref 22–32)
Calcium: 8 mg/dL — ABNORMAL LOW (ref 8.9–10.3)
Chloride: 106 mmol/L (ref 98–111)
Creatinine, Ser: 1.44 mg/dL — ABNORMAL HIGH (ref 0.44–1.00)
GFR, Estimated: 36 mL/min — ABNORMAL LOW (ref >60)
Glucose, Bld: 109 mg/dL — ABNORMAL HIGH (ref 70–99)
Potassium: 4.4 mmol/L (ref 3.5–5.1)
Sodium: 135 mmol/L (ref 135–145)
Total Bilirubin: 5.7 mg/dL — ABNORMAL HIGH (ref 0.3–1.2)
Total Protein: 6.2 g/dL — ABNORMAL LOW (ref 6.5–8.1)

## 2021-12-09 LAB — GLUCOSE, CAPILLARY
Glucose-Capillary: 87 mg/dL (ref 70–99)
Glucose-Capillary: 185 mg/dL — ABNORMAL HIGH (ref 70–99)
Glucose-Capillary: 168 mg/dL — ABNORMAL HIGH (ref 70–99)

## 2021-12-09 LAB — LIPASE, BLOOD: Lipase: 78 U/L — ABNORMAL HIGH (ref 11–51)

## 2021-12-09 SURGERY — ENDOSCOPIC RETROGRADE CHOLANGIOPANCREATOGRAPHY (ERCP) WITH PROPOFOL
Anesthesia: General

## 2021-12-09 MED ORDER — AMISULPRIDE (ANTIEMETIC) 5 MG/2ML IV SOLN: 10.0000 mg | Freq: Once | INTRAVENOUS | Status: DC | PRN

## 2021-12-09 MED ORDER — FENTANYL CITRATE (PF) 100 MCG/2ML IJ SOLN
INTRAMUSCULAR | Status: AC
  Filled 2021-12-09: qty 2

## 2021-12-09 MED ORDER — MIDAZOLAM HCL 2 MG/2ML IJ SOLN
INTRAMUSCULAR | Status: AC
  Filled 2021-12-09: qty 2

## 2021-12-09 MED ORDER — INDOMETHACIN 50 MG RE SUPP: 100.0000 mg | Freq: Once | RECTAL | Status: DC

## 2021-12-09 MED ORDER — SODIUM CHLORIDE 0.9 % IV SOLN: INTRAVENOUS | Status: DC

## 2021-12-09 MED ORDER — PROPOFOL 10 MG/ML IV BOLUS
INTRAVENOUS | Status: DC | PRN
  Administered 2021-12-09: 130 mg via INTRAVENOUS

## 2021-12-09 MED ORDER — PROMETHAZINE HCL 25 MG/ML IJ SOLN: 6.2500 mg | INTRAMUSCULAR | Status: DC | PRN

## 2021-12-09 MED ORDER — PROPOFOL 10 MG/ML IV BOLUS
INTRAVENOUS | Status: AC
  Filled 2021-12-09: qty 20

## 2021-12-09 MED ORDER — ONDANSETRON HCL 4 MG/2ML IJ SOLN
INTRAMUSCULAR | Status: AC
  Filled 2021-12-09: qty 2

## 2021-12-09 MED ORDER — ONDANSETRON HCL 4 MG/2ML IJ SOLN
INTRAMUSCULAR | Status: DC | PRN
  Administered 2021-12-09: 4 mg via INTRAVENOUS

## 2021-12-09 MED ORDER — CIPROFLOXACIN IN D5W 400 MG/200ML IV SOLN
INTRAVENOUS | Status: AC
  Filled 2021-12-09: qty 200

## 2021-12-09 MED ORDER — ALBUTEROL SULFATE HFA 108 (90 BASE) MCG/ACT IN AERS
INHALATION_SPRAY | RESPIRATORY_TRACT | Status: DC | PRN
  Administered 2021-12-09 (×2): 2 via RESPIRATORY_TRACT

## 2021-12-09 MED ORDER — INDOMETHACIN 50 MG RE SUPP
RECTAL | Status: DC | PRN
  Administered 2021-12-09: 100 mg via RECTAL

## 2021-12-09 MED ORDER — PHENYLEPHRINE HCL-NACL 20-0.9 MG/250ML-% IV SOLN
INTRAVENOUS | Status: DC | PRN
  Administered 2021-12-09: 20 ug/min via INTRAVENOUS

## 2021-12-09 MED ORDER — OXYCODONE HCL 5 MG/5ML PO SOLN: 5.0000 mg | Freq: Once | ORAL | Status: DC | PRN

## 2021-12-09 MED ORDER — DEXAMETHASONE SODIUM PHOSPHATE 10 MG/ML IJ SOLN
INTRAMUSCULAR | Status: AC
  Filled 2021-12-09: qty 1

## 2021-12-09 MED ORDER — LIDOCAINE 2% (20 MG/ML) 5 ML SYRINGE
INTRAMUSCULAR | Status: AC
  Filled 2021-12-09: qty 5

## 2021-12-09 MED ORDER — FENTANYL CITRATE (PF) 100 MCG/2ML IJ SOLN
INTRAMUSCULAR | Status: DC | PRN
Start: 2021-12-09 — End: 2021-12-09
  Administered 2021-12-09 (×2): 25 ug via INTRAVENOUS
  Administered 2021-12-09: 50 ug via INTRAVENOUS

## 2021-12-09 MED ORDER — SUGAMMADEX SODIUM 500 MG/5ML IV SOLN
INTRAVENOUS | Status: AC
  Filled 2021-12-09: qty 5

## 2021-12-09 MED ORDER — OXYCODONE HCL 5 MG PO TABS: 5.0000 mg | ORAL_TABLET | Freq: Once | ORAL | Status: DC | PRN

## 2021-12-09 MED ORDER — HYDROCORTISONE 1 % EX CREA
1.0000 "application " | TOPICAL_CREAM | Freq: Three times a day (TID) | CUTANEOUS | Status: DC | PRN
  Administered 2021-12-09: 1 via TOPICAL
  Filled 2021-12-09: qty 28

## 2021-12-09 MED ORDER — SODIUM CHLORIDE 0.9 % IV SOLN
INTRAVENOUS | Status: DC | PRN
  Administered 2021-12-09: 40 mL

## 2021-12-09 MED ORDER — GLUCAGON HCL RDNA (DIAGNOSTIC) 1 MG IJ SOLR
INTRAMUSCULAR | Status: AC
  Filled 2021-12-09: qty 1

## 2021-12-09 MED ORDER — DEXAMETHASONE SODIUM PHOSPHATE 10 MG/ML IJ SOLN
INTRAMUSCULAR | Status: DC | PRN
  Administered 2021-12-09: 5 mg via INTRAVENOUS

## 2021-12-09 MED ORDER — LIDOCAINE 2% (20 MG/ML) 5 ML SYRINGE
INTRAMUSCULAR | Status: DC | PRN
  Administered 2021-12-09: 80 mg via INTRAVENOUS

## 2021-12-09 MED ORDER — SUCCINYLCHOLINE CHLORIDE 200 MG/10ML IV SOSY
PREFILLED_SYRINGE | INTRAVENOUS | Status: DC | PRN
  Administered 2021-12-09: 120 mg via INTRAVENOUS

## 2021-12-09 MED ORDER — HYDROMORPHONE HCL 1 MG/ML IJ SOLN: 0.2500 mg | INTRAMUSCULAR | Status: DC | PRN

## 2021-12-09 MED ORDER — PHENYLEPHRINE HCL (PRESSORS) 10 MG/ML IV SOLN
INTRAVENOUS | Status: AC
  Filled 2021-12-09: qty 1

## 2021-12-09 MED ORDER — SUCCINYLCHOLINE CHLORIDE 200 MG/10ML IV SOSY
PREFILLED_SYRINGE | INTRAVENOUS | Status: AC
  Filled 2021-12-09: qty 10

## 2021-12-09 MED ORDER — INDOMETHACIN 50 MG RE SUPP
RECTAL | Status: AC
  Filled 2021-12-09: qty 2

## 2021-12-09 MED ORDER — LACTATED RINGERS IV SOLN
INTRAVENOUS | Status: AC | PRN
  Administered 2021-12-09: 140 mL/h via INTRAVENOUS

## 2021-12-09 NOTE — Anesthesia Procedure Notes (Signed)
Date/Time: 12/09/2021 9:09 AM Performed by: Minerva Ends, CRNA Oxygen Delivery Method: Simple face mask Placement Confirmation: positive ETCO2 and breath sounds checked- equal and bilateral Dental Injury: Teeth and Oropharynx as per pre-operative assessment

## 2021-12-09 NOTE — Progress Notes (Addendum)
Patient ID: Alexis Shelton, female   DOB: 1941/02/04, 80 y.o.   MRN: [id number] For ercp today with Dr Christella Hartigan Discussed will see in am, npo after mn, if doing well consider taking for lap chole tomorrow to prevent further episodes.   Addendum: discussed with Dr Christella Hartigan, cards following prior to ercp and need for lap chole also. With purulence in duct and other issues likely will wait until Monday to do gallbladder to see how she does.

## 2021-12-09 NOTE — Anesthesia Postprocedure Evaluation (Signed)
Anesthesia Post Note  Patient: Alexis Shelton  Procedure(s) Performed: ENDOSCOPIC RETROGRADE CHOLANGIOPANCREATOGRAPHY (ERCP) WITH PROPOFOL SPHINCTEROTOMY REMOVAL OF STONES     Patient location during evaluation: PACU Anesthesia Type: General Level of consciousness: awake and alert Pain management: pain level controlled Vital Signs Assessment: post-procedure vital signs reviewed and stable Respiratory status: spontaneous breathing, nonlabored ventilation and respiratory function stable Cardiovascular status: blood pressure returned to baseline and stable Postop Assessment: no apparent nausea or vomiting Anesthetic complications: no   No notable events documented.  Last Vitals:  Vitals:   12/09/21 0945 12/09/21 1000  BP: (!) 147/59 (!) 155/57  Pulse: 60 (!) 57  Resp: 10 12  Temp:  36.4 C  SpO2: 97% (!) 84%    Last Pain:  Vitals:   12/09/21 1000  TempSrc: Oral  PainSc: 0-No pain                 Lowella Curb

## 2021-12-09 NOTE — Interval H&P Note (Signed)
History and Physical Interval Note:  12/09/2021 8:03 AM  [name]  has presented today for surgery, with the diagnosis of Choledocholithiasis.  The various methods of treatment have been discussed with the patient and family. After consideration of risks, benefits and other options for treatment, the patient has consented to  Procedure(s): ENDOSCOPIC RETROGRADE CHOLANGIOPANCREATOGRAPHY (ERCP) WITH PROPOFOL (N/A) as a surgical intervention.  The patient's history has been reviewed, patient examined, no change in status, stable for surgery.  I have reviewed the patient's chart and labs.  Questions were answered to the patient's satisfaction.     I spoke with [facility] cardiology Dr. Nicki Reaper this morning, he reviewed her chart thoroughly including EKG, Echo, labs, history and felt her to be at low risk to proceed with ERCP today. They are still planning on formal, written cardiac clearance as well.   Rachael Fee

## 2021-12-09 NOTE — Anesthesia Procedure Notes (Signed)
Procedure Name: Intubation Date/Time: 12/09/2021 8:17 AM Performed by: Minerva Ends, CRNA Pre-anesthesia Checklist: Patient identified, Emergency Drugs available, Suction available and Patient being monitored Patient Re-evaluated:Patient Re-evaluated prior to induction Oxygen Delivery Method: Circle System Utilized Preoxygenation: Pre-oxygenation with 100% oxygen Induction Type: IV induction, Rapid sequence and Cricoid Pressure applied Ventilation: Mask ventilation without difficulty Laryngoscope Size: Miller and 2 Grade View: Grade I Tube type: Oral Number of attempts: 1 Airway Equipment and Method: Stylet Placement Confirmation: ETT inserted through vocal cords under direct vision, positive ETCO2 and breath sounds checked- equal and bilateral Secured at: 21 cm Tube secured with: Tape Dental Injury: Teeth and Oropharynx as per pre-operative assessment  Comments: Smooth IV induction Miller- intubation AM CRNA atraumatic-  teeth and mouth as preop - bilat BS

## 2021-12-09 NOTE — Progress Notes (Signed)
PROGRESS NOTE    Alexis Shelton  PNT:[id number] DOB: 1941/04/18 DOA: 12/08/2021 PCP: de [place], Raymond J, MD     Brief Narrative:  Alexis Shelton is a 80 y.o. female with medical history significant of osteoarthritis, lumbar DDD, unspecified dizziness, gallstones, hearing loss, cerebellar CVA, hypertension, hyperlipidemia, hypothyroid [place], gout, migraine headaches, mild intermittent asthma, osteoarthritis, history of rheumatoid arthritis, stage III CKD, vitamin D deficiency who is coming to the emergency department due to abdominal pain that she has had on/off for about 2 years.  However, the frequency of this pain has increased over the last few months, more so in the last few weeks.  Yesterday afternoon, the patient stated that her pain got very intense.  She had 3 episodes of emesis after that.  This morning she was unable to tolerate the pain and called EMS.  CT abdomen/pelvis with contrast showed choledocholithiasis with distended gallbladder and CBD. GI, general surgery, cardiology were consulted.   New events last 24 hours / Subjective: Patient underwent ERCP this morning.  Patient is seen after lunch, states that she is feeling well, denies any abdominal pain, nausea or vomiting.  Denies any chest pain today.  Assessment & Plan:   Principal Problem:   Choledocholithiasis Active Problems:   Essential hypertension   Hyperlipidemia   Hypothyroidism   Stage 3 chronic kidney disease (HCC)   Mild intermittent asthma without complication   Prediabetes   Elevated liver function tests   Cholangitis   Ascending cholangitis with elevated LFTs  -Status post ERCP 12/17, biliary sphincterectomy and balloon sweeping -General surgery following for cholecystectomy this hospitalization -Continue Cipro -Trend LFTs  Essential hypertension -Continue metoprolol  Hypothyroidism -Continue Synthroid  CKD stage IIIb -Baseline Cr 1.3   Prediabetes -SSI   Demand ischemia with  elevated troponin -Troponin 32, 34 -Cardiology following -Echocardiogram pending  DVT prophylaxis:  SCDs Start: 12/08/21 1404  Code Status: Full code Family Communication: No family at bedside Disposition Plan:  Status is: Inpatient  Remains inpatient appropriate because: Remains on IV antibiotics and will need cholecystectomy during this hospital stay      Consultants:  GI General surgery Cardiology  Procedures:  ERCP 12/17  Antimicrobials:  Anti-infectives (From admission, onward)    Start     Dose/Rate Route Frequency Ordered Stop   12/08/21 1600  ciprofloxacin (CIPRO) IVPB 400 mg        400 mg 200 mL/hr over 60 Minutes Intravenous Every 12 hours 12/08/21 1459     12/08/21 1530  ciprofloxacin (CIPRO) IVPB 400 mg  Status:  Discontinued        400 mg 200 mL/hr over 60 Minutes Intravenous Every 24 hours 12/08/21 1431 12/08/21 1459        Objective: Vitals:   12/09/21 0930 12/09/21 0945 12/09/21 1000 12/09/21 1050  BP: (!) 150/60 (!) 147/59 (!) 155/57 138/69  Pulse: (!) 58 60 (!) 57 (!) 59  Resp: 10 10 12 18   Temp:   97.6 F (36.4 C) 98 F (36.7 C)  TempSrc:   Oral Oral  SpO2: 97% 97% (!) 84% 92%  Weight:      Height:        Intake/Output Summary (Last 24 hours) at 12/09/2021 1235 Last data filed at 12/09/2021 1203 Gross per 24 hour  Intake 3039.98 ml  Output 0 ml  Net 3039.98 ml   Filed Weights   12/08/21 0606  Weight: 79.4 kg    Examination:  General exam: Appears calm and comfortable  Respiratory  system: Clear to auscultation. Respiratory effort normal. No respiratory distress. No conversational dyspnea.  Cardiovascular system: S1 & S2 heard, RRR. No murmurs. No pedal edema. Gastrointestinal system: Abdomen is nondistended, soft and nontender. Normal bowel sounds heard. Central nervous system: Alert and oriented. No focal neurological deficits. Speech clear.  Extremities: Symmetric in appearance  Skin: No rashes, lesions or ulcers on  exposed skin  Psychiatry: Judgement and insight appear normal. Mood & affect appropriate.   Data Reviewed: I have personally reviewed following labs and imaging studies  CBC: Recent Labs  Lab 12/08/21 0627 12/08/21 1226 12/09/21 1132  WBC 8.7  --  8.5  NEUTROABS 7.6  --   --   HGB 13.8 12.6 11.8*  HCT 44.7 37.0 37.9  MCV 93.1  --  94.0  PLT 249  --  0000000   Basic Metabolic Panel: Recent Labs  Lab 12/08/21 0627 12/08/21 1226  NA 141 140  K 4.3 4.7  CL 104  --   CO2 26  --   GLUCOSE 151*  --   BUN 26*  --   CREATININE 1.32*  --   CALCIUM 9.4  --    GFR: Estimated Creatinine Clearance: 34.9 mL/min (A) (by C-G formula based on SCr of 1.32 mg/dL (H)). Liver Function Tests: Recent Labs  Lab 12/08/21 0627  AST 678*  ALT 353*  ALKPHOS 155*  BILITOT 1.6*  PROT 7.2  ALBUMIN 4.0   Recent Labs  Lab 12/08/21 0627  LIPASE 103*   No results for input(s): AMMONIA in the last 168 hours. Coagulation Profile: Recent Labs  Lab 12/08/21 1443  INR 1.1   Cardiac Enzymes: No results for input(s): CKTOTAL, CKMB, CKMBINDEX, TROPONINI in the last 168 hours. BNP (last 3 results) No results for input(s): PROBNP in the last 8760 hours. HbA1C: No results for input(s): HGBA1C in the last 72 hours. CBG: Recent Labs  Lab 12/09/21 1038  GLUCAP 87   Lipid Profile: No results for input(s): CHOL, HDL, LDLCALC, TRIG, CHOLHDL, LDLDIRECT in the last 72 hours. Thyroid Function Tests: No results for input(s): TSH, T4TOTAL, FREET4, T3FREE, THYROIDAB in the last 72 hours. Anemia Panel: No results for input(s): VITAMINB12, FOLATE, FERRITIN, TIBC, IRON, RETICCTPCT in the last 72 hours. Sepsis Labs: No results for input(s): PROCALCITON, LATICACIDVEN in the last 168 hours.  Recent Results (from the past 240 hour(s))  Resp Panel by RT-PCR (Flu A&B, Covid) Nasopharyngeal Swab     Status: None   Collection Time: 12/08/21  8:30 AM   Specimen: Nasopharyngeal Swab; Nasopharyngeal(NP) swabs in  vial transport medium  Result Value Ref Range Status   SARS Coronavirus 2 by RT PCR NEGATIVE NEGATIVE Final    Comment: (NOTE) SARS-CoV-2 target nucleic acids are NOT DETECTED.  The SARS-CoV-2 RNA is generally detectable in upper respiratory specimens during the acute phase of infection. The lowest concentration of SARS-CoV-2 viral copies this assay can detect is 138 copies/mL. A negative result does not preclude SARS-Cov-2 infection and should not be used as the sole basis for treatment or other patient management decisions. A negative result may occur with  improper specimen collection/handling, submission of specimen other than nasopharyngeal swab, presence of viral mutation(s) within the areas targeted by this assay, and inadequate number of viral copies(<138 copies/mL). A negative result must be combined with clinical observations, patient history, and epidemiological information. The expected result is Negative.  Fact Sheet for Patients:  EntrepreneurPulse.com.au  Fact Sheet for Healthcare Providers:  IncredibleEmployment.be  This test is no t  yet approved or cleared by the [place] and  has been authorized for detection and/or diagnosis of SARS-CoV-2 by FDA under an Emergency Use Authorization (EUA). This EUA will remain  in effect (meaning this test can be used) for the duration of the COVID-19 declaration under Section 564(b)(1) of the Act, 21 U.S.C.section 360bbb-3(b)(1), unless the authorization is terminated  or revoked sooner.       Influenza A by PCR NEGATIVE NEGATIVE Final   Influenza B by PCR NEGATIVE NEGATIVE Final    Comment: (NOTE) The Xpert Xpress SARS-CoV-2/FLU/RSV plus assay is intended as an aid in the diagnosis of influenza from Nasopharyngeal swab specimens and should not be used as a sole basis for treatment. Nasal washings and aspirates are unacceptable for Xpert Xpress SARS-CoV-2/FLU/RSV testing.  Fact  Sheet for Patients: EntrepreneurPulse.com.au  Fact Sheet for Healthcare Providers: IncredibleEmployment.be  This test is not yet approved or cleared by the [place] FDA and has been authorized for detection and/or diagnosis of SARS-CoV-2 by FDA under an Emergency Use Authorization (EUA). This EUA will remain in effect (meaning this test can be used) for the duration of the COVID-19 declaration under Section 564(b)(1) of the Act, 21 U.S.C. section 360bbb-3(b)(1), unless the authorization is terminated or revoked.  Performed at [facility], [address] [place], [place] [address]       Radiology Studies: CT ABDOMEN PELVIS W CONTRAST  Result Date: 12/08/2021 CLINICAL DATA:  Acute and nonlocalized abdominal pain EXAM: CT ABDOMEN AND PELVIS WITH CONTRAST TECHNIQUE: Multidetector CT imaging of the abdomen and pelvis was performed using the standard protocol following bolus administration of intravenous contrast. CONTRAST:  53mL OMNIPAQUE IOHEXOL 300 MG/ML  SOLN COMPARISON:  03/25/2020 FINDINGS: Lower chest:  No contributory findings. Hepatobiliary: No focal liver abnormality.Gallstones. Physiologically distended gallbladder and mildly dilated common bile duct (9 mm) with avid mucosal based enhancement of both structures. There is a 1 cm rounded structure in the distal CBD that matches the density of gallstones. Pancreas: No acute finding or ductal dilatation. Spleen: Unremarkable. Adrenals/Urinary Tract: Negative adrenals. No hydronephrosis or stone. Symmetric atrophy and cortical lobulation. Unremarkable bladder. Stomach/Bowel: No obstruction. No visible bowel inflammation. Distal colonic diverticula which are numerous. Vascular/Lymphatic: No acute vascular abnormality. Scattered atheromatous calcifications. No mass or adenopathy. Reproductive:Hysterectomy. Other: No ascites or pneumoperitoneum.  Small midline fatty hernia.  Musculoskeletal: No acute abnormalities. Generalized thoracic and lumbar disc degeneration. Remote T12 compression fracture. IMPRESSION: Choledocholithiasis with distended gallbladder and common bile duct. Electronically Signed   By: Jorje Guild M.D.   On: 12/08/2021 07:58   DG CHEST PORT 1 VIEW  Result Date: 12/08/2021 CLINICAL DATA:  Hypoxia, hypertension, stage 3 chronic kidney disease EXAM: PORTABLE CHEST 1 VIEW COMPARISON:  None. FINDINGS: Lungs are clear. No pneumothorax or pleural effusion. Cardiac size is within normal limits. Central pulmonary arteries appear enlarged suggesting changes of pulmonary arterial hypertension. No acute bone abnormality. IMPRESSION: No radiographic evidence of acute cardiopulmonary disease. Central pulmonary arterial enlargement suggesting changes of pulmonary arterial hypertension. Electronically Signed   By: Fidela Salisbury M.D.   On: 12/08/2021 22:20   DG ERCP  Result Date: 12/09/2021 CLINICAL DATA:  Biliary stones. EXAM: ERCP TECHNIQUE: Multiple spot images obtained with the fluoroscopic device and submitted for interpretation post-procedure. FLUOROSCOPY TIME:  2 minutes, 17 seconds COMPARISON:  CT abdomen pelvis-12/08/2021 FINDINGS: Multiple fluoroscopic images of the right upper abdominal quadrant during ERCP are provided for review. Initial image demonstrates an ERCP probe overlying the right upper abdominal  quadrant. Subsequent images demonstrate opacification of the common bile duct. There is a persistent nonocclusive filling defect within the distal aspect of the CBD compatible with choledocholithiasis demonstrated on preceding abdominal CT. Subsequent images demonstrate insufflation of a balloon within the distal aspect of the CBD with subsequent biliary sweeping and presumed stone extraction and sphincterotomy. IMPRESSION: ERCP with findings of choledocholithiasis with subsequent biliary sweeping and presumed stone extraction. These images were submitted  for radiologic interpretation only. Please see the procedural report for the amount of contrast and the fluoroscopy time utilized. Electronically Signed   By: Sandi Mariscal M.D.   On: 12/09/2021 09:31      Scheduled Meds:  allopurinol  100 mg Oral Daily   indomethacin  100 mg Rectal Once   insulin aspart  0-9 Units Subcutaneous TID WC   levothyroxine  88 mcg Oral Q0600   metoprolol tartrate  25 mg Oral BID   mometasone-formoterol  2 puff Inhalation BID   montelukast  10 mg Oral QHS   Continuous Infusions:  sodium chloride 100 mL/hr at 12/08/21 1429   ciprofloxacin 400 mg (12/09/21 0354)     LOS: 0 days      Time spent: 30 minutes   Dessa Phi, DO [facility] 12/09/2021, 12:35 PM   Available via Epic secure chat 7am-7pm After these hours, please refer to coverage provider listed on [email or web address]

## 2021-12-09 NOTE — Anesthesia Preprocedure Evaluation (Signed)
Anesthesia Evaluation  Patient identified by MRN, date of birth, ID band Patient awake    Reviewed: Allergy & Precautions, NPO status , Patient's Chart, lab work & pertinent test results  Airway Mallampati: II  TM Distance: >3 FB Neck ROM: Full    Dental no notable dental hx.    Pulmonary asthma , former smoker,    Pulmonary exam normal breath sounds clear to auscultation       Cardiovascular hypertension, Pt. on medications negative cardio ROS Normal cardiovascular exam Rhythm:Regular Rate:Normal     Neuro/Psych  Headaches, negative psych ROS   GI/Hepatic negative GI ROS, Neg liver ROS,   Endo/Other  Hypothyroidism   Renal/GU Renal InsufficiencyRenal disease  negative genitourinary   Musculoskeletal  (+) Arthritis , Osteoarthritis,    Abdominal   Peds negative pediatric ROS (+)  Hematology negative hematology ROS (+)   Anesthesia Other Findings   Reproductive/Obstetrics negative OB ROS                             Anesthesia Physical Anesthesia Plan  ASA: 3  Anesthesia Plan: General   Post-op Pain Management:    Induction: Intravenous  PONV Risk Score and Plan: 3 and Ondansetron, Dexamethasone, Midazolam and Treatment may vary due to age or medical condition  Airway Management Planned: Oral ETT  Additional Equipment:   Intra-op Plan:   Post-operative Plan: Extubation in OR  Informed Consent: I have reviewed the patients History and Physical, chart, labs and discussed the procedure including the risks, benefits and alternatives for the proposed anesthesia with the patient or authorized representative who has indicated his/her understanding and acceptance.     Dental advisory given  Plan Discussed with: CRNA  Anesthesia Plan Comments:         Anesthesia Quick Evaluation

## 2021-12-09 NOTE — Progress Notes (Signed)
Brief Cardiology POC Note: Briefly 80 yo F with a history of HTN, HLD, and CKD Stage IIIA  presents with acute biliary disease - Dr. Christella Hartigan had discussed briefly with one of my colleagues 12/08/21 - no angina, high functional status, eval prior to ERCP - Troponin 12->30->34 without CP -  T Wave flattenin without inversion on ECG, no specific findings of ischemia infarction  Preoperative Risk Assessment - The Revised Cardiac Risk Index = 0 which equates to very low risk of perioperative myocardial infarction, pulmonary edema, ventricular fibrillation, cardiac arrest, or complete heart block.  - in re-discussion with Dr. Christella Hartigan, she appears to have significant functional status - No further cardiac testing is recommended prior to today's procedure (ERCP) - The patient may proceed to surgery at acceptable risk.    Riley Lam, MD Cardiologist [facility]  [address] [place], [address] [place], [place] [address] [phone]  8:07 AM

## 2021-12-09 NOTE — Progress Notes (Signed)
Echocardiogram 2D Echocardiogram has been performed.  Warren Lacy Devora Tortorella RDCS 12/09/2021, 12:28 PM

## 2021-12-09 NOTE — Transfer of Care (Signed)
Immediate Anesthesia Transfer of Care Note  Patient: Alexis Shelton  Procedure(s) Performed: ENDOSCOPIC RETROGRADE CHOLANGIOPANCREATOGRAPHY (ERCP) WITH PROPOFOL SPHINCTEROTOMY REMOVAL OF STONES  Patient Location: PACU  Anesthesia Type:General  Level of Consciousness: awake and alert   Airway & Oxygen Therapy: Patient Spontanous Breathing and Patient connected to face mask oxygen  Post-op Assessment: Report given to RN and Post -op Vital signs reviewed and stable  Post vital signs: Reviewed and stable  Last Vitals:  Vitals Value Taken Time  BP 150/69 12/09/21 0924  Temp    Pulse 119 12/09/21 0926  Resp 18 12/09/21 0928  SpO2 100 % 12/09/21 0926  Vitals shown include unvalidated device data.  Last Pain:  Vitals:   12/09/21 0725  TempSrc:   PainSc: 0-No pain         Complications: No notable events documented.

## 2021-12-09 NOTE — Op Note (Signed)
[facility] Patient Name: Alexis Shelton Procedure Date: 12/09/2021 MRN: [id number] Attending MD: Milus Banister , MD Date of Birth: 08/24/1941 CSN: [id number] Age: 80 Admit Type: Inpatient Procedure:                ERCP Indications:              intermittent abd pains, elevated liver tests, CT                            scan showing clear CBD stone and biliary dilation,                            + stones in GB Providers:                Milus Banister, MD, Jeanella Cara, RN,                            Ladona Ridgel, Technician, Glenis Smoker, CRNA Referring MD:              Medicines:                General Anesthesia, Indomethacin 123XX123 mg PR Complications:            No immediate complications. Estimated blood loss:                            None Estimated Blood Loss:     Estimated blood loss: none. Procedure:                Pre-Anesthesia Assessment:                           - Prior to the procedure, a History and Physical                            was performed, and patient medications and                            allergies were reviewed. The patient's tolerance of                            previous anesthesia was also reviewed. The risks                            and benefits of the procedure and the sedation                            options and risks were discussed with the patient.                            All questions were answered, and informed consent                            was obtained. Prior Anticoagulants: The patient has  taken no previous anticoagulant or antiplatelet                            agents. ASA Grade Assessment: II - A patient with                            mild systemic disease. After reviewing the risks                            and benefits, the patient was deemed in                            satisfactory condition to undergo the procedure.                           After obtaining  informed consent, the scope was                            passed under direct vision. Throughout the                            procedure, the patient's blood pressure, pulse, and                            oxygen saturations were monitored continuously. The                            [id number]-[id number] ([id number] Olympus duodenoscope was                            introduced through the mouth, and used to inject                            contrast into and used to inject contrast into the                            bile duct. The ERCP was accomplished without                            difficulty. The patient tolerated the procedure                            well. Scope In: Scope Out: Findings:      The scout film was normal. The duodenoscope was advanced to the region       of the major papilla without detailed examination of the upper GI tract.       The major papilla was normal. I used a 44 autotome over a 0.035 Hydra       wire to cannulate the bile duct and injected contrast. Cholangiogram       revealed diffusely dilated extrahepatic biliary tree, CBD 11 or 12 mm.       There was a clear round mobile filling defect, about 1 cm across, within       the common bile duct. The cystic duct  did not opacify. There was no       biliary leaking. The intrahepatic's were not filled with dye. I       performed an adequate biliary sphincterotomy over the wire and then       swept the duct several times with a biliary retrieval balloon from 9 to       12 mm. This delivered 1 brown multifaceted stone as well as copious       biliary sludge and purulence into the duodenum. An occlusion       cholangiogram showed no further filling defects. The main pancreatic       duct was never cannulated with the wire or injected with dye. Impression:               - Choledocholithiasis causing purulent cholangitis,                            dilated extrahepatic biliary tree. This was treated                             with a biliary sphincterotomy and balloon sweeping.                           - Cystic duct did not opacify. Moderate Sedation:      Not Applicable - Patient had care per Anesthesia. Recommendation:           - Return to hospital room, continue IV antibiotics                            for observed purulent cholangitis for now.                           - I will allow solid food.                           - Follow LFTs.                           - Cholecystectomy timing per general surgery,                            likely this admission. Procedure Code(s):        --- Professional ---                           [phone], Endoscopic retrograde                            cholangiopancreatography (ERCP); with                            sphincterotomy/papillotomy Diagnosis Code(s):        --- Professional ---                           K80.50, Calculus of bile duct without cholangitis  or cholecystitis without obstruction CPT copyright 2019 American Medical Association. All rights reserved. The codes documented in this report are preliminary and upon coder review may  be revised to meet current compliance requirements. Milus Banister, MD 12/09/2021 9:09:44 AM This report has been signed electronically. Number of Addenda: 0

## 2021-12-10 DIAGNOSIS — K8309 Other cholangitis: Secondary | ICD-10-CM | POA: Diagnosis not present

## 2021-12-10 LAB — GLUCOSE, CAPILLARY
Glucose-Capillary: 113 mg/dL — ABNORMAL HIGH (ref 70–99)
Glucose-Capillary: 94 mg/dL (ref 70–99)
Glucose-Capillary: 91 mg/dL (ref 70–99)
Glucose-Capillary: 109 mg/dL — ABNORMAL HIGH (ref 70–99)

## 2021-12-10 LAB — CBC
HCT: 34.7 % — ABNORMAL LOW (ref 36.0–46.0)
Hemoglobin: 10.8 g/dL — ABNORMAL LOW (ref 12.0–15.0)
MCH: 29.3 pg (ref 26.0–34.0)
MCHC: 31.1 g/dL (ref 30.0–36.0)
MCV: 94 fL (ref 80.0–100.0)
Platelets: 180 10*3/uL (ref 150–400)
RBC: 3.69 MIL/uL — ABNORMAL LOW (ref 3.87–5.11)
RDW: 17.1 % — ABNORMAL HIGH (ref 11.5–15.5)
WBC: 8.8 10*3/uL (ref 4.0–10.5)
nRBC: 0 % (ref 0.0–0.2)

## 2021-12-10 LAB — COMPREHENSIVE METABOLIC PANEL
ALT: 300 U/L — ABNORMAL HIGH (ref 0–44)
AST: 208 U/L — ABNORMAL HIGH (ref 15–41)
Albumin: 2.7 g/dL — ABNORMAL LOW (ref 3.5–5.0)
Alkaline Phosphatase: 128 U/L — ABNORMAL HIGH (ref 38–126)
Anion gap: 8 (ref 5–15)
BUN: 24 mg/dL — ABNORMAL HIGH (ref 8–23)
CO2: 24 mmol/L (ref 22–32)
Calcium: 8.1 mg/dL — ABNORMAL LOW (ref 8.9–10.3)
Chloride: 107 mmol/L (ref 98–111)
Creatinine, Ser: 1.5 mg/dL — ABNORMAL HIGH (ref 0.44–1.00)
GFR, Estimated: 35 mL/min — ABNORMAL LOW (ref >60)
Glucose, Bld: 136 mg/dL — ABNORMAL HIGH (ref 70–99)
Potassium: 4.4 mmol/L (ref 3.5–5.1)
Sodium: 139 mmol/L (ref 135–145)
Total Bilirubin: 1.5 mg/dL — ABNORMAL HIGH (ref 0.3–1.2)
Total Protein: 5.7 g/dL — ABNORMAL LOW (ref 6.5–8.1)

## 2021-12-10 MED ORDER — DIPHENHYDRAMINE HCL 50 MG/ML IJ SOLN
12.5000 mg | Freq: Three times a day (TID) | INTRAMUSCULAR | Status: DC | PRN
  Administered 2021-12-10: 10:00:00 12.5 mg via INTRAVENOUS
  Filled 2021-12-10: qty 1

## 2021-12-10 NOTE — H&P (View-Only) (Signed)
1 Day Post-Op  ° °Subjective/Chief Complaint: °Feels well, some ab pain but much better ° ° °Objective: °Vital signs in last 24 hours: °Temp:  [97.6 °F (36.4 °C)-98.7 °F (37.1 °C)] 98 °F (36.7 °C) (12/18 0452) °Pulse Rate:  [57-70] 57 (12/18 0452) °Resp:  [10-18] 14 (12/18 0452) °BP: (126-167)/(55-76) 167/76 (12/18 0452) °SpO2:  [84 %-100 %] 94 % (12/18 0452) °Weight:  [86 kg] 86 kg (12/17 1822) °Last BM Date: 12/07/21 ° °Intake/Output from previous day: °12/17 0701 - 12/18 0700 °In: 2411 [P.O.:780; I.V.:1450; IV Piggyback:181] °Out: 1900 [Urine:1300] °Intake/Output this shift: °No intake/output data recorded. ° °GI: soft nt/nd ° °Lab Results:  °Recent Labs  °  12/09/21 °1132 12/10/21 °0554  °WBC 8.5 8.8  °HGB 11.8* 10.8*  °HCT 37.9 34.7*  °PLT 188 180  ° °BMET °Recent Labs  °  12/09/21 °1132 12/10/21 °0554  °NA 135 139  °K 4.4 4.4  °CL 106 107  °CO2 23 24  °GLUCOSE 109* 136*  °BUN 21 24*  °CREATININE 1.44* 1.50*  °CALCIUM 8.0* 8.1*  ° °PT/INR °Recent Labs  °  12/08/21 °1443  °LABPROT 14.5  °INR 1.1  ° °ABG °Recent Labs  °  12/08/21 °1226  °HCO3 28.2*  ° ° °Studies/Results: °CT ABDOMEN PELVIS W CONTRAST ° °Result Date: 12/08/2021 °CLINICAL DATA:  Acute and nonlocalized abdominal pain EXAM: CT ABDOMEN AND PELVIS WITH CONTRAST TECHNIQUE: Multidetector CT imaging of the abdomen and pelvis was performed using the standard protocol following bolus administration of intravenous contrast. CONTRAST:  80mL OMNIPAQUE IOHEXOL 300 MG/ML  SOLN COMPARISON:  03/25/2020 FINDINGS: Lower chest:  No contributory findings. Hepatobiliary: No focal liver abnormality.Gallstones. Physiologically distended gallbladder and mildly dilated common bile duct (9 mm) with avid mucosal based enhancement of both structures. There is a 1 cm rounded structure in the distal CBD that matches the density of gallstones. Pancreas: No acute finding or ductal dilatation. Spleen: Unremarkable. Adrenals/Urinary Tract: Negative adrenals. No hydronephrosis or  stone. Symmetric atrophy and cortical lobulation. Unremarkable bladder. Stomach/Bowel: No obstruction. No visible bowel inflammation. Distal colonic diverticula which are numerous. Vascular/Lymphatic: No acute vascular abnormality. Scattered atheromatous calcifications. No mass or adenopathy. Reproductive:Hysterectomy. Other: No ascites or pneumoperitoneum.  Small midline fatty hernia. Musculoskeletal: No acute abnormalities. Generalized thoracic and lumbar disc degeneration. Remote T12 compression fracture. IMPRESSION: Choledocholithiasis with distended gallbladder and common bile duct. Electronically Signed   By: Jonathan  Watts M.D.   On: 12/08/2021 07:58  ° °DG CHEST PORT 1 VIEW ° °Result Date: 12/08/2021 °CLINICAL DATA:  Hypoxia, hypertension, stage 3 chronic kidney disease EXAM: PORTABLE CHEST 1 VIEW COMPARISON:  None. FINDINGS: Lungs are clear. No pneumothorax or pleural effusion. Cardiac size is within normal limits. Central pulmonary arteries appear enlarged suggesting changes of pulmonary arterial hypertension. No acute bone abnormality. IMPRESSION: No radiographic evidence of acute cardiopulmonary disease. Central pulmonary arterial enlargement suggesting changes of pulmonary arterial hypertension. Electronically Signed   By: Ashesh  Parikh M.D.   On: 12/08/2021 22:20  ° °DG ERCP ° °Result Date: 12/09/2021 °CLINICAL DATA:  Biliary stones. EXAM: ERCP TECHNIQUE: Multiple spot images obtained with the fluoroscopic device and submitted for interpretation post-procedure. FLUOROSCOPY TIME:  2 minutes, 17 seconds COMPARISON:  CT abdomen pelvis-12/08/2021 FINDINGS: Multiple fluoroscopic images of the right upper abdominal quadrant during ERCP are provided for review. Initial image demonstrates an ERCP probe overlying the right upper abdominal quadrant. Subsequent images demonstrate opacification of the common bile duct. There is a persistent nonocclusive filling defect within the distal aspect of the CBD    compatible with choledocholithiasis demonstrated on preceding abdominal CT. Subsequent images demonstrate insufflation of a balloon within the distal aspect of the CBD with subsequent biliary sweeping and presumed stone extraction and sphincterotomy. IMPRESSION: ERCP with findings of choledocholithiasis with subsequent biliary sweeping and presumed stone extraction. These images were submitted for radiologic interpretation only. Please see the procedural report for the amount of contrast and the fluoroscopy time utilized. Electronically Signed   By: Sandi Mariscal M.D.   On: 12/09/2021 09:31   ECHOCARDIOGRAM COMPLETE  Result Date: 12/09/2021    ECHOCARDIOGRAM REPORT   Patient Name:   ANGENI LIDDELL Celona Date of Exam: 12/09/2021 Medical Rec #:  [id number]     Height:       66.0 in Accession #:    [id number]    Weight:       175.0 lb Date of Birth:  February 26, 1941      BSA:          1.889 m Patient Age:    80 years      BP:           138/69 mmHg Patient Gender: F             HR:           62 bpm. Exam Location:  Inpatient Procedure: 2D Echo, Color Doppler and Cardiac Doppler Indications:    Elevated Troponins  History:        Patient has prior history of Echocardiogram examinations, most                 recent 10/23/2018. Risk Factors:Hypertension and Dyslipidemia.  Sonographer:    Raquel Sarna Senior RDCS Referring Phys: [id number] Mena  1. Left ventricular ejection fraction, by estimation, is 60 to 65%. The left ventricle has normal function. The left ventricle has no regional wall motion abnormalities. Left ventricular diastolic parameters are consistent with Grade I diastolic dysfunction (impaired relaxation).  2. Right ventricular systolic function was not well visualized. The right ventricular size is not well visualized. Tricuspid regurgitation signal is inadequate for assessing PA pressure.  3. The mitral valve is normal in structure. No evidence of mitral valve regurgitation. No evidence of mitral  stenosis.  4. The aortic valve is normal in structure. Aortic valve regurgitation is not visualized. No aortic stenosis is present.  5. The inferior vena cava is normal in size with greater than 50% respiratory variability, suggesting right atrial pressure of 3 mmHg. FINDINGS  Left Ventricle: Left ventricular ejection fraction, by estimation, is 60 to 65%. The left ventricle has normal function. The left ventricle has no regional wall motion abnormalities. The left ventricular internal cavity size was normal in size. There is  no left ventricular hypertrophy. Left ventricular diastolic parameters are consistent with Grade I diastolic dysfunction (impaired relaxation). Indeterminate filling pressures. Right Ventricle: The right ventricular size is not well visualized. No increase in right ventricular wall thickness. Right ventricular systolic function was not well visualized. Tricuspid regurgitation signal is inadequate for assessing PA pressure. Left Atrium: Left atrial size was normal in size. Right Atrium: Right atrial size was not assessed. Pericardium: There is no evidence of pericardial effusion. Mitral Valve: The mitral valve is normal in structure. No evidence of mitral valve regurgitation. No evidence of mitral valve stenosis. Tricuspid Valve: The tricuspid valve is not well visualized. Tricuspid valve regurgitation is not demonstrated. No evidence of tricuspid stenosis. Aortic Valve: The aortic valve is normal in structure. Aortic valve regurgitation is not visualized.  No aortic stenosis is present. Pulmonic Valve: The pulmonic valve was normal in structure. Pulmonic valve regurgitation is not visualized. No evidence of pulmonic stenosis. Aorta: The aortic root is normal in size and structure. Venous: The inferior vena cava is normal in size with greater than 50% respiratory variability, suggesting right atrial pressure of 3 mmHg. IAS/Shunts: The interatrial septum was not well visualized.  LEFT VENTRICLE  PLAX 2D LVIDd:         4.50 cm   Diastology LVIDs:         2.60 cm   LV e' medial:    5.00 cm/s LV PW:         1.00 cm   LV E/e' medial:  13.4 LV IVS:        1.10 cm   LV e' lateral:   6.42 cm/s LVOT diam:     2.20 cm   LV E/e' lateral: 10.4 LV SV:         77 LV SV Index:   41 LVOT Area:     3.80 cm  RIGHT VENTRICLE RV S prime:     9.68 cm/s TAPSE (M-mode): 1.8 cm LEFT ATRIUM             Index        RIGHT ATRIUM           Index LA diam:        4.30 cm 2.28 cm/m   RA Area:     14.20 cm LA Vol (A2C):   50.2 ml 26.57 ml/m  RA Volume:   35.60 ml  18.84 ml/m LA Vol (A4C):   57.6 ml 30.48 ml/m LA Biplane Vol: 58.4 ml 30.91 ml/m  AORTIC VALVE LVOT Vmax:   73.10 cm/s LVOT Vmean:  53.700 cm/s LVOT VTI:    0.203 m  AORTA Ao Root diam: 3.10 cm Ao Asc diam:  3.40 cm MITRAL VALVE MV Area (PHT): 2.53 cm    SHUNTS MV Decel Time: 300 msec    Systemic VTI:  0.20 m MV E velocity: 66.80 cm/s  Systemic Diam: 2.20 cm MV A velocity: 84.40 cm/s MV E/A ratio:  0.79 Mihai Croitoru MD Electronically signed by Thurmon Fair MD Signature Date/Time: 12/09/2021/2:12:06 PM    Final     Anti-infectives: Anti-infectives (From admission, onward)    Start     Dose/Rate Route Frequency Ordered Stop   12/08/21 1600  ciprofloxacin (CIPRO) IVPB 400 mg        400 mg 200 mL/hr over 60 Minutes Intravenous Every 12 hours 12/08/21 1459     12/08/21 1530  ciprofloxacin (CIPRO) IVPB 400 mg  Status:  Discontinued        400 mg 200 mL/hr over 60 Minutes Intravenous Every 24 hours 12/08/21 1431 12/08/21 1459       Assessment/Plan: Choledocholithiasis - ERCP yesterday with Dr Christella Hartigan with stones and pus in duct -pending echo and cards would recommend laparoscopic cholecystectomy during admission and will plan to do this in am tomorrow if well -labs all better FEN - regular diet, NPO at midnight for surgery tomorrow,  IVF per [facility] VTE - SCDs, okay for chemical prophylaxis from a general surgery standpoint- this does not need to be held  for surgery ID - cipro   Elevated Tn - denies CP. Cardiology has already been consulted and await final recs HTN HLD Hypothyroidism CKD3  Prior CVA (2019)  Emelia Loron 12/10/2021

## 2021-12-10 NOTE — Progress Notes (Addendum)
Patient refused Ciprofloxacin IV.  Patient states "I feel that it might be making me itch".  According to patient, she had Cipro in the past.  Reviewed medications that were given to her during this admission in the past 48 hours and informed patient that she had Cipro.  Patient said "Well, it does not mean that if you do not have allergic reaction to the medicine before means that you will not have allergic reaction to it in the future".  Educated on allergic reaction to medications.  Despite education, patient stated, "I still feel like I might have reacted to that yesterday, because I started itching, I just did not tell anyone, but it went away.  I also think that I might have reacted to the metoprolol you gave me this morning."  Reminded patient that she has been taking metoprolol at home.  Dr. Alvino Chapel made aware of patient's refusal to receive Cipro IV at this time.

## 2021-12-10 NOTE — Progress Notes (Signed)
Responded to call from patient. Patient c/o itching, states "I feel itching, primarily in my feet, just like yesterday, started in my feet and then my hands".  Patient states that she uses a cream at home for skin rash.  Noted redness on LLE from scratching.  No hives noted.  Breathing is unlabored and even, lungs are clear.  Dr. Alvino Chapel made aware.

## 2021-12-10 NOTE — Progress Notes (Signed)
PROGRESS NOTE    Alexis Shelton  [id number] DOB: [birth year] DOA: 12/08/2021 PCP: de [place], Raymond J, MD     Brief Narrative:  Alexis Shelton is a 80 y.o. female with medical history significant of osteoarthritis, lumbar DDD, unspecified dizziness, gallstones, hearing loss, cerebellar CVA, hypertension, hyperlipidemia, hypothyroid [place], gout, migraine headaches, mild intermittent asthma, osteoarthritis, history of rheumatoid arthritis, stage III CKD, vitamin D deficiency who is coming to the emergency department due to abdominal pain that she has had on/off for about 2 years.  However, the frequency of this pain has increased over the last few months, more so in the last few weeks.  Yesterday afternoon, the patient stated that her pain got very intense.  She had 3 episodes of emesis after that.  This morning she was unable to tolerate the pain and called EMS.  CT abdomen/pelvis with contrast showed choledocholithiasis with distended gallbladder and CBD. GI, general surgery, cardiology were consulted.  She underwent ERCP on 12/17, biliary sphincterectomy and balloon sweeping.   New events last 24 hours / Subjective: Denies any worsening abdominal pain.  Assessment & Plan:   Principal Problem:   Cholangitis Active Problems:   Essential hypertension   Hyperlipidemia   Hypothyroidism   Stage 3 chronic kidney disease (HCC)   Mild intermittent asthma without complication   Prediabetes   Choledocholithiasis   Elevated liver function tests   Ascending cholangitis with elevated LFTs  -Status post ERCP 12/17, biliary sphincterectomy and balloon sweeping -General surgery following for cholecystectomy this hospitalization -Continue Cipro -Trend LFTs, trended downward today  Essential hypertension -Continue metoprolol  Hypothyroidism -Continue Synthroid  CKD stage IIIb -Baseline Cr 1.3   Prediabetes -SSI   Demand ischemia with elevated troponin -Troponin 32,  34 -Echocardiogram showed EF 60 to 65%, LV with normal function, no regional wall motion abnormality, grade 1 diastolic dysfunction.  I discussed over the phone with Dr. Sallyanne Kuster.  He agrees with Dr. Oralia Rud preop risk assessment, no need for inpatient cardiac testing prior to lap cholecystectomy   DVT prophylaxis:  SCDs Start: 12/08/21 1404  Code Status: Full code Family Communication: No family at bedside Disposition Plan:  Status is: Inpatient  Remains inpatient appropriate because: Remains on IV antibiotics and will need cholecystectomy during this hospital stay      Consultants:  GI General surgery Cardiology  Procedures:  ERCP 12/17  Antimicrobials:  Anti-infectives (From admission, onward)    Start     Dose/Rate Route Frequency Ordered Stop   12/08/21 1600  ciprofloxacin (CIPRO) IVPB 400 mg        400 mg 200 mL/hr over 60 Minutes Intravenous Every 12 hours 12/08/21 1459     12/08/21 1530  ciprofloxacin (CIPRO) IVPB 400 mg  Status:  Discontinued        400 mg 200 mL/hr over 60 Minutes Intravenous Every 24 hours 12/08/21 1431 12/08/21 1459        Objective: Vitals:   12/09/21 2055 12/10/21 0452 12/10/21 0847 12/10/21 0948  BP: (!) 147/55 (!) 167/76  (!) 184/60  Pulse: 70 (!) 57  67  Resp: 14 14  19   Temp: 98.7 F (37.1 C) 98 F (36.7 C)  (!) 97.5 F (36.4 C)  TempSrc: Oral Oral  Oral  SpO2: 95% 94% 94% 92%  Weight:      Height:        Intake/Output Summary (Last 24 hours) at 12/10/2021 1310 Last data filed at 12/10/2021 1100 Gross per 24 hour  Intake 2115.72  ml  Output 2300 ml  Net -184.28 ml    Filed Weights   12/08/21 0606 12/09/21 1822  Weight: 79.4 kg 86 kg    Examination:  General exam: Appears calm and comfortable  Respiratory system: Clear to auscultation. Respiratory effort normal. No respiratory distress. No conversational dyspnea.  Cardiovascular system: S1 & S2 heard, RRR. No murmurs. No pedal edema. Gastrointestinal  system: Abdomen is nondistended, soft and nontender. Normal bowel sounds heard. Central nervous system: Alert and oriented. No focal neurological deficits. Speech clear.  Extremities: Symmetric in appearance  Skin: No rashes, lesions or ulcers on exposed skin  Psychiatry: Judgement and insight appear normal. Mood & affect appropriate.   Data Reviewed: I have personally reviewed following labs and imaging studies  CBC: Recent Labs  Lab 12/08/21 0627 12/08/21 1226 12/09/21 1132 12/10/21 0554  WBC 8.7  --  8.5 8.8  NEUTROABS 7.6  --   --   --   HGB 13.8 12.6 11.8* 10.8*  HCT 44.7 37.0 37.9 34.7*  MCV 93.1  --  94.0 94.0  PLT 249  --  188 [id number]    Basic Metabolic Panel: Recent Labs  Lab 12/08/21 0627 12/08/21 1226 12/09/21 1132 12/10/21 0554  NA 141 140 135 139  K 4.3 4.7 4.4 4.4  CL 104  --  106 107  CO2 26  --  23 24  GLUCOSE 151*  --  109* 136*  BUN 26*  --  21 24*  CREATININE 1.32*  --  1.44* 1.50*  CALCIUM 9.4  --  8.0* 8.1*    GFR: Estimated Creatinine Clearance: 32 mL/min (A) (by C-G formula based on SCr of 1.5 mg/dL (H)). Liver Function Tests: Recent Labs  Lab 12/08/21 0627 12/09/21 1132 12/10/21 0554  AST 678* 332* 208*  ALT 353* 390* 300*  ALKPHOS 155* 142* 128*  BILITOT 1.6* 5.7* 1.5*  PROT 7.2 6.2* 5.7*  ALBUMIN 4.0 3.1* 2.7*    Recent Labs  Lab 12/08/21 0627 12/09/21 1132  LIPASE 103* 78*    No results for input(s): AMMONIA in the last 168 hours. Coagulation Profile: Recent Labs  Lab 12/08/21 1443  INR 1.1    Cardiac Enzymes: No results for input(s): CKTOTAL, CKMB, CKMBINDEX, TROPONINI in the last 168 hours. BNP (last 3 results) No results for input(s): PROBNP in the last 8760 hours. HbA1C: No results for input(s): HGBA1C in the last 72 hours. CBG: Recent Labs  Lab 12/09/21 1038 12/09/21 1630 12/09/21 2058 12/10/21 0803 12/10/21 1156  GLUCAP 87 185* 168* 113* 94    Lipid Profile: No results for input(s): CHOL, HDL,  LDLCALC, TRIG, CHOLHDL, LDLDIRECT in the last 72 hours. Thyroid Function Tests: No results for input(s): TSH, T4TOTAL, FREET4, T3FREE, THYROIDAB in the last 72 hours. Anemia Panel: No results for input(s): VITAMINB12, FOLATE, FERRITIN, TIBC, IRON, RETICCTPCT in the last 72 hours. Sepsis Labs: No results for input(s): PROCALCITON, LATICACIDVEN in the last 168 hours.  Recent Results (from the past 240 hour(s))  Resp Panel by RT-PCR (Flu A&B, Covid) Nasopharyngeal Swab     Status: None   Collection Time: 12/08/21  8:30 AM   Specimen: Nasopharyngeal Swab; Nasopharyngeal(NP) swabs in vial transport medium  Result Value Ref Range Status   SARS Coronavirus 2 by RT PCR NEGATIVE NEGATIVE Final    Comment: (NOTE) SARS-CoV-2 target nucleic acids are NOT DETECTED.  The SARS-CoV-2 RNA is generally detectable in upper respiratory specimens during the acute phase of infection. The lowest concentration of SARS-CoV-2 viral copies  this assay can detect is 138 copies/mL. A negative result does not preclude SARS-Cov-2 infection and should not be used as the sole basis for treatment or other patient management decisions. A negative result may occur with  improper specimen collection/handling, submission of specimen other than nasopharyngeal swab, presence of viral mutation(s) within the areas targeted by this assay, and inadequate number of viral copies(<138 copies/mL). A negative result must be combined with clinical observations, patient history, and epidemiological information. The expected result is Negative.  Fact Sheet for Patients:  BloggerCourse.com  Fact Sheet for Healthcare Providers:  SeriousBroker.it  This test is no t yet approved or cleared by the [place] FDA and  has been authorized for detection and/or diagnosis of SARS-CoV-2 by FDA under an Emergency Use Authorization (EUA). This EUA will remain  in effect (meaning this test  can be used) for the duration of the COVID-19 declaration under Section 564(b)(1) of the Act, 21 U.S.C.section 360bbb-3(b)(1), unless the authorization is terminated  or revoked sooner.       Influenza A by PCR NEGATIVE NEGATIVE Final   Influenza B by PCR NEGATIVE NEGATIVE Final    Comment: (NOTE) The Xpert Xpress SARS-CoV-2/FLU/RSV plus assay is intended as an aid in the diagnosis of influenza from Nasopharyngeal swab specimens and should not be used as a sole basis for treatment. Nasal washings and aspirates are unacceptable for Xpert Xpress SARS-CoV-2/FLU/RSV testing.  Fact Sheet for Patients: BloggerCourse.com  Fact Sheet for Healthcare Providers: SeriousBroker.it  This test is not yet approved or cleared by the [place] FDA and has been authorized for detection and/or diagnosis of SARS-CoV-2 by FDA under an Emergency Use Authorization (EUA). This EUA will remain in effect (meaning this test can be used) for the duration of the COVID-19 declaration under Section 564(b)(1) of the Act, 21 U.S.C. section 360bbb-3(b)(1), unless the authorization is terminated or revoked.  Performed at [facility], [address] [place], [place] [address]        Radiology Studies: DG CHEST PORT 1 VIEW  Result Date: 12/08/2021 CLINICAL DATA:  Hypoxia, hypertension, stage 3 chronic kidney disease EXAM: PORTABLE CHEST 1 VIEW COMPARISON:  None. FINDINGS: Lungs are clear. No pneumothorax or pleural effusion. Cardiac size is within normal limits. Central pulmonary arteries appear enlarged suggesting changes of pulmonary arterial hypertension. No acute bone abnormality. IMPRESSION: No radiographic evidence of acute cardiopulmonary disease. Central pulmonary arterial enlargement suggesting changes of pulmonary arterial hypertension. Electronically Signed   By: Helyn Numbers M.D.   On: 12/08/2021 22:20   DG ERCP  Result  Date: 12/09/2021 CLINICAL DATA:  Biliary stones. EXAM: ERCP TECHNIQUE: Multiple spot images obtained with the fluoroscopic device and submitted for interpretation post-procedure. FLUOROSCOPY TIME:  2 minutes, 17 seconds COMPARISON:  CT abdomen pelvis-12/08/2021 FINDINGS: Multiple fluoroscopic images of the right upper abdominal quadrant during ERCP are provided for review. Initial image demonstrates an ERCP probe overlying the right upper abdominal quadrant. Subsequent images demonstrate opacification of the common bile duct. There is a persistent nonocclusive filling defect within the distal aspect of the CBD compatible with choledocholithiasis demonstrated on preceding abdominal CT. Subsequent images demonstrate insufflation of a balloon within the distal aspect of the CBD with subsequent biliary sweeping and presumed stone extraction and sphincterotomy. IMPRESSION: ERCP with findings of choledocholithiasis with subsequent biliary sweeping and presumed stone extraction. These images were submitted for radiologic interpretation only. Please see the procedural report for the amount of contrast and the fluoroscopy time utilized. Electronically Signed  By: Sandi Mariscal M.D.   On: 12/09/2021 09:31   ECHOCARDIOGRAM COMPLETE  Result Date: 12/09/2021    ECHOCARDIOGRAM REPORT   Patient Name:   ANGELIGUE RAPPLEYEA Spickler Date of Exam: 12/09/2021 Medical Rec #:  [id number]     Height:       66.0 in Accession #:    [id number]    Weight:       175.0 lb Date of Birth:  12/23/1941      BSA:          1.889 m Patient Age:    10 years      BP:           138/69 mmHg Patient Gender: F             HR:           62 bpm. Exam Location:  Inpatient Procedure: 2D Echo, Color Doppler and Cardiac Doppler Indications:    Elevated Troponins  History:        Patient has prior history of Echocardiogram examinations, most                 recent 10/23/2018. Risk Factors:Hypertension and Dyslipidemia.  Sonographer:    Raquel Sarna Senior RDCS Referring Phys:  [id number] [place]  1. Left ventricular ejection fraction, by estimation, is 60 to 65%. The left ventricle has normal function. The left ventricle has no regional wall motion abnormalities. Left ventricular diastolic parameters are consistent with Grade I diastolic dysfunction (impaired relaxation).  2. Right ventricular systolic function was not well visualized. The right ventricular size is not well visualized. Tricuspid regurgitation signal is inadequate for assessing PA pressure.  3. The mitral valve is normal in structure. No evidence of mitral valve regurgitation. No evidence of mitral stenosis.  4. The aortic valve is normal in structure. Aortic valve regurgitation is not visualized. No aortic stenosis is present.  5. The inferior vena cava is normal in size with greater than 50% respiratory variability, suggesting right atrial pressure of 3 mmHg. FINDINGS  Left Ventricle: Left ventricular ejection fraction, by estimation, is 60 to 65%. The left ventricle has normal function. The left ventricle has no regional wall motion abnormalities. The left ventricular internal cavity size was normal in size. There is  no left ventricular hypertrophy. Left ventricular diastolic parameters are consistent with Grade I diastolic dysfunction (impaired relaxation). Indeterminate filling pressures. Right Ventricle: The right ventricular size is not well visualized. No increase in right ventricular wall thickness. Right ventricular systolic function was not well visualized. Tricuspid regurgitation signal is inadequate for assessing PA pressure. Left Atrium: Left atrial size was normal in size. Right Atrium: Right atrial size was not assessed. Pericardium: There is no evidence of pericardial effusion. Mitral Valve: The mitral valve is normal in structure. No evidence of mitral valve regurgitation. No evidence of mitral valve stenosis. Tricuspid Valve: The tricuspid valve is not well visualized. Tricuspid  valve regurgitation is not demonstrated. No evidence of tricuspid stenosis. Aortic Valve: The aortic valve is normal in structure. Aortic valve regurgitation is not visualized. No aortic stenosis is present. Pulmonic Valve: The pulmonic valve was normal in structure. Pulmonic valve regurgitation is not visualized. No evidence of pulmonic stenosis. Aorta: The aortic root is normal in size and structure. Venous: The inferior vena cava is normal in size with greater than 50% respiratory variability, suggesting right atrial pressure of 3 mmHg. IAS/Shunts: The interatrial septum was not well visualized.  LEFT VENTRICLE PLAX 2D LVIDd:  4.50 cm   Diastology LVIDs:         2.60 cm   LV e' medial:    5.00 cm/s LV PW:         1.00 cm   LV E/e' medial:  13.4 LV IVS:        1.10 cm   LV e' lateral:   6.42 cm/s LVOT diam:     2.20 cm   LV E/e' lateral: 10.4 LV SV:         77 LV SV Index:   41 LVOT Area:     3.80 cm  RIGHT VENTRICLE RV S prime:     9.68 cm/s TAPSE (M-mode): 1.8 cm LEFT ATRIUM             Index        RIGHT ATRIUM           Index LA diam:        4.30 cm 2.28 cm/m   RA Area:     14.20 cm LA Vol (A2C):   50.2 ml 26.57 ml/m  RA Volume:   35.60 ml  18.84 ml/m LA Vol (A4C):   57.6 ml 30.48 ml/m LA Biplane Vol: 58.4 ml 30.91 ml/m  AORTIC VALVE LVOT Vmax:   73.10 cm/s LVOT Vmean:  53.700 cm/s LVOT VTI:    0.203 m  AORTA Ao Root diam: 3.10 cm Ao Asc diam:  3.40 cm MITRAL VALVE MV Area (PHT): 2.53 cm    SHUNTS MV Decel Time: 300 msec    Systemic VTI:  0.20 m MV E velocity: 66.80 cm/s  Systemic Diam: 2.20 cm MV A velocity: 84.40 cm/s MV E/A ratio:  0.79 Mihai Croitoru MD Electronically signed by Thurmon Fair MD Signature Date/Time: 12/09/2021/2:12:06 PM    Final       Scheduled Meds:  allopurinol  100 mg Oral Daily   indomethacin  100 mg Rectal Once   insulin aspart  0-9 Units Subcutaneous TID WC   levothyroxine  88 mcg Oral Q0600   metoprolol tartrate  25 mg Oral BID   mometasone-formoterol  2  puff Inhalation BID   montelukast  10 mg Oral QHS   Continuous Infusions:  sodium chloride 100 mL/hr at 12/10/21 0921   ciprofloxacin 400 mg (12/10/21 0411)     LOS: 1 day      Time spent: 20 minutes   Noralee Stain, DO [facility] 12/10/2021, 1:10 PM   Available via Epic secure chat 7am-7pm After these hours, please refer to coverage provider listed on [email or web address]

## 2021-12-10 NOTE — Progress Notes (Signed)
1 Day Post-Op   Subjective/Chief Complaint: Feels well, some ab pain but much better   Objective: Vital signs in last 24 hours: Temp:  [97.6 F (36.4 C)-98.7 F (37.1 C)] 98 F (36.7 C) (12/18 0452) Pulse Rate:  [57-70] 57 (12/18 0452) Resp:  [10-18] 14 (12/18 0452) BP: (126-167)/(55-76) 167/76 (12/18 0452) SpO2:  [84 %-100 %] 94 % (12/18 0452) Weight:  [86 kg] 86 kg (12/17 1822) Last BM Date: 12/07/21  Intake/Output from previous day: 12/17 0701 - 12/18 0700 In: 2411 [P.O.:780; I.V.:1450; IV Piggyback:181] Out: 1900 [Urine:1300] Intake/Output this shift: No intake/output data recorded.  GI: soft nt/nd  Lab Results:  Recent Labs    12/09/21 1132 12/10/21 0554  WBC 8.5 8.8  HGB 11.8* 10.8*  HCT 37.9 34.7*  PLT 188 180   BMET Recent Labs    12/09/21 1132 12/10/21 0554  NA 135 139  K 4.4 4.4  CL 106 107  CO2 23 24  GLUCOSE 109* 136*  BUN 21 24*  CREATININE 1.44* 1.50*  CALCIUM 8.0* 8.1*   PT/INR Recent Labs    12/08/21 1443  LABPROT 14.5  INR 1.1   ABG Recent Labs    12/08/21 1226  HCO3 28.2*    Studies/Results: CT ABDOMEN PELVIS W CONTRAST  Result Date: 12/08/2021 CLINICAL DATA:  Acute and nonlocalized abdominal pain EXAM: CT ABDOMEN AND PELVIS WITH CONTRAST TECHNIQUE: Multidetector CT imaging of the abdomen and pelvis was performed using the standard protocol following bolus administration of intravenous contrast. CONTRAST:  69mL OMNIPAQUE IOHEXOL 300 MG/ML  SOLN COMPARISON:  03/25/2020 FINDINGS: Lower chest:  No contributory findings. Hepatobiliary: No focal liver abnormality.Gallstones. Physiologically distended gallbladder and mildly dilated common bile duct (9 mm) with avid mucosal based enhancement of both structures. There is a 1 cm rounded structure in the distal CBD that matches the density of gallstones. Pancreas: No acute finding or ductal dilatation. Spleen: Unremarkable. Adrenals/Urinary Tract: Negative adrenals. No hydronephrosis or  stone. Symmetric atrophy and cortical lobulation. Unremarkable bladder. Stomach/Bowel: No obstruction. No visible bowel inflammation. Distal colonic diverticula which are numerous. Vascular/Lymphatic: No acute vascular abnormality. Scattered atheromatous calcifications. No mass or adenopathy. Reproductive:Hysterectomy. Other: No ascites or pneumoperitoneum.  Small midline fatty hernia. Musculoskeletal: No acute abnormalities. Generalized thoracic and lumbar disc degeneration. Remote T12 compression fracture. IMPRESSION: Choledocholithiasis with distended gallbladder and common bile duct. Electronically Signed   By: Jorje Guild M.D.   On: 12/08/2021 07:58   DG CHEST PORT 1 VIEW  Result Date: 12/08/2021 CLINICAL DATA:  Hypoxia, hypertension, stage 3 chronic kidney disease EXAM: PORTABLE CHEST 1 VIEW COMPARISON:  None. FINDINGS: Lungs are clear. No pneumothorax or pleural effusion. Cardiac size is within normal limits. Central pulmonary arteries appear enlarged suggesting changes of pulmonary arterial hypertension. No acute bone abnormality. IMPRESSION: No radiographic evidence of acute cardiopulmonary disease. Central pulmonary arterial enlargement suggesting changes of pulmonary arterial hypertension. Electronically Signed   By: Fidela Salisbury M.D.   On: 12/08/2021 22:20   DG ERCP  Result Date: 12/09/2021 CLINICAL DATA:  Biliary stones. EXAM: ERCP TECHNIQUE: Multiple spot images obtained with the fluoroscopic device and submitted for interpretation post-procedure. FLUOROSCOPY TIME:  2 minutes, 17 seconds COMPARISON:  CT abdomen pelvis-12/08/2021 FINDINGS: Multiple fluoroscopic images of the right upper abdominal quadrant during ERCP are provided for review. Initial image demonstrates an ERCP probe overlying the right upper abdominal quadrant. Subsequent images demonstrate opacification of the common bile duct. There is a persistent nonocclusive filling defect within the distal aspect of the CBD  compatible with choledocholithiasis demonstrated on preceding abdominal CT. Subsequent images demonstrate insufflation of a balloon within the distal aspect of the CBD with subsequent biliary sweeping and presumed stone extraction and sphincterotomy. IMPRESSION: ERCP with findings of choledocholithiasis with subsequent biliary sweeping and presumed stone extraction. These images were submitted for radiologic interpretation only. Please see the procedural report for the amount of contrast and the fluoroscopy time utilized. Electronically Signed   By: Sandi Mariscal M.D.   On: 12/09/2021 09:31   ECHOCARDIOGRAM COMPLETE  Result Date: 12/09/2021    ECHOCARDIOGRAM REPORT   Patient Name:   Alexis Shelton Date of Exam: 12/09/2021 Medical Rec #:  [id number]     Height:       66.0 in Accession #:    [id number]    Weight:       175.0 lb Date of Birth:  23-Mar-1941      BSA:          1.889 m Patient Age:    80 years      BP:           138/69 mmHg Patient Gender: F             HR:           62 bpm. Exam Location:  Inpatient Procedure: 2D Echo, Color Doppler and Cardiac Doppler Indications:    Elevated Troponins  History:        Patient has prior history of Echocardiogram examinations, most                 recent 10/23/2018. Risk Factors:Hypertension and Dyslipidemia.  Sonographer:    Raquel Sarna Senior RDCS Referring Phys: [id number] [place]  1. Left ventricular ejection fraction, by estimation, is 60 to 65%. The left ventricle has normal function. The left ventricle has no regional wall motion abnormalities. Left ventricular diastolic parameters are consistent with Grade I diastolic dysfunction (impaired relaxation).  2. Right ventricular systolic function was not well visualized. The right ventricular size is not well visualized. Tricuspid regurgitation signal is inadequate for assessing PA pressure.  3. The mitral valve is normal in structure. No evidence of mitral valve regurgitation. No evidence of mitral  stenosis.  4. The aortic valve is normal in structure. Aortic valve regurgitation is not visualized. No aortic stenosis is present.  5. The inferior vena cava is normal in size with greater than 50% respiratory variability, suggesting right atrial pressure of 3 mmHg. FINDINGS  Left Ventricle: Left ventricular ejection fraction, by estimation, is 60 to 65%. The left ventricle has normal function. The left ventricle has no regional wall motion abnormalities. The left ventricular internal cavity size was normal in size. There is  no left ventricular hypertrophy. Left ventricular diastolic parameters are consistent with Grade I diastolic dysfunction (impaired relaxation). Indeterminate filling pressures. Right Ventricle: The right ventricular size is not well visualized. No increase in right ventricular wall thickness. Right ventricular systolic function was not well visualized. Tricuspid regurgitation signal is inadequate for assessing PA pressure. Left Atrium: Left atrial size was normal in size. Right Atrium: Right atrial size was not assessed. Pericardium: There is no evidence of pericardial effusion. Mitral Valve: The mitral valve is normal in structure. No evidence of mitral valve regurgitation. No evidence of mitral valve stenosis. Tricuspid Valve: The tricuspid valve is not well visualized. Tricuspid valve regurgitation is not demonstrated. No evidence of tricuspid stenosis. Aortic Valve: The aortic valve is normal in structure. Aortic valve regurgitation is not visualized.  No aortic stenosis is present. Pulmonic Valve: The pulmonic valve was normal in structure. Pulmonic valve regurgitation is not visualized. No evidence of pulmonic stenosis. Aorta: The aortic root is normal in size and structure. Venous: The inferior vena cava is normal in size with greater than 50% respiratory variability, suggesting right atrial pressure of 3 mmHg. IAS/Shunts: The interatrial septum was not well visualized.  LEFT VENTRICLE  PLAX 2D LVIDd:         4.50 cm   Diastology LVIDs:         2.60 cm   LV e' medial:    5.00 cm/s LV PW:         1.00 cm   LV E/e' medial:  13.4 LV IVS:        1.10 cm   LV e' lateral:   6.42 cm/s LVOT diam:     2.20 cm   LV E/e' lateral: 10.4 LV SV:         77 LV SV Index:   41 LVOT Area:     3.80 cm  RIGHT VENTRICLE RV S prime:     9.68 cm/s TAPSE (M-mode): 1.8 cm LEFT ATRIUM             Index        RIGHT ATRIUM           Index LA diam:        4.30 cm 2.28 cm/m   RA Area:     14.20 cm LA Vol (A2C):   50.2 ml 26.57 ml/m  RA Volume:   35.60 ml  18.84 ml/m LA Vol (A4C):   57.6 ml 30.48 ml/m LA Biplane Vol: 58.4 ml 30.91 ml/m  AORTIC VALVE LVOT Vmax:   73.10 cm/s LVOT Vmean:  53.700 cm/s LVOT VTI:    0.203 m  AORTA Ao Root diam: 3.10 cm Ao Asc diam:  3.40 cm MITRAL VALVE MV Area (PHT): 2.53 cm    SHUNTS MV Decel Time: 300 msec    Systemic VTI:  0.20 m MV E velocity: 66.80 cm/s  Systemic Diam: 2.20 cm MV A velocity: 84.40 cm/s MV E/A ratio:  0.79 Mihai Croitoru MD Electronically signed by Thurmon Fair MD Signature Date/Time: 12/09/2021/2:12:06 PM    Final     Anti-infectives: Anti-infectives (From admission, onward)    Start     Dose/Rate Route Frequency Ordered Stop   12/08/21 1600  ciprofloxacin (CIPRO) IVPB 400 mg        400 mg 200 mL/hr over 60 Minutes Intravenous Every 12 hours 12/08/21 1459     12/08/21 1530  ciprofloxacin (CIPRO) IVPB 400 mg  Status:  Discontinued        400 mg 200 mL/hr over 60 Minutes Intravenous Every 24 hours 12/08/21 1431 12/08/21 1459       Assessment/Plan: Choledocholithiasis - ERCP yesterday with Dr Christella Hartigan with stones and pus in duct -pending echo and cards would recommend laparoscopic cholecystectomy during admission and will plan to do this in am tomorrow if well -labs all better FEN - regular diet, NPO at midnight for surgery tomorrow,  IVF per TRH VTE - SCDs, okay for chemical prophylaxis from a general surgery standpoint- this does not need to be held  for surgery ID - cipro   Elevated Tn - denies CP. Cardiology has already been consulted and await final recs HTN HLD Hypothyroidism CKD3  Prior CVA (2019)  Emelia Loron 12/10/2021

## 2021-12-11 ENCOUNTER — Inpatient Hospital Stay (HOSPITAL_COMMUNITY): Payer: Medicare Other | Admitting: Certified Registered Nurse Anesthetist

## 2021-12-11 ENCOUNTER — Encounter (HOSPITAL_COMMUNITY): Payer: Self-pay | Admitting: Internal Medicine

## 2021-12-11 ENCOUNTER — Encounter (HOSPITAL_COMMUNITY)
Admission: EM | Disposition: A | Discharge status: Home / Self Care | Payer: Self-pay | Source: Home / Self Care | Attending: Internal Medicine

## 2021-12-11 DIAGNOSIS — K8070 Calculus of gallbladder and bile duct without cholecystitis without obstruction: Secondary | ICD-10-CM | POA: Diagnosis not present

## 2021-12-11 HISTORY — PX: CHOLECYSTECTOMY: SHX55

## 2021-12-11 LAB — COMPREHENSIVE METABOLIC PANEL
ALT: 241 U/L — ABNORMAL HIGH (ref 0–44)
AST: 122 U/L — ABNORMAL HIGH (ref 15–41)
Albumin: 2.9 g/dL — ABNORMAL LOW (ref 3.5–5.0)
Alkaline Phosphatase: 134 U/L — ABNORMAL HIGH (ref 38–126)
Anion gap: 6 (ref 5–15)
BUN: 24 mg/dL — ABNORMAL HIGH (ref 8–23)
CO2: 22 mmol/L (ref 22–32)
Calcium: 8.4 mg/dL — ABNORMAL LOW (ref 8.9–10.3)
Chloride: 112 mmol/L — ABNORMAL HIGH (ref 98–111)
Creatinine, Ser: 1.2 mg/dL — ABNORMAL HIGH (ref 0.44–1.00)
GFR, Estimated: 45 mL/min — ABNORMAL LOW (ref >60)
Glucose, Bld: 94 mg/dL (ref 70–99)
Potassium: 4.4 mmol/L (ref 3.5–5.1)
Sodium: 140 mmol/L (ref 135–145)
Total Bilirubin: 1.1 mg/dL (ref 0.3–1.2)
Total Protein: 5.9 g/dL — ABNORMAL LOW (ref 6.5–8.1)

## 2021-12-11 LAB — GLUCOSE, CAPILLARY
Glucose-Capillary: 91 mg/dL (ref 70–99)
Glucose-Capillary: 105 mg/dL — ABNORMAL HIGH (ref 70–99)
Glucose-Capillary: 123 mg/dL — ABNORMAL HIGH (ref 70–99)
Glucose-Capillary: 175 mg/dL — ABNORMAL HIGH (ref 70–99)

## 2021-12-11 SURGERY — LAPAROSCOPIC CHOLECYSTECTOMY WITH INTRAOPERATIVE CHOLANGIOGRAM
Anesthesia: General | Site: Abdomen

## 2021-12-11 MED ORDER — LIDOCAINE HCL (PF) 1 % IJ SOLN
INTRAMUSCULAR | Status: DC | PRN
  Administered 2021-12-11: 7.5 mL

## 2021-12-11 MED ORDER — LIDOCAINE HCL (PF) 2 % IJ SOLN
INTRAMUSCULAR | Status: AC
  Filled 2021-12-11: qty 5

## 2021-12-11 MED ORDER — EPHEDRINE 5 MG/ML INJ
INTRAVENOUS | Status: AC
  Filled 2021-12-11: qty 5

## 2021-12-11 MED ORDER — FENTANYL CITRATE (PF) 100 MCG/2ML IJ SOLN
INTRAMUSCULAR | Status: AC
  Filled 2021-12-11: qty 2

## 2021-12-11 MED ORDER — DEXAMETHASONE SODIUM PHOSPHATE 10 MG/ML IJ SOLN
INTRAMUSCULAR | Status: AC
  Filled 2021-12-11: qty 1

## 2021-12-11 MED ORDER — FENTANYL CITRATE PF 50 MCG/ML IJ SOSY
PREFILLED_SYRINGE | INTRAMUSCULAR | Status: AC
  Filled 2021-12-11: qty 1

## 2021-12-11 MED ORDER — OXYCODONE HCL 5 MG PO TABS
2.5000 mg | ORAL_TABLET | ORAL | Status: DC | PRN
  Administered 2021-12-11 (×2): 5 mg via ORAL
  Filled 2021-12-11 (×2): qty 1

## 2021-12-11 MED ORDER — PROPOFOL 500 MG/50ML IV EMUL
INTRAVENOUS | Status: AC
  Filled 2021-12-11: qty 50

## 2021-12-11 MED ORDER — EPHEDRINE SULFATE-NACL 50-0.9 MG/10ML-% IV SOSY
PREFILLED_SYRINGE | INTRAVENOUS | Status: DC | PRN
  Administered 2021-12-11: 5 mg via INTRAVENOUS

## 2021-12-11 MED ORDER — LIDOCAINE HCL (PF) 1 % IJ SOLN
INTRAMUSCULAR | Status: AC
  Filled 2021-12-11: qty 30

## 2021-12-11 MED ORDER — LACTATED RINGERS IR SOLN
Status: DC | PRN
  Administered 2021-12-11: 1000 mL

## 2021-12-11 MED ORDER — OXYCODONE HCL 5 MG PO TABS: 5.0000 mg | ORAL_TABLET | Freq: Once | ORAL | Status: DC | PRN

## 2021-12-11 MED ORDER — PHENYLEPHRINE 40 MCG/ML (10ML) SYRINGE FOR IV PUSH (FOR BLOOD PRESSURE SUPPORT)
PREFILLED_SYRINGE | INTRAVENOUS | Status: DC | PRN
  Administered 2021-12-11: 80 ug via INTRAVENOUS

## 2021-12-11 MED ORDER — BUPIVACAINE-EPINEPHRINE (PF) 0.25% -1:200000 IJ SOLN
INTRAMUSCULAR | Status: AC
  Filled 2021-12-11: qty 30

## 2021-12-11 MED ORDER — SUGAMMADEX SODIUM 200 MG/2ML IV SOLN
INTRAVENOUS | Status: DC | PRN
  Administered 2021-12-11: 200 mg via INTRAVENOUS

## 2021-12-11 MED ORDER — ONDANSETRON HCL 4 MG/2ML IJ SOLN
INTRAMUSCULAR | Status: DC | PRN
  Administered 2021-12-11: 4 mg via INTRAVENOUS

## 2021-12-11 MED ORDER — PROPOFOL 10 MG/ML IV BOLUS
INTRAVENOUS | Status: DC | PRN
  Administered 2021-12-11: 30 mg via INTRAVENOUS
  Administered 2021-12-11: 130 mg via INTRAVENOUS

## 2021-12-11 MED ORDER — FENTANYL CITRATE PF 50 MCG/ML IJ SOSY
25.0000 ug | PREFILLED_SYRINGE | INTRAMUSCULAR | Status: DC | PRN
  Administered 2021-12-11 (×2): 25 ug via INTRAVENOUS
  Administered 2021-12-11 (×2): 50 ug via INTRAVENOUS

## 2021-12-11 MED ORDER — ACETAMINOPHEN 500 MG PO TABS: 1000.0000 mg | ORAL_TABLET | Freq: Once | ORAL | Status: DC

## 2021-12-11 MED ORDER — AMLODIPINE BESYLATE 5 MG PO TABS
5.0000 mg | ORAL_TABLET | Freq: Every day | ORAL | Status: DC
  Administered 2021-12-11 – 2021-12-12 (×2): 5 mg via ORAL
  Filled 2021-12-11 (×2): qty 1

## 2021-12-11 MED ORDER — SIMETHICONE 80 MG PO CHEW
80.0000 mg | CHEWABLE_TABLET | Freq: Four times a day (QID) | ORAL | Status: DC | PRN
  Administered 2021-12-11: 15:00:00 80 mg via ORAL
  Filled 2021-12-11: qty 1

## 2021-12-11 MED ORDER — OXYCODONE HCL 5 MG/5ML PO SOLN: 5.0000 mg | Freq: Once | ORAL | Status: DC | PRN

## 2021-12-11 MED ORDER — PROSOURCE PLUS PO LIQD
30.0000 mL | Freq: Two times a day (BID) | ORAL | Status: DC
  Administered 2021-12-11: 21:00:00 30 mL via ORAL
  Filled 2021-12-11: qty 30

## 2021-12-11 MED ORDER — DEXAMETHASONE SODIUM PHOSPHATE 4 MG/ML IJ SOLN
INTRAMUSCULAR | Status: DC | PRN
  Administered 2021-12-11: 5 mg via INTRAVENOUS

## 2021-12-11 MED ORDER — ADULT MULTIVITAMIN W/MINERALS CH
1.0000 | ORAL_TABLET | ORAL | Status: DC
  Administered 2021-12-11: 14:00:00 1 via ORAL
  Filled 2021-12-11: qty 1

## 2021-12-11 MED ORDER — ONDANSETRON HCL 4 MG/2ML IJ SOLN
INTRAMUSCULAR | Status: AC
  Filled 2021-12-11: qty 2

## 2021-12-11 MED ORDER — LACTATED RINGERS IV SOLN: INTRAVENOUS | Status: DC

## 2021-12-11 MED ORDER — CHLORHEXIDINE GLUCONATE 0.12 % MT SOLN
15.0000 mL | Freq: Once | OROMUCOSAL | Status: AC
  Administered 2021-12-11: 10:00:00 15 mL via OROMUCOSAL

## 2021-12-11 MED ORDER — BUPIVACAINE-EPINEPHRINE (PF) 0.25% -1:200000 IJ SOLN
INTRAMUSCULAR | Status: DC | PRN
  Administered 2021-12-11: 7.5 mL

## 2021-12-11 MED ORDER — ROCURONIUM BROMIDE 10 MG/ML (PF) SYRINGE
PREFILLED_SYRINGE | INTRAVENOUS | Status: DC | PRN
  Administered 2021-12-11: 50 mg via INTRAVENOUS

## 2021-12-11 MED ORDER — LIDOCAINE 2% (20 MG/ML) 5 ML SYRINGE
INTRAMUSCULAR | Status: DC | PRN
  Administered 2021-12-11: 100 mg via INTRAVENOUS

## 2021-12-11 MED ORDER — BOOST / RESOURCE BREEZE PO LIQD CUSTOM
1.0000 | Freq: Two times a day (BID) | ORAL | Status: DC
  Administered 2021-12-11 – 2021-12-12 (×2): 1 via ORAL

## 2021-12-11 MED ORDER — PROPOFOL 500 MG/50ML IV EMUL
INTRAVENOUS | Status: DC | PRN
  Administered 2021-12-11: 25 ug/kg/min via INTRAVENOUS

## 2021-12-11 MED ORDER — ROCURONIUM BROMIDE 10 MG/ML (PF) SYRINGE
PREFILLED_SYRINGE | INTRAVENOUS | Status: AC
  Filled 2021-12-11: qty 10

## 2021-12-11 MED ORDER — METRONIDAZOLE 500 MG/100ML IV SOLN
500.0000 mg | Freq: Two times a day (BID) | INTRAVENOUS | Status: DC
  Administered 2021-12-11 – 2021-12-12 (×2): 500 mg via INTRAVENOUS
  Filled 2021-12-11 (×2): qty 100

## 2021-12-11 MED ORDER — PHENYLEPHRINE 40 MCG/ML (10ML) SYRINGE FOR IV PUSH (FOR BLOOD PRESSURE SUPPORT)
PREFILLED_SYRINGE | INTRAVENOUS | Status: AC
  Filled 2021-12-11: qty 10

## 2021-12-11 MED ORDER — FENTANYL CITRATE (PF) 100 MCG/2ML IJ SOLN
INTRAMUSCULAR | Status: DC | PRN
  Administered 2021-12-11 (×4): 50 ug via INTRAVENOUS

## 2021-12-11 MED ORDER — FENTANYL CITRATE PF 50 MCG/ML IJ SOSY
PREFILLED_SYRINGE | INTRAMUSCULAR | Status: AC
  Filled 2021-12-11: qty 2

## 2021-12-11 MED ORDER — POLYETHYLENE GLYCOL 3350 17 G PO PACK
17.0000 g | PACK | Freq: Every day | ORAL | Status: DC
  Administered 2021-12-11 – 2021-12-12 (×2): 17 g via ORAL
  Filled 2021-12-11 (×2): qty 1

## 2021-12-11 SURGICAL SUPPLY — 44 items
APPLIER CLIP ROT 10 11.4 M/L (STAPLE) ×3
BAG COUNTER SPONGE SURGICOUNT (BAG) IMPLANT
BAG SURGICOUNT SPONGE COUNTING (BAG)
CABLE HIGH FREQUENCY MONO STRZ (ELECTRODE) ×3 IMPLANT
CHLORAPREP W/TINT 26 (MISCELLANEOUS) ×3 IMPLANT
CLIP APPLIE ROT 10 11.4 M/L (STAPLE) ×1 IMPLANT
CLIP LIGATING HEMO O LOK GREEN (MISCELLANEOUS) IMPLANT
COVER MAYO STAND STRL (DRAPES) ×2 IMPLANT
COVER SURGICAL LIGHT HANDLE (MISCELLANEOUS) ×3 IMPLANT
DECANTER SPIKE VIAL GLASS SM (MISCELLANEOUS) ×1 IMPLANT
DERMABOND ADVANCED (GAUZE/BANDAGES/DRESSINGS) ×4
DERMABOND ADVANCED .7 DNX12 (GAUZE/BANDAGES/DRESSINGS) IMPLANT
DRAPE C-ARM 42X120 X-RAY (DRAPES) IMPLANT
ELECT L-HOOK LAP 45CM DISP (ELECTROSURGICAL)
ELECT REM PT RETURN 15FT ADLT (MISCELLANEOUS) ×3 IMPLANT
ELECTRODE L-HOOK LAP 45CM DISP (ELECTROSURGICAL) IMPLANT
GLOVE SURG ENC MOIS LTX SZ6 (GLOVE) ×3 IMPLANT
GLOVE SURG UNDER LTX SZ6.5 (GLOVE) ×3 IMPLANT
GOWN STRL REUS W/TWL 2XL LVL3 (GOWN DISPOSABLE) ×3 IMPLANT
GOWN STRL REUS W/TWL XL LVL3 (GOWN DISPOSABLE) ×6 IMPLANT
HEMOSTAT SNOW SURGICEL 2X4 (HEMOSTASIS) ×2 IMPLANT
IRRIG SUCT STRYKERFLOW 2 WTIP (MISCELLANEOUS) ×3
IRRIGATION SUCT STRKRFLW 2 WTP (MISCELLANEOUS) ×1 IMPLANT
KIT BASIN OR (CUSTOM PROCEDURE TRAY) ×3 IMPLANT
KIT TURNOVER KIT A (KITS) IMPLANT
L-HOOK LAP DISP 36CM (ELECTROSURGICAL) ×3
LHOOK LAP DISP 36CM (ELECTROSURGICAL) IMPLANT
PENCIL SMOKE EVACUATOR (MISCELLANEOUS) IMPLANT
POUCH RETRIEVAL ECOSAC 10 (ENDOMECHANICALS) ×1 IMPLANT
POUCH RETRIEVAL ECOSAC 10MM (ENDOMECHANICALS) ×2
POUCH SPECIMEN RETRIEVAL 10MM (ENDOMECHANICALS) ×3 IMPLANT
PROTECTOR NERVE ULNAR (MISCELLANEOUS) IMPLANT
SCISSORS LAP 5X35 DISP (ENDOMECHANICALS) ×3 IMPLANT
SET CHOLANGIOGRAPH MIX (MISCELLANEOUS) IMPLANT
SET TUBE SMOKE EVAC HIGH FLOW (TUBING) IMPLANT
SLEEVE XCEL OPT CAN 5 100 (ENDOMECHANICALS) ×3 IMPLANT
SUT MNCRL AB 4-0 PS2 18 (SUTURE) ×3 IMPLANT
TAPE CLOTH 4X10 WHT NS (GAUZE/BANDAGES/DRESSINGS) IMPLANT
TOWEL OR 17X26 10 PK STRL BLUE (TOWEL DISPOSABLE) ×3 IMPLANT
TOWEL OR NON WOVEN STRL DISP B (DISPOSABLE) ×3 IMPLANT
TRAY LAPAROSCOPIC (CUSTOM PROCEDURE TRAY) ×3 IMPLANT
TROCAR BLADELESS OPT 5 100 (ENDOMECHANICALS) ×3 IMPLANT
TROCAR XCEL BLUNT TIP 100MML (ENDOMECHANICALS) ×3 IMPLANT
TROCAR XCEL NON-BLD 11X100MML (ENDOMECHANICALS) ×3 IMPLANT

## 2021-12-11 NOTE — Interval H&P Note (Signed)
History and Physical Interval Note:  12/11/2021 9:03 AM  Alexis Shelton  has presented today for surgery, with the diagnosis of gallstones.  The various methods of treatment have been discussed with the patient and family. After consideration of risks, benefits and other options for treatment, the patient has consented to  Procedure(s): LAPAROSCOPIC CHOLECYSTECTOMY WITH POSSIBLE INTRAOPERATIVE CHOLANGIOGRAM (N/A) as a surgical intervention.  The patient's history has been reviewed, patient examined, no change in status, stable for surgery.  I have reviewed the patient's chart and labs.  Questions were answered to the patient's satisfaction.     Almond Lint

## 2021-12-11 NOTE — Progress Notes (Signed)
Patient was taken down for surgery, her two rings and cell phone along with her jacket and clothes were left in the patient's room.

## 2021-12-11 NOTE — Anesthesia Procedure Notes (Signed)
Procedure Name: Intubation Date/Time: 12/11/2021 10:35 AM Performed by: Vanessa Palmas, CRNA Pre-anesthesia Checklist: Patient identified, Emergency Drugs available, Suction available and Patient being monitored Patient Re-evaluated:Patient Re-evaluated prior to induction Oxygen Delivery Method: Circle system utilized Preoxygenation: Pre-oxygenation with 100% oxygen Induction Type: IV induction Ventilation: Mask ventilation without difficulty Laryngoscope Size: 2 and Miller Grade View: Grade I Tube type: Oral Tube size: 7.0 mm Number of attempts: 1 Airway Equipment and Method: Stylet Placement Confirmation: ETT inserted through vocal cords under direct vision, positive ETCO2 and breath sounds checked- equal and bilateral Secured at: 21 cm Tube secured with: Tape Dental Injury: Teeth and Oropharynx as per pre-operative assessment

## 2021-12-11 NOTE — Transfer of Care (Signed)
Immediate Anesthesia Transfer of Care Note  Patient: Alexis Shelton  Procedure(s) Performed: LAPAROSCOPIC CHOLECYSTECTOMY (Abdomen)  Patient Location: PACU  Anesthesia Type:General  Level of Consciousness: awake, alert  and patient cooperative  Airway & Oxygen Therapy: Patient Spontanous Breathing and Patient connected to face mask  Post-op Assessment: Report given to RN and Post -op Vital signs reviewed and stable  Post vital signs: Reviewed and stable  Last Vitals:  Vitals Value Taken Time  BP 211/75 12/11/21 1149  Temp    Pulse 69 12/11/21 1152  Resp 21 12/11/21 1152  SpO2 98 % 12/11/21 1152  Vitals shown include unvalidated device data.  Last Pain:  Vitals:   12/11/21 0948  TempSrc: Oral  PainSc: 0-No pain      Patients Stated Pain Goal: 0 (12/10/21 2135)  Complications: No notable events documented.

## 2021-12-11 NOTE — Progress Notes (Addendum)
Initial Nutrition Assessment  DOCUMENTATION CODES:   Not applicable  INTERVENTION:  - diet re-advancement as medically feasible. - will order Boost Breeze BID, each supplement provides 250 kcal and 9 grams of protein. - will order 30 ml Prosource Plus BID, each supplement provides 100 kcal and 15 grams protein.  - will order 1 tablet multivitamin with minerals/day. - complete NFPE when feasible.   NUTRITION DIAGNOSIS:   Increased nutrient needs related to acute illness, post-op healing as evidenced by estimated needs.  GOAL:   Patient will meet greater than or equal to 90% of their needs  MONITOR:   Diet advancement, Labs, Weight trends, Skin  REASON FOR ASSESSMENT:   Malnutrition Screening Tool  ASSESSMENT:   80 y.o. female with medical history of osteoarthritis, lumbar DDD, unspecified dizziness, gallstones, hearing loss, cerebellar CVA, HTN, HLD, hypothyroid [place], gout, migraine headaches, mild intermittent asthma, osteoarthritis, hx of rheumatoid arthritis, stage 3 CKD, and vitamin D deficiency. She presented to the ED due to abdominal pain on and off x2 years which has worsened in the last few months and especially in the last few weeks. The day PTA pain became very intense and led to 3 episodes of emesis. She had a BM the day PTA which was normal.  Diet advanced from NPO to CLD on 12/16 at 1430, back to NPO at midnight on 12/17, advanced to Heart Healthy on 12/17 at 1001, and back to NPO at midnight last night.  She is noted to have consumed 100% of dinner on 12/17; 100% of breakfast, 50% of lunch, and 25% of dinner on 12/18.  She has not been seen by a [place] RD at any time in the past.   Weight on 12/17 was documented as 190 lb, weight on 12/16 was documented as 175 lb (appears to be a stated weight), and weight on 12/12 and 10/17 was 176 lb.   No information documented in the edema section of flow sheet.   She underwent ERCP on 12/17. She is currently out  of the room to OR for lap chole with possible intraoperative cholangiogram.   GI note from today states patient was able to eat 12/18 without N/V.    Labs reviewed; CBG: 91 mg/dl, Cl: 112 mmol/l, BUN: 24 mg/dl, creatinine: 1.2 mg/dl, Ca: 8.4 mg/dl, Alk Phos elevated, LFTs elevated but trending down, GFR: 45 ml/min.   Medications reviewed; sliding scale novolog, 88 mcg oral synthroid/day.  IVF; NS @ 100 ml/hr.     NUTRITION - FOCUSED PHYSICAL EXAM:  Patient in OR.  Diet Order:   Diet Order             Diet NPO time specified  Diet effective midnight                   EDUCATION NEEDS:   Not appropriate for education at this time  Skin:  Skin Assessment: Skin Integrity Issues: Skin Integrity Issues:: Incisions Incisions: abdomen (12/19)  Last BM:  12/16 per nursing flow sheet  Height:   Ht Readings from Last 1 Encounters:  12/08/21 5' 6"  (1.676 m)    Weight:   Wt Readings from Last 1 Encounters:  12/09/21 86 kg     Estimated Nutritional Needs:  Kcal:  1900-2100 kcal Protein:  95-110 grams Fluid:  >/= 2.1 L/day      Jarome Matin, MS, RD, LDN, CNSC Inpatient Clinical Dietitian RD pager # available in [place]  After hours/weekend pager # available in [facility]

## 2021-12-11 NOTE — Progress Notes (Signed)
°  Transition of Care [facility]) Screening Note   Patient Details  Name: Alexis Shelton Date of Birth: [birth year]   Transition of Care [facility]) CM/SW Contact:    Surafel Hilleary, Meriam Sprague, RN Phone Number: 12/11/2021, 2:11 PM    Transition of Care Department [facility]) has reviewed patient and no TOC needs have been identified at this time. We will continue to monitor patient advancement through interdisciplinary progression rounds. If new patient transition needs arise, please place a TOC consult.

## 2021-12-11 NOTE — Progress Notes (Signed)
Progress Note   Subjective  Chief Complaint: Choledocholithiasis, cholelithiasis   [name] with Dr. Ardis Hughs with biliary sphincterotomy and balloon sweeping.  Patient states her abdominal pain has improved greatly, denies any abdominal pain today.     Is n.p.o. today for cholecystectomy with general surgery, but did eat yesterday without nausea, vomiting. Patient has baseline shortness of breath, states she has a slight cough.  Denies productive cough, chest pain, leg swelling.   Objective   Vital signs in last 24 hours: Temp:  [97.5 F (36.4 C)-98.9 F (37.2 C)] 98.2 F (36.8 C) (12/19 0524) Pulse Rate:  [58-67] 61 (12/19 0524) Resp:  [14-19] 14 (12/19 0524) BP: (182-191)/(60-100) 191/72 (12/19 0524) SpO2:  [91 %-100 %] 100 % (12/19 0524) Last BM Date: 12/08/21  General:   Pleasant, well developed female in no acute distress Eyes: sclerae anicteric,conjunctive pink, Heart:  regular rate and rhythm, no murmurs or gallops Pulm: Clear anteriorly; no wheezing Abdomen:  Soft, Obese AB, skin exam normal, Normal bowel sounds. Non-tender.  Extremities:  Without edema. Neurologic:  Alert and  oriented x4;  grossly normal neurologically.  Psychiatric: Demonstrates good judgement and reason without abnormal affect or behaviors.  Intake/Output from previous day: 12/18 0701 - 12/19 0700 In: 3283.5 [P.O.:660; I.V.:2423.5; IV Piggyback:200] Out: 400 [Urine:400] Intake/Output this shift: No intake/output data recorded.  Lab Results: Recent Labs    12/08/21 1226 12/09/21 1132 12/10/21 0554  WBC  --  8.5 8.8  HGB 12.6 11.8* 10.8*  HCT 37.0 37.9 34.7*  PLT  --  188 180   BMET Recent Labs    12/09/21 1132 12/10/21 0554 12/11/21 0514  NA 135 139 140  K 4.4 4.4 4.4  CL 106 107 112*  CO2 23 24 22   GLUCOSE 109* 136* 94  BUN 21 24* 24*  CREATININE 1.44* 1.50* 1.20*  CALCIUM 8.0* 8.1* 8.4*   LFT Recent Labs    12/11/21 0514  PROT 5.9*  ALBUMIN  2.9*  AST 122*  ALT 241*  ALKPHOS 134*  BILITOT 1.1   PT/INR Recent Labs    12/08/21 1443  LABPROT 14.5  INR 1.1    Studies/Results: DG ERCP  Result Date: 12/09/2021 CLINICAL DATA:  Biliary stones. EXAM: ERCP TECHNIQUE: Multiple spot images obtained with the fluoroscopic device and submitted for interpretation post-procedure. FLUOROSCOPY TIME:  2 minutes, 17 seconds COMPARISON:  CT abdomen pelvis-12/08/2021 FINDINGS: Multiple fluoroscopic images of the right upper abdominal quadrant during ERCP are provided for review. Initial image demonstrates an ERCP probe overlying the right upper abdominal quadrant. Subsequent images demonstrate opacification of the common bile duct. There is a persistent nonocclusive filling defect within the distal aspect of the CBD compatible with choledocholithiasis demonstrated on preceding abdominal CT. Subsequent images demonstrate insufflation of a balloon within the distal aspect of the CBD with subsequent biliary sweeping and presumed stone extraction and sphincterotomy. IMPRESSION: ERCP with findings of choledocholithiasis with subsequent biliary sweeping and presumed stone extraction. These images were submitted for radiologic interpretation only. Please see the procedural report for the amount of contrast and the fluoroscopy time utilized. Electronically Signed   By: Sandi Mariscal M.D.   On: 12/09/2021 09:31   ECHOCARDIOGRAM COMPLETE  Result Date: 12/09/2021    ECHOCARDIOGRAM REPORT   Patient Name:   Alexis Shelton Date of Exam: 12/09/2021 Medical Rec #:  [id number]     Height:       66.0 in Accession #:    [id number]  Weight:       175.0 lb Date of Birth:  [birth year]      BSA:          1.889 m Patient Age:    80 years      BP:           138/69 mmHg Patient Gender: F             HR:           62 bpm. Exam Location:  Inpatient Procedure: 2D Echo, Color Doppler and Cardiac Doppler Indications:    Elevated Troponins  History:        Patient has prior history of  Echocardiogram examinations, most                 recent 10/23/2018. Risk Factors:Hypertension and Dyslipidemia.  Sonographer:    Irving Burton Senior RDCS Referring Phys: [id number] DAVID MANUEL ORTIZ IMPRESSIONS  1. Left ventricular ejection fraction, by estimation, is 60 to 65%. The left ventricle has normal function. The left ventricle has no regional wall motion abnormalities. Left ventricular diastolic parameters are consistent with Grade I diastolic dysfunction (impaired relaxation).  2. Right ventricular systolic function was not well visualized. The right ventricular size is not well visualized. Tricuspid regurgitation signal is inadequate for assessing PA pressure.  3. The mitral valve is normal in structure. No evidence of mitral valve regurgitation. No evidence of mitral stenosis.  4. The aortic valve is normal in structure. Aortic valve regurgitation is not visualized. No aortic stenosis is present.  5. The inferior vena cava is normal in size with greater than 50% respiratory variability, suggesting right atrial pressure of 3 mmHg. FINDINGS  Left Ventricle: Left ventricular ejection fraction, by estimation, is 60 to 65%. The left ventricle has normal function. The left ventricle has no regional wall motion abnormalities. The left ventricular internal cavity size was normal in size. There is  no left ventricular hypertrophy. Left ventricular diastolic parameters are consistent with Grade I diastolic dysfunction (impaired relaxation). Indeterminate filling pressures. Right Ventricle: The right ventricular size is not well visualized. No increase in right ventricular wall thickness. Right ventricular systolic function was not well visualized. Tricuspid regurgitation signal is inadequate for assessing PA pressure. Left Atrium: Left atrial size was normal in size. Right Atrium: Right atrial size was not assessed. Pericardium: There is no evidence of pericardial effusion. Mitral Valve: The mitral valve is normal in  structure. No evidence of mitral valve regurgitation. No evidence of mitral valve stenosis. Tricuspid Valve: The tricuspid valve is not well visualized. Tricuspid valve regurgitation is not demonstrated. No evidence of tricuspid stenosis. Aortic Valve: The aortic valve is normal in structure. Aortic valve regurgitation is not visualized. No aortic stenosis is present. Pulmonic Valve: The pulmonic valve was normal in structure. Pulmonic valve regurgitation is not visualized. No evidence of pulmonic stenosis. Aorta: The aortic root is normal in size and structure. Venous: The inferior vena cava is normal in size with greater than 50% respiratory variability, suggesting right atrial pressure of 3 mmHg. IAS/Shunts: The interatrial septum was not well visualized.  LEFT VENTRICLE PLAX 2D LVIDd:         4.50 cm   Diastology LVIDs:         2.60 cm   LV e' medial:    5.00 cm/s LV PW:         1.00 cm   LV E/e' medial:  13.4 LV IVS:        1.10 cm  LV e' lateral:   6.42 cm/s LVOT diam:     2.20 cm   LV E/e' lateral: 10.4 LV SV:         77 LV SV Index:   41 LVOT Area:     3.80 cm  RIGHT VENTRICLE RV S prime:     9.68 cm/s TAPSE (M-mode): 1.8 cm LEFT ATRIUM             Index        RIGHT ATRIUM           Index LA diam:        4.30 cm 2.28 cm/m   RA Area:     14.20 cm LA Vol (A2C):   50.2 ml 26.57 ml/m  RA Volume:   35.60 ml  18.84 ml/m LA Vol (A4C):   57.6 ml 30.48 ml/m LA Biplane Vol: 58.4 ml 30.91 ml/m  AORTIC VALVE LVOT Vmax:   73.10 cm/s LVOT Vmean:  53.700 cm/s LVOT VTI:    0.203 m  AORTA Ao Root diam: 3.10 cm Ao Asc diam:  3.40 cm MITRAL VALVE MV Area (PHT): 2.53 cm    SHUNTS MV Decel Time: 300 msec    Systemic VTI:  0.20 m MV E velocity: 66.80 cm/s  Systemic Diam: 2.20 cm MV A velocity: 84.40 cm/s MV E/A ratio:  0.79 Mihai Croitoru MD Electronically signed by Sanda Klein MD Signature Date/Time: 12/09/2021/2:12:06 PM    Final        Impression     Choledocholithiasis/cholangitis  2 days s/p ERCP with  biliary sphincterotomy and balloon sweeping with Dr. Ardis Hughs  Plan for cholecystectomy today Patient's liver function trending down, total bili now 1.1 down from 5.7 No abdominal pain, nausea, vomiting, fever, chills.  Remains on cipro IV WBC 8.8 (8.5) HGB 10.8(11.8) (12.6) Platelets 180 AST 122(208)(332) ALT 241 (300) (390) Alkphos 134 (128) (142) TBili 1.1 (1.5) (5.7)  Elevated troponin  Likely demand ischemia Cardiology has seen patient, low preop risk without need for inpatient testing.  Echocardiogram showed EF 60 to 65%, LV with normal function, no regional wall motion abnormality, grade 1 diastolic dysfunction    Plan    Cholecystectomy planned today At this time patient shows no signs of pancreatitis or complications from ERCP Will plan to sign off, please reconsult if needed this hospital visit Plan for outpatient follow-up with Dr. Arelia Longest or APP 6 weeks.    LOS: 2 days   Vladimir Crofts  12/11/2021, 9:09 AM

## 2021-12-11 NOTE — Anesthesia Preprocedure Evaluation (Addendum)
Anesthesia Evaluation  Patient identified by MRN, date of birth, ID band Patient awake    Reviewed: Allergy & Precautions, NPO status , Patient's Chart, lab work & pertinent test results, reviewed documented beta blocker date and time   History of Anesthesia Complications Negative for: history of anesthetic complications  Airway Mallampati: II  TM Distance: >3 FB Neck ROM: Full    Dental  (+) Missing,    Pulmonary asthma , former smoker,    Pulmonary exam normal        Cardiovascular hypertension, Pt. on home beta blockers and Pt. on medications Normal cardiovascular exam     Neuro/Psych  Headaches, CVA (dizziness), Residual Symptoms negative psych ROS   GI/Hepatic Neg liver ROS, GERD  Medicated and Controlled,cholecystitis   Endo/Other  Hypothyroidism   Renal/GU Renal InsufficiencyRenal disease  negative genitourinary   Musculoskeletal  (+) Arthritis ,   Abdominal   Peds  Hematology  (+) anemia , Hgb 10.8   Anesthesia Other Findings Day of surgery medications reviewed with patient.  Reproductive/Obstetrics negative OB ROS                           Anesthesia Physical Anesthesia Plan  ASA: 2 and emergent  Anesthesia Plan: General   Post-op Pain Management: Tylenol PO (pre-op)   Induction: Intravenous  PONV Risk Score and Plan: 3 and Treatment may vary due to age or medical condition, Ondansetron and Dexamethasone  Airway Management Planned: Oral ETT  Additional Equipment: None  Intra-op Plan:   Post-operative Plan: Extubation in OR  Informed Consent: I have reviewed the patients History and Physical, chart, labs and discussed the procedure including the risks, benefits and alternatives for the proposed anesthesia with the patient or authorized representative who has indicated his/her understanding and acceptance.     Dental advisory given  Plan Discussed with:  CRNA  Anesthesia Plan Comments:        Anesthesia Quick Evaluation

## 2021-12-11 NOTE — Progress Notes (Signed)
PROGRESS NOTE    Alexis Shelton  [id number] DOB: [birth year] DOA: 12/08/2021 PCP: de [place], Raymond J, MD     Brief Narrative:  Alexis Shelton is a 80 y.o. female with medical history significant of osteoarthritis, lumbar DDD, unspecified dizziness, gallstones, hearing loss, cerebellar CVA, hypertension, hyperlipidemia, hypothyroid Lif[place], gout, migraine headaches, mild intermittent asthma, osteoarthritis, history of rheumatoid arthritis, stage III CKD, vitamin D deficiency who is coming to the emergency department due to abdominal pain that she has had on/off for about 2 years.  However, the frequency of this pain has increased over the last few months, more so in the last few weeks.  Yesterday afternoon, the patient stated that her pain got very intense.  She had 3 episodes of emesis after that.  This morning she was unable to tolerate the pain and called EMS.  CT abdomen/pelvis with contrast showed choledocholithiasis with distended gallbladder and CBD. GI, general surgery, cardiology were consulted.  She underwent ERCP on 12/17, biliary sphincterectomy and balloon sweeping.   New events last 24 hours / Subjective: States that yesterday, she noticed a lot of itching on her skin especially on her legs.  She refused Cipro and metoprolol because she thought that was contributing.  Metoprolol XL prior to admission medication and patient had tolerated Cipro in the past.  She states that she got IV Cipro early this morning, no reactions to it today.  No worsening abdominal pain.  Assessment & Plan:   Principal Problem:   Cholangitis Active Problems:   Essential hypertension   Hyperlipidemia   Hypothyroidism   Stage 3 chronic kidney disease (HCC)   Mild intermittent asthma without complication   Prediabetes   Choledocholithiasis   Elevated liver function tests   Ascending cholangitis with elevated LFTs  -Status post ERCP 12/17, biliary sphincterectomy and balloon  sweeping -General surgery, planning for cholecystectomy today 12/19 -Continue Cipro -Trend LFTs, trended downward today  Essential hypertension -Continue metoprolol, will add Norvasc due to uncontrolled blood pressure  Hypothyroidism -Continue Synthroid  CKD stage IIIb -Baseline Cr 1.3  -Stable  Prediabetes -SSI   Demand ischemia with elevated troponin -Troponin 32, 34 -Echocardiogram showed EF 60 to 65%, LV with normal function, no regional wall motion abnormality, grade 1 diastolic dysfunction.  I discussed over the phone with Dr. Sallyanne Kuster.  He agrees with Dr. Oralia Rud preop risk assessment, no need for inpatient cardiac testing prior to lap cholecystectomy   DVT prophylaxis:  SCDs Start: 12/08/21 1404  Code Status: Full code Family Communication: No family at bedside Disposition Plan:  Status is: Inpatient  Remains inpatient appropriate because: OR today for cholecystectomy    Consultants:  GI General surgery Cardiology  Procedures:  ERCP 12/17  Antimicrobials:  Anti-infectives (From admission, onward)    Start     Dose/Rate Route Frequency Ordered Stop   12/08/21 1600  [MAR Hold]  ciprofloxacin (CIPRO) IVPB 400 mg        (MAR Hold since Mon 12/11/2021 at 0934.Hold Reason: Transfer to a Procedural area)   400 mg 200 mL/hr over 60 Minutes Intravenous Every 12 hours 12/08/21 1459     12/08/21 1530  ciprofloxacin (CIPRO) IVPB 400 mg  Status:  Discontinued        400 mg 200 mL/hr over 60 Minutes Intravenous Every 24 hours 12/08/21 1431 12/08/21 1459        Objective: Vitals:   12/11/21 0524 12/11/21 0948 12/11/21 1149 12/11/21 1155  BP: (!) 191/72 (!) 187/73 (!) 211/75 (!) 194/69  Pulse: 61 (!) 52 65 60  Resp: 14 16 (!) 22 15  Temp: 98.2 F (36.8 C) 98.5 F (36.9 C) 98.4 F (36.9 C)   TempSrc: Oral Oral    SpO2: 100% 92% 97% 100%  Weight:      Height:        Intake/Output Summary (Last 24 hours) at 12/11/2021 1157 Last data filed at  12/11/2021 1127 Gross per 24 hour  Intake 3367.81 ml  Output 20 ml  Net 3347.81 ml    Filed Weights   12/08/21 0606 12/09/21 1822  Weight: 79.4 kg 86 kg    Examination:  General exam: Appears calm and comfortable  Respiratory system: Clear to auscultation. Respiratory effort normal. No respiratory distress. No conversational dyspnea.  Cardiovascular system: S1 & S2 heard, RRR. No murmurs. No pedal edema. Gastrointestinal system: Abdomen is nondistended, soft and nontender. Normal bowel sounds heard. Central nervous system: Alert and oriented. No focal neurological deficits. Speech clear.  Extremities: Symmetric in appearance  Skin: No rashes, lesions or ulcers on exposed skin  Psychiatry: Judgement and insight appear normal. Mood & affect appropriate.   Data Reviewed: I have personally reviewed following labs and imaging studies  CBC: Recent Labs  Lab 12/08/21 0627 12/08/21 1226 12/09/21 1132 12/10/21 0554  WBC 8.7  --  8.5 8.8  NEUTROABS 7.6  --   --   --   HGB 13.8 12.6 11.8* 10.8*  HCT 44.7 37.0 37.9 34.7*  MCV 93.1  --  94.0 94.0  PLT 249  --  188 [id number]    Basic Metabolic Panel: Recent Labs  Lab 12/08/21 0627 12/08/21 1226 12/09/21 1132 12/10/21 0554 12/11/21 0514  NA 141 140 135 139 140  K 4.3 4.7 4.4 4.4 4.4  CL 104  --  106 107 112*  CO2 26  --  23 24 22   GLUCOSE 151*  --  109* 136* 94  BUN 26*  --  21 24* 24*  CREATININE 1.32*  --  1.44* 1.50* 1.20*  CALCIUM 9.4  --  8.0* 8.1* 8.4*    GFR: Estimated Creatinine Clearance: 39.9 mL/min (A) (by C-G formula based on SCr of 1.2 mg/dL (H)). Liver Function Tests: Recent Labs  Lab 12/08/21 0627 12/09/21 1132 12/10/21 0554 12/11/21 0514  AST 678* 332* 208* 122*  ALT 353* 390* 300* 241*  ALKPHOS 155* 142* 128* 134*  BILITOT 1.6* 5.7* 1.5* 1.1  PROT 7.2 6.2* 5.7* 5.9*  ALBUMIN 4.0 3.1* 2.7* 2.9*    Recent Labs  Lab 12/08/21 0627 12/09/21 1132  LIPASE 103* 78*    No results for input(s):  AMMONIA in the last 168 hours. Coagulation Profile: Recent Labs  Lab 12/08/21 1443  INR 1.1    Cardiac Enzymes: No results for input(s): CKTOTAL, CKMB, CKMBINDEX, TROPONINI in the last 168 hours. BNP (last 3 results) No results for input(s): PROBNP in the last 8760 hours. HbA1C: No results for input(s): HGBA1C in the last 72 hours. CBG: Recent Labs  Lab 12/10/21 0803 12/10/21 1156 12/10/21 1645 12/10/21 2119 12/11/21 0738  GLUCAP 113* 94 91 109* 91    Lipid Profile: No results for input(s): CHOL, HDL, LDLCALC, TRIG, CHOLHDL, LDLDIRECT in the last 72 hours. Thyroid Function Tests: No results for input(s): TSH, T4TOTAL, FREET4, T3FREE, THYROIDAB in the last 72 hours. Anemia Panel: No results for input(s): VITAMINB12, FOLATE, FERRITIN, TIBC, IRON, RETICCTPCT in the last 72 hours. Sepsis Labs: No results for input(s): PROCALCITON, LATICACIDVEN in the last 168 hours.  Recent  Results (from the past 240 hour(s))  Resp Panel by RT-PCR (Flu A&B, Covid) Nasopharyngeal Swab     Status: None   Collection Time: 12/08/21  8:30 AM   Specimen: Nasopharyngeal Swab; Nasopharyngeal(NP) swabs in vial transport medium  Result Value Ref Range Status   SARS Coronavirus 2 by RT PCR NEGATIVE NEGATIVE Final    Comment: (NOTE) SARS-CoV-2 target nucleic acids are NOT DETECTED.  The SARS-CoV-2 RNA is generally detectable in upper respiratory specimens during the acute phase of infection. The lowest concentration of SARS-CoV-2 viral copies this assay can detect is 138 copies/mL. A negative result does not preclude SARS-Cov-2 infection and should not be used as the sole basis for treatment or other patient management decisions. A negative result may occur with  improper specimen collection/handling, submission of specimen other than nasopharyngeal swab, presence of viral mutation(s) within the areas targeted by this assay, and inadequate number of viral copies(<138 copies/mL). A negative result  must be combined with clinical observations, patient history, and epidemiological information. The expected result is Negative.  Fact Sheet for Patients:  EntrepreneurPulse.com.au  Fact Sheet for Healthcare Providers:  IncredibleEmployment.be  This test is no t yet approved or cleared by the [place] FDA and  has been authorized for detection and/or diagnosis of SARS-CoV-2 by FDA under an Emergency Use Authorization (EUA). This EUA will remain  in effect (meaning this test can be used) for the duration of the COVID-19 declaration under Section 564(b)(1) of the Act, 21 U.S.C.section 360bbb-3(b)(1), unless the authorization is terminated  or revoked sooner.       Influenza A by PCR NEGATIVE NEGATIVE Final   Influenza B by PCR NEGATIVE NEGATIVE Final    Comment: (NOTE) The Xpert Xpress SARS-CoV-2/FLU/RSV plus assay is intended as an aid in the diagnosis of influenza from Nasopharyngeal swab specimens and should not be used as a sole basis for treatment. Nasal washings and aspirates are unacceptable for Xpert Xpress SARS-CoV-2/FLU/RSV testing.  Fact Sheet for Patients: EntrepreneurPulse.com.au  Fact Sheet for Healthcare Providers: IncredibleEmployment.be  This test is not yet approved or cleared by the [place] FDA and has been authorized for detection and/or diagnosis of SARS-CoV-2 by FDA under an Emergency Use Authorization (EUA). This EUA will remain in effect (meaning this test can be used) for the duration of the COVID-19 declaration under Section 564(b)(1) of the Act, 21 U.S.C. section 360bbb-3(b)(1), unless the authorization is terminated or revoked.  Performed at [facility], [address] [place],  [address]        Radiology Studies: ECHOCARDIOGRAM COMPLETE  Result Date: 12/09/2021    ECHOCARDIOGRAM REPORT   Patient Name:   NASTEHO URAM Fluellen Date of  Exam: 12/09/2021 Medical Rec #:  [id number]     Height:       66.0 in Accession #:    [id number]    Weight:       175.0 lb Date of Birth:  [birth year]      BSA:          1.889 m Patient Age:    70 years      BP:           138/69 mmHg Patient Gender: F             HR:           62 bpm. Exam Location:  Inpatient Procedure: 2D Echo, Color Doppler and Cardiac Doppler Indications:    Elevated Troponins  History:  Patient has prior history of Echocardiogram examinations, most                 recent 10/23/2018. Risk Factors:Hypertension and Dyslipidemia.  Sonographer:    Irving Burton Senior RDCS Referring Phys: [id number] DAVID MANUEL ORTIZ IMPRESSIONS  1. Left ventricular ejection fraction, by estimation, is 60 to 65%. The left ventricle has normal function. The left ventricle has no regional wall motion abnormalities. Left ventricular diastolic parameters are consistent with Grade I diastolic dysfunction (impaired relaxation).  2. Right ventricular systolic function was not well visualized. The right ventricular size is not well visualized. Tricuspid regurgitation signal is inadequate for assessing PA pressure.  3. The mitral valve is normal in structure. No evidence of mitral valve regurgitation. No evidence of mitral stenosis.  4. The aortic valve is normal in structure. Aortic valve regurgitation is not visualized. No aortic stenosis is present.  5. The inferior vena cava is normal in size with greater than 50% respiratory variability, suggesting right atrial pressure of 3 mmHg. FINDINGS  Left Ventricle: Left ventricular ejection fraction, by estimation, is 60 to 65%. The left ventricle has normal function. The left ventricle has no regional wall motion abnormalities. The left ventricular internal cavity size was normal in size. There is  no left ventricular hypertrophy. Left ventricular diastolic parameters are consistent with Grade I diastolic dysfunction (impaired relaxation). Indeterminate filling pressures. Right  Ventricle: The right ventricular size is not well visualized. No increase in right ventricular wall thickness. Right ventricular systolic function was not well visualized. Tricuspid regurgitation signal is inadequate for assessing PA pressure. Left Atrium: Left atrial size was normal in size. Right Atrium: Right atrial size was not assessed. Pericardium: There is no evidence of pericardial effusion. Mitral Valve: The mitral valve is normal in structure. No evidence of mitral valve regurgitation. No evidence of mitral valve stenosis. Tricuspid Valve: The tricuspid valve is not well visualized. Tricuspid valve regurgitation is not demonstrated. No evidence of tricuspid stenosis. Aortic Valve: The aortic valve is normal in structure. Aortic valve regurgitation is not visualized. No aortic stenosis is present. Pulmonic Valve: The pulmonic valve was normal in structure. Pulmonic valve regurgitation is not visualized. No evidence of pulmonic stenosis. Aorta: The aortic root is normal in size and structure. Venous: The inferior vena cava is normal in size with greater than 50% respiratory variability, suggesting right atrial pressure of 3 mmHg. IAS/Shunts: The interatrial septum was not well visualized.  LEFT VENTRICLE PLAX 2D LVIDd:         4.50 cm   Diastology LVIDs:         2.60 cm   LV e' medial:    5.00 cm/s LV PW:         1.00 cm   LV E/e' medial:  13.4 LV IVS:        1.10 cm   LV e' lateral:   6.42 cm/s LVOT diam:     2.20 cm   LV E/e' lateral: 10.4 LV SV:         77 LV SV Index:   41 LVOT Area:     3.80 cm  RIGHT VENTRICLE RV S prime:     9.68 cm/s TAPSE (M-mode): 1.8 cm LEFT ATRIUM             Index        RIGHT ATRIUM           Index LA diam:        4.30 cm 2.28 cm/m  RA Area:     14.20 cm LA Vol (A2C):   50.2 ml 26.57 ml/m  RA Volume:   35.60 ml  18.84 ml/m LA Vol (A4C):   57.6 ml 30.48 ml/m LA Biplane Vol: 58.4 ml 30.91 ml/m  AORTIC VALVE LVOT Vmax:   73.10 cm/s LVOT Vmean:  53.700 cm/s LVOT VTI:     0.203 m  AORTA Ao Root diam: 3.10 cm Ao Asc diam:  3.40 cm MITRAL VALVE MV Area (PHT): 2.53 cm    SHUNTS MV Decel Time: 300 msec    Systemic VTI:  0.20 m MV E velocity: 66.80 cm/s  Systemic Diam: 2.20 cm MV A velocity: 84.40 cm/s MV E/A ratio:  0.79 Mihai Croitoru MD Electronically signed by Sanda Klein MD Signature Date/Time: 12/09/2021/2:12:06 PM    Final       Scheduled Meds:  (feeding supplement) PROSource Plus  30 mL Oral BID BM   acetaminophen  1,000 mg Oral Once   [MAR Hold] allopurinol  100 mg Oral Daily   feeding supplement  1 Container Oral BID BM   fentaNYL       [MAR Hold] indomethacin  100 mg Rectal Once   [MAR Hold] insulin aspart  0-9 Units Subcutaneous TID WC   [MAR Hold] levothyroxine  88 mcg Oral Q0600   [MAR Hold] metoprolol tartrate  25 mg Oral BID   [MAR Hold] mometasone-formoterol  2 puff Inhalation BID   [MAR Hold] montelukast  10 mg Oral QHS   multivitamin with minerals  1 tablet Oral Daily   Continuous Infusions:  sodium chloride 100 mL/hr at 12/11/21 0600   [MAR Hold] ciprofloxacin 400 mg (12/11/21 0537)   lactated ringers 100 mL/hr at 12/11/21 1027     LOS: 2 days      Time spent: 20 minutes   Dessa Phi, DO [facility] 12/11/2021, 11:57 AM   Available via Epic secure chat 7am-7pm After these hours, please refer to coverage provider listed on [email or web address]

## 2021-12-11 NOTE — Op Note (Signed)
Laparoscopic Cholecystectomy  Indications: This patient presents with gallstones and choledocholithiasis and will undergo laparoscopic cholecystectomy.  Pre-operative Diagnosis: gallstones  Post-operative Diagnosis: Same  Surgeon: Almond Lint   Assistants: Trixie Deis, PA-C  Anesthesia: General endotracheal anesthesia and local  ASA Class: 2 E  Procedure Details  The patient was seen again in the Holding Room. The risks, benefits, complications, treatment options, and expected outcomes were discussed with the patient. The possibilities of  bleeding, recurrent infection, damage to nearby structures, the need for additional procedures, failure to diagnose a condition, the possible need to convert to an open procedure, and creating a complication requiring transfusion or operation were discussed with the patient. The likelihood of improving the patient's symptoms with return to their baseline status is good.    The patient and/or family concurred with the proposed plan, giving informed consent. The site of surgery properly noted. The patient was taken to Operating Room, and the procedure verified as Laparoscopic Cholecystectomy with Intraoperative Cholangiogram. A Time Out was held and the above information confirmed.  Prior to the induction of general anesthesia, antibiotic prophylaxis was administered. General endotracheal anesthesia was then administered and tolerated well. After the induction, the abdomen was prepped with Chloraprep and draped in the sterile fashion. The patient was positioned in the supine position.  Local anesthetic agent was injected into the skin near the umbilicus and a vertical supraumbilical incision was made with a #11 blade. I dissected down to the abdominal fascia with blunt dissection.  The fascia was incised vertically and we entered the peritoneal cavity bluntly.  A pursestring suture of 0-Vicryl was placed around the fascial opening.  The Hasson cannula was  inserted and secured with the stay suture.  Pneumoperitoneum was then created with CO2 and tolerated well without any adverse changes in the patient's vital signs. An 11-mm port was placed in the subxiphoid position.  Two 5-mm ports were placed in the right upper quadrant. All skin incisions were infiltrated with a local anesthetic agent before making the incision and placing the trocars.   We positioned the patient in reverse Trendelenburg, tilted slightly to the patient's left.  The gallbladder was identified, the fundus grasped and retracted cephalad. Adhesions were lysed bluntly and with the electrocautery where indicated, taking care not to injure any adjacent organs or viscus. The infundibulum was grasped and retracted laterally, exposing the peritoneum overlying the triangle of Calot. This was then divided and exposed in a blunt fashion. The cautery was used to expose the infundibulum.  The cystic duct and cystic artery were skeletonized bluntly with a maryland and a right angle.  A critical view of the cystic duct and cystic artery was obtained.    The cystic duct was clipped high on the gallbladder side and then was triply clipped on the patient side.  The cystic artery was doubly clipped on the patient side and singly clipped on the specimen side.  These were both divided with the scissors.    The gallbladder was dissected from the liver bed in retrograde fashion with the electrocautery. The gallbladder was removed and placed in an Endocatch bag.  The gallbladder and Endocatch bag were then removed through the umbilical port site.  The liver bed was irrigated and inspected. Hemostasis was achieved with the electrocautery. SNOW hemostatic agent was placed in the gallbladder fossa.  Copious irrigation was utilized and was repeatedly aspirated until clear.    Pneumoperitoneum was released as we removed the trocars.   The pursestring suture was used  to close the umbilical fascia.  4-0 Monocryl was used  to close the skin.   The skin was cleaned and dry, and Dermabond was applied. The patient was then extubated and brought to the recovery room in stable condition. Instrument, sponge, and needle counts were correct at closure and at the conclusion of the case.   Findings: Mild acute inflammation.  No spillage of stones  Estimated Blood Loss: min         Drains: none          Specimens: Gallbladder to pathology       Complications: None; patient tolerated the procedure well.         Disposition: PACU - hemodynamically stable.         Condition: stable

## 2021-12-12 ENCOUNTER — Inpatient Hospital Stay (HOSPITAL_COMMUNITY): Payer: Medicare Other

## 2021-12-12 ENCOUNTER — Encounter (HOSPITAL_COMMUNITY): Payer: Self-pay | Admitting: General Surgery

## 2021-12-12 DIAGNOSIS — K8309 Other cholangitis: Secondary | ICD-10-CM | POA: Diagnosis not present

## 2021-12-12 LAB — GLUCOSE, CAPILLARY
Glucose-Capillary: 106 mg/dL — ABNORMAL HIGH (ref 70–99)
Glucose-Capillary: 100 mg/dL — ABNORMAL HIGH (ref 70–99)

## 2021-12-12 LAB — COMPREHENSIVE METABOLIC PANEL
ALT: 185 U/L — ABNORMAL HIGH (ref 0–44)
AST: 80 U/L — ABNORMAL HIGH (ref 15–41)
Albumin: 2.7 g/dL — ABNORMAL LOW (ref 3.5–5.0)
Alkaline Phosphatase: 121 U/L (ref 38–126)
Anion gap: 7 (ref 5–15)
BUN: 21 mg/dL (ref 8–23)
CO2: 26 mmol/L (ref 22–32)
Calcium: 8.5 mg/dL — ABNORMAL LOW (ref 8.9–10.3)
Chloride: 105 mmol/L (ref 98–111)
Creatinine, Ser: 1.16 mg/dL — ABNORMAL HIGH (ref 0.44–1.00)
GFR, Estimated: 47 mL/min — ABNORMAL LOW (ref >60)
Glucose, Bld: 125 mg/dL — ABNORMAL HIGH (ref 70–99)
Potassium: 5 mmol/L (ref 3.5–5.1)
Sodium: 138 mmol/L (ref 135–145)
Total Bilirubin: 1 mg/dL (ref 0.3–1.2)
Total Protein: 5.8 g/dL — ABNORMAL LOW (ref 6.5–8.1)

## 2021-12-12 LAB — SURGICAL PATHOLOGY

## 2021-12-12 MED ORDER — OXYCODONE HCL 5 MG PO TABS: 2.5000 mg | ORAL_TABLET | ORAL | 0 refills | Status: DC | PRN

## 2021-12-12 MED ORDER — METRONIDAZOLE 500 MG PO TABS: 500.0000 mg | ORAL_TABLET | Freq: Two times a day (BID) | ORAL | Status: DC

## 2021-12-12 MED ORDER — AMLODIPINE BESYLATE 5 MG PO TABS: 5.0000 mg | ORAL_TABLET | Freq: Every day | ORAL | 2 refills | Status: DC

## 2021-12-12 MED ORDER — CIPROFLOXACIN HCL 500 MG PO TABS: 500.0000 mg | ORAL_TABLET | Freq: Two times a day (BID) | ORAL | 0 refills | Status: AC

## 2021-12-12 MED ORDER — CIPROFLOXACIN HCL 500 MG PO TABS: 500.0000 mg | ORAL_TABLET | Freq: Two times a day (BID) | ORAL | Status: DC

## 2021-12-12 MED ORDER — METRONIDAZOLE 500 MG PO TABS: 500.0000 mg | ORAL_TABLET | Freq: Two times a day (BID) | ORAL | 0 refills | Status: AC

## 2021-12-12 NOTE — TOC Transition Note (Signed)
Transition of Care [facility]) - CM/SW Discharge Note   Patient Details  Name: Alexis Shelton MRN: [id number] Date of Birth: 04-14-1941  [facility]) CM/SW Contact:  Bartholome Bill, RN Phone Number: 12/12/2021, 2:52 PM   Clinical Narrative:      Pt with home 02 orders. Desat screen done by nursing staff. [employer] liaison contacted to deliver 02 to pt room.

## 2021-12-12 NOTE — Evaluation (Signed)
Physical Therapy Evaluation Patient Details Name: Alexis Shelton MRN: [id number] DOB: [birth year] Today's Date: 12/12/2021  History of Present Illness  80 yo female admitted with cholangitis s/p lap chole 12/11/21  Clinical Impression  On eval, pt was Min guard-Min A for mobility. She walked ~100 feet x 2 with cane vs dynamap for support. O2 95% on RA at rest, 87% on RA with ambulation. Dyspnea 2/4. Discussed d/c plan-pt plans to return home today where she lives alone. She stated she can have someone stay with her if necessary. Will recommend HHPT f/u for general strength and activity tolerance.        Recommendations for follow up therapy are one component of a multi-disciplinary discharge planning process, led by the attending physician.  Recommendations may be updated based on patient status, additional functional criteria and insurance authorization.  Follow Up Recommendations Home health PT (general strength/balance (if pt is agreeable))    Assistance Recommended at Discharge Frequent or constant Supervision/Assistance  Functional Status Assessment Patient has had a recent decline in their functional status and demonstrates the ability to make significant improvements in function in a reasonable and predictable amount of time.  Equipment Recommendations  None recommended by PT    Recommendations for Other Services       Precautions / Restrictions Precautions Precautions: Fall Precaution Comments: monitor O2 Restrictions Weight Bearing Restrictions: No      Mobility  Bed Mobility               General bed mobility comments: oob in recliner    Transfers Overall transfer level: Needs assistance Equipment used: Straight cane Transfers: Sit to/from Stand Sit to Stand: Min guard           General transfer comment: Min guard for safety. Unsteady.    Ambulation/Gait Ambulation/Gait assistance: Min guard;Min assist Gait Distance (Feet): 100 Feet Assistive  device: Straight cane Gait Pattern/deviations: Step-through pattern;Decreased stride length       General Gait Details: Intermittent assist to steady. Dyspnea 2/4. O2 87% on RA with ambulation  Stairs            Wheelchair Mobility    Modified Rankin (Stroke Patients Only)       Balance Overall balance assessment: Needs assistance         Standing balance support: Single extremity supported;During functional activity Standing balance-Leahy Scale: Fair                               Pertinent Vitals/Pain Pain Assessment: Faces Faces Pain Scale: Hurts little more Pain Location: surgical site Pain Descriptors / Indicators: Discomfort;Sore Pain Intervention(s): Limited activity within patient's tolerance;Monitored during session;Repositioned    Home Living Family/patient expects to be discharged to:: Private residence Living Arrangements: Alone Available Help at Discharge: Family;Available PRN/intermittently Type of Home: House Home Access: Stairs to enter Entrance Stairs-Rails: Doctor, general practice of Steps: 4   Home Layout: One level Home Equipment: Cane - single point      Prior Function Prior Level of Function : Independent/Modified Independent             Mobility Comments: uses cane in community       Hand Dominance        Extremity/Trunk Assessment   Upper Extremity Assessment Upper Extremity Assessment: Overall WFL for tasks assessed    Lower Extremity Assessment Lower Extremity Assessment: Generalized weakness    Cervical / Trunk Assessment Cervical / Trunk  Assessment: Normal  Communication   Communication: HOH  Cognition Arousal/Alertness: Awake/alert Behavior During Therapy: WFL for tasks assessed/performed Overall Cognitive Status: Within Functional Limits for tasks assessed                                          General Comments      Exercises     Assessment/Plan    PT  Assessment    PT Problem List         PT Treatment Interventions      PT Goals (Current goals can be found in the Care Plan section)  Acute Rehab PT Goals Patient Stated Goal: home today PT Goal Formulation: All assessment and education complete, DC therapy    Frequency     Barriers to discharge        Co-evaluation               AM-PAC PT "6 Clicks" Mobility  Outcome Measure Help needed turning from your back to your side while in a flat bed without using bedrails?: None Help needed moving from lying on your back to sitting on the side of a flat bed without using bedrails?: None Help needed moving to and from a bed to a chair (including a wheelchair)?: A Little Help needed standing up from a chair using your arms (e.g., wheelchair or bedside chair)?: A Little Help needed to walk in hospital room?: A Little Help needed climbing 3-5 steps with a railing? : A Little 6 Click Score: 20    End of Session Equipment Utilized During Treatment: Gait belt Activity Tolerance: Patient tolerated treatment well Patient left: in chair;with call bell/phone within reach   PT Visit Diagnosis: Unsteadiness on feet (R26.81);Difficulty in walking, not elsewhere classified (R26.2)    Time: 1610-9604 PT Time Calculation (min) (ACUTE ONLY): 13 min   Charges:   PT Evaluation $PT Eval Low Complexity: 1 Low            Faye Ramsay, PT Acute Rehabilitation  Office: [phone] Pager: [phone]

## 2021-12-12 NOTE — Anesthesia Postprocedure Evaluation (Signed)
Anesthesia Post Note  Patient: Alexis Shelton  Procedure(s) Performed: LAPAROSCOPIC CHOLECYSTECTOMY (Abdomen)     Patient location during evaluation: PACU Anesthesia Type: General Level of consciousness: awake and alert and oriented Pain management: pain level controlled Vital Signs Assessment: post-procedure vital signs reviewed and stable Respiratory status: spontaneous breathing, nonlabored ventilation and respiratory function stable Cardiovascular status: blood pressure returned to baseline Postop Assessment: no apparent nausea or vomiting Anesthetic complications: no        Shanda Howells

## 2021-12-12 NOTE — Discharge Summary (Signed)
Physician Discharge Summary  Alexis Shelton [id number] DOB: 06-09-1941 DOA: 12/08/2021  PCP: de [place], Raymond J, MD  Admit date: 12/08/2021 Discharge date: 12/12/2021  Admitted From: Home Disposition:  Home  Recommendations for Outpatient Follow-up:  Follow up with PCP in 1 week Follow up with General surgery as scheduled 01/02/22  Discharge Condition: Stable CODE STATUS: Full  Diet recommendation: Regular   Brief/Interim Summary: Alexis Shelton is a 80 y.o. female with medical history significant of osteoarthritis, lumbar DDD, unspecified dizziness, gallstones, hearing loss, cerebellar CVA, hypertension, hyperlipidemia, hypothyroid [place], gout, migraine headaches, mild intermittent asthma, osteoarthritis, history of rheumatoid arthritis, stage III CKD, vitamin D deficiency who is coming to the emergency department due to abdominal pain that she has had on/off for about 2 years.  However, the frequency of this pain has increased over the last few months, more so in the last few weeks.  Yesterday afternoon, the patient stated that her pain got very intense.  She had 3 episodes of emesis after that.  This morning she was unable to tolerate the pain and called EMS.  CT abdomen/pelvis with contrast showed choledocholithiasis with distended gallbladder and CBD. GI, general surgery, cardiology were consulted.  She underwent ERCP on 12/17, biliary sphincterectomy and balloon sweeping. She underwent cholecystomy on 12/19.   Discharge Diagnoses:  Principal Problem:   Cholangitis Active Problems:   Essential hypertension   Hyperlipidemia   Hypothyroidism   Stage 3 chronic kidney disease (HCC)   Mild intermittent asthma without complication   Prediabetes   Choledocholithiasis   Elevated liver function tests   Ascending cholangitis with elevated LFTs  -Status post ERCP 12/17, biliary sphincterectomy and balloon sweeping -Status post cholecystectomy 12/19 -Continue Cipro/flagyl  for total 5 day course from 12/17-12/21  -Trend LFTs, trended downward today  Essential hypertension -Continue metoprolol, will add Norvasc due to uncontrolled blood pressure   Hypothyroidism -Continue Synthroid  CKD stage IIIb -Baseline Cr 1.3  -Stable  Prediabetes -Diet controlled     Demand ischemia with elevated troponin -Troponin 32, 34 -Echocardiogram showed EF 60 to 65%, LV with normal function, no regional wall motion abnormality, grade 1 diastolic dysfunction.  I discussed over the phone with Dr. Sallyanne Kuster.  He agrees with Dr. Oralia Rud preop risk assessment, no need for inpatient cardiac testing prior to lap cholecystectomy  Discharge Instructions  Discharge Instructions     Call MD for:  difficulty breathing, headache or visual disturbances   Complete by: As directed    Call MD for:  extreme fatigue   Complete by: As directed    Call MD for:  hives   Complete by: As directed    Call MD for:  persistant dizziness or light-headedness   Complete by: As directed    Call MD for:  persistant nausea and vomiting   Complete by: As directed    Call MD for:  redness, tenderness, or signs of infection (pain, swelling, redness, odor or green/yellow discharge around incision site)   Complete by: As directed    Call MD for:  severe uncontrolled pain   Complete by: As directed    Call MD for:  temperature >100.4   Complete by: As directed    Discharge instructions   Complete by: As directed    You were cared for by a hospitalist during your hospital stay. If you have any questions about your discharge medications or the care you received while you were in the hospital after you are discharged, you can call the  unit and ask to speak with the hospitalist on call if the hospitalist that took care of you is not available. Once you are discharged, your primary care physician will handle any further medical issues. Please note that NO REFILLS for any discharge medications will be  authorized once you are discharged, as it is imperative that you return to your primary care physician (or establish a relationship with a primary care physician if you do not have one) for your aftercare needs so that they can reassess your need for medications and monitor your lab values.   Increase activity slowly   Complete by: As directed       Allergies as of 12/12/2021   No Known Allergies      Medication List     STOP taking these medications    betamethasone valerate ointment 0.1 % Commonly known as: VALISONE   ciclopirox 8 % solution Commonly known as: Penlac       TAKE these medications    albuterol 108 (90 Base) MCG/ACT inhaler Commonly known as: VENTOLIN HFA Inhale 2 puffs into the lungs every 6 (six) hours as needed for wheezing or shortness of breath.   allopurinol 100 MG tablet Commonly known as: ZYLOPRIM TAKE 1 TABLET BY MOUTH EVERY DAY   amLODipine 5 MG tablet Commonly known as: NORVASC Take 1 tablet (5 mg total) by mouth daily. Start taking on: December 13, 2021   aspirin 81 MG EC tablet Take 81 mg by mouth daily. Swallow whole.   budesonide-formoterol 160-4.5 MCG/ACT inhaler Commonly known as: Symbicort Inhale 2 puffs into the lungs as needed. What changed:  when to take this reasons to take this   ciprofloxacin 500 MG tablet Commonly known as: CIPRO Take 1 tablet (500 mg total) by mouth 2 (two) times daily for 1 day.   levothyroxine 88 MCG tablet Commonly known as: SYNTHROID TAKE 1 TABLET DAILY BEFORE BREAKFAST   metoprolol tartrate 25 MG tablet Commonly known as: LOPRESSOR TAKE 1 TABLET BY MOUTH TWICE A DAY   metroNIDAZOLE 500 MG tablet Commonly known as: FLAGYL Take 1 tablet (500 mg total) by mouth every 12 (twelve) hours for 1 day.   montelukast 10 MG tablet Commonly known as: SINGULAIR TAKE 1 TABLET BY MOUTH EVERYDAY AT BEDTIME   multivitamin tablet Take 1 tablet by mouth daily.   omeprazole 40 MG capsule Commonly  known as: PRILOSEC TAKE 1 CAPSULE (40 MG TOTAL) BY MOUTH DAILY 30 MINS BEFORE BREAKFAST What changed: See the new instructions.   oxyCODONE 5 MG immediate release tablet Commonly known as: Oxy IR/ROXICODONE Take 0.5-1 tablets (2.5-5 mg total) by mouth every 4 (four) hours as needed for moderate pain, severe pain or breakthrough pain.   traMADol 50 MG tablet Commonly known as: ULTRAM Take 1 tablet (50 mg total) by mouth every 6 (six) hours as needed. What changed: reasons to take this        Follow-up Information     [facility] Go on 01/02/2022.   Specialty: General Surgery Why: Your appointment is 01/02/22 at 9am Please arrive 30 minutes prior to your appointment to check in and fill out paperwork. Bring photo ID and insurance information. Contact information: [address] [place] [address] [phone]               No Known Allergies  Consultations: GI Gen surg    Procedures/Studies: CT ABDOMEN PELVIS W CONTRAST  Result Date: 12/08/2021 CLINICAL DATA:  Acute  and nonlocalized abdominal pain EXAM: CT ABDOMEN AND PELVIS WITH CONTRAST TECHNIQUE: Multidetector CT imaging of the abdomen and pelvis was performed using the standard protocol following bolus administration of intravenous contrast. CONTRAST:  23mL OMNIPAQUE IOHEXOL 300 MG/ML  SOLN COMPARISON:  03/25/2020 FINDINGS: Lower chest:  No contributory findings. Hepatobiliary: No focal liver abnormality.Gallstones. Physiologically distended gallbladder and mildly dilated common bile duct (9 mm) with avid mucosal based enhancement of both structures. There is a 1 cm rounded structure in the distal CBD that matches the density of gallstones. Pancreas: No acute finding or ductal dilatation. Spleen: Unremarkable. Adrenals/Urinary Tract: Negative adrenals. No hydronephrosis or stone. Symmetric atrophy and cortical lobulation. Unremarkable bladder. Stomach/Bowel: No  obstruction. No visible bowel inflammation. Distal colonic diverticula which are numerous. Vascular/Lymphatic: No acute vascular abnormality. Scattered atheromatous calcifications. No mass or adenopathy. Reproductive:Hysterectomy. Other: No ascites or pneumoperitoneum.  Small midline fatty hernia. Musculoskeletal: No acute abnormalities. Generalized thoracic and lumbar disc degeneration. Remote T12 compression fracture. IMPRESSION: Choledocholithiasis with distended gallbladder and common bile duct. Electronically Signed   By: Jorje Guild M.D.   On: 12/08/2021 07:58   DG CHEST PORT 1 VIEW  Result Date: 12/08/2021 CLINICAL DATA:  Hypoxia, hypertension, stage 3 chronic kidney disease EXAM: PORTABLE CHEST 1 VIEW COMPARISON:  None. FINDINGS: Lungs are clear. No pneumothorax or pleural effusion. Cardiac size is within normal limits. Central pulmonary arteries appear enlarged suggesting changes of pulmonary arterial hypertension. No acute bone abnormality. IMPRESSION: No radiographic evidence of acute cardiopulmonary disease. Central pulmonary arterial enlargement suggesting changes of pulmonary arterial hypertension. Electronically Signed   By: Fidela Salisbury M.D.   On: 12/08/2021 22:20   DG ERCP  Result Date: 12/09/2021 CLINICAL DATA:  Biliary stones. EXAM: ERCP TECHNIQUE: Multiple spot images obtained with the fluoroscopic device and submitted for interpretation post-procedure. FLUOROSCOPY TIME:  2 minutes, 17 seconds COMPARISON:  CT abdomen pelvis-12/08/2021 FINDINGS: Multiple fluoroscopic images of the right upper abdominal quadrant during ERCP are provided for review. Initial image demonstrates an ERCP probe overlying the right upper abdominal quadrant. Subsequent images demonstrate opacification of the common bile duct. There is a persistent nonocclusive filling defect within the distal aspect of the CBD compatible with choledocholithiasis demonstrated on preceding abdominal CT. Subsequent images  demonstrate insufflation of a balloon within the distal aspect of the CBD with subsequent biliary sweeping and presumed stone extraction and sphincterotomy. IMPRESSION: ERCP with findings of choledocholithiasis with subsequent biliary sweeping and presumed stone extraction. These images were submitted for radiologic interpretation only. Please see the procedural report for the amount of contrast and the fluoroscopy time utilized. Electronically Signed   By: Sandi Mariscal M.D.   On: 12/09/2021 09:31   ECHOCARDIOGRAM COMPLETE  Result Date: 12/09/2021    ECHOCARDIOGRAM REPORT   Patient Name:   Alexis Shelton Date of Exam: 12/09/2021 Medical Rec #:  [id number]     Height:       66.0 in Accession #:    [id number]    Weight:       175.0 lb Date of Birth:  1941-10-24      BSA:          1.889 m Patient Age:    68 years      BP:           138/69 mmHg Patient Gender: F             HR:           62 bpm. Exam Location:  Inpatient Procedure: 2D  Echo, Color Doppler and Cardiac Doppler Indications:    Elevated Troponins  History:        Patient has prior history of Echocardiogram examinations, most                 recent 10/23/2018. Risk Factors:Hypertension and Dyslipidemia.  Sonographer:    Raquel Sarna Senior RDCS Referring Phys: [id number] [place]  1. Left ventricular ejection fraction, by estimation, is 60 to 65%. The left ventricle has normal function. The left ventricle has no regional wall motion abnormalities. Left ventricular diastolic parameters are consistent with Grade I diastolic dysfunction (impaired relaxation).  2. Right ventricular systolic function was not well visualized. The right ventricular size is not well visualized. Tricuspid regurgitation signal is inadequate for assessing PA pressure.  3. The mitral valve is normal in structure. No evidence of mitral valve regurgitation. No evidence of mitral stenosis.  4. The aortic valve is normal in structure. Aortic valve regurgitation is not  visualized. No aortic stenosis is present.  5. The inferior vena cava is normal in size with greater than 50% respiratory variability, suggesting right atrial pressure of 3 mmHg. FINDINGS  Left Ventricle: Left ventricular ejection fraction, by estimation, is 60 to 65%. The left ventricle has normal function. The left ventricle has no regional wall motion abnormalities. The left ventricular internal cavity size was normal in size. There is  no left ventricular hypertrophy. Left ventricular diastolic parameters are consistent with Grade I diastolic dysfunction (impaired relaxation). Indeterminate filling pressures. Right Ventricle: The right ventricular size is not well visualized. No increase in right ventricular wall thickness. Right ventricular systolic function was not well visualized. Tricuspid regurgitation signal is inadequate for assessing PA pressure. Left Atrium: Left atrial size was normal in size. Right Atrium: Right atrial size was not assessed. Pericardium: There is no evidence of pericardial effusion. Mitral Valve: The mitral valve is normal in structure. No evidence of mitral valve regurgitation. No evidence of mitral valve stenosis. Tricuspid Valve: The tricuspid valve is not well visualized. Tricuspid valve regurgitation is not demonstrated. No evidence of tricuspid stenosis. Aortic Valve: The aortic valve is normal in structure. Aortic valve regurgitation is not visualized. No aortic stenosis is present. Pulmonic Valve: The pulmonic valve was normal in structure. Pulmonic valve regurgitation is not visualized. No evidence of pulmonic stenosis. Aorta: The aortic root is normal in size and structure. Venous: The inferior vena cava is normal in size with greater than 50% respiratory variability, suggesting right atrial pressure of 3 mmHg. IAS/Shunts: The interatrial septum was not well visualized.  LEFT VENTRICLE PLAX 2D LVIDd:         4.50 cm   Diastology LVIDs:         2.60 cm   LV e' medial:    5.00  cm/s LV PW:         1.00 cm   LV E/e' medial:  13.4 LV IVS:        1.10 cm   LV e' lateral:   6.42 cm/s LVOT diam:     2.20 cm   LV E/e' lateral: 10.4 LV SV:         77 LV SV Index:   41 LVOT Area:     3.80 cm  RIGHT VENTRICLE RV S prime:     9.68 cm/s TAPSE (M-mode): 1.8 cm LEFT ATRIUM             Index        RIGHT ATRIUM  Index LA diam:        4.30 cm 2.28 cm/m   RA Area:     14.20 cm LA Vol (A2C):   50.2 ml 26.57 ml/m  RA Volume:   35.60 ml  18.84 ml/m LA Vol (A4C):   57.6 ml 30.48 ml/m LA Biplane Vol: 58.4 ml 30.91 ml/m  AORTIC VALVE LVOT Vmax:   73.10 cm/s LVOT Vmean:  53.700 cm/s LVOT VTI:    0.203 m  AORTA Ao Root diam: 3.10 cm Ao Asc diam:  3.40 cm MITRAL VALVE MV Area (PHT): 2.53 cm    SHUNTS MV Decel Time: 300 msec    Systemic VTI:  0.20 m MV E velocity: 66.80 cm/s  Systemic Diam: 2.20 cm MV A velocity: 84.40 cm/s MV E/A ratio:  0.79 Mihai Croitoru MD Electronically signed by Sanda Klein MD Signature Date/Time: 12/09/2021/2:12:06 PM    Final        Discharge Exam: Vitals:   12/12/21 0841 12/12/21 1055  BP:    Pulse:    Resp:    Temp:    SpO2: 96% 90%    General: Pt is alert, awake, not in acute distress Cardiovascular: RRR, S1/S2 +, no edema Respiratory: CTA bilaterally, no wheezing, no rhonchi, no respiratory distress, no conversational dyspnea  Abdominal: Soft, NT, ND, bowel sounds + Extremities: no edema, no cyanosis Psych: Normal mood and affect, stable judgement and insight     The results of significant diagnostics from this hospitalization (including imaging, microbiology, ancillary and laboratory) are listed below for reference.     Microbiology: Recent Results (from the past 240 hour(s))  Resp Panel by RT-PCR (Flu A&B, Covid) Nasopharyngeal Swab     Status: None   Collection Time: 12/08/21  8:30 AM   Specimen: Nasopharyngeal Swab; Nasopharyngeal(NP) swabs in vial transport medium  Result Value Ref Range Status   SARS Coronavirus 2 by RT PCR  NEGATIVE NEGATIVE Final    Comment: (NOTE) SARS-CoV-2 target nucleic acids are NOT DETECTED.  The SARS-CoV-2 RNA is generally detectable in upper respiratory specimens during the acute phase of infection. The lowest concentration of SARS-CoV-2 viral copies this assay can detect is 138 copies/mL. A negative result does not preclude SARS-Cov-2 infection and should not be used as the sole basis for treatment or other patient management decisions. A negative result may occur with  improper specimen collection/handling, submission of specimen other than nasopharyngeal swab, presence of viral mutation(s) within the areas targeted by this assay, and inadequate number of viral copies(<138 copies/mL). A negative result must be combined with clinical observations, patient history, and epidemiological information. The expected result is Negative.  Fact Sheet for Patients:  EntrepreneurPulse.com.au  Fact Sheet for Healthcare Providers:  IncredibleEmployment.be  This test is no t yet approved or cleared by the [place] FDA and  has been authorized for detection and/or diagnosis of SARS-CoV-2 by FDA under an Emergency Use Authorization (EUA). This EUA will remain  in effect (meaning this test can be used) for the duration of the COVID-19 declaration under Section 564(b)(1) of the Act, 21 U.S.C.section 360bbb-3(b)(1), unless the authorization is terminated  or revoked sooner.       Influenza A by PCR NEGATIVE NEGATIVE Final   Influenza B by PCR NEGATIVE NEGATIVE Final    Comment: (NOTE) The Xpert Xpress SARS-CoV-2/FLU/RSV plus assay is intended as an aid in the diagnosis of influenza from Nasopharyngeal swab specimens and should not be used as a sole basis for treatment. Nasal washings and  aspirates are unacceptable for Xpert Xpress SARS-CoV-2/FLU/RSV testing.  Fact Sheet for Patients: EntrepreneurPulse.com.au  Fact Sheet for  Healthcare Providers: IncredibleEmployment.be  This test is not yet approved or cleared by the [place] FDA and has been authorized for detection and/or diagnosis of SARS-CoV-2 by FDA under an Emergency Use Authorization (EUA). This EUA will remain in effect (meaning this test can be used) for the duration of the COVID-19 declaration under Section 564(b)(1) of the Act, 21 U.S.C. section 360bbb-3(b)(1), unless the authorization is terminated or revoked.  Performed at [facility], [address] [place],  [address]      Labs: BNP (last 3 results) No results for input(s): BNP in the last 8760 hours. Basic Metabolic Panel: Recent Labs  Lab 12/08/21 0627 12/08/21 1226 12/09/21 1132 12/10/21 0554 12/11/21 0514 12/12/21 0554  NA 141 140 135 139 140 138  K 4.3 4.7 4.4 4.4 4.4 5.0  CL 104  --  106 107 112* 105  CO2 26  --  23 24 22 26   GLUCOSE 151*  --  109* 136* 94 125*  BUN 26*  --  21 24* 24* 21  CREATININE 1.32*  --  1.44* 1.50* 1.20* 1.16*  CALCIUM 9.4  --  8.0* 8.1* 8.4* 8.5*   Liver Function Tests: Recent Labs  Lab 12/08/21 0627 12/09/21 1132 12/10/21 0554 12/11/21 0514 12/12/21 0554  AST 678* 332* 208* 122* 80*  ALT 353* 390* 300* 241* 185*  ALKPHOS 155* 142* 128* 134* 121  BILITOT 1.6* 5.7* 1.5* 1.1 1.0  PROT 7.2 6.2* 5.7* 5.9* 5.8*  ALBUMIN 4.0 3.1* 2.7* 2.9* 2.7*   Recent Labs  Lab 12/08/21 0627 12/09/21 1132  LIPASE 103* 78*   No results for input(s): AMMONIA in the last 168 hours. CBC: Recent Labs  Lab 12/08/21 0627 12/08/21 1226 12/09/21 1132 12/10/21 0554  WBC 8.7  --  8.5 8.8  NEUTROABS 7.6  --   --   --   HGB 13.8 12.6 11.8* 10.8*  HCT 44.7 37.0 37.9 34.7*  MCV 93.1  --  94.0 94.0  PLT 249  --  188 180   Cardiac Enzymes: No results for input(s): CKTOTAL, CKMB, CKMBINDEX, TROPONINI in the last 168 hours. BNP: Invalid input(s): POCBNP CBG: Recent Labs  Lab 12/11/21 0738  12/11/21 1406 12/11/21 1725 12/11/21 2142 12/12/21 0721  GLUCAP 91 105* 123* 175* 106*   D-Dimer No results for input(s): DDIMER in the last 72 hours. Hgb A1c No results for input(s): HGBA1C in the last 72 hours. Lipid Profile No results for input(s): CHOL, HDL, LDLCALC, TRIG, CHOLHDL, LDLDIRECT in the last 72 hours. Thyroid function studies No results for input(s): TSH, T4TOTAL, T3FREE, THYROIDAB in the last 72 hours.  Invalid input(s): FREET3 Anemia work up No results for input(s): VITAMINB12, FOLATE, FERRITIN, TIBC, IRON, RETICCTPCT in the last 72 hours. Urinalysis    Component Value Date/Time   COLORURINE YELLOW 12/08/2021 1215   APPEARANCEUR CLEAR 12/08/2021 1215   LABSPEC >1.046 (H) 12/08/2021 1215   PHURINE 5.5 12/08/2021 1215   GLUCOSEU NEGATIVE 12/08/2021 1215   [place] 12/08/2021 1215   [place] 12/08/2021 1215   [place] 12/08/2021 1215   PROTEINUR 30 (A) 12/08/2021 1215   NITRITE NEGATIVE 12/08/2021 1215   LEUKOCYTESUR NEGATIVE 12/08/2021 1215   Sepsis Labs Invalid input(s): PROCALCITONIN,  WBC,  LACTICIDVEN Microbiology Recent Results (from the past 240 hour(s))  Resp Panel by RT-PCR (Flu A&B, Covid) Nasopharyngeal Swab     Status: None  Collection Time: 12/08/21  8:30 AM   Specimen: Nasopharyngeal Swab; Nasopharyngeal(NP) swabs in vial transport medium  Result Value Ref Range Status   SARS Coronavirus 2 by RT PCR NEGATIVE NEGATIVE Final    Comment: (NOTE) SARS-CoV-2 target nucleic acids are NOT DETECTED.  The SARS-CoV-2 RNA is generally detectable in upper respiratory specimens during the acute phase of infection. The lowest concentration of SARS-CoV-2 viral copies this assay can detect is 138 copies/mL. A negative result does not preclude SARS-Cov-2 infection and should not be used as the sole basis for treatment or other patient management decisions. A negative result may occur with  improper specimen  collection/handling, submission of specimen other than nasopharyngeal swab, presence of viral mutation(s) within the areas targeted by this assay, and inadequate number of viral copies(<138 copies/mL). A negative result must be combined with clinical observations, patient history, and epidemiological information. The expected result is Negative.  Fact Sheet for Patients:  EntrepreneurPulse.com.au  Fact Sheet for Healthcare Providers:  IncredibleEmployment.be  This test is no t yet approved or cleared by the [place] FDA and  has been authorized for detection and/or diagnosis of SARS-CoV-2 by FDA under an Emergency Use Authorization (EUA). This EUA will remain  in effect (meaning this test can be used) for the duration of the COVID-19 declaration under Section 564(b)(1) of the Act, 21 U.S.C.section 360bbb-3(b)(1), unless the authorization is terminated  or revoked sooner.       Influenza A by PCR NEGATIVE NEGATIVE Final   Influenza B by PCR NEGATIVE NEGATIVE Final    Comment: (NOTE) The Xpert Xpress SARS-CoV-2/FLU/RSV plus assay is intended as an aid in the diagnosis of influenza from Nasopharyngeal swab specimens and should not be used as a sole basis for treatment. Nasal washings and aspirates are unacceptable for Xpert Xpress SARS-CoV-2/FLU/RSV testing.  Fact Sheet for Patients: EntrepreneurPulse.com.au  Fact Sheet for Healthcare Providers: IncredibleEmployment.be  This test is not yet approved or cleared by the [place] FDA and has been authorized for detection and/or diagnosis of SARS-CoV-2 by FDA under an Emergency Use Authorization (EUA). This EUA will remain in effect (meaning this test can be used) for the duration of the COVID-19 declaration under Section 564(b)(1) of the Act, 21 U.S.C. section 360bbb-3(b)(1), unless the authorization is terminated or revoked.  Performed at [facility], [address] [place], [place] [address]      Patient was seen and examined on the day of discharge and was found to be in stable condition. Time coordinating discharge: 25 minutes including assessment and coordination of care, as well as examination of the patient.   SIGNED:  Dessa Phi, DO [facility] 12/12/2021, 11:49 AM

## 2021-12-12 NOTE — Progress Notes (Signed)
1 Day Post-Op   Subjective/Chief Complaint: Doing better.  Minimal pain after lap chole.  LFTs going down.     Objective: Vital signs in last 24 hours: Temp:  [97.5 F (36.4 C)-98.5 F (36.9 C)] 97.5 F (36.4 C) (12/20 0510) Pulse Rate:  [51-65] 51 (12/20 0510) Resp:  [15-22] 16 (12/20 0510) BP: (165-211)/(62-77) 165/62 (12/20 0510) SpO2:  [92 %-100 %] 96 % (12/20 0841) Last BM Date: 12/08/21  Intake/Output from previous day: 12/19 0701 - 12/20 0700 In: 1925.4 [P.O.:420; I.V.:700; IV Piggyback:645.4] Out: 120 [Urine:100; Blood:20] Intake/Output this shift: Total I/O In: -  Out: 400 [Urine:400]  Gen: A&O x 3, but a few repetitive questions.   GI: soft, some upper abdominal distention, incisions c/d/I.  Minimal tenderness at umbilical incision.    Lab Results:  Recent Labs    12/09/21 1132 12/10/21 0554  WBC 8.5 8.8  HGB 11.8* 10.8*  HCT 37.9 34.7*  PLT 188 180   BMET Recent Labs    12/11/21 0514 12/12/21 0554  NA 140 138  K 4.4 5.0  CL 112* 105  CO2 22 26  GLUCOSE 94 125*  BUN 24* 21  CREATININE 1.20* 1.16*  CALCIUM 8.4* 8.5*   PT/INR No results for input(s): LABPROT, INR in the last 72 hours.  ABG No results for input(s): PHART, HCO3 in the last 72 hours.  Invalid input(s): PCO2, PO2   Studies/Results: No results found.  Anti-infectives: Anti-infectives (From admission, onward)    Start     Dose/Rate Route Frequency Ordered Stop   12/11/21 1600  metroNIDAZOLE (FLAGYL) IVPB 500 mg        500 mg 100 mL/hr over 60 Minutes Intravenous Every 12 hours 12/11/21 1529     12/08/21 1600  ciprofloxacin (CIPRO) IVPB 400 mg        400 mg 200 mL/hr over 60 Minutes Intravenous Every 12 hours 12/08/21 1459     12/08/21 1530  ciprofloxacin (CIPRO) IVPB 400 mg  Status:  Discontinued        400 mg 200 mL/hr over 60 Minutes Intravenous Every 24 hours 12/08/21 1431 12/08/21 1459       Assessment/Plan: Choledocholithiasis - ERCP 12/17 with Dr Christella Hartigan with  stones and pus in duct POD 1 lap chole - Dr. Donell Beers (12/11/21) LFTs continue to come down. FEN - regular diet VTE - SCDs, okay for chemical prophylaxis from a general surgery standpoint- this does not need to be held for surgery ID - cipro  Elevated Tn - denies CP. Cardiology has already been consulted and await final recs HTN HLD Hypothyroidism CKD3  Prior CVA (2019)   OK for discharge from our standpoint.  Follow up and discharge instructions in chart.  5 tablets of oxycodone sent to pharmacy.  It appears she has a tramadol script already.   Antibiotics per GI. If cholangitis may need d/c on antibiotics.  From surgery standpoint, gallbladder only with minimal inflammation and no spillage of bile or stones.  No pus seen.   Maudry Diego, MD FACS Surgical Oncology, General Surgery, Trauma and Critical [facility], [place] [phone] for weekday/non holidays Check [email or web address] for coverage night/weekend/holidays  Do not use SecureChat as it is not reliable for timely patient care.

## 2021-12-12 NOTE — Progress Notes (Signed)
SATURATION QUALIFICATIONS:   Patient Saturations on Room Air at Rest = 90%  Patient Saturations on Room Air while Ambulating = 87%  Patient Saturations on 2 Liters of oxygen while Ambulating = 92%  Please briefly explain why patient needs home oxygen: On exertion, pt needs Oxygen to maintain normal limits.

## 2021-12-12 NOTE — Care Management Important Message (Signed)
Important Message  Patient Details IM Letter given to the Patient. Name: [name] MRN: [id number] Date of Birth: [birth year]   Medicare Important Message Given:  Yes     Caren Macadam 12/12/2021, 9:46 AM

## 2021-12-19 ENCOUNTER — Ambulatory Visit (HOSPITAL_BASED_OUTPATIENT_CLINIC_OR_DEPARTMENT_OTHER): Payer: Medicare Other | Admitting: Family Medicine

## 2021-12-21 ENCOUNTER — Ambulatory Visit (INDEPENDENT_AMBULATORY_CARE_PROVIDER_SITE_OTHER): Payer: Medicare Other | Admitting: Family Medicine

## 2021-12-21 ENCOUNTER — Other Ambulatory Visit: Payer: Self-pay

## 2021-12-21 ENCOUNTER — Encounter (HOSPITAL_BASED_OUTPATIENT_CLINIC_OR_DEPARTMENT_OTHER): Payer: Self-pay | Admitting: Family Medicine

## 2021-12-21 VITALS — BP 140/58 | HR 56 | Ht 66.0 in | Wt 177.0 lb

## 2021-12-21 DIAGNOSIS — E039 Hypothyroidism, unspecified: Secondary | ICD-10-CM | POA: Diagnosis not present

## 2021-12-21 DIAGNOSIS — Z9049 Acquired absence of other specified parts of digestive tract: Secondary | ICD-10-CM | POA: Insufficient documentation

## 2021-12-21 DIAGNOSIS — I10 Essential (primary) hypertension: Secondary | ICD-10-CM | POA: Diagnosis not present

## 2021-12-21 NOTE — Assessment & Plan Note (Signed)
Recent hospitalization for abdominal pain and patient found to have choledocholithiasis, subsequently had cholecystectomy during hospitalization Has been doing well since discharge, denies any abdominal pain.  Has had return of bowel movements, has had mild constipation.  Discussed general measures to help with this and discussed that it may take some time for bowel movements to return to normal pattern for her Encouraged to continue with follow-up with surgeon as arranged at discharge

## 2021-12-21 NOTE — Patient Instructions (Signed)
°  Medication Instructions:  You may consider using docusate (colace) to help with issues of constipation. This can be purchased over the counter --If you need a refill on any your medications before your next appointment, please call your pharmacy first. If no refills are authorized on file call the office.-- Lab Work: Your physician has recommended that you have lab work today: TSH If you have labs (blood work) drawn today and your tests are completely normal, you will receive your results via MyChart message OR a phone call from our staff.  Please ensure you check your voicemail in the event that you authorized detailed messages to be left on a delegated number. If you have any lab test that is abnormal or we need to change your treatment, we will call you to review the results.  Follow-Up: Your next appointment:   Your physician recommends that you schedule a follow-up appointment in: 3 MONTHS with Dr. de [place]  You will receive a text message or e-mail with a link to a survey about your care and experience with [place] today! We would greatly appreciate your feedback!   Thanks for letting us be apart of your health journey!!  Primary Care and Sports Medicine   Dr. Ceasar Mons [place]   We encourage you to activate your patient portal called "MyChart".  Sign up information is provided on this After Visit Summary.  MyChart is used to connect with patients for Virtual Visits (Telemedicine).  Patients are able to view lab/test results, encounter notes, upcoming appointments, etc.  Non-urgent messages can be sent to your provider as well. To learn more about what you can do with MyChart, please visit --  [email or web address].

## 2021-12-21 NOTE — Assessment & Plan Note (Signed)
Most recent TSH about 2 months ago was slightly low and dose of levothyroxine was decreased Will check TSH today in order to monitor therapy Will adjust dose of levothyroxine as indicated based on TSH level

## 2021-12-21 NOTE — Progress Notes (Signed)
° ° °  Procedures performed today:    None.  Independent interpretation of notes and tests performed by another provider:   None.  Brief History, Exam, Impression, and Recommendations:    BP (!) 140/58    Pulse (!) 56    Ht 5\' 6"  (1.676 m)    Wt 177 lb (80.3 kg)    SpO2 98%    BMI 28.57 kg/m   Essential hypertension Was started on amlodipine 5 mg at discharge from hospital Has been tolerating medication well Blood pressure at goal in office today Continue with current regimen, recommend DASH diet Continue to monitor blood pressure at follow-up visits  Hypothyroidism Most recent TSH about 2 months ago was slightly low and dose of levothyroxine was decreased Will check TSH today in order to monitor therapy Will adjust dose of levothyroxine as indicated based on TSH level  History of cholecystectomy Recent hospitalization for abdominal pain and patient found to have choledocholithiasis, subsequently had cholecystectomy during hospitalization Has been doing well since discharge, denies any abdominal pain.  Has had return of bowel movements, has had mild constipation.  Discussed general measures to help with this and discussed that it may take some time for bowel movements to return to normal pattern for her Encouraged to continue with follow-up with surgeon as arranged at discharge  Plan for follow-up in about 3 months   ___________________________________________ Alexis Haskins de , MD, ABFM, CAQSM Primary Care and Sports Medicine [facility]

## 2021-12-21 NOTE — Assessment & Plan Note (Signed)
Was started on amlodipine 5 mg at discharge from hospital Has been tolerating medication well Blood pressure at goal in office today Continue with current regimen, recommend DASH diet Continue to monitor blood pressure at follow-up visits

## 2021-12-22 LAB — TSH: TSH: 1.73 u[IU]/mL (ref 0.450–4.500)

## 2021-12-24 HISTORY — PX: TOOTH EXTRACTION: SUR596

## 2021-12-26 ENCOUNTER — Other Ambulatory Visit (HOSPITAL_BASED_OUTPATIENT_CLINIC_OR_DEPARTMENT_OTHER): Payer: Self-pay | Admitting: Nurse Practitioner

## 2021-12-26 DIAGNOSIS — E039 Hypothyroidism, unspecified: Secondary | ICD-10-CM

## 2021-12-27 ENCOUNTER — Other Ambulatory Visit: Payer: Self-pay | Admitting: Family Medicine

## 2022-01-09 ENCOUNTER — Ambulatory Visit (HOSPITAL_BASED_OUTPATIENT_CLINIC_OR_DEPARTMENT_OTHER): Payer: Medicare Other | Admitting: Family Medicine

## 2022-01-24 ENCOUNTER — Ambulatory Visit: Payer: Medicare Other | Admitting: Emergency Medicine

## 2022-01-26 ENCOUNTER — Other Ambulatory Visit (HOSPITAL_BASED_OUTPATIENT_CLINIC_OR_DEPARTMENT_OTHER): Payer: Self-pay | Admitting: Family Medicine

## 2022-02-01 ENCOUNTER — Other Ambulatory Visit (HOSPITAL_COMMUNITY): Payer: Self-pay

## 2022-02-01 MED ORDER — TRAMADOL HCL 50 MG PO TABS
50.0000 mg | ORAL_TABLET | Freq: Four times a day (QID) | ORAL | 0 refills | Status: DC | PRN
  Filled 2022-02-01: qty 90, 23d supply, fill #0

## 2022-02-05 ENCOUNTER — Other Ambulatory Visit (HOSPITAL_COMMUNITY): Payer: Self-pay

## 2022-02-08 ENCOUNTER — Ambulatory Visit (INDEPENDENT_AMBULATORY_CARE_PROVIDER_SITE_OTHER): Payer: Medicare Other | Admitting: Podiatry

## 2022-02-08 ENCOUNTER — Other Ambulatory Visit: Payer: Self-pay

## 2022-02-08 DIAGNOSIS — B351 Tinea unguium: Secondary | ICD-10-CM

## 2022-02-08 DIAGNOSIS — M79674 Pain in right toe(s): Secondary | ICD-10-CM

## 2022-02-08 DIAGNOSIS — M79675 Pain in left toe(s): Secondary | ICD-10-CM

## 2022-02-11 NOTE — Progress Notes (Signed)
Subjective: 81 year old female presents the office today for follow-up evaluation of symptomatic onychomycosis.  She states that her feet, when she noticed the yellow discoloration coming back to the toenails on her hallux.  She has not been using the Penlac.  No swelling or redness to the toenail sites and she has no other concerns today.   Objective: AAO x3, NAD DP/PT pulses palpable bilaterally, CRT less than 3 seconds Nails continue be hypertrophic, dystrophic with yellow-brown discoloration at the distal portion was notably the hallux.  There is tenderness nails 1-5 bilaterally as they are elongated.  No edema, erythema or signs of infection.  No open lesions. No pain with calf compression, swelling, warmth, erythema  Assessment: Symptomatic onychomycosis  Plan: -All treatment options discussed with the patient including all alternatives, risks, complications.  -Nails sharply debrided x10 without any complications or bleeding.  Recommend continue Penlac as its been helpful and seems to be growing out some although slowly. -Patient encouraged to call the office with any questions, concerns, change in symptoms.   Vivi BarrackMatthew R Loras Grieshop DPM

## 2022-02-21 ENCOUNTER — Encounter: Payer: Self-pay | Admitting: Internal Medicine

## 2022-02-21 ENCOUNTER — Other Ambulatory Visit: Payer: Self-pay

## 2022-02-21 ENCOUNTER — Ambulatory Visit (INDEPENDENT_AMBULATORY_CARE_PROVIDER_SITE_OTHER): Payer: Medicare Other | Admitting: Internal Medicine

## 2022-02-21 VITALS — BP 128/78 | HR 61 | Temp 98.3°F | Resp 16 | Ht 66.0 in | Wt 179.0 lb

## 2022-02-21 DIAGNOSIS — N1832 Chronic kidney disease, stage 3b: Secondary | ICD-10-CM

## 2022-02-21 DIAGNOSIS — Z23 Encounter for immunization: Secondary | ICD-10-CM | POA: Diagnosis not present

## 2022-02-21 DIAGNOSIS — I10 Essential (primary) hypertension: Secondary | ICD-10-CM | POA: Diagnosis not present

## 2022-02-21 DIAGNOSIS — R7303 Prediabetes: Secondary | ICD-10-CM

## 2022-02-21 DIAGNOSIS — D539 Nutritional anemia, unspecified: Secondary | ICD-10-CM | POA: Diagnosis not present

## 2022-02-21 DIAGNOSIS — R7989 Other specified abnormal findings of blood chemistry: Secondary | ICD-10-CM

## 2022-02-21 LAB — BASIC METABOLIC PANEL
BUN: 34 mg/dL — ABNORMAL HIGH (ref 6–23)
CO2: 28 mEq/L (ref 19–32)
Calcium: 9.5 mg/dL (ref 8.4–10.5)
Chloride: 105 mEq/L (ref 96–112)
Creatinine, Ser: 1.51 mg/dL — ABNORMAL HIGH (ref 0.40–1.20)
GFR: 32 mL/min — ABNORMAL LOW (ref >60.00)
Glucose, Bld: 107 mg/dL — ABNORMAL HIGH (ref 70–99)
Potassium: 4.8 mEq/L (ref 3.5–5.1)
Sodium: 140 mEq/L (ref 135–145)

## 2022-02-21 LAB — CBC WITH DIFFERENTIAL/PLATELET
Basophils Absolute: 0.1 10*3/uL (ref 0.0–0.1)
Basophils Relative: 0.8 % (ref 0.0–3.0)
Eosinophils Absolute: 0.3 10*3/uL (ref 0.0–0.7)
Eosinophils Relative: 4 % (ref 0.0–5.0)
HCT: 40.5 % (ref 36.0–46.0)
Hemoglobin: 13.1 g/dL (ref 12.0–15.0)
Lymphocytes Relative: 28.5 % (ref 12.0–46.0)
Lymphs Abs: 2 10*3/uL (ref 0.7–4.0)
MCHC: 32.4 g/dL (ref 30.0–36.0)
MCV: 91.3 fl (ref 78.0–100.0)
Monocytes Absolute: 0.6 10*3/uL (ref 0.1–1.0)
Monocytes Relative: 8.7 % (ref 3.0–12.0)
Neutro Abs: 4 10*3/uL (ref 1.4–7.7)
Neutrophils Relative %: 58 % (ref 43.0–77.0)
Platelets: 262 10*3/uL (ref 150.0–400.0)
RBC: 4.43 Mil/uL (ref 3.87–5.11)
RDW: 16.8 % — ABNORMAL HIGH (ref 11.5–15.5)
WBC: 6.9 10*3/uL (ref 4.0–10.5)

## 2022-02-21 LAB — HEPATIC FUNCTION PANEL
ALT: 19 U/L (ref 0–35)
AST: 22 U/L (ref 0–37)
Albumin: 3.9 g/dL (ref 3.5–5.2)
Alkaline Phosphatase: 72 U/L (ref 39–117)
Bilirubin, Direct: 0 mg/dL (ref 0.0–0.3)
Total Bilirubin: 0.3 mg/dL (ref 0.2–1.2)
Total Protein: 7 g/dL (ref 6.0–8.3)

## 2022-02-21 LAB — IBC + FERRITIN
Ferritin: 108.3 ng/mL (ref 10.0–291.0)
Iron: 65 ug/dL (ref 42–145)
Saturation Ratios: 17.1 % — ABNORMAL LOW (ref 20.0–50.0)
TIBC: 380.8 ug/dL (ref 250.0–450.0)
Transferrin: 272 mg/dL (ref 212.0–360.0)

## 2022-02-21 LAB — HEMOGLOBIN A1C: Hgb A1c MFr Bld: 6.2 % (ref 4.6–6.5)

## 2022-02-21 LAB — FOLATE: Folate: >24.2 ng/mL (ref >5.9)

## 2022-02-21 LAB — VITAMIN B12: Vitamin B-12: 520 pg/mL (ref 211–911)

## 2022-02-21 NOTE — Progress Notes (Signed)
? ?Subjective:  ?Patient ID: Alexis Shelton, female    DOB: 01-03-1941  Age: 81 y.o. MRN: [id number] ? ?CC: Anemia and Hypertension ? ?This visit occurred during the SARS-CoV-2 public health emergency.  Safety protocols were in place, including screening questions prior to the visit, additional usage of staff PPE, and extensive cleaning of exam room while observing appropriate contact time as indicated for disinfecting solutions.   ? ?HPI ?Alexis Shelton presents for f/up - ? ?She is s/p cholecystectomy for choledocholithiasis. Her labs revealed anemia and elevated LFT's. She feels much better and denies abd pain. ? 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  ? ?She has a past surgical history that includes Replacement total knee (Bilateral); Cataract extraction, bilateral; Abdominal hysterectomy; Breast reduction surgery (Bilateral); Tonsillectomy and adenoidectomy; Incision and drainage breast abscess (Left); Reduction mammaplasty; Endoscopic retrograde cholangiopancreatography (ercp) with propofol (N/A, 12/09/2021); sphincterotomy (12/09/2021); removal of stones (12/09/2021); and Cholecystectomy (N/A, 12/11/2021).  ? ?Her family history includes Arthritis in her mother and sister; Bipolar disorder in her son; GER disease in her maternal grandmother; Gallbladder disease in her maternal grandmother; Healthy in her son; Heart disease in her father and paternal uncle;  Hyperlipidemia in her mother; Stroke in her mother.She reports that she quit smoking about 53 years ago. Her smoking use included cigarettes. She has a 0.30 pack-year smoking history. She has never used smokeless tobacco. She reports that she does not currently use alcohol. She reports that she does not use drugs. ? ?Outpatient Medications Prior to Visit  ?Medication Sig Dispense Refill  ? albuterol (VENTOLIN HFA) 108 (90 Base) MCG/ACT inhaler Inhale 2 puffs into the lungs every 6 (six) hours as needed for wheezing or shortness of breath. 54 g 3  ? allopurinol (ZYLOPRIM) 100 MG tablet TAKE 1 TABLET BY MOUTH EVERY DAY 90 tablet 1  ? amLODipine (NORVASC) 5 MG tablet Take 1 tablet (5 mg total) by mouth daily. 30 tablet 2  ? aspirin 81 MG EC tablet Take 81 mg by mouth daily. Swallow whole.    ? budesonide-formoterol (SYMBICORT) 160-4.5 MCG/ACT inhaler Inhale 2 puffs into the lungs as needed. (Patient taking differently: Inhale 2 puffs into the lungs daily as needed (sob/wheezing).) 30.6 g 3  ? levothyroxine (SYNTHROID) 88 MCG tablet Take 1 tablet (88 mcg total) by mouth daily before breakfast. 30 tablet 2  ? metoprolol tartrate (LOPRESSOR) 25 MG tablet TAKE 1 TABLET BY MOUTH TWICE A DAY 180 tablet 2  ? montelukast (SINGULAIR) 10 MG tablet TAKE 1 TABLET BY MOUTH EVERYDAY AT BEDTIME 90 tablet 1  ? Multiple Vitamin (MULTIVITAMIN) tablet Take 1 tablet by mouth daily.    ? omeprazole (PRILOSEC) 40 MG capsule TAKE 1 CAPSULE (40 MG TOTAL) BY MOUTH DAILY 30 MINS BEFORE BREAKFAST (Patient taking differently: 40 mg daily as needed (indigestion).) 90 capsule 3  ? traMADol (ULTRAM) 50 MG tablet Take 1 tablet (50 mg total) by mouth every 6 (six) hours as needed. 90 tablet 0  ? ?No facility-administered medications prior to visit.  ? ? ?ROS ?Review of Systems  ?Constitutional:  Negative for chills, diaphoresis, fatigue and fever.  ?HENT: Negative.    ?  Eyes: Negative.   ?Respiratory: Negative.  Negative for cough, chest tightness,  shortness of breath and wheezing.   ?Cardiovascular:  Negative for chest pain, palpitations and leg swelling.  ?Gastrointestinal:  Negative for abdominal pain, constipation, diarrhea, nausea and vomiting.  ?Genitourinary: Negative.   ?Musculoskeletal: Negative.   ?Skin: Negative.   ?Neurological:  Negative for dizziness, weakness and light-headedness.  ?Hematological:  Negative for adenopathy. Does not bruise/bleed easily.  ?Psychiatric/Behavioral: Negative.    ? ?Objective:  ?BP 128/78 (BP Location: Right Arm, Patient Position: Sitting, Cuff Size: Large)   Pulse 61   Temp 98.3 ?F (36.8 ?C) (Oral)   Resp 16   Ht 5\' 6"  (1.676 m)   Wt 179 lb (81.2 kg)   SpO2 98%   BMI 28.89 kg/m?  ? ?Physical Exam ?Constitutional:   ?   Appearance: She is obese. She is not ill-appearing.  ?HENT:  ?   Mouth/Throat:  ?   Mouth: Mucous membranes are moist.  ?Eyes:  ?   General: No scleral icterus. ?   Conjunctiva/sclera: Conjunctivae normal.  ?Cardiovascular:  ?   Rate and Rhythm: Normal rate and regular rhythm.  ?   Heart sounds: No murmur heard. ?Pulmonary:  ?   Effort: Pulmonary effort is normal.  ?   Breath sounds: No stridor. No wheezing, rhonchi or rales.  ?Abdominal:  ?   General: Abdomen is protuberant. Bowel sounds are normal. There is no distension.  ?   Palpations: Abdomen is soft. There is no hepatomegaly, splenomegaly or mass.  ?   Tenderness: There is no abdominal tenderness.  ?Musculoskeletal:     ?   General: Normal range of motion.  ?   Cervical back: Neck supple.  ?   Right lower leg: No edema.  ?   Left lower leg: No edema.  ?Lymphadenopathy:  ?   Cervical: No cervical adenopathy.  ?Skin: ?   General: Skin is warm.  ?   Coloration: Skin is not pale.  ?   Findings: No rash.  ?Neurological:  ?   General: No focal deficit present.  ?Psychiatric:     ?   Mood and Affect: Mood normal.     ?   Behavior: Behavior normal.  ? ? ?Lab Results  ?Component Value Date  ? WBC 6.9 02/21/2022  ? HGB 13.1 02/21/2022  ? HCT 40.5  02/21/2022  ? PLT 262.0 02/21/2022  ? GLUCOSE 107 (H) 02/21/2022  ? CHOL 169 03/06/2021  ? TRIG 182 (H) 03/06/2021  ? HDL 38 (L) 03/06/2021  ? LDLCALC 101 (H) 03/06/2021  ? ALT 19 02/21/2022  ? AST 22 02/21/2022  ? NA 140 02/21/2022  ? K 4.8 02/21/2022  ? CL 105 02/21/2022  ? CREATININE 1.51 (H) 02/21/2022  ? BUN 34 (H) 02/21/2022  ? CO2 28 02/21/2022  ? TSH 1.730 12/21/2021  ? INR 1.1 12/08/2021  ? HGBA1C 6.2 02/21/2022  ?  ? ?Assessment & Plan:  ? ?Cigi was seen today for anemia and hypertension. ? ?Diagnoses and all orders for this visit: ? ?Prediabetes- Her A1C is 6.2%. ?-     Basic metabolic panel; Future ?-     Hemoglobin A1c; Future ?-     Hemoglobin A1c ?-     Basic metabolic panel ? ?Essential hypertension - Her BP is well controlled. ?-     CBC with Differential/Platelet; Future ?-     Basic metabolic panel; Future ?-     Basic metabolic panel ?-  CBC with Differential/Platelet ? ?Elevated liver function tests- LFT's are normal now. ?-     Hepatic function panel; Future ?-     Hepatic function panel ? ?Deficiency anemia- H/H and vitamin levels are normal now. ?-     Vitamin B12; Future ?-     IBC + Ferritin; Future ?-     Reticulocytes; Future ?-     Folate; Future ?-     Vitamin B1; Future ?-     Zinc; Future ?-     Zinc ?-     Vitamin B1 ?-     Folate ?-     Reticulocytes ?-     IBC + Ferritin ?-     Vitamin B12 ? ?Need for vaccination ?-     Pneumococcal conjugate vaccine 20-valent ? ?Stage 3b chronic kidney disease ([place])- She will avoid nephrotoxic agents. ? ? ?I am having Keyuana C. Fenstermaker maintain her aspirin, albuterol, multivitamin, budesonide-formoterol, metoprolol tartrate, omeprazole, amLODipine, levothyroxine, montelukast, allopurinol, and traMADol. ? ?No orders of the defined types were placed in this encounter. ? ? ? ?Follow-up: Return in about 3 months (around 05/24/2022). ? ?Scarlette Calico, MD ?

## 2022-02-21 NOTE — Patient Instructions (Signed)
Anemia ?Anemia is a condition in which there is not enough red blood cells or hemoglobin in the blood. Hemoglobin is a substance in red blood cells that carries oxygen. ?When you do not have enough red blood cells or hemoglobin (are anemic), your body cannot get enough oxygen and your organs may not work properly. As a result, you may feel very tired or have other problems. ?What are the causes? ?Common causes of anemia include: ?Excessive bleeding. Anemia can be caused by excessive bleeding inside or outside the body, including bleeding from the intestines or from heavy menstrual periods in females. ?Poor nutrition. ?Long-lasting (chronic) kidney, thyroid, and liver disease. ?Bone marrow disorders, spleen problems, and blood disorders. ?Cancer and treatments for cancer. ?HIV (human immunodeficiency virus) and AIDS (acquired immunodeficiency syndrome). ?Infections, medicines, and autoimmune disorders that destroy red blood cells. ?What are the signs or symptoms? ?Symptoms of this condition include: ?Minor weakness. ?Dizziness. ?Headache, or difficulties concentrating and sleeping. ?Heartbeats that feel irregular or faster than normal (palpitations). ?Shortness of breath, especially with exercise. ?Pale skin, lips, and nails, or cold hands and feet. ?Indigestion and nausea. ?Symptoms may occur suddenly or develop slowly. If your anemia is mild, you may not have symptoms. ?How is this diagnosed? ?This condition is diagnosed based on blood tests, your medical history, and a physical exam. In some cases, a test may be needed in which cells are removed from the soft tissue inside of a bone and looked at under a microscope (bone marrow biopsy). Your health care provider may also check your stool (feces) for blood and may do additional testing to look for the cause of your bleeding. ?Other tests may include: ?Imaging tests, such as a CT scan or MRI. ?A procedure to see inside your esophagus and stomach (endoscopy). ?A  procedure to see inside your colon and rectum (colonoscopy). ?How is this treated? ?Treatment for this condition depends on the cause. If you continue to lose a lot of blood, you may need to be treated at a hospital. Treatment may include: ?Taking supplements of iron, vitamin E09, or folic acid. ?Taking a hormone medicine (erythropoietin) that can help to stimulate red blood cell growth. ?Having a blood transfusion. This may be needed if you lose a lot of blood. ?Making changes to your diet. ?Having surgery to remove your spleen. ?Follow these instructions at home: ?Take over-the-counter and prescription medicines only as told by your health care provider. ?Take supplements only as told by your health care provider. ?Follow any diet instructions that you were given by your health care provider. ?Keep all follow-up visits as told by your health care provider. This is important. ?Contact a health care provider if: ?You develop new bleeding anywhere in the body. ?Get help right away if: ?You are very weak. ?You are short of breath. ?You have pain in your abdomen or chest. ?You are dizzy or feel faint. ?You have trouble concentrating. ?You have bloody stools, black stools, or tarry stools. ?You vomit repeatedly or you vomit up blood. ?These symptoms may represent a serious problem that is an emergency. Do not wait to see if the symptoms will go away. Get medical help right away. Call your local emergency services (911 in the [place] Do not drive yourself to the hospital. ?Summary ?Anemia is a condition in which you do not have enough red blood cells or enough of a substance in your red blood cells that carries oxygen (hemoglobin). ?Symptoms may occur suddenly or develop slowly. ?If your anemia is  mild, you may not have symptoms. ?This condition is diagnosed with blood tests, a medical history, and a physical exam. Other tests may be needed. ?Treatment for this condition depends on the cause of the anemia. ?This  information is not intended to replace advice given to you by your health care provider. Make sure you discuss any questions you have with your health care provider. ?Document Revised: 11/17/2019 Document Reviewed: 11/17/2019 ?Elsevier Patient Education ? 2022 [employer] ? ?

## 2022-02-25 LAB — ZINC: Zinc: 83 ug/dL (ref 60–130)

## 2022-02-25 LAB — RETICULOCYTES
ABS Retic: 82080 cells/uL — ABNORMAL HIGH (ref 20000–80000)
Retic Ct Pct: 1.8 %

## 2022-02-25 LAB — VITAMIN B1: Vitamin B1 (Thiamine): 35 nmol/L — ABNORMAL HIGH (ref 8–30)

## 2022-02-26 ENCOUNTER — Ambulatory Visit: Payer: Medicare Other | Admitting: Emergency Medicine

## 2022-02-27 DIAGNOSIS — I129 Hypertensive chronic kidney disease with stage 1 through stage 4 chronic kidney disease, or unspecified chronic kidney disease: Secondary | ICD-10-CM | POA: Diagnosis not present

## 2022-02-27 DIAGNOSIS — N390 Urinary tract infection, site not specified: Secondary | ICD-10-CM | POA: Diagnosis not present

## 2022-02-27 DIAGNOSIS — M1990 Unspecified osteoarthritis, unspecified site: Secondary | ICD-10-CM | POA: Diagnosis not present

## 2022-02-27 DIAGNOSIS — N1832 Chronic kidney disease, stage 3b: Secondary | ICD-10-CM | POA: Diagnosis not present

## 2022-03-08 ENCOUNTER — Other Ambulatory Visit: Payer: Self-pay | Admitting: Internal Medicine

## 2022-03-08 IMAGING — CT CT ABD-PELV W/ CM
2 of 5 series · 17 of 46 positions shown, 19 images · IV contrast (APPLIED)
Comparison: 03/25/2020

CLINICAL DATA: Acute and nonlocalized abdominal pain

EXAM:
CT ABDOMEN AND PELVIS WITH CONTRAST
TECHNIQUE: Multidetector CT imaging of the abdomen and pelvis was performed
using the standard protocol following bolus administration of
intravenous contrast.
CONTRAST:  80mL OMNIPAQUE IOHEXOL 300 MG/ML  SOLN

[Series 2: abd pel w · axial · 0.86mm/px · z∈[-494,-79]mm · 14 of 93 slices shown, 16 images]
[im 5/93  soft-tissue]
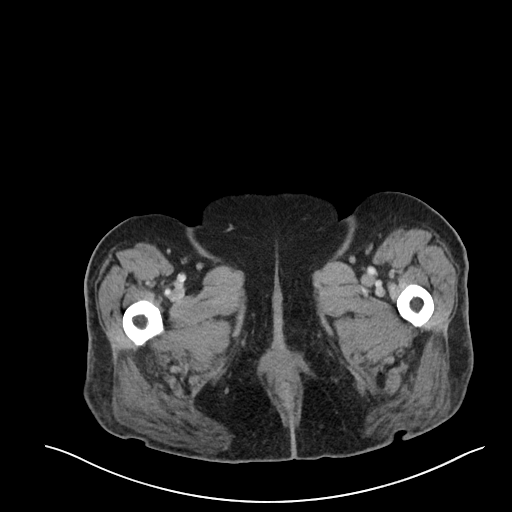
[im 5/93  bone]
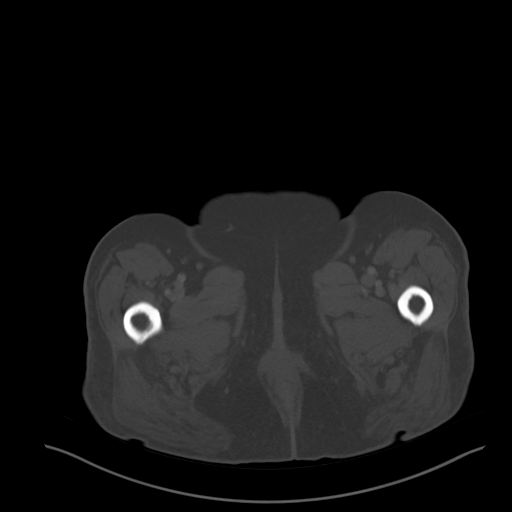
[im 10/93  soft-tissue]
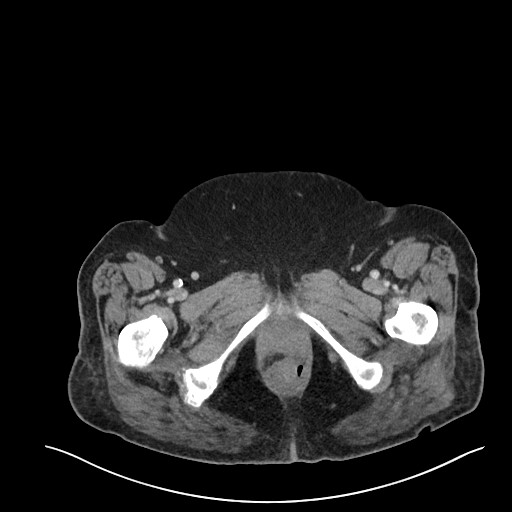
[im 20/93  soft-tissue]
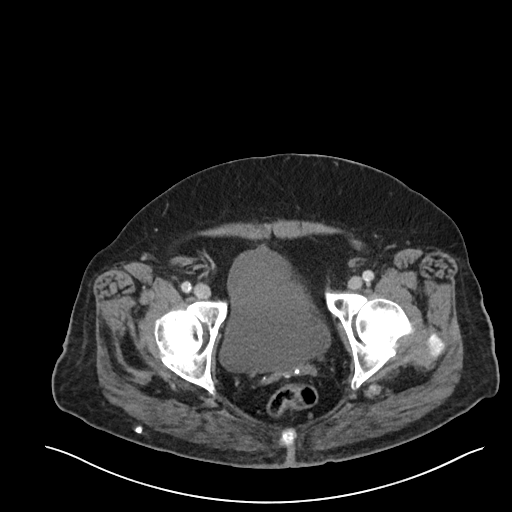
[im 25/93  soft-tissue]
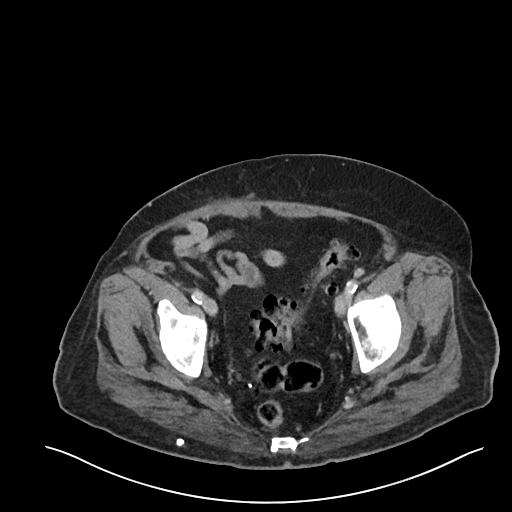
[im 30/93  soft-tissue]
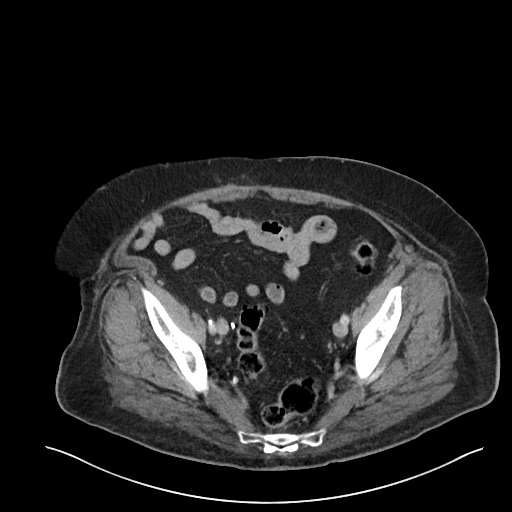
[im 39/93  soft-tissue]
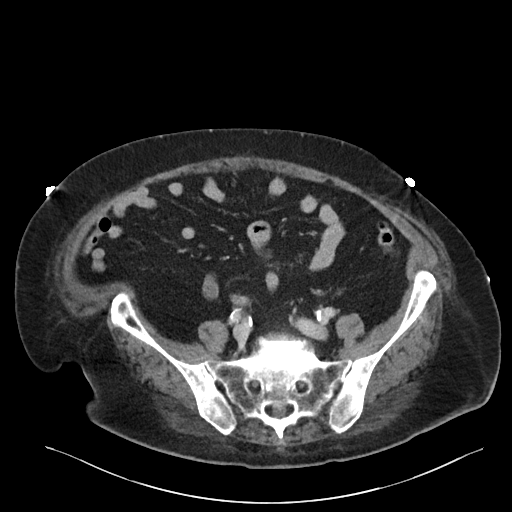
[im 44/93  soft-tissue]
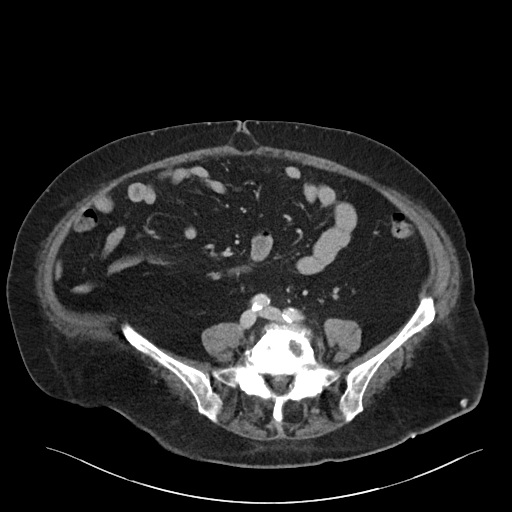
[im 49/93  soft-tissue]
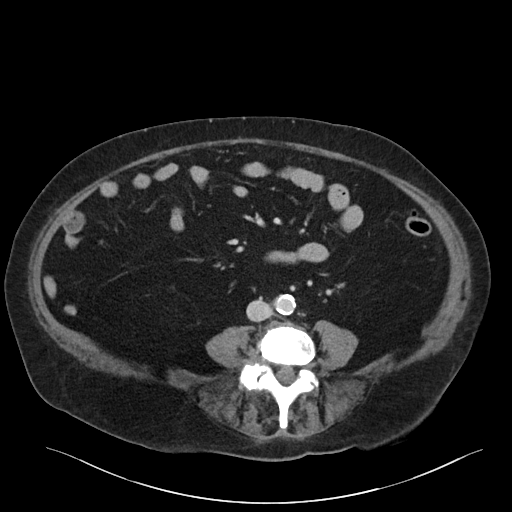
[im 54/93  soft-tissue]
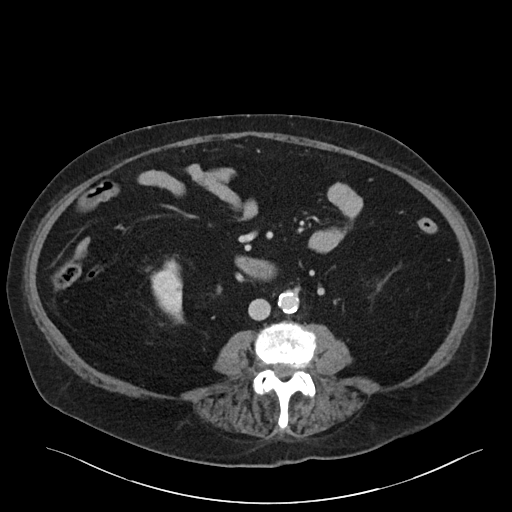
[im 54/93  bone]
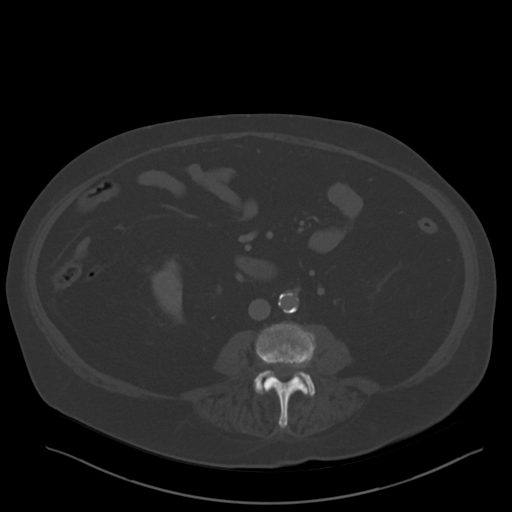
[im 63/93  soft-tissue]
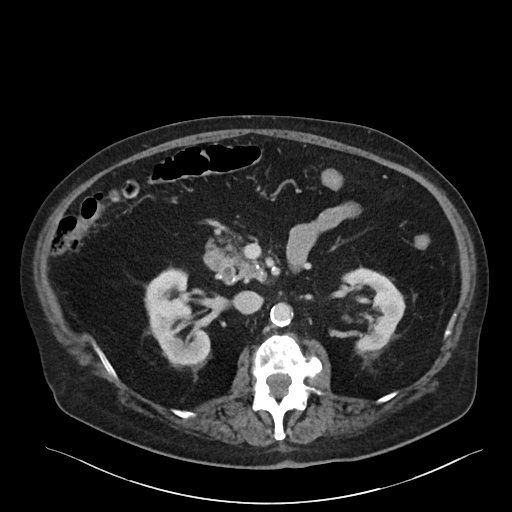
[im 68/93  soft-tissue]
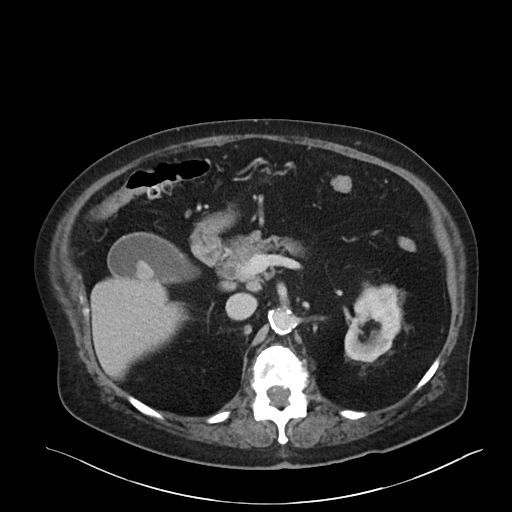
[im 73/93  soft-tissue]
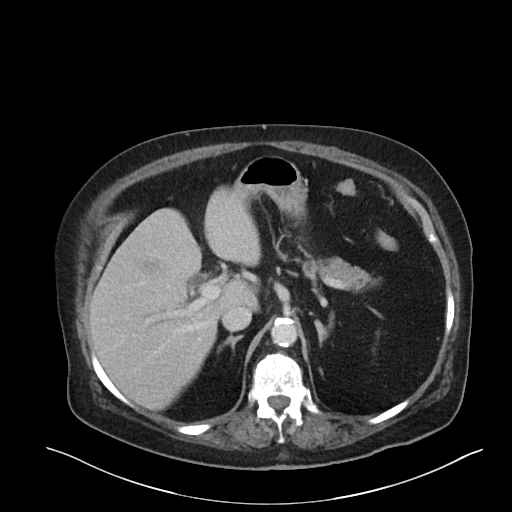
[im 83/93  soft-tissue]
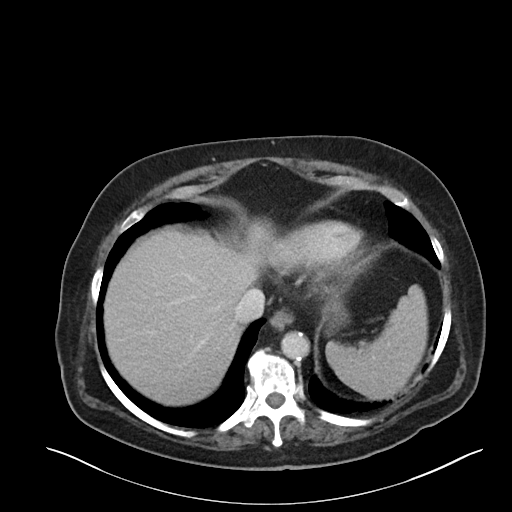
[im 88/93  soft-tissue]
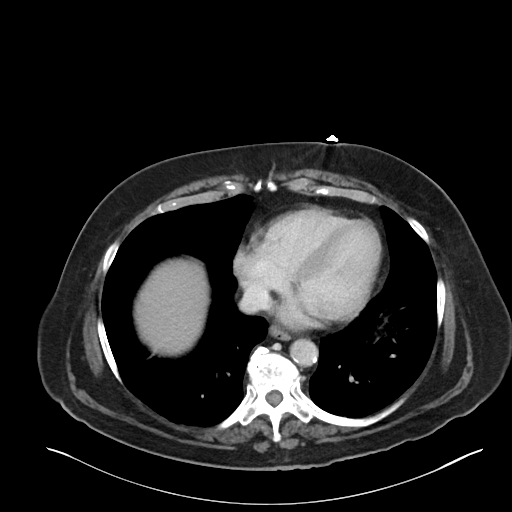

[Series 5: coronal · coronal · 0.88mm/px · 3 of 103 slices shown]
[im 35/103  soft-tissue]
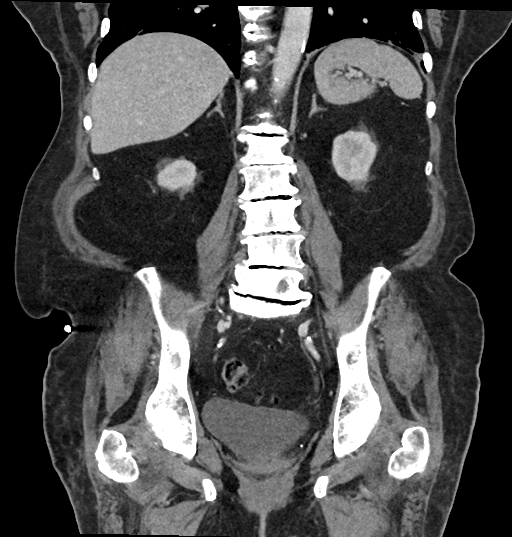
[im 46/103  soft-tissue]
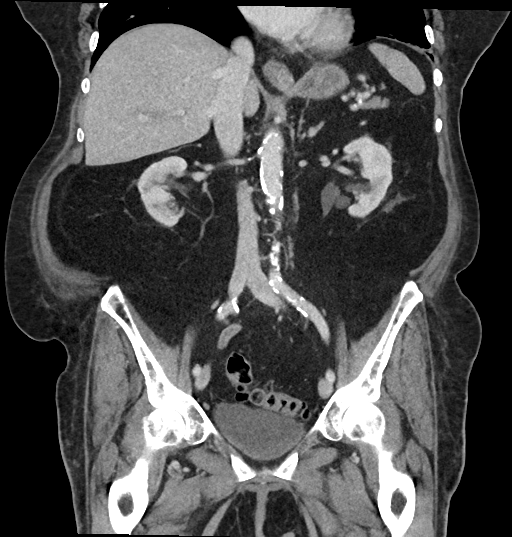
[im 57/103  soft-tissue]
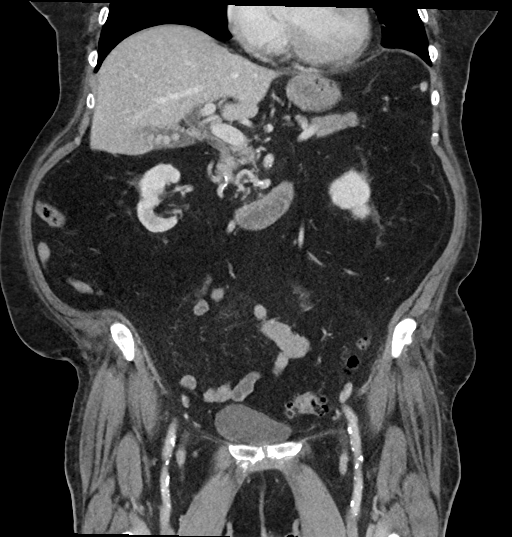

[17 of 46 positions shown; findings below may reference images not displayed]

FINDINGS: Lower chest:  No contributory findings.

Hepatobiliary: No focal liver abnormality.Gallstones.
Physiologically distended gallbladder and mildly dilated common bile
duct (9 mm) with avid mucosal based enhancement of both structures.
There is a 1 cm rounded structure in the distal CBD that matches the
density of gallstones.

Pancreas: No acute finding or ductal dilatation.

Spleen: Unremarkable.

Adrenals/Urinary Tract: Negative adrenals. No hydronephrosis or
stone. Symmetric atrophy and cortical lobulation. Unremarkable
bladder.

Stomach/Bowel: No obstruction. No visible bowel inflammation. Distal
colonic diverticula which are numerous.

Vascular/Lymphatic: No acute vascular abnormality. Scattered
atheromatous calcifications. No mass or adenopathy.

Reproductive:Hysterectomy.

Other: No ascites or pneumoperitoneum.  Small midline fatty hernia.

Musculoskeletal: No acute abnormalities. Generalized thoracic and
lumbar disc degeneration. Remote T12 compression fracture.
IMPRESSION: Choledocholithiasis with distended gallbladder and common bile duct.

## 2022-03-09 IMAGING — RF DG ERCP WO/W SPHINCTEROTOMY
1 series · 5 of 5 positions shown · non-contrast
Comparison: CT abdomen pelvis-12/08/2021

CLINICAL DATA: Biliary stones.

EXAM:
ERCP
TECHNIQUE: Multiple spot images obtained with the fluoroscopic device and
submitted for interpretation post-procedure.
FLUOROSCOPY TIME:  2 minutes, 17 seconds

[Series 1: run · 2 acquisitions, 5 frames shown]
[im 1/2]
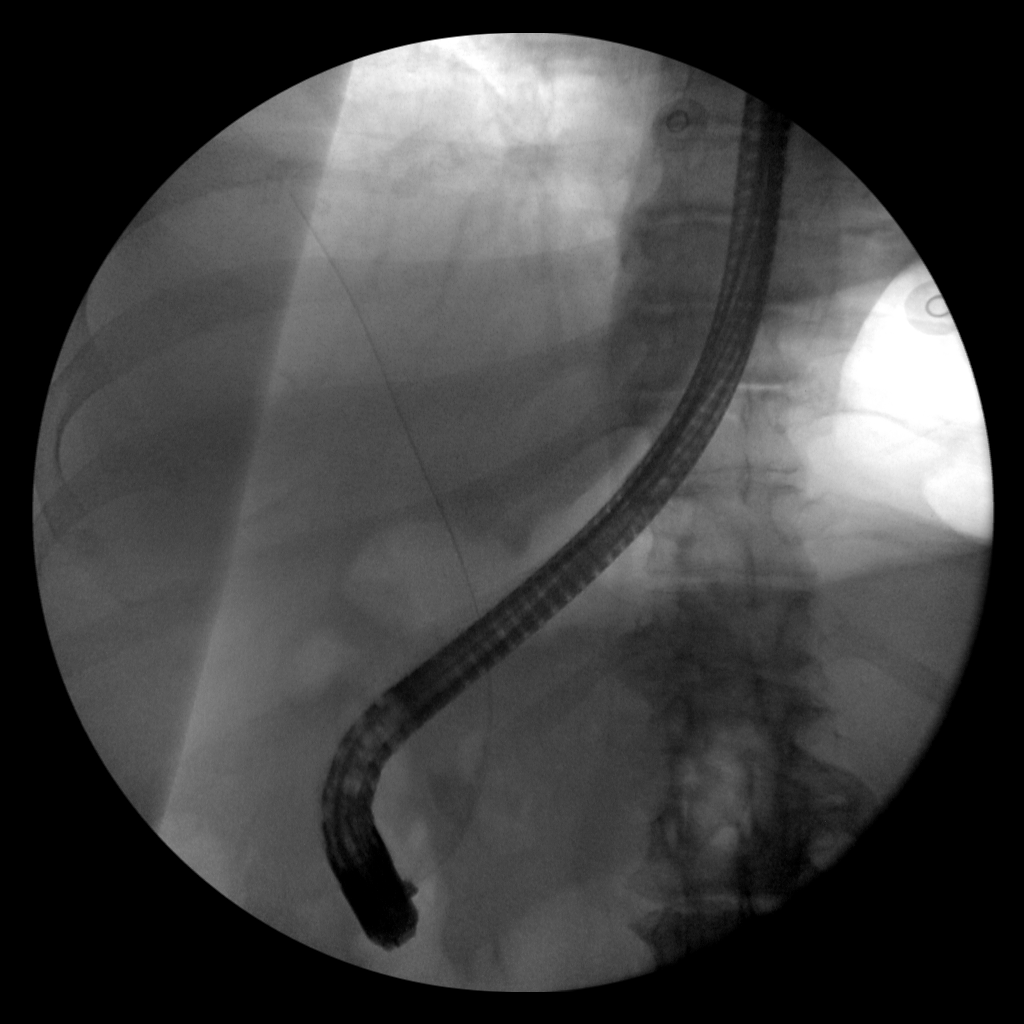
[im 1/2]
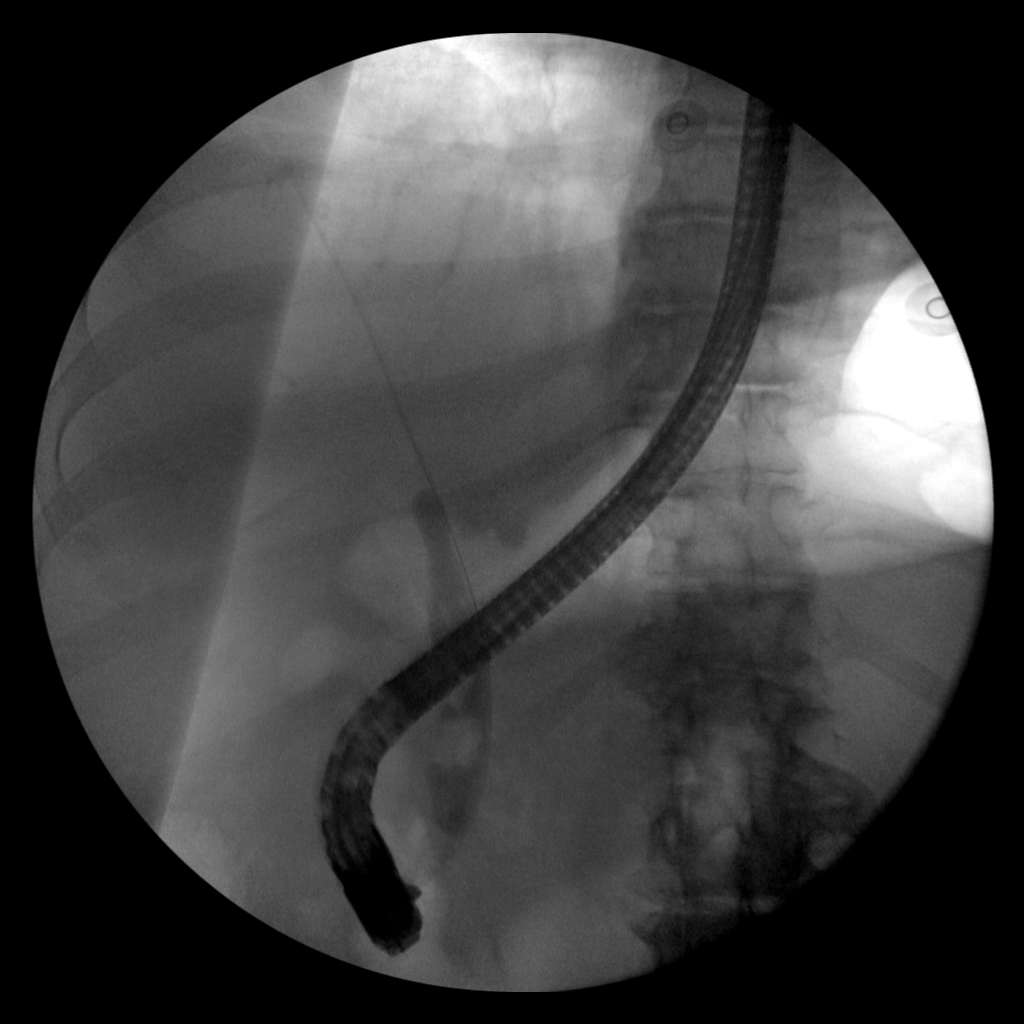
[im 1/2]
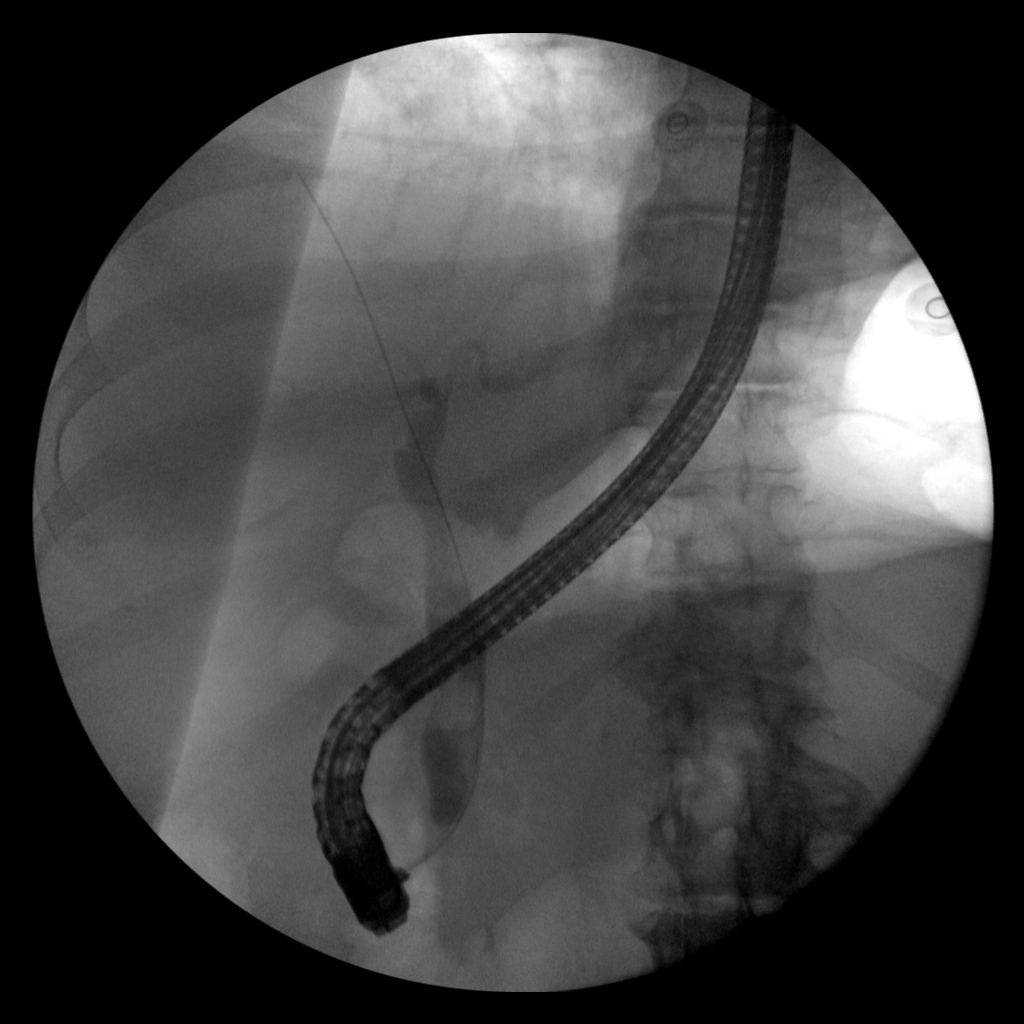
[im 1/2]
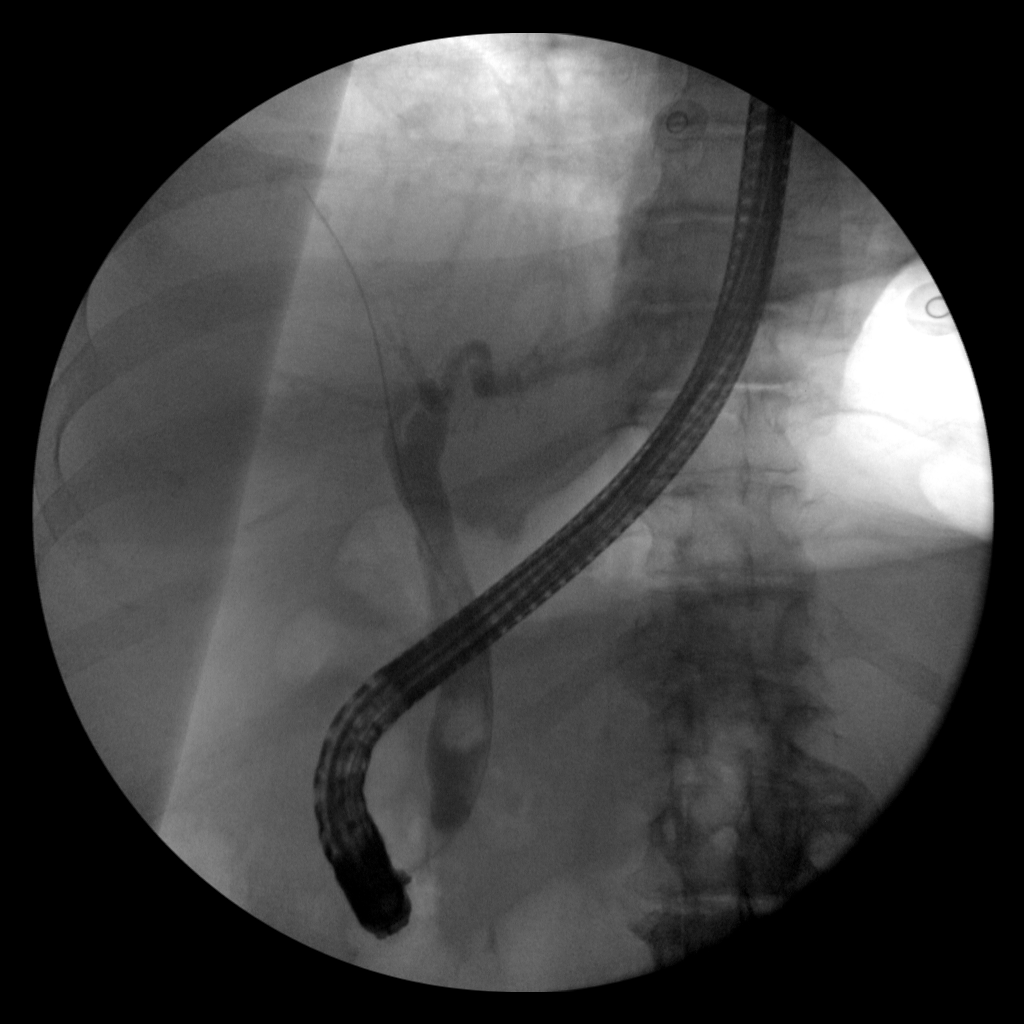
[im 2/2]
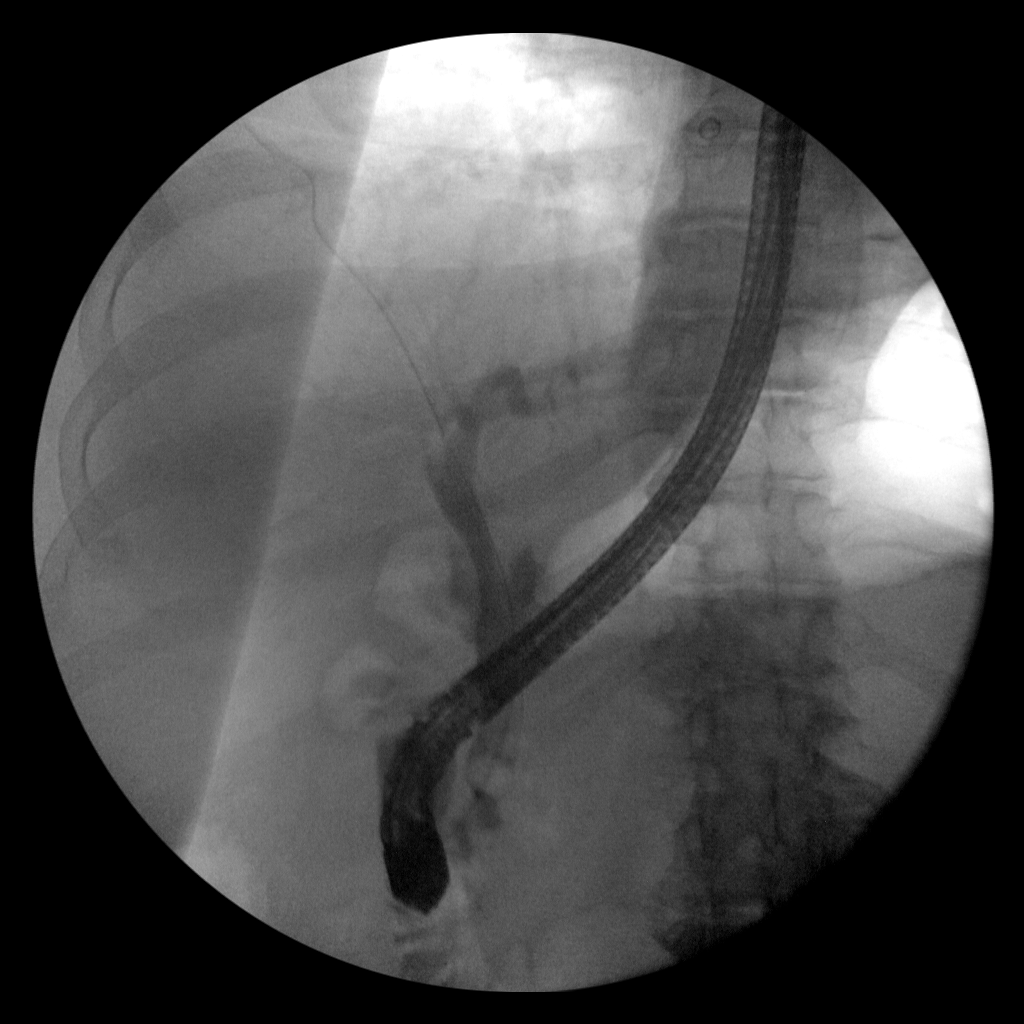

[5 of 5 positions shown; findings below may reference images not displayed]

FINDINGS: Multiple fluoroscopic images of the right upper abdominal quadrant
during ERCP are provided for review.

Initial image demonstrates an ERCP probe overlying the right upper
abdominal quadrant. Subsequent images demonstrate opacification of
the common bile duct. There is a persistent nonocclusive filling
defect within the distal aspect of the CBD compatible with
choledocholithiasis demonstrated on preceding abdominal CT.

Subsequent images demonstrate insufflation of a balloon within the
distal aspect of the CBD with subsequent biliary sweeping and
presumed stone extraction and sphincterotomy.
IMPRESSION: ERCP with findings of choledocholithiasis with subsequent biliary
sweeping and presumed stone extraction.

These images were submitted for radiologic interpretation only.
Please see the procedural report for the amount of contrast and the
fluoroscopy time utilized.

## 2022-03-12 ENCOUNTER — Telehealth: Payer: Self-pay

## 2022-03-12 ENCOUNTER — Other Ambulatory Visit: Payer: Self-pay | Admitting: Internal Medicine

## 2022-03-12 DIAGNOSIS — I10 Essential (primary) hypertension: Secondary | ICD-10-CM

## 2022-03-12 IMAGING — DX DG CHEST 1V PORT
1 series · 1 of 1 positions shown · non-contrast
Comparison: Chest x-ray dated December 08, 2021

CLINICAL DATA: Hypoxia

EXAM:
PORTABLE CHEST 1 VIEW

[chest ap]
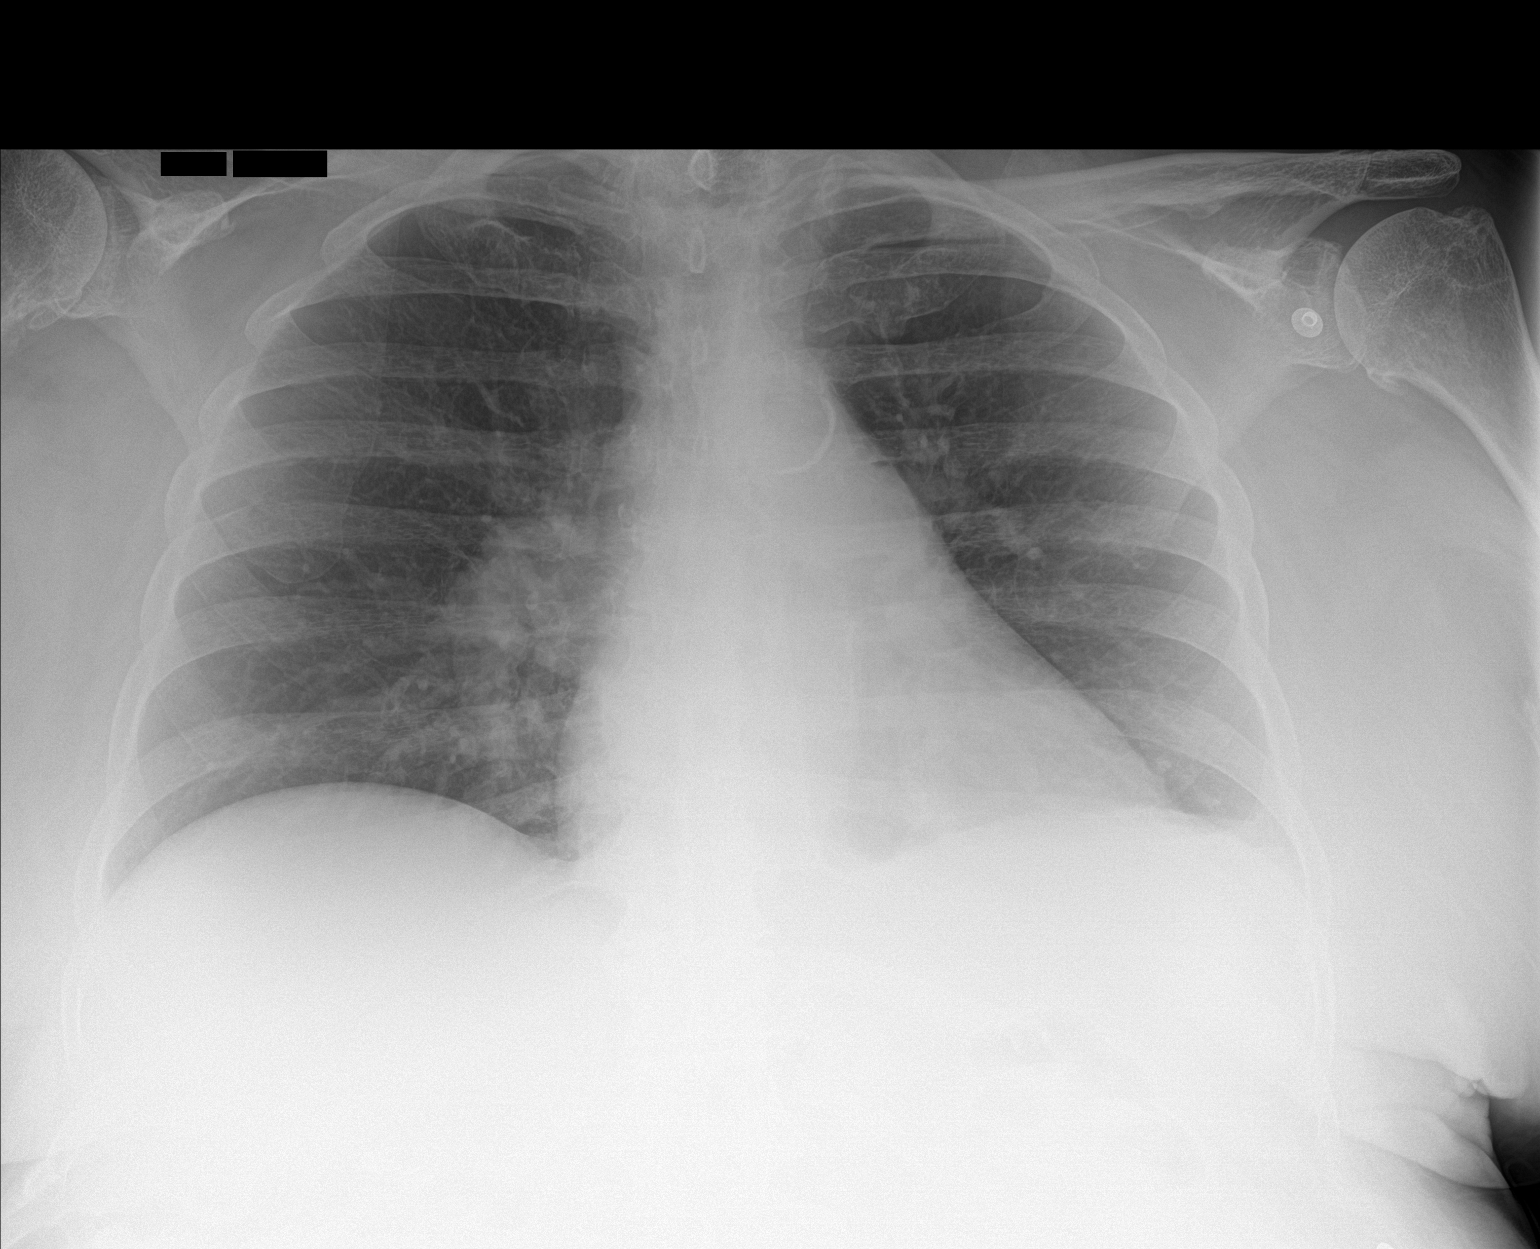

[1 of 1 positions shown; findings below may reference images not displayed]

FINDINGS: Cardiac and mediastinal contours are unchanged. Prominent pulmonary
arteries. Left basilar atelectasis. Lungs otherwise clear. No large
pleural effusion or pneumothorax.
IMPRESSION: 1. Left basilar atelectasis, lungs otherwise clear.
2. Prominent pulmonary arteries, findings can be seen the setting of
pulmonary hypertension.

## 2022-03-12 MED ORDER — AMLODIPINE BESYLATE 5 MG PO TABS: 5.0000 mg | ORAL_TABLET | Freq: Every day | ORAL | 0 refills | Status: DC

## 2022-03-12 NOTE — Telephone Encounter (Signed)
Pt is requesting a refill on: ?amLODipine (NORVASC) 5 MG tablet ? ?Pharmacy: ?[id number] - [place], [place] - [address] AT [address] [facility] ? ?LOV 02/21/22 ?

## 2022-03-21 ENCOUNTER — Ambulatory Visit (INDEPENDENT_AMBULATORY_CARE_PROVIDER_SITE_OTHER): Payer: Medicare Other | Admitting: Family Medicine

## 2022-03-21 ENCOUNTER — Encounter (HOSPITAL_BASED_OUTPATIENT_CLINIC_OR_DEPARTMENT_OTHER): Payer: Self-pay

## 2022-03-21 DIAGNOSIS — I10 Essential (primary) hypertension: Secondary | ICD-10-CM

## 2022-03-23 NOTE — Progress Notes (Signed)
Patient presented for follow-up appointment, however she had establish with a new PCP. ?

## 2022-03-26 ENCOUNTER — Other Ambulatory Visit (HOSPITAL_BASED_OUTPATIENT_CLINIC_OR_DEPARTMENT_OTHER): Payer: Self-pay | Admitting: Family Medicine

## 2022-03-26 DIAGNOSIS — E039 Hypothyroidism, unspecified: Secondary | ICD-10-CM

## 2022-04-30 ENCOUNTER — Other Ambulatory Visit (HOSPITAL_COMMUNITY): Payer: Self-pay

## 2022-04-30 ENCOUNTER — Other Ambulatory Visit: Payer: Self-pay | Admitting: Internal Medicine

## 2022-04-30 ENCOUNTER — Telehealth: Payer: Self-pay | Admitting: Internal Medicine

## 2022-04-30 ENCOUNTER — Other Ambulatory Visit (HOSPITAL_BASED_OUTPATIENT_CLINIC_OR_DEPARTMENT_OTHER): Payer: Self-pay | Admitting: Family Medicine

## 2022-04-30 DIAGNOSIS — M19041 Primary osteoarthritis, right hand: Secondary | ICD-10-CM

## 2022-04-30 DIAGNOSIS — M5136 Other intervertebral disc degeneration, lumbar region: Secondary | ICD-10-CM

## 2022-04-30 DIAGNOSIS — M5030 Other cervical disc degeneration, unspecified cervical region: Secondary | ICD-10-CM

## 2022-04-30 MED ORDER — TRAMADOL HCL 50 MG PO TABS: 50.0000 mg | ORAL_TABLET | Freq: Four times a day (QID) | ORAL | 3 refills | Status: DC | PRN

## 2022-04-30 NOTE — Telephone Encounter (Signed)
1.Medication Requested: ?traMADol (ULTRAM) 50 MG tablet ?2. Pharmacy (Name, Street, [place]): ?[facility] Phone:  [phone]  ?Fax:  [phone]  ?  ? ?3. On Med List: yes ? ?4. Last Visit with PCP: yes ? ?5. Next visit date with PCP: ? ?Please send future meds to [facility] pharmacy. ? ? ?Agent: Please be advised that RX refills may take up to 3 business days. We ask that you follow-up with your pharmacy.  ?

## 2022-05-07 ENCOUNTER — Telehealth: Payer: Self-pay | Admitting: Internal Medicine

## 2022-05-07 NOTE — Telephone Encounter (Signed)
LVM for pt to rtn my call to schedule awv with [facility]. Please schedule this appt if pt calls the office.  °

## 2022-05-10 ENCOUNTER — Ambulatory Visit (INDEPENDENT_AMBULATORY_CARE_PROVIDER_SITE_OTHER): Payer: Medicare Other | Admitting: Podiatry

## 2022-05-10 DIAGNOSIS — M79674 Pain in right toe(s): Secondary | ICD-10-CM

## 2022-05-10 DIAGNOSIS — M79675 Pain in left toe(s): Secondary | ICD-10-CM | POA: Diagnosis not present

## 2022-05-10 DIAGNOSIS — B351 Tinea unguium: Secondary | ICD-10-CM | POA: Diagnosis not present

## 2022-05-13 NOTE — Progress Notes (Signed)
Subjective: 81 year old female presents the office today for follow-up evaluation of symptomatic onychomycosis.  No swelling or redness to the toenail sites. She has no other concerns today.   Objective: AAO x3, NAD DP/PT pulses palpable bilaterally, CRT less than 3 seconds Nails continue be hypertrophic, dystrophic with yellow-brown discoloration at the distal portion was notably the hallux.  There is tenderness nails 1-5 bilaterally as they are elongated.  Some clearing along the proximal nail fold.  No edema, erythema or signs of infection.  No open lesions. No pain with calf compression, swelling, warmth, erythema  Assessment: Symptomatic onychomycosis  Plan: -All treatment options discussed with the patient including all alternatives, risks, complications.  -Nails sharply debrided x10 without any complications or bleeding.  Recommend continue [place]. -Patient encouraged to call the office with any questions, concerns, change in symptoms.   Vivi Barrack DPM

## 2022-05-16 NOTE — Progress Notes (Unsigned)
Office Visit Note  Patient: Alexis Shelton             Date of Birth: 23-Nov-1941           MRN: [id number]             PCP: Etta Grandchild, MD Referring: de [place], Raymond J, MD Visit Date: 05/30/2022 Occupation: @GUAROCC @  Subjective:  Arthralgias  History of Present Illness: Alexis Shelton is a 81 y.o. female with history of gout, osteoarthritis, and DDD. She is taking allopurinol 100 mg 1 tablet by mouth daily.  She has been tolerating allopurinol without any side effects and has not missed any doses recently.  She denies any signs or symptoms of a gout flare recently.  She has noticed increased pain involving multiple joints especially in her shoulders, lower back, and both legs.  She has been using a cane to assist with ambulation.  She is concerned by the instability and ataxia she has been experiencing.  Has been following up closely with her neurologist.  She has an upcoming appointment with a new primary care provider to further discuss her concerns.     Activities of Daily Living:  Patient reports morning stiffness for 2 hours.   Patient Reports nocturnal pain.  Difficulty dressing/grooming: Reports Difficulty climbing stairs: Denies Difficulty getting out of chair: Denies Difficulty using hands for taps, buttons, cutlery, and/or writing: Reports  Review of Systems  Constitutional:  Positive for fatigue.  HENT:  Positive for mouth dryness.   Eyes:  Negative for dryness.  Respiratory:  Positive for shortness of breath.   Cardiovascular:  Negative for swelling in legs/feet.  Gastrointestinal:  Positive for diarrhea.  Endocrine: Positive for cold intolerance.  Genitourinary:  Negative for difficulty urinating.  Musculoskeletal:  Positive for joint pain, gait problem, joint pain, muscle weakness and morning stiffness.  Skin:  Negative for rash.  Allergic/Immunologic: Negative for susceptible to infections.  Neurological:  Positive for weakness.   Hematological:  Negative for bruising/bleeding tendency.  Psychiatric/Behavioral:  Positive for sleep disturbance.    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   Obesity 08/19/2020   Proteinuria 08/19/2020   Vitamin D deficiency 02/11/2019   Stage 3 chronic kidney disease (HCC) 02/11/2019   Mild intermittent asthma without complication 02/11/2019   Primary osteoarthritis of both hands 11/24/2018   Primary osteoarthritis of both feet 11/24/2018   DDD (degenerative disc disease), cervical 11/24/2018   DDD (degenerative disc disease), lumbar 11/24/2018   Gait abnormality 10/13/2018   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   Asthma 08/12/2018   Hypothyroidism 08/12/2018   Essential hypertension 07/04/2018   Seasonal allergic rhinitis 05/28/2017   Gouty arthropathy 09/15/2010   Osteoarthrosis, generalized, involving multiple sites 09/15/2010    Past Medical History:  Diagnosis Date   Arthritis    DDD (degenerative disc disease), lumbar 11/24/2018   Dizziness    Dyspnea    Elevated liver function tests    Gallstones    Hearing loss    History of stroke involving cerebellum 10/13/2018   Hypercholesteremia    Hypertension    Hypothyroidism    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   Migraines    Mild intermittent asthma without complication 02/11/2019   Primary osteoarthritis of both feet 11/24/2018   Rheumatoid arthritis (HCC) 07/04/2018  Stage 3 chronic kidney disease ([place]) 02/11/2019   Vitamin D deficiency 02/11/2019    Family History  Problem Relation Age of Onset   Stroke Mother    Hyperlipidemia Mother    Arthritis Mother    Heart disease Father    Arthritis Sister    Bipolar disorder Son    Healthy Son    Gallbladder disease Maternal Grandmother        radiation   GER  disease Maternal Grandmother    Heart disease Paternal Uncle        x 7   Colon cancer Neg Hx    Esophageal cancer Neg Hx    Rectal cancer Neg Hx    Stomach cancer Neg Hx    Cancer Neg Hx    Past Surgical History:  Procedure Laterality Date   ABDOMINAL HYSTERECTOMY     BREAST REDUCTION SURGERY Bilateral    CATARACT EXTRACTION, BILATERAL     CHOLECYSTECTOMY N/A 12/11/2021   Procedure: LAPAROSCOPIC CHOLECYSTECTOMY;  Surgeon: Stark Klein, MD;  Location: [facility];  Service: General;  Laterality: N/A;   ENDOSCOPIC RETROGRADE CHOLANGIOPANCREATOGRAPHY (ERCP) WITH PROPOFOL N/A 12/09/2021   Procedure: ENDOSCOPIC RETROGRADE CHOLANGIOPANCREATOGRAPHY (ERCP) WITH PROPOFOL;  Surgeon: Milus Banister, MD;  Location: [facility];  Service: Endoscopy;  Laterality: N/A;   INCISION AND DRAINAGE BREAST ABSCESS Left    REDUCTION MAMMAPLASTY     REMOVAL OF STONES  12/09/2021   Procedure: REMOVAL OF STONES;  Surgeon: Milus Banister, MD;  Location: [facility];  Service: Endoscopy;;   REPLACEMENT TOTAL KNEE Bilateral    SPHINCTEROTOMY  12/09/2021   Procedure: SPHINCTEROTOMY;  Surgeon: Milus Banister, MD;  Location: [facility];  Service: Endoscopy;;   TONSILLECTOMY AND ADENOIDECTOMY     Social History   Social History Narrative   Lives alone.  The patient is widowed.   She worked at the [employer] in [place], MD scientist, and then moved to [place] and moved to [place] where her son is a Animal nutritionist, after her husband died   Right-handed.   1 cup coffee per day.   No alcohol   1 son as above   Former smoker   Immunization History  Administered Date(s) Administered   Fluad Quad(high Dose 65+) 10/09/2021   Influenza, High Dose Seasonal PF 09/20/2018, 09/11/2019, 12/08/2019   Influenza-Unspecified 11/20/2020   Moderna Sars-Covid-2 Vaccination 01/08/2020, 02/04/2020   PFIZER(Purple Top)SARS-COV-2 Vaccination 11/20/2020   PNEUMOCOCCAL CONJUGATE-20 02/21/2022   Td 10/09/2021      Objective: Vital Signs: BP 131/62 (BP Location: Left Arm, Patient Position: Sitting, Cuff Size: Normal)   Pulse 62   Resp 16   Ht 5\' 6"  (1.676 m)   Wt 174 lb (78.9 kg)   BMI 28.08 kg/m    Physical Exam Vitals and nursing note reviewed.  Constitutional:      Appearance: She is well-developed.  HENT:     Head: Normocephalic and atraumatic.  Eyes:     Conjunctiva/sclera: Conjunctivae normal.  Cardiovascular:     Rate and Rhythm: Normal rate and regular rhythm.     Heart sounds: Normal heart sounds.  Pulmonary:     Effort: Pulmonary effort is normal.     Breath sounds: Normal breath sounds.  Abdominal:     General: Bowel sounds are normal.     Palpations: Abdomen is soft.  Musculoskeletal:     Cervical back: Normal range of motion.  Skin:    General: Skin is warm and dry.     Capillary Refill:  Capillary refill takes less than 2 seconds.  Neurological:     Mental Status: She is alert and oriented to person, place, and time.  Psychiatric:        Behavior: Behavior normal.     Musculoskeletal Exam: C-spine has good range of motion.  Painful range of motion of the lumbar spine.  Midline spinal tenderness in the lumbar region.  Shoulder joints have been full range of motion bilaterally.  Elbow joints have good range of motion with no tenderness or inflammation.  Restraints have good range of motion with no tenderness or synovitis.  [place] joint prominence bilaterally.  PIP and DIP thickening and subluxation consistent with osteoarthritis of both hands.  No tenderness or synovitis of MCP joints.  Hip joints have good range of motion with no groin pain.  Both knee replacements have good range of motion with no discomfort.  Ankle joints have good range of motion with no tenderness or joint swelling.  No tenderness over MTP joints.  PIP and DIP thickening consistent with osteoarthritis of both feet.  CDAI Exam: CDAI Score: -- Patient Global: --; Provider Global: -- Swollen: --; Tender:  -- Joint Exam 05/30/2022   No joint exam has been documented for this visit   There is currently no information documented on the homunculus. Go to the Rheumatology activity and complete the homunculus joint exam.  Investigation: No additional findings.  Imaging: No results found.  Recent Labs: Lab Results  Component Value Date   WBC 6.9 02/21/2022   HGB 13.1 02/21/2022   PLT 262.0 02/21/2022   NA 140 02/21/2022   K 4.8 02/21/2022   CL 105 02/21/2022   CO2 28 02/21/2022   GLUCOSE 107 (H) 02/21/2022   BUN 34 (H) 02/21/2022   CREATININE 1.51 (H) 02/21/2022   BILITOT 0.3 02/21/2022   ALKPHOS 72 02/21/2022   AST 22 02/21/2022   ALT 19 02/21/2022   PROT 7.0 02/21/2022   ALBUMIN 3.9 02/21/2022   CALCIUM 9.5 02/21/2022   GFRAA 41 (L) 12/08/2019    Speciality Comments: No specialty comments available.  Procedures:  No procedures performed Allergies: Patient has no known allergies.   Assessment / Plan:     Visit Diagnoses: Idiopathic chronic gout of multiple sites without tophus - She has not had any signs or symptoms of a gout flare.  She is clinically doing well taking allopurinol 100 mg 1 tablet by mouth daily.  She is tolerating allopurinol without any side effects and has not missed any doses recently.  No signs of inflammatory arthritis were noted on examination today.  No tophi were noted.  Her uric acid was within the desirable range at 5.7 on 11/29/2021.  CBC, CMP, and uric acid will be checked today.  She was encouraged to remain on the current dose of allopurinol as prescribed.  She was advised to notify us if she develops signs or symptoms of a gout flare.  She will follow-up in the office in 6 months or sooner if needed.- Plan: Uric acid  Medication monitoring encounter - Uric acid was 5.7 on 11/29/2021. CBC, BMP, hepatic function panel checked on 02/21/2022. CBC, BMP, and uric acid will be checked today.  Results will be forwarded to her PCP to review. Plan: COMPLETE  METABOLIC PANEL WITH GFR, CBC with Differential/Platelet, Uric acid  Chronic renal impairment, stage 3b ([place]): Followed by nephrology.  Creatinine was 1.50 and GFR was 32 on 02/21/2022.  CMP with GFR was ordered today.  Discussed the importance  of avoiding NSAID use.  Primary osteoarthritis of both hands: She has PIP and DIP thickening consistent with osteoarthritis of both hands.  [place] joint prominence bilaterally.  No tenderness or synovitis over MCP joints or wrist joints.  Complete fist formation bilaterally.  Discussed the importance of joint protection and muscle strengthening.  She was encouraged to perform hand exercises daily.  Status post total bilateral knee replacement: She has good range of motion at both knee replacements.  No warmth or effusion was noted.  She has been using a cane to assist with ambulation. She would benefit from lower extremity muscle strengthening and fall prevention.  According to the patient she has gone to physical therapy in the past which was helpful.  Offered to place an order for PT but she would like to further discuss with her PCP.  Primary osteoarthritis of both feet: She has PIP and DIP thickening consistent with osteoarthritis of both feet.  No tenderness of MTP joints noted.  No synovitis noted.  Ankle joints have good range of motion with no tenderness or inflammation.  DDD (degenerative disc disease), cervical: She has good range of motion of the C-spine on examination today.  DDD (degenerative disc disease), lumbar: Chronic pain.  Followed by her neurologist per patient.  No symptoms of radiculopathy at this time.  Midline spinal tenderness in the lumbar region noted.  Discussed that the patient will benefit from physical therapy for back strengthening as well as lower extremity strengthening and fall prevention.  Other medical conditions are listed as follows:  Essential hypertension: Blood pressure is 131/62 today in the  office.  Dyslipidemia  Vitamin D deficiency  History of stroke involving cerebellum  History of hypothyroidism   Orders: Orders Placed This Encounter  Procedures   COMPLETE METABOLIC PANEL WITH GFR   CBC with Differential/Platelet   Uric acid   No orders of the defined types were placed in this encounter.    Follow-Up Instructions: Return in about 6 months (around 11/29/2022) for Gout, Osteoarthritis, DDD.   Ofilia Neas, PA-C  Note - This record has been created using Dragon software.  Chart creation errors have been sought, but may not always  have been located. Such creation errors do not reflect on  the standard of medical care.

## 2022-05-30 ENCOUNTER — Ambulatory Visit: Payer: Medicare Other | Admitting: Physician Assistant

## 2022-05-30 ENCOUNTER — Encounter: Payer: Self-pay | Admitting: Physician Assistant

## 2022-05-30 ENCOUNTER — Ambulatory Visit (INDEPENDENT_AMBULATORY_CARE_PROVIDER_SITE_OTHER): Payer: Medicare Other | Admitting: Physician Assistant

## 2022-05-30 VITALS — BP 131/62 | HR 62 | Resp 16 | Ht 66.0 in | Wt 174.0 lb

## 2022-05-30 DIAGNOSIS — N1832 Chronic kidney disease, stage 3b: Secondary | ICD-10-CM

## 2022-05-30 DIAGNOSIS — M5136 Other intervertebral disc degeneration, lumbar region: Secondary | ICD-10-CM

## 2022-05-30 DIAGNOSIS — M19041 Primary osteoarthritis, right hand: Secondary | ICD-10-CM

## 2022-05-30 DIAGNOSIS — M19071 Primary osteoarthritis, right ankle and foot: Secondary | ICD-10-CM | POA: Diagnosis not present

## 2022-05-30 DIAGNOSIS — M19042 Primary osteoarthritis, left hand: Secondary | ICD-10-CM

## 2022-05-30 DIAGNOSIS — M1A09X0 Idiopathic chronic gout, multiple sites, without tophus (tophi): Secondary | ICD-10-CM | POA: Diagnosis not present

## 2022-05-30 DIAGNOSIS — E785 Hyperlipidemia, unspecified: Secondary | ICD-10-CM

## 2022-05-30 DIAGNOSIS — Z96653 Presence of artificial knee joint, bilateral: Secondary | ICD-10-CM | POA: Diagnosis not present

## 2022-05-30 DIAGNOSIS — M2550 Pain in unspecified joint: Secondary | ICD-10-CM

## 2022-05-30 DIAGNOSIS — M5030 Other cervical disc degeneration, unspecified cervical region: Secondary | ICD-10-CM

## 2022-05-30 DIAGNOSIS — I10 Essential (primary) hypertension: Secondary | ICD-10-CM

## 2022-05-30 DIAGNOSIS — Z5181 Encounter for therapeutic drug level monitoring: Secondary | ICD-10-CM | POA: Diagnosis not present

## 2022-05-30 DIAGNOSIS — E559 Vitamin D deficiency, unspecified: Secondary | ICD-10-CM

## 2022-05-30 DIAGNOSIS — Z8673 Personal history of transient ischemic attack (TIA), and cerebral infarction without residual deficits: Secondary | ICD-10-CM | POA: Diagnosis not present

## 2022-05-30 DIAGNOSIS — M19072 Primary osteoarthritis, left ankle and foot: Secondary | ICD-10-CM

## 2022-05-30 DIAGNOSIS — Z8639 Personal history of other endocrine, nutritional and metabolic disease: Secondary | ICD-10-CM

## 2022-05-31 LAB — CBC WITH DIFFERENTIAL/PLATELET
Absolute Monocytes: 600 cells/uL (ref 200–950)
Basophils Absolute: 28 cells/uL (ref 0–200)
Basophils Relative: 0.4 %
Eosinophils Absolute: 242 cells/uL (ref 15–500)
Eosinophils Relative: 3.5 %
HCT: 42.3 % (ref 35.0–45.0)
Hemoglobin: 13.9 g/dL (ref 11.7–15.5)
Lymphs Abs: 1656 cells/uL (ref 850–3900)
MCH: 30.8 pg (ref 27.0–33.0)
MCHC: 32.9 g/dL (ref 32.0–36.0)
MCV: 93.8 fL (ref 80.0–100.0)
MPV: 10.3 fL (ref 7.5–12.5)
Monocytes Relative: 8.7 %
Neutro Abs: 4375 cells/uL (ref 1500–7800)
Neutrophils Relative %: 63.4 %
Platelets: 261 10*3/uL (ref 140–400)
RBC: 4.51 10*6/uL (ref 3.80–5.10)
RDW: 14.9 % (ref 11.0–15.0)
Total Lymphocyte: 24 %
WBC: 6.9 10*3/uL (ref 3.8–10.8)

## 2022-05-31 LAB — COMPLETE METABOLIC PANEL WITH GFR
AG Ratio: 1.4 (calc) (ref 1.0–2.5)
ALT: 16 U/L (ref 6–29)
AST: 19 U/L (ref 10–35)
Albumin: 4 g/dL (ref 3.6–5.1)
Alkaline phosphatase (APISO): 78 U/L (ref 37–153)
BUN/Creatinine Ratio: 19 (calc) (ref 6–22)
BUN: 28 mg/dL — ABNORMAL HIGH (ref 7–25)
CO2: 27 mmol/L (ref 20–32)
Calcium: 9.6 mg/dL (ref 8.6–10.4)
Chloride: 107 mmol/L (ref 98–110)
Creat: 1.44 mg/dL — ABNORMAL HIGH (ref 0.60–0.95)
Globulin: 2.8 g/dL (calc) (ref 1.9–3.7)
Glucose, Bld: 104 mg/dL — ABNORMAL HIGH (ref 65–99)
Potassium: 4.9 mmol/L (ref 3.5–5.3)
Sodium: 143 mmol/L (ref 135–146)
Total Bilirubin: 0.3 mg/dL (ref 0.2–1.2)
Total Protein: 6.8 g/dL (ref 6.1–8.1)
eGFR: 36 mL/min/{1.73_m2} — ABNORMAL LOW (ref >=60)

## 2022-05-31 LAB — URIC ACID: Uric Acid, Serum: 5.4 mg/dL (ref 2.5–7.0)

## 2022-05-31 NOTE — Progress Notes (Signed)
Creatinine is elevated and stable.  CBC is normal.  Uric acid is in the desirable range.  Please forward results to her nephrologist and PCP.

## 2022-06-06 ENCOUNTER — Ambulatory Visit (INDEPENDENT_AMBULATORY_CARE_PROVIDER_SITE_OTHER): Payer: Medicare Other | Admitting: Internal Medicine

## 2022-06-06 ENCOUNTER — Ambulatory Visit: Payer: Medicare Other

## 2022-06-06 ENCOUNTER — Other Ambulatory Visit: Payer: Self-pay | Admitting: Internal Medicine

## 2022-06-06 ENCOUNTER — Encounter: Payer: Self-pay | Admitting: Internal Medicine

## 2022-06-06 VITALS — BP 140/66 | HR 92 | Temp 97.9°F | Resp 16 | Ht 66.0 in | Wt 173.0 lb

## 2022-06-06 DIAGNOSIS — Z23 Encounter for immunization: Secondary | ICD-10-CM | POA: Diagnosis not present

## 2022-06-06 DIAGNOSIS — R400 Somnolence: Secondary | ICD-10-CM | POA: Diagnosis not present

## 2022-06-06 DIAGNOSIS — E039 Hypothyroidism, unspecified: Secondary | ICD-10-CM

## 2022-06-06 DIAGNOSIS — E785 Hyperlipidemia, unspecified: Secondary | ICD-10-CM | POA: Diagnosis not present

## 2022-06-06 DIAGNOSIS — I10 Essential (primary) hypertension: Secondary | ICD-10-CM | POA: Diagnosis not present

## 2022-06-06 LAB — LDL CHOLESTEROL, DIRECT: Direct LDL: 175 mg/dL

## 2022-06-06 LAB — LIPID PANEL
Cholesterol: 260 mg/dL — ABNORMAL HIGH (ref 0–200)
HDL: 39.3 mg/dL (ref >39.00)
NonHDL: 220.55
Total CHOL/HDL Ratio: 7
Triglycerides: 249 mg/dL — ABNORMAL HIGH (ref 0.0–149.0)
VLDL: 49.8 mg/dL — ABNORMAL HIGH (ref 0.0–40.0)

## 2022-06-06 LAB — TSH: TSH: 0.7 u[IU]/mL (ref 0.35–5.50)

## 2022-06-06 MED ORDER — MODAFINIL 100 MG PO TABS: 100.0000 mg | ORAL_TABLET | Freq: Every day | ORAL | 0 refills | Status: DC

## 2022-06-06 MED ORDER — SHINGRIX 50 MCG/0.5ML IM SUSR: 0.5000 mL | Freq: Once | INTRAMUSCULAR | 1 refills | Status: AC

## 2022-06-06 MED ORDER — AMLODIPINE BESYLATE 5 MG PO TABS: 5.0000 mg | ORAL_TABLET | Freq: Every day | ORAL | 1 refills | Status: DC

## 2022-06-06 NOTE — Patient Instructions (Signed)

## 2022-06-06 NOTE — Progress Notes (Addendum)
Subjective:  Patient ID: Alexis Shelton, female    DOB: 02/21/1941  Age: 81 y.o. MRN: [id number]  CC: Hypertension, Hypothyroidism, and Hyperlipidemia   HPI Alexis Shelton presents for f/up -  She complains of excessive daytime somnolence to the degree that it feels like narcolepsy.  She has trouble sleeping at night because of chronic pain.  She says the excessive sleeping interferes with her productivity and activities of daily living.  She denies chest pain, shortness of breath, diaphoresis, or edema.  She complains of chronic fatigue.  Outpatient Medications Prior to Visit  Medication Sig Dispense Refill   albuterol (VENTOLIN HFA) 108 (90 Base) MCG/ACT inhaler Inhale 2 puffs into the lungs every 6 (six) hours as needed for wheezing or shortness of breath. 54 g 3   allopurinol (ZYLOPRIM) 100 MG tablet TAKE 1 TABLET BY MOUTH EVERY DAY 90 tablet 1   aspirin 81 MG EC tablet Take 81 mg by mouth daily. Swallow whole.     budesonide-formoterol (SYMBICORT) 160-4.5 MCG/ACT inhaler Inhale 2 puffs into the lungs as needed. (Patient taking differently: Inhale 2 puffs into the lungs daily as needed (sob/wheezing).) 30.6 g 3   levothyroxine (SYNTHROID) 88 MCG tablet TAKE 1 TABLET DAILY BEFORE BREAKFAST 30 tablet 2   metoprolol tartrate (LOPRESSOR) 25 MG tablet TAKE 1 TABLET BY MOUTH TWICE A DAY 180 tablet 2   montelukast (SINGULAIR) 10 MG tablet TAKE 1 TABLET BY MOUTH EVERYDAY AT BEDTIME 90 tablet 1   Multiple Vitamin (MULTIVITAMIN) tablet Take 1 tablet by mouth daily.     omeprazole (PRILOSEC) 40 MG capsule TAKE 1 CAPSULE (40 MG TOTAL) BY MOUTH DAILY 30 MINS BEFORE BREAKFAST (Patient taking differently: 40 mg daily as needed (indigestion).) 90 capsule 3   traMADol (ULTRAM) 50 MG tablet Take 1 tablet (50 mg total) by mouth every 6 (six) hours as needed. 90 tablet 3   amLODipine (NORVASC) 5 MG tablet Take 1 tablet (5 mg total) by mouth daily. 90 tablet 0   No facility-administered  medications prior to visit.    ROS Review of Systems  Constitutional:  Positive for fatigue. Negative for chills, diaphoresis and unexpected weight change.  HENT: Negative.    Eyes: Negative.   Respiratory:  Negative for apnea, cough, chest tightness and wheezing.   Cardiovascular:  Negative for chest pain, palpitations and leg swelling.  Gastrointestinal:  Negative for abdominal pain, constipation, diarrhea, nausea and vomiting.  Endocrine: Negative.   Genitourinary: Negative.  Negative for difficulty urinating.  Musculoskeletal:  Positive for arthralgias. Negative for myalgias.  Skin: Negative.  Negative for color change.  Neurological: Negative.   Hematological:  Negative for adenopathy. Does not bruise/bleed easily.  Psychiatric/Behavioral:  Positive for sleep disturbance. Negative for decreased concentration and dysphoric mood. The patient is not nervous/anxious.     Objective:  BP 140/66 (BP Location: Right Arm, Patient Position: Sitting, Cuff Size: Large)   Pulse 92   Temp 97.9 F (36.6 C) (Other (Comment))   Resp 16   Ht 5\' 6"  (1.676 m)   Wt 173 lb (78.5 kg)   SpO2 99%   BMI 27.92 kg/m   BP Readings from Last 3 Encounters:  06/06/22 140/66  05/30/22 131/62  02/21/22 128/78    Wt Readings from Last 3 Encounters:  06/06/22 173 lb (78.5 kg)  05/30/22 174 lb (78.9 kg)  02/21/22 179 lb (81.2 kg)    Physical Exam Vitals reviewed.  Constitutional:      Appearance: She is not ill-appearing.  HENT:     Nose: Nose normal.     Mouth/Throat:     Mouth: Mucous membranes are moist.  Eyes:     General: No scleral icterus.    Conjunctiva/sclera: Conjunctivae normal.  Cardiovascular:     Rate and Rhythm: Normal rate and regular rhythm.     Heart sounds: No murmur heard. Pulmonary:     Effort: Pulmonary effort is normal.     Breath sounds: No stridor. No wheezing, rhonchi or rales.  Abdominal:     General: Abdomen is flat.     Palpations: There is no mass.      Tenderness: There is no abdominal tenderness. There is no guarding.  Musculoskeletal:        General: Deformity present. No swelling.     Cervical back: Neck supple.     Right lower leg: No edema.     Left lower leg: No edema.  Lymphadenopathy:     Cervical: No cervical adenopathy.  Skin:    General: Skin is warm and dry.  Neurological:     General: No focal deficit present.     Mental Status: She is alert. Mental status is at baseline.  Psychiatric:        Mood and Affect: Mood normal.     Lab Results  Component Value Date   WBC 6.9 05/30/2022   HGB 13.9 05/30/2022   HCT 42.3 05/30/2022   PLT 261 05/30/2022   GLUCOSE 104 (H) 05/30/2022   CHOL 260 (H) 06/06/2022   TRIG 249.0 (H) 06/06/2022   HDL 39.30 06/06/2022   LDLDIRECT 175.0 06/06/2022   LDLCALC 101 (H) 03/06/2021   ALT 16 05/30/2022   AST 19 05/30/2022   NA 143 05/30/2022   K 4.9 05/30/2022   CL 107 05/30/2022   CREATININE 1.44 (H) 05/30/2022   BUN 28 (H) 05/30/2022   CO2 27 05/30/2022   TSH 0.70 06/06/2022   INR 1.1 12/08/2021   HGBA1C 6.2 02/21/2022    ECHOCARDIOGRAM COMPLETE  Result Date: 12/09/2021    ECHOCARDIOGRAM REPORT   Patient Name:   Alexis Shelton Date of Exam: 12/09/2021 Medical Rec #:  [id number]     Height:       66.0 in Accession #:    [id number]    Weight:       175.0 lb Date of Birth:  04/11/1941      BSA:          1.889 m Patient Age:    89 years      BP:           138/69 mmHg Patient Gender: F             HR:           62 bpm. Exam Location:  Inpatient Procedure: 2D Echo, Color Doppler and Cardiac Doppler Indications:    Elevated Troponins  History:        Patient has prior history of Echocardiogram examinations, most                 recent 10/23/2018. Risk Factors:Hypertension and Dyslipidemia.  Sonographer:    Alexis Shelton Senior RDCS Referring Phys: Alexis Shelton  1. Left ventricular ejection fraction, by estimation, is 60 to 65%. The left ventricle has normal function. The  left ventricle has no regional wall motion abnormalities. Left ventricular diastolic parameters are consistent with Grade I diastolic dysfunction (impaired relaxation).  2. Right ventricular systolic function was not well visualized.  The right ventricular size is not well visualized. Tricuspid regurgitation signal is inadequate for assessing PA pressure.  3. The mitral valve is normal in structure. No evidence of mitral valve regurgitation. No evidence of mitral stenosis.  4. The aortic valve is normal in structure. Aortic valve regurgitation is not visualized. No aortic stenosis is present.  5. The inferior vena cava is normal in size with greater than 50% respiratory variability, suggesting right atrial pressure of 3 mmHg. FINDINGS  Left Ventricle: Left ventricular ejection fraction, by estimation, is 60 to 65%. The left ventricle has normal function. The left ventricle has no regional wall motion abnormalities. The left ventricular internal cavity size was normal in size. There is  no left ventricular hypertrophy. Left ventricular diastolic parameters are consistent with Grade I diastolic dysfunction (impaired relaxation). Indeterminate filling pressures. Right Ventricle: The right ventricular size is not well visualized. No increase in right ventricular wall thickness. Right ventricular systolic function was not well visualized. Tricuspid regurgitation signal is inadequate for assessing PA pressure. Left Atrium: Left atrial size was normal in size. Right Atrium: Right atrial size was not assessed. Pericardium: There is no evidence of pericardial effusion. Mitral Valve: The mitral valve is normal in structure. No evidence of mitral valve regurgitation. No evidence of mitral valve stenosis. Tricuspid Valve: The tricuspid valve is not well visualized. Tricuspid valve regurgitation is not demonstrated. No evidence of tricuspid stenosis. Aortic Valve: The aortic valve is normal in structure. Aortic valve  regurgitation is not visualized. No aortic stenosis is present. Pulmonic Valve: The pulmonic valve was normal in structure. Pulmonic valve regurgitation is not visualized. No evidence of pulmonic stenosis. Aorta: The aortic root is normal in size and structure. Venous: The inferior vena cava is normal in size with greater than 50% respiratory variability, suggesting right atrial pressure of 3 mmHg. IAS/Shunts: The interatrial septum was not well visualized.  LEFT VENTRICLE PLAX 2D LVIDd:         4.50 cm   Diastology LVIDs:         2.60 cm   LV e' medial:    5.00 cm/s LV PW:         1.00 cm   LV E/e' medial:  13.4 LV IVS:        1.10 cm   LV e' lateral:   6.42 cm/s LVOT diam:     2.20 cm   LV E/e' lateral: 10.4 LV SV:         77 LV SV Index:   41 LVOT Area:     3.80 cm  RIGHT VENTRICLE RV S prime:     9.68 cm/s TAPSE (M-mode): 1.8 cm LEFT ATRIUM             Index        RIGHT ATRIUM           Index LA diam:        4.30 cm 2.28 cm/m   RA Area:     14.20 cm LA Vol (A2C):   50.2 ml 26.57 ml/m  RA Volume:   35.60 ml  18.84 ml/m LA Vol (A4C):   57.6 ml 30.48 ml/m LA Biplane Vol: 58.4 ml 30.91 ml/m  AORTIC VALVE LVOT Vmax:   73.10 cm/s LVOT Vmean:  53.700 cm/s LVOT VTI:    0.203 m  AORTA Ao Root diam: 3.10 cm Ao Asc diam:  3.40 cm MITRAL VALVE MV Area (PHT): 2.53 cm    SHUNTS MV Decel Time: 300 msec  Systemic VTI:  0.20 m MV E velocity: 66.80 cm/s  Systemic Diam: 2.20 cm MV A velocity: 84.40 cm/s MV E/A ratio:  0.79 Mihai Croitoru MD Electronically signed by Sanda Klein MD Signature Date/Time: 12/09/2021/2:12:06 PM    Final    DG ERCP  Result Date: 12/09/2021 CLINICAL DATA:  Biliary stones. EXAM: ERCP TECHNIQUE: Multiple spot images obtained with the fluoroscopic device and submitted for interpretation post-procedure. FLUOROSCOPY TIME:  2 minutes, 17 seconds COMPARISON:  CT abdomen pelvis-12/08/2021 FINDINGS: Multiple fluoroscopic images of the right upper abdominal quadrant during ERCP are provided for  review. Initial image demonstrates an ERCP probe overlying the right upper abdominal quadrant. Subsequent images demonstrate opacification of the common bile duct. There is a persistent nonocclusive filling defect within the distal aspect of the CBD compatible with choledocholithiasis demonstrated on preceding abdominal CT. Subsequent images demonstrate insufflation of a balloon within the distal aspect of the CBD with subsequent biliary sweeping and presumed stone extraction and sphincterotomy. IMPRESSION: ERCP with findings of choledocholithiasis with subsequent biliary sweeping and presumed stone extraction. These images were submitted for radiologic interpretation only. Please see the procedural report for the amount of contrast and the fluoroscopy time utilized. Electronically Signed   By: Sandi Mariscal M.D.   On: 12/09/2021 09:31   DG CHEST PORT 1 VIEW  Result Date: 12/08/2021 CLINICAL DATA:  Hypoxia, hypertension, stage 3 chronic kidney disease EXAM: PORTABLE CHEST 1 VIEW COMPARISON:  None. FINDINGS: Lungs are clear. No pneumothorax or pleural effusion. Cardiac size is within normal limits. Central pulmonary arteries appear enlarged suggesting changes of pulmonary arterial hypertension. No acute bone abnormality. IMPRESSION: No radiographic evidence of acute cardiopulmonary disease. Central pulmonary arterial enlargement suggesting changes of pulmonary arterial hypertension. Electronically Signed   By: Fidela Salisbury M.D.   On: 12/08/2021 22:20   CT ABDOMEN PELVIS W CONTRAST  Result Date: 12/08/2021 CLINICAL DATA:  Acute and nonlocalized abdominal pain EXAM: CT ABDOMEN AND PELVIS WITH CONTRAST TECHNIQUE: Multidetector CT imaging of the abdomen and pelvis was performed using the standard protocol following bolus administration of intravenous contrast. CONTRAST:  23mL OMNIPAQUE IOHEXOL 300 MG/ML  SOLN COMPARISON:  03/25/2020 FINDINGS: Lower chest:  No contributory findings. Hepatobiliary: No focal liver  abnormality.Gallstones. Physiologically distended gallbladder and mildly dilated common bile duct (9 mm) with avid mucosal based enhancement of both structures. There is a 1 cm rounded structure in the distal CBD that matches the density of gallstones. Pancreas: No acute finding or ductal dilatation. Spleen: Unremarkable. Adrenals/Urinary Tract: Negative adrenals. No hydronephrosis or stone. Symmetric atrophy and cortical lobulation. Unremarkable bladder. Stomach/Bowel: No obstruction. No visible bowel inflammation. Distal colonic diverticula which are numerous. Vascular/Lymphatic: No acute vascular abnormality. Scattered atheromatous calcifications. No mass or adenopathy. Reproductive:Hysterectomy. Other: No ascites or pneumoperitoneum.  Small midline fatty hernia. Musculoskeletal: No acute abnormalities. Generalized thoracic and lumbar disc degeneration. Remote T12 compression fracture. IMPRESSION: Choledocholithiasis with distended gallbladder and common bile duct. Electronically Signed   By: Jorje Guild M.D.   On: 12/08/2021 07:58    Assessment & Plan:   Alexis Shelton was seen today for hypertension, hypothyroidism and hyperlipidemia.  Diagnoses and all orders for this visit:  Hyperlipidemia LDL goal <100- Will start a statin for CV risk reduction.. -     Lipid panel; Future -     Lipid panel  Essential hypertension- Her blood pressure is well controlled. -     amLODipine (NORVASC) 5 MG tablet; Take 1 tablet (5 mg total) by mouth daily.  Need for prophylactic  vaccination and inoculation against varicella -     Zoster Vaccine Adjuvanted [facility]) injection; Inject 0.5 mLs into the muscle once for 1 dose.  Hypothyroidism, unspecified type- She is euthyroid. -     TSH; Future -     TSH  Uncontrolled daytime somnolence -     modafinil (PROVIGIL) 100 MG tablet; Take 1 tablet (100 mg total) by mouth daily.  Other orders -     LDL cholesterol, direct   I am having Alexis Shelton start on  Shingrix and modafinil. I am also having her maintain her aspirin EC, albuterol, multivitamin, budesonide-formoterol, metoprolol tartrate, omeprazole, montelukast, allopurinol, levothyroxine, traMADol, and amLODipine.  Meds ordered this encounter  Medications   amLODipine (NORVASC) 5 MG tablet    Sig: Take 1 tablet (5 mg total) by mouth daily.    Dispense:  90 tablet    Refill:  1   Zoster Vaccine Adjuvanted [facility]) injection    Sig: Inject 0.5 mLs into the muscle once for 1 dose.    Dispense:  0.5 mL    Refill:  1   modafinil (PROVIGIL) 100 MG tablet    Sig: Take 1 tablet (100 mg total) by mouth daily.    Dispense:  90 tablet    Refill:  0     Follow-up: Return in about 6 months (around 12/06/2022).  Scarlette Calico, MD

## 2022-06-07 ENCOUNTER — Encounter: Payer: Self-pay | Admitting: Internal Medicine

## 2022-06-08 MED ORDER — ROSUVASTATIN CALCIUM 10 MG PO TABS: 10.0000 mg | ORAL_TABLET | Freq: Every day | ORAL | 1 refills | Status: DC

## 2022-06-08 NOTE — Addendum Note (Signed)
Addended by: Etta Grandchild on: 06/08/2022 01:19 PM   Modules accepted: Orders

## 2022-06-14 ENCOUNTER — Telehealth: Payer: Self-pay | Admitting: Internal Medicine

## 2022-06-14 NOTE — Telephone Encounter (Signed)
Patient called in stating that she had missed a call from the office. She thinks it might have been in reference to a letter she has requested from Dr. Yetta Barre. States that she will be in office on the 26th for her AWV and it would be convenient to pick it up when she is here.

## 2022-06-18 ENCOUNTER — Ambulatory Visit (INDEPENDENT_AMBULATORY_CARE_PROVIDER_SITE_OTHER): Payer: Medicare Other

## 2022-06-18 VITALS — BP 146/62 | HR 55 | Temp 97.7°F | Ht 66.0 in | Wt 172.0 lb

## 2022-06-18 DIAGNOSIS — Z0000 Encounter for general adult medical examination without abnormal findings: Secondary | ICD-10-CM

## 2022-06-18 DIAGNOSIS — E2839 Other primary ovarian failure: Secondary | ICD-10-CM

## 2022-06-18 DIAGNOSIS — Z599 Problem related to housing and economic circumstances, unspecified: Secondary | ICD-10-CM

## 2022-06-18 DIAGNOSIS — Z13820 Encounter for screening for osteoporosis: Secondary | ICD-10-CM | POA: Diagnosis not present

## 2022-06-25 ENCOUNTER — Telehealth: Payer: Self-pay

## 2022-06-25 NOTE — Telephone Encounter (Signed)
   Telephone encounter was:  Successful.  06/25/2022 Name: Alexis Shelton MRN: [id number] DOB: Mar 20, 1941  Alexis Shelton is a 81 y.o. year old female who is a primary care patient of Etta Grandchild, MD . The community resource team was consulted for assistance with Financial Difficulties related to bills  Care guide performed the following interventions: Patient provided with information about care guide support team and interviewed to confirm resource needs. Patient requested a call back for Friday in order to gather everything she may need assistance with at this time.  Follow Up Plan:  Care guide will follow up with patient by phone over the next few days.  [facility] Guide, Embedded Care Coordination [facility]  [place], [place] [address]  Main Phone: [phone]  E-mail: [email or web address]  Website: [email or web address]

## 2022-06-29 ENCOUNTER — Other Ambulatory Visit: Payer: Self-pay | Admitting: Internal Medicine

## 2022-06-29 ENCOUNTER — Encounter: Payer: Self-pay | Admitting: Internal Medicine

## 2022-06-29 DIAGNOSIS — Z1231 Encounter for screening mammogram for malignant neoplasm of breast: Secondary | ICD-10-CM

## 2022-06-29 NOTE — Telephone Encounter (Signed)
Pt was advised that she needs a PA for  modafinil.  Please advise

## 2022-07-04 ENCOUNTER — Telehealth: Payer: Self-pay

## 2022-07-04 ENCOUNTER — Other Ambulatory Visit: Payer: Self-pay | Admitting: Internal Medicine

## 2022-07-04 NOTE — Telephone Encounter (Signed)
   Telephone encounter was:  Unsuccessful.  07/04/2022 Name: Alexis Shelton MRN: [id number] DOB: 01-Mar-1941  Unsuccessful outbound call made today to assist with:  Financial Difficulties related to bills  Outreach Attempt:  1st Attempt  Patient asked if I could call her back this afternoon as she is currently busy at this time.  [facility] Guide, Embedded Care Coordination [facility]  [place], [place] [address]  Main Phone: [phone]  E-mail: [name]  Website: [email or web address]

## 2022-07-04 NOTE — Telephone Encounter (Signed)
Never received PA on med in cover-my-meds, nor PA fax. Will proceed w/ PA.Marland KitchenShearon Stalls

## 2022-07-04 NOTE — Telephone Encounter (Signed)
   Telephone encounter was:  Unsuccessful.  07/04/2022 Name: Alexis Shelton MRN: [id number] DOB: 1941/07/26  Unsuccessful outbound call made today to assist with:  Financial Difficulties related to bills  Outreach Attempt:  2nd Attempt  Patient requested call back around 2pm. CG called back and patient advised she is still busy and not a good time. Patient requested a new callback for tomorrow 7/13. CG will call pt 7/13.  [facility] Guide, Embedded Care Coordination [facility]  [place], [place] [address]  Main Phone: [phone]  E-mail: [email or web address]  Website: [email or web address]

## 2022-07-05 ENCOUNTER — Telehealth: Payer: Self-pay

## 2022-07-05 NOTE — Telephone Encounter (Addendum)
   Telephone encounter was:  Unsuccessful.  07/05/2022 Name: Raeanne Deschler MRN: [id number] DOB: 12-16-1941  Unsuccessful outbound call made today to assist with:  Financial Difficulties related to bills  Outreach Attempt:  3rd Attempt.  Referral closed unable to contact patient.  A HIPAA compliant voice message was left requesting a return call.  Instructed patient to call back at [phone] at their earliest convenience.  CG left messages on patient's cell and home number. Patient requested a call back for today and I was unable to reach her. Patient has requested CG to call back on: 7/3-patient requested call back. 7/12-patient requested callback in afternoon. 7/12 afternoon-patient requested callback for next day. 7/13-callback returned-patient does not answer cell or home phone.  [facility] Guide, Embedded Care Coordination [facility]  [place], [place] [address]  Main Phone: [phone]  E-mail: [email or web address]  Website: [email or web address]

## 2022-07-06 NOTE — Telephone Encounter (Signed)
Sent PA w/ (Key: [id number]) . Rec'd msg med has been APPROVED. This approval is for 07/05/2022 until further notice. This drug has been approved under the Member's Medicare Part D benefit. Approved  quantity: 90 tablets per 90 day(s). You may fill up to a 90 day supply except for those on Specialty Tier 5, which can be filled up to a 30 day supply. faxed APPROVAL TO POF.Marland Kitchen/LMB

## 2022-08-02 ENCOUNTER — Ambulatory Visit: Payer: Medicare Other

## 2022-08-05 ENCOUNTER — Other Ambulatory Visit: Payer: Self-pay | Admitting: Family Medicine

## 2022-08-05 ENCOUNTER — Other Ambulatory Visit: Payer: Self-pay | Admitting: Internal Medicine

## 2022-08-05 DIAGNOSIS — I10 Essential (primary) hypertension: Secondary | ICD-10-CM

## 2022-08-05 DIAGNOSIS — E039 Hypothyroidism, unspecified: Secondary | ICD-10-CM

## 2022-08-07 ENCOUNTER — Telehealth: Payer: Self-pay

## 2022-08-07 ENCOUNTER — Telehealth: Payer: Self-pay | Admitting: *Deleted

## 2022-08-07 NOTE — Chronic Care Management (AMB) (Signed)
  Care Coordination   Note   08/07/2022 Name: Alexis Shelton MRN: [id number] DOB: October 15, 1941  Alexis Shelton is a 81 y.o. year old female who sees Etta Grandchild, MD for primary care. I reached out to [name] by phone today to offer care coordination services.  Alexis Shelton was given information about Care Coordination services today including:   The Care Coordination services include support from the care team which includes your Nurse Coordinator, Clinical Social Worker, or Pharmacist.  The Care Coordination team is here to help remove barriers to the health concerns and goals most important to you. Care Coordination services are voluntary, and the patient may decline or stop services at any time by request to their care team member.   Care Coordination Consent Status: Patient agreed to services and verbal consent obtained.   Follow up plan:  Telephone appointment with care coordination team member scheduled for:  08/09/2022  Encounter Outcome:  Pt. Scheduled  [name], CCMA Care Coordination Care Guide Direct Dial: [phone]

## 2022-08-07 NOTE — Telephone Encounter (Addendum)
   Telephone encounter was:  Successful.  08/07/2022 Name: Alexis Shelton MRN: [id number] DOB: 12-Jun-1941  Alexis Shelton is a 81 y.o. year old female who is a primary care patient of Etta Grandchild, MD . The community resource team was consulted for assistance with Financial Difficulties related to bills and SW Support  Care guide performed the following interventions: Patient provided with information about care guide support team and interviewed to confirm resource needs. Patient expressed she is looking for couseling as she is having concerns/issues with her depression. Patient is also needing assistance with rent. CG has sent over rent (financial) resources to patient by e-mail which she verbally stated she has received. CG will send a message out in order for patient to be scheduled with a SW for support with depression and [facility] which a SW also must assist on as well.  Follow Up Plan:  No further follow up planned at this time. The patient has been provided with needed resources.  [facility] Guide, Embedded Care Coordination [facility]  [place], [place] [address]  Main Phone: [phone]  E-mail: [email or web address]  Website: [email or web address]

## 2022-08-09 ENCOUNTER — Encounter: Payer: Self-pay | Admitting: Licensed Clinical Social Worker

## 2022-08-09 ENCOUNTER — Ambulatory Visit: Payer: Self-pay | Admitting: Licensed Clinical Social Worker

## 2022-08-09 NOTE — Patient Instructions (Signed)
     It was a pleasure speaking with you today. I am sorry you were unable to keep your phone appointment today.   per your request your phone appointment is scheduled with me on 08/13/2022 at  3:00  Sammuel Hines, [facility] Care Coordination  [facility] [phone]

## 2022-08-09 NOTE — Patient Outreach (Signed)
  Care Coordination   08/09/2022 Name: Alexis Shelton MRN: [id number] DOB: [birth year]   Care Coordination Outreach Attempts:  Contact was made with the patient today to offer care coordination services as a benefit of their health plan. The patient requested a return call on a later date.   Follow Up Plan:  Additional outreach attempts will be made to offer the patient care coordination information and services.   Encounter Outcome:  Pt. Scheduled 08/13/2022  Care Coordination Interventions Activated:  No   Care Coordination Interventions:  No, not indicated    Sammuel Hines, [facility] Care Coordination  [facility] [phone]

## 2022-08-10 ENCOUNTER — Ambulatory Visit: Payer: Medicare Other | Admitting: Podiatry

## 2022-08-13 ENCOUNTER — Ambulatory Visit: Payer: Self-pay | Admitting: Licensed Clinical Social Worker

## 2022-08-13 NOTE — Patient Instructions (Signed)
Visit Information  Thank you for taking time to visit with me today. Please don't hesitate to contact me if I can be of assistance to you.   Following are the goals we discussed today:   Goals Addressed             This Visit's Progress    Connect for counseling       Care Coordination Interventions: Solution-Focused Strategies employed:  Active listening / Reflection utilized  Participation in counseling encouraged  Discussed referral to mental health providers based on insurance and need [facility] [address] [place], [place] [address] Phone [phone] [email or web address]     [facility] [address] [place], [place] [address] [email or web address]  Phone: [phone]       Our next appointment is by telephone on 8/28 at 11:00  Please call the care guide team at [phone] if you need to cancel or reschedule your appointment.   If you are experiencing a Mental Health or Behavioral Health Crisis or need someone to talk to, please call the Suicide and Crisis Lifeline: 988 call the [place] National Suicide Prevention Lifeline: 801 006 5750 or TTY: (413) 084-6389 TTY 734-395-9730) to talk to a trained counselor call 1-800-273-TALK (toll free, 24 hour hotline) go to [facility] [address], [place] [phone])   Patient verbalizes understanding of instructions and care plan provided today and agrees to view in MyChart. Active MyChart status and patient understanding of how to access instructions and care plan via MyChart confirmed with patient.      Sammuel Hines, LCSW Care Coordination  [facility] [phone]

## 2022-08-13 NOTE — Patient Outreach (Signed)
  Care Coordination  Initial Visit Note   08/13/2022 Name: Alexis Shelton MRN: [id number] DOB: 08/10/1941  Alexis Shelton is a 81 y.o. year old female who sees Etta Grandchild, MD for primary care. I spoke with  Alexis Shelton by phone today  What matters to the patients health and wellness today?  Connecting for therapy to manage stress  Patient is experiencing symptoms of  stress which seems to be exacerbated by life transitions of finding her new normal and managing ongoing relationship difficulties with her son .Marland Kitchen   Recommendation: Patient may benefit from, and is in agreement to To review the web sites of the agencies discussed today and call to schedule a therapy appointment.     Goals Addressed             This Visit's Progress    Connect for counseling       Care Coordination Interventions: Solution-Focused Strategies employed:  Active listening / Reflection utilized  Participation in counseling encouraged  Discussed referral to mental health providers based on insurance and need [facility] [address] [place], [place] [address] Phone [phone] [email or web address]     [facility] [address] [place], [place] [address] [email or web address]  Phone: [phone]       SDOH assessments and interventions completed:  Yes  SDOH Interventions Today    Flowsheet Row Most Recent Value  SDOH Interventions   SDOH Interventions for the Following Domains Depression, Food Insecurity, Housing, Transportation, Chief Operating Officer Strain Interventions Intervention Not Indicated  [received rental assistance from [place] [employer] [place]]  Housing Interventions Intervention Not Indicated  [looking for afforable housing/ resources provided by careguide]  Transportation Interventions Intervention Not Indicated  Depression Interventions/Treatment  Counseling  [counseling resources provided]        Care Coordination Interventions Activated:  Yes  Care Coordination Interventions:  Yes, provided   Follow up plan: Follow up call scheduled for 08/20/22      Encounter Outcome:  Pt. Visit Completed   Sammuel Hines, [facility] Care Coordination  [facility] [phone]

## 2022-08-20 ENCOUNTER — Ambulatory Visit: Payer: Self-pay | Admitting: Licensed Clinical Social Worker

## 2022-08-20 NOTE — Patient Outreach (Signed)
  Care Coordination   Follow Up Visit Note   08/20/2022 Name: Alexis Shelton MRN: [id number] DOB: December 03, 1941  Alexis Shelton is a 81 y.o. year old female who sees Etta Grandchild, MD for primary care. I spoke with  [name] by phone today.  What matters to the patients health and wellness today?  Getting to next appointment and finding new housing    Goals Addressed             This Visit's Progress    Connect for counseling       Care Coordination Interventions: Solution-Focused Strategies employed:  Active listening / Reflection utilized  Participation in counseling encouraged  Discussed referral to mental health providers based on insurance and need Assessed barriers to care [facility] [address] [place], [place] [address] Phone [phone] [email or web address]     [facility] [address] [place], [place] [address] [email or web address]  Phone: [phone]        SDOH assessments and interventions completed:  No    Care Coordination Interventions Activated:  Yes  Care Coordination Interventions:  Yes, provided   Follow up plan: Follow up call scheduled for 08/22/22    Encounter Outcome:  Pt. Visit Completed   Sammuel Hines, [facility] Care Coordination  [facility] [phone]

## 2022-08-20 NOTE — Patient Instructions (Signed)
Visit Information  Thank you for taking time to visit with me today. Please don't hesitate to contact me if I can be of assistance to you.   Following are the goals we discussed today:   Goals Addressed             This Visit's Progress    Connect for counseling       Care Coordination Interventions: Solution-Focused Strategies employed:  Active listening / Reflection utilized  Participation in counseling encouraged  Discussed referral to mental health providers based on insurance and need Assessed barriers to care [facility] [address] [place], [place] [address] Phone [phone] [email or web address]     [facility] [address] [place], [place] [address] [email or web address]  Phone: [phone]        Our next appointment is by telephone on 08/22/2022 at 10:00  Please call the care guide team at [phone] if you need to cancel or reschedule your appointment.   If you are experiencing a Mental Health or Behavioral Health Crisis or need someone to talk to, please call 1-800-273-TALK (toll free, 24 hour hotline)   Patient verbalizes understanding of instructions and care plan provided today and agrees to view in MyChart. Active MyChart status and patient understanding of how to access instructions and care plan via MyChart confirmed with patient.     Sammuel Hines, LCSW Care Coordination  [facility] [phone]

## 2022-08-22 ENCOUNTER — Encounter: Payer: Self-pay | Admitting: Licensed Clinical Social Worker

## 2022-08-29 ENCOUNTER — Ambulatory Visit: Payer: Self-pay | Admitting: Licensed Clinical Social Worker

## 2022-08-29 NOTE — Patient Outreach (Signed)
  Care Coordination   Follow Up Visit Note   08/29/2022 Name: Alexis Shelton MRN: [id number] DOB: 1941-01-29  Alexis Shelton is a 81 y.o. year old female who sees Alexis Grandchild, MD for primary care. I spoke with  Alexis Shelton by phone today.  What matters to the patients health and wellness today?  Getting her bills in order Patient is making progress with managing her stress and has decided not to move .  Concerned with co-pay for counseling and has not called to schedule appointment Recommendation: Patient may benefit from, and is in agreement to Call [facility] Medicare to inquire about her co-pay.    Goals Addressed             This Visit's Progress    Connect for counseling       Care Coordination Interventions: Solution-Focused Strategies employed:  Active listening / Reflection utilized  Participation in counseling encouraged  Discussed referral to mental health providers based on insurance and need Assessed barriers to connecting to counseling related to co-pay  [facility] [address] [place], [place] [address] Phone [phone] [email or web address]    [facility] [address] [place], [place] [address] [email or web address]  Phone: [phone]        SDOH assessments and interventions completed:  No   Care Coordination Interventions Activated:  Yes  Care Coordination Interventions:  Yes, provided   Follow up plan: Follow up call scheduled for 09/19/22    Encounter Outcome:  Pt. Visit Completed   Sammuel Hines, [facility] Care Coordination  [facility] [phone]

## 2022-08-29 NOTE — Patient Instructions (Signed)
Visit Information  Thank you for taking time to visit with me today. Please don't hesitate to contact me if I can be of assistance to you.   Following are the goals we discussed today:   Goals Addressed             This Visit's Progress    Connect for counseling       Care Coordination Interventions: Solution-Focused Strategies employed:  Active listening / Reflection utilized  Participation in counseling encouraged  Discussed referral to mental health providers based on insurance and need Assessed barriers to connecting to counseling related to co-pay  [facility] [address] [place], [place] [address] Phone [phone] [email or web address]    [facility] [address] [place], [place] [address] [email or web address]  Phone: [phone]       Our next appointment is by telephone on 09/19/22 at 1:15  Please call the care guide team at [phone] if you need to cancel or reschedule your appointment.   If you are experiencing a Mental Health or Behavioral Health Crisis or need someone to talk to, please call the Suicide and Crisis Lifeline: 988 call the [place] National Suicide Prevention Lifeline: 224-281-8940 or TTY: 2318575805 TTY 208-315-9757) to talk to a trained counselor call 1-800-273-TALK (toll free, 24 hour hotline) go to [facility] [address], [place] [phone])   Patient verbalizes understanding of instructions and care plan provided today and agrees to view in MyChart. Active MyChart status and patient understanding of how to access instructions and care plan via MyChart confirmed with patient.     Sammuel Hines, LCSW Care Coordination  [facility] [phone]

## 2022-09-06 ENCOUNTER — Ambulatory Visit
Admission: RE | Admit: 2022-09-06 | Discharge: 2022-09-06 | Disposition: A | Discharge status: Home / Self Care | Payer: Medicare HMO | Source: Ambulatory Visit | Attending: Internal Medicine | Admitting: Internal Medicine

## 2022-09-06 DIAGNOSIS — Z1231 Encounter for screening mammogram for malignant neoplasm of breast: Secondary | ICD-10-CM

## 2022-09-07 ENCOUNTER — Other Ambulatory Visit: Payer: Self-pay | Admitting: Family Medicine

## 2022-09-11 ENCOUNTER — Other Ambulatory Visit: Payer: Self-pay | Admitting: Internal Medicine

## 2022-09-11 DIAGNOSIS — E785 Hyperlipidemia, unspecified: Secondary | ICD-10-CM

## 2022-09-17 DIAGNOSIS — N1832 Chronic kidney disease, stage 3b: Secondary | ICD-10-CM | POA: Diagnosis not present

## 2022-09-19 ENCOUNTER — Ambulatory Visit: Payer: Self-pay | Admitting: Licensed Clinical Social Worker

## 2022-09-19 NOTE — Patient Instructions (Signed)
Visit Information  Thank you for taking time to visit with me today. Please don't hesitate to contact me if I can be of assistance to you.   Following are the goals we discussed today:   Goals Addressed             This Visit's Progress    Care Coordination Activities       Care Coordination Interventions: Solution-Focused Strategies employed:  Mindfulness or Relaxation training provided Active listening / Reflection utilized  Emotional Support Provided Problem [place] strategies reviewed Provided EMMI education information on :Breathing to Relax and Managing Anxiety around daily task           Our next appointment is by telephone on 10/16/22 at 1:15  Please call the care guide team at [phone] if you need to cancel or reschedule your appointment.   If you are experiencing a Mental Health or [place] or need someone to talk to, please call the Suicide and Crisis Lifeline: 988 call the [place] National Suicide Prevention Lifeline: 7323246091 or TTY: 347-073-8711 TTY (206) 041-0693) to talk to a trained counselor call 1-800-273-TALK (toll free, 24 hour hotline) go to [facility] [address] [place] [phone])   Patient verbalizes understanding of instructions and care plan provided today and agrees to view in [place]. Active MyChart status and patient understanding of how to access instructions and care plan via MyChart confirmed with patient.     Casimer Lanius, [place] [phone]

## 2022-09-19 NOTE — Patient Outreach (Signed)
  Care Coordination  Follow Up Visit Note   09/19/2022 Name: Manila Rommel MRN: [id number] DOB: Jun 14, 1941  Nasiah Polinsky is a 81 y.o. year old female who sees Janith Lima, MD for primary care. I spoke with  [name] by phone today.  What matters to the patients health and wellness today?  Managing stress Patient continues to experience difficulty with managing her stress; she is unable to go to ongoing therapy due to not being able to afford the co-pay..   Recommendation: Patient may benefit from, and is in agreement to implement interventions discussed today for relaxed breathing and review EMMI video.    Goals Addressed             This Visit's Progress    Care Coordination Activities       Care Coordination Interventions: Solution-Focused Strategies employed:  Mindfulness or Relaxation training provided Active listening / Reflection utilized  Emotional Support Provided Problem [place] strategies reviewed Provided EMMI education information on :Breathing to Relax and Managing Anxiety around daily task           SDOH assessments and interventions completed:  Yes  SDOH Interventions Today    Flowsheet Row Most Recent Value  SDOH Interventions   Stress Interventions Provide Counseling        Care Coordination Interventions Activated:  Yes  Care Coordination Interventions:  Yes, provided   Follow up plan: Follow up call scheduled for 10/16/2022    Encounter Outcome:  Pt. Visit Completed   Casimer Lanius, [place] [phone]

## 2022-09-24 ENCOUNTER — Ambulatory Visit: Payer: Medicare Other | Admitting: Podiatry

## 2022-09-27 ENCOUNTER — Telehealth: Payer: Self-pay | Admitting: Internal Medicine

## 2022-09-27 DIAGNOSIS — I129 Hypertensive chronic kidney disease with stage 1 through stage 4 chronic kidney disease, or unspecified chronic kidney disease: Secondary | ICD-10-CM | POA: Diagnosis not present

## 2022-09-27 DIAGNOSIS — M1990 Unspecified osteoarthritis, unspecified site: Secondary | ICD-10-CM | POA: Diagnosis not present

## 2022-09-27 DIAGNOSIS — N1832 Chronic kidney disease, stage 3b: Secondary | ICD-10-CM | POA: Diagnosis not present

## 2022-09-27 NOTE — Telephone Encounter (Signed)
Form given to PCP to review in case pt needs an OV prior to completion.

## 2022-09-27 NOTE — Telephone Encounter (Signed)
Patient dropped off a form for provider to fill out and said she needs done as soon as possible and faxed to the number on the envelope. She would like a call when done at [phone]. I placed in providers box up front

## 2022-10-02 ENCOUNTER — Encounter: Payer: Self-pay | Admitting: Internal Medicine

## 2022-10-02 ENCOUNTER — Ambulatory Visit (INDEPENDENT_AMBULATORY_CARE_PROVIDER_SITE_OTHER): Payer: Medicare HMO | Admitting: Internal Medicine

## 2022-10-02 VITALS — BP 118/72 | HR 68 | Temp 98.2°F | Ht 66.0 in | Wt 173.0 lb

## 2022-10-02 DIAGNOSIS — E039 Hypothyroidism, unspecified: Secondary | ICD-10-CM | POA: Diagnosis not present

## 2022-10-02 DIAGNOSIS — M1A09X0 Idiopathic chronic gout, multiple sites, without tophus (tophi): Secondary | ICD-10-CM | POA: Diagnosis not present

## 2022-10-02 DIAGNOSIS — R001 Bradycardia, unspecified: Secondary | ICD-10-CM | POA: Diagnosis not present

## 2022-10-02 DIAGNOSIS — R7303 Prediabetes: Secondary | ICD-10-CM

## 2022-10-02 DIAGNOSIS — M069 Rheumatoid arthritis, unspecified: Secondary | ICD-10-CM | POA: Diagnosis not present

## 2022-10-02 DIAGNOSIS — N1832 Chronic kidney disease, stage 3b: Secondary | ICD-10-CM | POA: Diagnosis not present

## 2022-10-02 DIAGNOSIS — I10 Essential (primary) hypertension: Secondary | ICD-10-CM

## 2022-10-02 DIAGNOSIS — Z23 Encounter for immunization: Secondary | ICD-10-CM

## 2022-10-02 LAB — BASIC METABOLIC PANEL
BUN: 18 (ref 4–21)
CO2: 22 (ref 13–22)
Chloride: 105 (ref 99–108)
Creatinine: 1.2 — AB (ref 0.5–1.1)
Glucose: 80

## 2022-10-02 LAB — COMPREHENSIVE METABOLIC PANEL
Albumin: 4.3 (ref 3.5–5.0)
Calcium: 9.5 (ref 8.7–10.7)
eGFR: 44

## 2022-10-02 NOTE — Patient Instructions (Signed)
Bradycardia, Adult Bradycardia is a slower-than-normal heartbeat. A normal resting heart rate for an adult ranges from 60 to 100 beats per minute. With bradycardia, the resting heart rate is less than 60 beats per minute. Bradycardia can prevent enough oxygen from reaching certain areas of your body when you are active. It can be serious if it keeps enough oxygen from reaching your brain and other parts of your body. Bradycardia is not a problem for everyone. For some healthy adults, a slow resting heart rate is normal. What are the causes? This condition may be caused by: A problem with the heart, including: A problem with the heart's electrical system, such as a heart block. With a heart block, electrical signals between the chambers of the heart are partially or completely blocked, so they are not able to work as they should. A problem with the heart's natural pacemaker (sinus node). Heart disease. A heart attack. Heart damage. Lyme disease. A heart infection. A heart condition that is present at birth (congenital heart defect). Certain medicines that treat heart conditions. Certain conditions, such as hypothyroidism and obstructive sleep apnea. Problems with the balance of chemicals and other substances, like potassium, in the blood. Trauma. Radiation therapy. What increases the risk? You are more likely to develop this condition if you: Are age 65 or older. Have high blood pressure (hypertension), high cholesterol (hyperlipidemia), or diabetes. Drink heavily, use tobacco or nicotine products, or use drugs. What are the signs or symptoms? Symptoms of this condition include: Light-headedness. Feeling faint or fainting. Fatigue and weakness. Trouble with activity or exercise. Shortness of breath. Chest pain (angina). Drowsiness. Confusion. Dizziness. How is this diagnosed? This condition may be diagnosed based on: Your symptoms. Your medical history. A physical exam. During  the exam, your health care provider will listen to your heartbeat and check your pulse. To confirm the diagnosis, your health care provider may order tests, such as: Blood tests. An electrocardiogram (ECG). This test records the heart's electrical activity. The test can show how fast your heart is beating and whether the heartbeat is steady. A test in which you wear a portable device (event recorder or Holter monitor) to record your heart's electrical activity while you go about your day. An exercise test. How is this treated? Treatment for this condition depends on the cause of the condition and how severe your symptoms are. Treatment may involve: Treatment of the underlying condition. Changing your medicines or how much medicine you take. Having a small, battery-operated device called a pacemaker implanted under the skin. When bradycardia occurs, this device can be used to increase your heart rate and help your heart beat in a regular rhythm. Follow these instructions at home: Lifestyle Manage any health conditions that contribute to bradycardia as told by your health care provider. Follow a heart-healthy diet. A nutrition specialist (dietitian) can help educate you about healthy food options and changes. Follow an exercise program that is approved by your health care provider. Maintain a healthy weight. Try to reduce or manage your stress, such as with yoga or meditation. If you need help reducing stress, ask your health care provider. Do not use any products that contain nicotine or tobacco. These products include cigarettes, chewing tobacco, and vaping devices, such as e-cigarettes. If you need help quitting, ask your health care provider. Do not use illegal drugs. Alcohol use If you drink alcohol: Limit how much you have to: 0-1 drink a day for women who are not pregnant. 0-2 drinks a day   for men. Know how much alcohol is in a drink. In the [place] one drink equals one 12 oz bottle of  beer (355 mL), one 5 oz glass of wine (148 mL), or one 1 oz glass of hard liquor (44 mL). General instructions Take over-the-counter and prescription medicines only as told by your health care provider. Keep all follow-up visits. This is important. How is this prevented? In some cases, bradycardia may be prevented by: Treating underlying medical problems. Stopping behaviors or medicines that can trigger the condition. Contact a health care provider if: You feel light-headed or dizzy. You almost faint. You feel weak or are easily fatigued during physical activity. You experience confusion or have memory problems. Get help right away if: You faint. You have chest pains or an irregular heartbeat (palpitations). You have trouble breathing. These symptoms may represent a serious problem that is an emergency. Do not wait to see if the symptoms will go away. Get medical help right away. Call your local emergency services (911 in the [place] Do not drive yourself to the hospital. Summary Bradycardia is a slower-than-normal heartbeat. With bradycardia, the resting heart rate is less than 60 beats per minute. Treatment for this condition depends on the cause. Manage any health conditions that contribute to bradycardia as told by your health care provider. Do not use any products that contain nicotine or tobacco. These products include cigarettes, chewing tobacco, and vaping devices, such as e-cigarettes. Keep all follow-up visits. This is important. This information is not intended to replace advice given to you by your health care provider. Make sure you discuss any questions you have with your health care provider. Document Revised: 04/02/2021 Document Reviewed: 04/02/2021 Elsevier Patient Education  2023 [employer]  

## 2022-10-02 NOTE — Progress Notes (Signed)
Subjective:  Patient ID: Alexis Shelton, female    DOB: 1941-08-01  Age: 81 y.o. MRN: [id number]  CC: Hypothyroidism and Hypertension   HPI Alexis Shelton presents for f/up -  She continues to complain of syncopal episodes that occur at rest.  She has dizziness and lightheadedness.  She is in a gout research study and during one of her recent visits she was told that her heart rate was 48.  She is only taking metoprolol once a day.  She denies chest pain, shortness of breath, or diaphoresis.  Outpatient Medications Prior to Visit  Medication Sig Dispense Refill   albuterol (VENTOLIN HFA) 108 (90 Base) MCG/ACT inhaler Inhale 2 puffs into the lungs every 6 (six) hours as needed for wheezing or shortness of breath. 54 g 3   aspirin 81 MG EC tablet Take 81 mg by mouth daily. Swallow whole.     budesonide-formoterol (SYMBICORT) 160-4.5 MCG/ACT inhaler Inhale 2 puffs into the lungs as needed. (Patient taking differently: Inhale 2 puffs into the lungs daily as needed (sob/wheezing).) 30.6 g 3   montelukast (SINGULAIR) 10 MG tablet TAKE 1 TABLET BY MOUTH EVERYDAY AT BEDTIME 90 tablet 1   Multiple Vitamin (MULTIVITAMIN) tablet Take 1 tablet by mouth daily.     rosuvastatin (CRESTOR) 10 MG tablet TAKE 1 TABLET BY MOUTH EVERY DAY 90 tablet 1   allopurinol (ZYLOPRIM) 100 MG tablet TAKE 1 TABLET BY MOUTH EVERY DAY 90 tablet 1   amLODipine (NORVASC) 5 MG tablet TAKE 1 TABLET (5 MG TOTAL) BY MOUTH DAILY. 90 tablet 1   levothyroxine (SYNTHROID) 88 MCG tablet TAKE 1 TABLET DAILY BEFORE BREAKFAST 30 tablet 2   metoprolol tartrate (LOPRESSOR) 25 MG tablet TAKE 1 TABLET BY MOUTH TWICE A DAY 180 tablet 0   modafinil (PROVIGIL) 100 MG tablet Take 1 tablet (100 mg total) by mouth daily. 90 tablet 0   omeprazole (PRILOSEC) 40 MG capsule TAKE 1 CAPSULE (40 MG TOTAL) BY MOUTH DAILY 30 MINS BEFORE BREAKFAST 90 capsule 3   traMADol (ULTRAM) 50 MG tablet Take 1 tablet (50 mg total) by mouth every 6 (six)  hours as needed. 90 tablet 3   No facility-administered medications prior to visit.    ROS Review of Systems  Constitutional: Negative.   HENT: Negative.    Eyes: Negative.   Respiratory:  Negative for cough, chest tightness, shortness of breath and wheezing.   Cardiovascular:  Negative for chest pain, palpitations and leg swelling.  Gastrointestinal:  Negative for abdominal pain, diarrhea and nausea.  Endocrine: Negative.   Genitourinary: Negative.  Negative for difficulty urinating.  Musculoskeletal:  Positive for arthralgias, back pain and gait problem. Negative for myalgias.  Skin: Negative.   Neurological:  Positive for dizziness, syncope and light-headedness. Negative for weakness.  Hematological:  Negative for adenopathy. Does not bruise/bleed easily.  Psychiatric/Behavioral: Negative.      Objective:  BP 118/72 (BP Location: Left Arm, Patient Position: Sitting, Cuff Size: Large)   Pulse 68   Temp 98.2 F (36.8 C) (Oral)   Ht 5\' 6"  (1.676 m)   Wt 173 lb (78.5 kg)   SpO2 90%   BMI 27.92 kg/m   BP Readings from Last 3 Encounters:  10/02/22 118/72  06/18/22 (!) 146/62  06/06/22 140/66    Wt Readings from Last 3 Encounters:  10/02/22 173 lb (78.5 kg)  06/18/22 172 lb (78 kg)  06/06/22 173 lb (78.5 kg)    Physical Exam Vitals reviewed.  HENT:  Mouth/Throat:     Mouth: Mucous membranes are moist.  Eyes:     General: No scleral icterus.    Conjunctiva/sclera: Conjunctivae normal.  Cardiovascular:     Rate and Rhythm: Normal rate and regular rhythm.     Heart sounds: No murmur heard. Pulmonary:     Effort: Pulmonary effort is normal.     Breath sounds: No stridor. No wheezing, rhonchi or rales.  Abdominal:     General: Abdomen is flat.     Palpations: There is no mass.     Tenderness: There is no abdominal tenderness. There is no guarding.     Hernia: No hernia is present.  Musculoskeletal:        General: Normal range of motion.     Cervical back:  Neck supple.     Right lower leg: No edema.     Left lower leg: No edema.  Lymphadenopathy:     Cervical: No cervical adenopathy.  Skin:    General: Skin is warm and dry.  Neurological:     General: No focal deficit present.     Mental Status: She is alert.  Psychiatric:        Mood and Affect: Mood normal.        Behavior: Behavior normal.     Lab Results  Component Value Date   WBC 6.9 05/30/2022   HGB 13.9 05/30/2022   HCT 42.3 05/30/2022   PLT 261 05/30/2022   GLUCOSE 104 (H) 05/30/2022   CHOL 260 (H) 06/06/2022   TRIG 249.0 (H) 06/06/2022   HDL 39.30 06/06/2022   LDLDIRECT 175.0 06/06/2022   LDLCALC 101 (H) 03/06/2021   ALT 16 05/30/2022   AST 19 05/30/2022   NA 143 05/30/2022   K 4.9 05/30/2022   CL 105 10/02/2022   CREATININE 1.2 (A) 10/02/2022   BUN 18 10/02/2022   CO2 22 10/02/2022   TSH 0.96 10/02/2022   INR 1.1 12/08/2021   HGBA1C 6.2 10/02/2022    MM 3D SCREEN BREAST BILATERAL  Result Date: 09/07/2022 CLINICAL DATA:  Screening. EXAM: DIGITAL SCREENING BILATERAL MAMMOGRAM WITH TOMOSYNTHESIS AND CAD TECHNIQUE: Bilateral screening digital craniocaudal and mediolateral oblique mammograms were obtained. Bilateral screening digital breast tomosynthesis was performed. The images were evaluated with computer-aided detection. COMPARISON:  Previous exam(s). ACR Breast Density Category b: There are scattered areas of fibroglandular density. FINDINGS: There are no findings suspicious for malignancy. IMPRESSION: No mammographic evidence of malignancy. A result letter of this screening mammogram will be mailed directly to the patient. RECOMMENDATION: Screening mammogram in one year. (Code:SM-B-01Y) BI-RADS CATEGORY  1: Negative. Electronically Signed   By: Abelardo Diesel M.D.   On: 09/07/2022 13:34    Assessment & Plan:   Alexis Shelton was seen today for hypothyroidism and hypertension.  Diagnoses and all orders for this visit:  Essential hypertension- Her blood pressure is  well controlled. -     Magnesium; Future -     Magnesium  Hypothyroidism, unspecified type- She is euthyroid. -     TSH; Future -     TSH  Stage 3b chronic kidney disease ([place])- Her renal function is stable.  Idiopathic chronic gout of multiple sites without tophus- She has achieved her uric acid goal. -     Uric acid; Future -     Uric acid  Prediabetes -     Hemoglobin A1c; Future -     Hemoglobin A1c  Symptomatic bradycardia- Will discontinue the beta-blocker and CCB. -  CARDIAC EVENT MONITOR; Future  Flu vaccine need -     Flu Vaccine QUAD High Dose(Fluad)   I have discontinued Alexis Shelton's amLODipine and metoprolol tartrate. I am also having her maintain her aspirin EC, albuterol, multivitamin, budesonide-formoterol, montelukast, and rosuvastatin.  No orders of the defined types were placed in this encounter.    Follow-up: Return in about 3 months (around 01/02/2023).  Alexis Calico, MD

## 2022-10-03 LAB — HEMOGLOBIN A1C: Hgb A1c MFr Bld: 6.2 % (ref 4.6–6.5)

## 2022-10-03 LAB — URIC ACID: Uric Acid, Serum: 5 mg/dL (ref 2.4–7.0)

## 2022-10-03 LAB — MAGNESIUM: Magnesium: 1.6 mg/dL (ref 1.5–2.5)

## 2022-10-03 LAB — TSH: TSH: 0.96 u[IU]/mL (ref 0.35–5.50)

## 2022-10-03 NOTE — Telephone Encounter (Signed)
Form was completed by PCP and faxed back during pt's 10/02/22 OV.

## 2022-10-04 ENCOUNTER — Telehealth: Payer: Self-pay | Admitting: Internal Medicine

## 2022-10-04 ENCOUNTER — Other Ambulatory Visit: Payer: Self-pay | Admitting: Internal Medicine

## 2022-10-04 NOTE — Telephone Encounter (Signed)
[facility] is requesting the following refills:  Levothyroxine, Omeprazole, allupurinol, modafinil, and the tramadol.  Please fax to [phone]

## 2022-10-05 ENCOUNTER — Other Ambulatory Visit: Payer: Self-pay | Admitting: Internal Medicine

## 2022-10-05 ENCOUNTER — Encounter: Payer: Self-pay | Admitting: Internal Medicine

## 2022-10-05 DIAGNOSIS — M1A09X0 Idiopathic chronic gout, multiple sites, without tophus (tophi): Secondary | ICD-10-CM

## 2022-10-05 DIAGNOSIS — M5030 Other cervical disc degeneration, unspecified cervical region: Secondary | ICD-10-CM

## 2022-10-05 DIAGNOSIS — M5136 Other intervertebral disc degeneration, lumbar region: Secondary | ICD-10-CM

## 2022-10-05 DIAGNOSIS — E039 Hypothyroidism, unspecified: Secondary | ICD-10-CM

## 2022-10-05 DIAGNOSIS — K2100 Gastro-esophageal reflux disease with esophagitis, without bleeding: Secondary | ICD-10-CM

## 2022-10-05 DIAGNOSIS — R400 Somnolence: Secondary | ICD-10-CM

## 2022-10-05 DIAGNOSIS — M19042 Primary osteoarthritis, left hand: Secondary | ICD-10-CM

## 2022-10-05 MED ORDER — TRAMADOL HCL 50 MG PO TABS: 50.0000 mg | ORAL_TABLET | Freq: Four times a day (QID) | ORAL | 0 refills | Status: DC | PRN

## 2022-10-05 MED ORDER — MODAFINIL 100 MG PO TABS: 100.0000 mg | ORAL_TABLET | Freq: Every day | ORAL | 0 refills | Status: DC

## 2022-10-05 MED ORDER — OMEPRAZOLE 40 MG PO CPDR: DELAYED_RELEASE_CAPSULE | ORAL | 0 refills | Status: DC

## 2022-10-05 MED ORDER — ALLOPURINOL 100 MG PO TABS: 100.0000 mg | ORAL_TABLET | Freq: Every day | ORAL | 0 refills | Status: DC

## 2022-10-05 MED ORDER — LEVOTHYROXINE SODIUM 88 MCG PO TABS: 88.0000 ug | ORAL_TABLET | Freq: Every day | ORAL | 0 refills | Status: DC

## 2022-10-13 ENCOUNTER — Ambulatory Visit: Payer: Medicare HMO | Attending: Internal Medicine

## 2022-10-13 DIAGNOSIS — R001 Bradycardia, unspecified: Secondary | ICD-10-CM | POA: Diagnosis not present

## 2022-10-16 ENCOUNTER — Ambulatory Visit: Payer: Self-pay | Admitting: Licensed Clinical Social Worker

## 2022-10-16 DIAGNOSIS — F439 Reaction to severe stress, unspecified: Secondary | ICD-10-CM

## 2022-10-16 NOTE — Patient Outreach (Signed)
  Care Coordination  Follow Up Visit Note   10/16/2022 Name: Negin Hegg MRN: [id number] DOB: Aug 07, 1941  Marlisa Caridi is a 81 y.o. year old female who sees Janith Lima, MD for primary care. I spoke with  [name] by phone today.  What matters to the patients health and wellness today?  Managing stress  Patient continues to experience stress which seems to be exacerbated by family dysfunction and financial strain..   Recommendation: Patient may benefit from, and is in agreement to Allow LCSW to make referral to [facility].     Goals Addressed             This Visit's Progress    Care Coordination Activities       Care Coordination Interventions: Solution-Focused Strategies employed:  Active listening / Reflection utilized  Emotional Support Provided Problem [place] strategies reviewed Participation in counseling encouraged : willing to explore understands co-pay is $20.00 Participation in support group encouraged : will look into [place] [facility] [phone] Provided EMMI education information on :Breathing to Relax and Managing Anxiety around daily task Discussed referral options to connect for ongoing therapy: based on need and insurance  Made referral to [employer]  (once approved by PCP)  Housing  (working to find new housing and looking into retirement communities)  Museum/gallery curator hardship resource options (provided information on [place] for senior employment [phone])         SDOH assessments and interventions completed:  No   Care Coordination Interventions Activated:  Yes  Care Coordination Interventions:  Yes, provided   Follow up plan: Follow up call scheduled for 30 days ; will f/u and check on   Encounter Outcome:  Pt. Visit Completed   Casimer Lanius, [place] [phone]

## 2022-10-16 NOTE — Patient Instructions (Signed)
Visit Information  Thank you for taking time to visit with me today. Please don't hesitate to contact me if I can be of assistance to you.   Following are the goals we discussed today:   Goals Addressed             This Visit's Progress    Care Coordination Activities       Care Coordination Interventions: Solution-Focused Strategies employed:  Active listening / Reflection utilized  Emotional Support Provided Problem [place] strategies reviewed Participation in counseling encouraged : willing to explore understands co-pay is $20.00 Participation in support group encouraged : will look into [place] [facility] [phone] Provided EMMI education information on :Breathing to Relax and Managing Anxiety around daily task Discussed referral options to connect for ongoing therapy: based on need and insurance  Made referral to [employer]  (once approved by PCP)  Housing  (working to find new housing and looking into retirement communities)  Museum/gallery curator hardship resource options (provided information on [place] for senior employment [phone])         [place] next appointment is by telephone on 11/19/22 at 1:15  Please call the care guide team at [phone] if you need to cancel or reschedule your appointment.   If you are experiencing a Mental Health or [place] or need someone to talk to, please call the Suicide and Crisis Lifeline: 988 call the [place] National Suicide Prevention Lifeline: 249-268-4207 or TTY: 443 039 0208 TTY 626-212-4146) to talk to a trained counselor call 1-800-273-TALK (toll free, 24 hour hotline) go to [facility] [address] [place] [phone])   Patient verbalizes understanding of instructions and care plan provided today and agrees to view in [place]. Active MyChart status and patient understanding of how to access instructions and care plan via MyChart  confirmed with patient.     Casimer Lanius, [place] [phone]

## 2022-10-20 ENCOUNTER — Other Ambulatory Visit: Payer: Self-pay | Admitting: Family Medicine

## 2022-10-20 DIAGNOSIS — E039 Hypothyroidism, unspecified: Secondary | ICD-10-CM

## 2022-10-23 ENCOUNTER — Other Ambulatory Visit: Payer: Self-pay | Admitting: Internal Medicine

## 2022-10-23 DIAGNOSIS — L209 Atopic dermatitis, unspecified: Secondary | ICD-10-CM

## 2022-10-23 MED ORDER — CLOBETASOL PROPIONATE 0.05 % EX SHAM: MEDICATED_SHAMPOO | CUTANEOUS | 2 refills | Status: DC

## 2022-10-26 ENCOUNTER — Other Ambulatory Visit: Payer: Self-pay | Admitting: Internal Medicine

## 2022-10-26 DIAGNOSIS — L209 Atopic dermatitis, unspecified: Secondary | ICD-10-CM

## 2022-10-26 MED ORDER — CLOBETASOL PROPIONATE 0.05 % EX SHAM: 1.0000 | MEDICATED_SHAMPOO | Freq: Every day | CUTANEOUS | 2 refills | Status: DC

## 2022-11-01 DIAGNOSIS — R110 Nausea: Secondary | ICD-10-CM | POA: Diagnosis not present

## 2022-11-01 DIAGNOSIS — I10 Essential (primary) hypertension: Secondary | ICD-10-CM | POA: Diagnosis not present

## 2022-11-01 DIAGNOSIS — R42 Dizziness and giddiness: Secondary | ICD-10-CM | POA: Diagnosis not present

## 2022-11-01 DIAGNOSIS — G4489 Other headache syndrome: Secondary | ICD-10-CM | POA: Diagnosis not present

## 2022-11-01 DIAGNOSIS — E860 Dehydration: Secondary | ICD-10-CM | POA: Diagnosis not present

## 2022-11-05 ENCOUNTER — Telehealth: Payer: Self-pay | Admitting: Internal Medicine

## 2022-11-05 NOTE — Telephone Encounter (Signed)
Scheduled for next week because patient wants to see PCP

## 2022-11-05 NOTE — Telephone Encounter (Signed)
Patient called and said that on 11/01/22 that she had an ambulance come out because she had a really bad headache and that she doesn't know what it was, she said they said she would probably have to wait about 12 hours to be admitted if she did go to the hospital so she decided not to go. She said she is feeling better. She said her memory is fuzzy from it and she would like to be checked out. Is it ok to work her in somewhere?

## 2022-11-06 ENCOUNTER — Telehealth: Payer: Self-pay | Admitting: Internal Medicine

## 2022-11-06 NOTE — Telephone Encounter (Signed)
Called pt, LVM to discus Sx.  Pt should keep OV on 11/20.

## 2022-11-06 NOTE — Telephone Encounter (Signed)
Pt called with memory loss. Severe migraine headache. Heartbeat fast. Ambulance came and assisted pt at her home on 11/01/22. Pt needs new referral to her neurologist for memory loss since recent episode on 11/01/22.  New Referral to Current Neuologist,  Dr. Levert Feinstein at [facility]  PHONE [phone] (Last saw Dr. Terrace Arabia 09/21/21)  Does she need to keep appt with Yetta Barre 11/20?  Call pt [phone]

## 2022-11-12 ENCOUNTER — Encounter: Payer: Self-pay | Admitting: Internal Medicine

## 2022-11-12 ENCOUNTER — Ambulatory Visit (INDEPENDENT_AMBULATORY_CARE_PROVIDER_SITE_OTHER): Payer: Medicare HMO | Admitting: Internal Medicine

## 2022-11-12 VITALS — BP 152/80 | HR 69 | Temp 98.1°F | Resp 16 | Ht 66.0 in

## 2022-11-12 DIAGNOSIS — G119 Hereditary ataxia, unspecified: Secondary | ICD-10-CM

## 2022-11-12 DIAGNOSIS — R519 Headache, unspecified: Secondary | ICD-10-CM | POA: Diagnosis not present

## 2022-11-12 DIAGNOSIS — R270 Ataxia, unspecified: Secondary | ICD-10-CM | POA: Insufficient documentation

## 2022-11-12 DIAGNOSIS — I10 Essential (primary) hypertension: Secondary | ICD-10-CM

## 2022-11-12 NOTE — Progress Notes (Signed)
Subjective:  Patient ID: Alexis Shelton, female    DOB: Aug 23, 1941  Age: 81 y.o. MRN: [id number]  CC: Hypertension and Headache   HPI Fiona Coto presents for f/up -  She tells me that 10 days ago she had the worst headache of her life.  She said she called EMS and they evaluated her and told her she did not need to go to the emergency department.  Since then she complains of dizziness, worsening ataxia, and declining memory.  She says at the time of the headache she had nausea, vomiting, and low-grade fever but those symptoms have resolved.  Outpatient Medications Prior to Visit  Medication Sig Dispense Refill   albuterol (VENTOLIN HFA) 108 (90 Base) MCG/ACT inhaler Inhale 2 puffs into the lungs every 6 (six) hours as needed for wheezing or shortness of breath. 54 g 3   allopurinol (ZYLOPRIM) 100 MG tablet Take 1 tablet (100 mg total) by mouth daily. 90 tablet 0   aspirin 81 MG EC tablet Take 81 mg by mouth daily. Swallow whole.     budesonide-formoterol (SYMBICORT) 160-4.5 MCG/ACT inhaler Inhale 2 puffs into the lungs daily as needed (sob/wheezing). 30.6 g 1   Clobetasol Propionate 0.05 % shampoo Apply 1 Act topically daily. 118 mL 2   levothyroxine (SYNTHROID) 88 MCG tablet TAKE 1 TABLET BY MOUTH EVERY DAY BEFORE BREAKFAST 30 tablet 1   modafinil (PROVIGIL) 100 MG tablet Take 1 tablet (100 mg total) by mouth daily. 90 tablet 0   montelukast (SINGULAIR) 10 MG tablet TAKE 1 TABLET BY MOUTH EVERYDAY AT BEDTIME 90 tablet 1   Multiple Vitamin (MULTIVITAMIN) tablet Take 1 tablet by mouth daily.     omeprazole (PRILOSEC) 40 MG capsule TAKE 1 CAPSULE (40 MG TOTAL) BY MOUTH DAILY 30 MINS BEFORE BREAKFAST 90 capsule 0   rosuvastatin (CRESTOR) 10 MG tablet TAKE 1 TABLET BY MOUTH EVERY DAY 90 tablet 1   traMADol (ULTRAM) 50 MG tablet Take 1 tablet (50 mg total) by mouth every 6 (six) hours as needed. 270 tablet 0   No facility-administered medications prior to visit.     ROS Review of Systems  Constitutional:  Positive for fatigue and fever. Negative for chills and diaphoresis.  HENT: Negative.    Eyes: Negative.  Negative for visual disturbance.  Respiratory: Negative.  Negative for cough, chest tightness, shortness of breath and wheezing.   Cardiovascular:  Negative for chest pain, palpitations and leg swelling.  Gastrointestinal:  Positive for nausea and vomiting. Negative for abdominal pain, constipation and diarrhea.  Endocrine: Negative.   Genitourinary: Negative.  Negative for difficulty urinating, dysuria, hematuria and urgency.  Musculoskeletal:  Positive for gait problem. Negative for arthralgias, myalgias and neck pain.  Skin: Negative.  Negative for color change and rash.  Allergic/Immunologic: Negative.   Neurological:  Positive for dizziness and headaches.  Hematological:  Negative for adenopathy. Does not bruise/bleed easily.  Psychiatric/Behavioral:  Positive for confusion. The patient is not nervous/anxious.     Objective:  BP (!) 152/80 (BP Location: Right Arm, Patient Position: Sitting, Cuff Size: Large)   Pulse 69   Temp 98.1 F (36.7 C) (Oral)   Resp 16   Ht 5\' 6"  (1.676 m)   SpO2 90%   BMI 27.92 kg/m   BP Readings from Last 3 Encounters:  11/12/22 (!) 152/80  10/02/22 118/72  06/18/22 (!) 146/62    Wt Readings from Last 3 Encounters:  10/02/22 173 lb (78.5 kg)  06/18/22 172 lb (  78 kg)  06/06/22 173 lb (78.5 kg)    Physical Exam Vitals reviewed.  Constitutional:      Appearance: She is not ill-appearing.  HENT:     Nose: Nose normal.     Mouth/Throat:     Mouth: Mucous membranes are moist.  Eyes:     General: No scleral icterus.    Pupils: Pupils are equal, round, and reactive to light.  Cardiovascular:     Rate and Rhythm: Normal rate and regular rhythm.     Heart sounds: No murmur heard. Pulmonary:     Effort: Pulmonary effort is normal.     Breath sounds: No wheezing, rhonchi or rales.   Abdominal:     General: Abdomen is flat.     Palpations: There is no mass.     Tenderness: There is no abdominal tenderness. There is no guarding.     Hernia: No hernia is present.  Musculoskeletal:        General: Normal range of motion.     Cervical back: Neck supple.     Right lower leg: No edema.     Left lower leg: No edema.  Lymphadenopathy:     Cervical: No cervical adenopathy.  Skin:    General: Skin is warm and dry.     Coloration: Skin is not pale.     Findings: No rash.  Neurological:     Mental Status: She is alert.     Cranial Nerves: Dysarthria present.     Coordination: Romberg sign positive. Coordination abnormal.     Gait: Gait abnormal.     Deep Tendon Reflexes: Reflexes normal.     Lab Results  Component Value Date   WBC 6.9 05/30/2022   HGB 13.9 05/30/2022   HCT 42.3 05/30/2022   PLT 261 05/30/2022   GLUCOSE 104 (H) 05/30/2022   CHOL 260 (H) 06/06/2022   TRIG 249.0 (H) 06/06/2022   HDL 39.30 06/06/2022   LDLDIRECT 175.0 06/06/2022   LDLCALC 101 (H) 03/06/2021   ALT 16 05/30/2022   AST 19 05/30/2022   NA 143 05/30/2022   K 4.9 05/30/2022   CL 105 10/02/2022   CREATININE 1.2 (A) 10/02/2022   BUN 18 10/02/2022   CO2 22 10/02/2022   TSH 0.96 10/02/2022   INR 1.1 12/08/2021   HGBA1C 6.2 10/02/2022    MM 3D SCREEN BREAST BILATERAL  Result Date: 09/07/2022 CLINICAL DATA:  Screening. EXAM: DIGITAL SCREENING BILATERAL MAMMOGRAM WITH TOMOSYNTHESIS AND CAD TECHNIQUE: Bilateral screening digital craniocaudal and mediolateral oblique mammograms were obtained. Bilateral screening digital breast tomosynthesis was performed. The images were evaluated with computer-aided detection. COMPARISON:  Previous exam(s). ACR Breast Density Category b: There are scattered areas of fibroglandular density. FINDINGS: There are no findings suspicious for malignancy. IMPRESSION: No mammographic evidence of malignancy. A result letter of this screening mammogram will be  mailed directly to the patient. RECOMMENDATION: Screening mammogram in one year. (Code:SM-B-01Y) BI-RADS CATEGORY  1: Negative. Electronically Signed   By: Sherian Rein M.D.   On: 09/07/2022 13:34    Assessment & Plan:   Collene was seen today for hypertension and headache.  Diagnoses and all orders for this visit:  Worst headache of life- I recommended a stat MRI to evaluate for bleed and mass. 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  I recommended a stat MRI to see if she has had a new CVA. -  MR Brain Wo Contrast; Future  Essential hypertension- Her blood pressure is adequately well controlled.  I do not think this is causing her headache.  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   I am having [name] C. Kling maintain her aspirin EC, albuterol, multivitamin, montelukast, rosuvastatin, traMADol, allopurinol, modafinil, omeprazole, levothyroxine, budesonide-formoterol, and Clobetasol Propionate.  No orders of the defined types were placed in this encounter.    Follow-up: No follow-ups on file.  Sanda Linger, MD

## 2022-11-14 ENCOUNTER — Other Ambulatory Visit: Payer: Self-pay | Admitting: Internal Medicine

## 2022-11-14 DIAGNOSIS — R400 Somnolence: Secondary | ICD-10-CM

## 2022-11-14 MED ORDER — MODAFINIL 100 MG PO TABS: 100.0000 mg | ORAL_TABLET | Freq: Every day | ORAL | 0 refills | Status: DC

## 2022-11-19 ENCOUNTER — Ambulatory Visit: Payer: Self-pay | Admitting: Licensed Clinical Social Worker

## 2022-11-19 NOTE — Patient Instructions (Signed)
Visit Information  Thank you for taking time to visit with me today. Please don't hesitate to contact me if I can be of assistance to you.   Following are the goals we discussed today:   Goals Addressed             This Visit's Progress    Care Coordination Activities       Care Coordination Interventions: Solution-Focused Strategies employed:  Active listening / Reflection utilized  Emotional Support Provided Problem Solving /Task Center strategies reviewed Participation in counseling encouraged : initial appointment scheduled 11/20/22   Housing  (working to find new housing and looking into retirement communities)  Financial hardship resource options (provided information on [place] [employer] for senior employment [phone]) Mailed list for Assisted living and senior housing          Our next appointment is by telephone on 12/12/22 at 2:00  Please call the care guide team at [phone] if you need to cancel or reschedule your appointment.   Patient verbalizes understanding of instructions and care plan provided today and agrees to view in MyChart. Active MyChart status and patient understanding of how to access instructions and care plan via MyChart confirmed with patient.     Sammuel Hines, LCSW Social Work Care Coordination  [facility] [employer] [phone]

## 2022-11-19 NOTE — Patient Outreach (Signed)
  Care Coordination   Follow Up Visit Note   11/19/2022 Name: Alexis Shelton MRN: [id number] DOB: 09-05-1941  Alexis Shelton is a 81 y.o. year old female who sees Etta Grandchild, MD for primary care. I spoke with  Alexis Shelton by phone today.  What matters to the patients health and wellness today?    Patient continues to experience difficulty with managing her stressors..  Reports she has located a job and anticipates starting soon, however nervious that something will happen She continues to live in current apartment which she cannot afford and will have move in March Unable to locate affordable housing at this time  Recommendation: Patient may benefit from, and is in agreement to To keep appointment with therapist 11/20/22 Review assisted living and senior housing list mailed by LCSW    Goals Addressed             This Visit's Progress    Care Coordination Activities       Care Coordination Interventions: Solution-Focused Strategies employed:  Active listening / Reflection utilized  Emotional Support Provided Problem Solving /Task Center strategies reviewed Participation in counseling encouraged : initial appointment scheduled 11/20/22   Housing  (working to find new housing and looking into retirement communities)  Surveyor, quantity hardship resource options (provided information on [place] [employer] for senior employment [phone]) Mailed list for Assisted living and senior housing          SDOH assessments and interventions completed:  Yes  SDOH Interventions Today    Flowsheet Row Most Recent Value  SDOH Interventions   Housing Interventions Other (Comment)        Care Coordination Interventions:  Yes, provided   Follow up plan: Follow up call scheduled for 12/12/22    Encounter Outcome:  Pt. Visit Completed   Sammuel Hines, LCSW Social Work Care Coordination  [facility] [employer] [phone]

## 2022-11-20 ENCOUNTER — Ambulatory Visit (INDEPENDENT_AMBULATORY_CARE_PROVIDER_SITE_OTHER): Payer: Medicare HMO | Admitting: Psychology

## 2022-11-20 DIAGNOSIS — F3289 Other specified depressive episodes: Secondary | ICD-10-CM | POA: Diagnosis not present
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
## 2022-11-21 ENCOUNTER — Encounter: Payer: Self-pay | Admitting: Internal Medicine

## 2022-11-22 NOTE — Progress Notes (Signed)
Office Visit Note  Patient: Alexis Shelton             Date of Birth: 1941/08/21           MRN: [id number]             PCP: Etta Grandchild, MD Referring: Etta Grandchild, MD Visit Date: 12/04/2022 Occupation: @GUAROCC @  Subjective:  Pain in multiple joints  History of Present Illness: Olga Bapst is a 81 y.o. female with history of gout, osteoarthritis and degenerative disc disease.  She states that she has been taking allopurinol 100 mg p.o. daily without any interruption.  She states that she has been experiencing pain in multiple joints and feels like that her gout is active.  She states her gait is unstable and she ambulates with the help of a cane.  She has tried physical therapy in the past which is helpful but it is not covered by her insurance.  She also has vertigo.  She complains of discomfort in her bilateral hands, replaced knees, neck and lower back.  Activities of Daily Living:  Patient reports morning stiffness for 1 hour.   Patient Reports nocturnal pain.  Difficulty dressing/grooming: Denies Difficulty climbing stairs: Denies Difficulty getting out of chair: Denies Difficulty using hands for taps, buttons, cutlery, and/or writing: Reports  Review of Systems  Constitutional:  Positive for fatigue.  HENT:  Negative for mouth sores and mouth dryness.   Eyes:  Negative for dryness.  Respiratory:  Negative for shortness of breath.   Cardiovascular:  Negative for chest pain and palpitations.  Gastrointestinal:  Positive for constipation. Negative for blood in stool and diarrhea.  Endocrine: Negative for increased urination.  Genitourinary:  Negative for involuntary urination.  Musculoskeletal:  Positive for joint pain, gait problem, joint pain, myalgias, morning stiffness, muscle tenderness and myalgias. Negative for joint swelling and muscle weakness.  Skin:  Negative for color change, rash, hair loss and sensitivity to sunlight.  Allergic/Immunologic:  Negative for susceptible to infections.  Neurological:  Positive for headaches. Negative for dizziness.  Hematological:  Negative for swollen glands.  Psychiatric/Behavioral:  Positive for depressed mood and sleep disturbance. The patient is nervous/anxious.     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   Obesity 08/19/2020   Proteinuria 08/19/2020   Vitamin D deficiency 02/11/2019   Stage 3 chronic kidney disease (HCC) 02/11/2019   Mild intermittent asthma without complication 02/11/2019   Primary osteoarthritis of both hands 11/24/2018   Primary osteoarthritis of both feet 11/24/2018   DDD (degenerative disc disease), cervical 11/24/2018   DDD (degenerative disc disease), lumbar 11/24/2018   Gait abnormality 10/13/2018   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   Asthma 08/12/2018   Hypothyroidism 08/12/2018   Essential hypertension 07/04/2018   Seasonal allergic rhinitis 05/28/2017   Gouty arthropathy 09/15/2010   Osteoarthrosis, generalized, involving multiple sites 09/15/2010    Past Medical History:  Diagnosis Date   Arthritis    DDD (degenerative disc disease), lumbar 11/24/2018   Dizziness    Dyspnea    Elevated liver function tests    Gallstones    Hearing loss    History of  stroke involving cerebellum 10/13/2018   Hypercholesteremia    Hypertension    Hypothyroidism    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   Migraines    Mild intermittent asthma without complication 02/11/2019   Primary osteoarthritis of both feet 11/24/2018   Rheumatoid arthritis (HCC) 07/04/2018   Stage 3 chronic kidney  disease (HCC) 02/11/2019   Vitamin D deficiency 02/11/2019    Family History  Problem Relation Age of Onset   Stroke Mother    Hyperlipidemia Mother    Arthritis Mother    Heart disease Father    Arthritis Sister    Bipolar disorder Son    Healthy Son    Gallbladder disease Maternal Grandmother        radiation   GER disease Maternal Grandmother    Heart disease Paternal Uncle        x 7   Colon cancer Neg Hx    Esophageal cancer Neg Hx    Rectal cancer Neg Hx    Stomach cancer Neg Hx    Cancer Neg Hx    Past Surgical History:  Procedure Laterality Date   ABDOMINAL HYSTERECTOMY     BREAST REDUCTION SURGERY Bilateral    CATARACT EXTRACTION, BILATERAL     CHOLECYSTECTOMY N/A 12/11/2021   Procedure: LAPAROSCOPIC CHOLECYSTECTOMY;  Surgeon: Almond Lint, MD;  [facility];  Service: General;  Laterality: N/A;   ENDOSCOPIC RETROGRADE CHOLANGIOPANCREATOGRAPHY (ERCP) WITH PROPOFOL N/A 12/09/2021   Procedure: ENDOSCOPIC RETROGRADE CHOLANGIOPANCREATOGRAPHY (ERCP) WITH PROPOFOL;  Surgeon: Rachael Fee, MD;  [facility];  Service: Endoscopy;  Laterality: N/A;   INCISION AND DRAINAGE BREAST ABSCESS Left    REDUCTION MAMMAPLASTY     REMOVAL OF STONES  12/09/2021   Procedure: REMOVAL OF STONES;  Surgeon: Rachael Fee, MD;  [facility];  Service: Endoscopy;;   REPLACEMENT TOTAL KNEE Bilateral    SPHINCTEROTOMY  12/09/2021   Procedure: SPHINCTEROTOMY;  Surgeon: Rachael Fee, MD;  [facility];  Service: Endoscopy;;   TONSILLECTOMY AND ADENOIDECTOMY     TOOTH EXTRACTION  2023   x2   Social History   Social History Narrative   Lives alone.  The patient is widowed.   She worked at the [employer] in [place], MD scientist, and then moved to [place] and moved to [place] where her son is a International aid/development worker, after her husband died   Right-handed.   1 cup coffee per day.   No alcohol   1 son as above   Former smoker   Immunization History   Administered Date(s) Administered   Fluad Quad(high Dose 65+) 10/09/2021, 10/02/2022   Influenza, High Dose Seasonal PF 09/20/2018, 09/11/2019, 12/08/2019   Influenza-Unspecified 11/20/2020   Moderna Sars-Covid-2 Vaccination 01/08/2020, 02/04/2020   PFIZER(Purple Top)SARS-COV-2 Vaccination 11/20/2020   PNEUMOCOCCAL CONJUGATE-20 02/21/2022   Td 10/09/2021     Objective: Vital Signs: BP (!) 161/75 (BP Location: Left Arm, Patient Position: Sitting, Cuff Size: Normal)   Pulse 65   Resp 15   Ht 5' 6.5" (1.689 m)   Wt 175 lb 6.4 oz (79.6 kg)   BMI 27.89 kg/m    Physical Exam Vitals and nursing note reviewed.  Constitutional:      Appearance: She is well-developed.  HENT:     Head: Normocephalic and atraumatic.  Eyes:     Conjunctiva/sclera: Conjunctivae normal.  Cardiovascular:     Rate and Rhythm: Normal rate and regular rhythm.     Heart sounds: Normal heart sounds.  Pulmonary:  Effort: Pulmonary effort is normal.     Breath sounds: Normal breath sounds.  Abdominal:     General: Bowel sounds are normal.     Palpations: Abdomen is soft.  Musculoskeletal:     Cervical back: Normal range of motion.  Lymphadenopathy:     Cervical: No cervical adenopathy.  Skin:    General: Skin is warm and dry.     Capillary Refill: Capillary refill takes less than 2 seconds.  Neurological:     Mental Status: She is alert and oriented to person, place, and time.  Psychiatric:        Behavior: Behavior normal.     Musculoskeletal Exam: She had limited range of motion of the cervical and lumbar spine with discomfort.  Shoulder joints and elbow joints in good range of motion.  She had bilateral CMC PIP and DIP thickening with incomplete extension of the DIP joints.  Hip joints are in good range of motion.  Knee joints in good range of motion.  There was no tenderness over ankles or MTPs.  CDAI Exam: CDAI Score: -- Patient Global: --; Provider Global: -- Swollen: --; Tender: -- Joint  Exam 12/04/2022   No joint exam has been documented for this visit   There is currently no information documented on the homunculus. Go to the Rheumatology activity and complete the homunculus joint exam.  Investigation: No additional findings.  Imaging: MR Brain Wo Contrast  Result Date: 11/25/2022 CLINICAL DATA:  Headache, new onset.  Ataxia and dizziness. EXAM: MRI HEAD WITHOUT CONTRAST TECHNIQUE: Multiplanar, multiecho pulse sequences of the brain and surrounding structures were obtained without intravenous contrast. COMPARISON:  MR head without and with contrast 08/02/2018 FINDINGS: Brain: No acute infarct, hemorrhage, or mass lesion is present. Periventricular T2 hyperintensities are mildly advanced for age, stable from prior exam. A remote lacunar infarct is present within the right caudate head. Deep brain nuclei are otherwise within normal limits. The ventricles are of normal size. No significant extraaxial fluid collection is present. Remote cerebellar infarcts are stable, right greater than left. Brainstem is within normal limits. Vascular: Insert normal flow Skull and upper cervical spine: The craniocervical junction is normal. Upper cervical spine is within normal limits. Marrow signal is unremarkable. Sinuses/Orbits: The paranasal sinuses and mastoid air cells are clear. Bilateral lens replacements are noted. Globes and orbits are otherwise unremarkable. IMPRESSION: 1. No acute intracranial abnormality or significant interval change. 2. Periventricular T2 hyperintensities are mildly advanced for age. The finding is nonspecific but can be seen in the setting of chronic microvascular ischemia, a demyelinating process such as multiple sclerosis, vasculitis, complicated migraine headaches, or as the sequelae of a prior infectious or inflammatory process. 3. Remote cerebellar infarcts are stable, right greater than left. Electronically Signed   By: Marin Roberts M.D.   On: 11/25/2022  17:40   CARDIAC EVENT MONITOR  Result Date: 11/21/2022   Predominant rhythm is normal sinus rhythm with average heart rate 69 bpm and ranged from 51 to 110 bpm.   Nonsustained atrial tachycardia for 7.5 seconds at 175 bpm   Occasional PVC   Occasional PAC   Wide-complex tachycardia for 4 beats    Recent Labs: Lab Results  Component Value Date   WBC 6.9 05/30/2022   HGB 13.9 05/30/2022   PLT 261 05/30/2022   NA 143 05/30/2022   K 4.9 05/30/2022   CL 105 10/02/2022   CO2 22 10/02/2022   GLUCOSE 104 (H) 05/30/2022   BUN 18 10/02/2022  CREATININE 1.2 (A) 10/02/2022   BILITOT 0.3 05/30/2022   ALKPHOS 72 02/21/2022   AST 19 05/30/2022   ALT 16 05/30/2022   PROT 6.8 05/30/2022   ALBUMIN 4.3 10/02/2022   CALCIUM 9.5 10/02/2022   GFRAA 41 (L) 12/08/2019    Speciality Comments: No specialty comments available.  Procedures:  No procedures performed Allergies: Patient has no known allergies.   Assessment / Plan:     Visit Diagnoses: Idiopathic chronic gout of multiple sites without tophus -patient denies having any gout flare although she has been experiencing pain and discomfort in multiple joints and is concerned that she is having minor gout flares.  Uric acid was 5.0 on October 02, 2022.  No joint swelling was noted on the examination today.  She is on allopurinol 100 mg 1 tablet by mouth daily.  I will check uric acid levels today.- Plan: Uric acid  Medication monitoring encounter - uric acid: 5.0 on 10/02/2022.  Labs obtained on May 30, 2022 CBC and CMP were stable.  Creatinine stays elevated.- Plan: CBC with Differential/Platelet, COMPLETE METABOLIC PANEL WITH GFR  Chronic renal impairment, stage 3b (HCC) -she has elevated creatinine which is stable.  She is followed by nephrology.  Primary osteoarthritis of both hands-she has severe osteoarthritis in her hands with bilateral CMC PIP and DIP thickening.  No swelling was noted.  Joint protection was discussed.  Status post  total bilateral knee replacement-she had good range of motion without discomfort.  No warmth swelling or effusion was noted.  A handout on lower extremity muscle strengthening exercises was given.  Primary osteoarthritis of both feet-proper fitting shoes were advised.  DDD (degenerative disc disease), cervical-she had limited range of motion.  DDD (degenerative disc disease), lumbar-she continues to have lower back pain.  She ambulates with the help of a cane.  Poor balance-I offered physical therapy or further therapy which she declined.  Patient states that her insurance does not cover physical therapy and water therapy.  I advised her to look into the Silver sneakers program and see if she can contact [facility].  Other medical problems are listed as follows:  Dyslipidemia  Essential hypertension-blood pressure was elevated today.  Repeat blood pressure was also elevated.  She was advised to monitor blood pressure closely and follow-up with the PCP.  History of stroke involving cerebellum  Vitamin D deficiency  History of hypothyroidism  Orders: Orders Placed This Encounter  Procedures   CBC with Differential/Platelet   COMPLETE METABOLIC PANEL WITH GFR   Uric acid   No orders of the defined types were placed in this encounter.    Follow-Up Instructions: Return in about 6 months (around 06/05/2023) for Osteoarthritis, Gout.   Pollyann Savoy, MD  Note - This record has been created using Animal nutritionist.  Chart creation errors have been sought, but may not always  have been located. Such creation errors do not reflect on  the standard of medical care.

## 2022-11-25 ENCOUNTER — Ambulatory Visit
Admission: RE | Admit: 2022-11-25 | Discharge: 2022-11-25 | Disposition: A | Discharge status: Home / Self Care | Payer: Medicare HMO | Source: Ambulatory Visit | Attending: Internal Medicine | Admitting: Internal Medicine

## 2022-11-25 DIAGNOSIS — R270 Ataxia, unspecified: Secondary | ICD-10-CM | POA: Diagnosis not present

## 2022-11-25 DIAGNOSIS — R519 Headache, unspecified: Secondary | ICD-10-CM | POA: Diagnosis not present

## 2022-11-25 DIAGNOSIS — R42 Dizziness and giddiness: Secondary | ICD-10-CM | POA: Diagnosis not present

## 2022-12-04 ENCOUNTER — Encounter: Payer: Self-pay | Admitting: Rheumatology

## 2022-12-04 ENCOUNTER — Ambulatory Visit (INDEPENDENT_AMBULATORY_CARE_PROVIDER_SITE_OTHER): Payer: Medicare HMO | Admitting: Psychology

## 2022-12-04 ENCOUNTER — Ambulatory Visit: Payer: Medicare HMO | Attending: Rheumatology | Admitting: Rheumatology

## 2022-12-04 VITALS — BP 161/75 | HR 65 | Resp 15 | Ht 66.5 in | Wt 175.4 lb

## 2022-12-04 DIAGNOSIS — M19042 Primary osteoarthritis, left hand: Secondary | ICD-10-CM

## 2022-12-04 DIAGNOSIS — Z8639 Personal history of other endocrine, nutritional and metabolic disease: Secondary | ICD-10-CM

## 2022-12-04 DIAGNOSIS — M5030 Other cervical disc degeneration, unspecified cervical region: Secondary | ICD-10-CM

## 2022-12-04 DIAGNOSIS — I10 Essential (primary) hypertension: Secondary | ICD-10-CM

## 2022-12-04 DIAGNOSIS — E785 Hyperlipidemia, unspecified: Secondary | ICD-10-CM | POA: Diagnosis not present

## 2022-12-04 DIAGNOSIS — M19041 Primary osteoarthritis, right hand: Secondary | ICD-10-CM

## 2022-12-04 DIAGNOSIS — M5136 Other intervertebral disc degeneration, lumbar region: Secondary | ICD-10-CM | POA: Diagnosis not present

## 2022-12-04 DIAGNOSIS — M1A09X0 Idiopathic chronic gout, multiple sites, without tophus (tophi): Secondary | ICD-10-CM

## 2022-12-04 DIAGNOSIS — Z96653 Presence of artificial knee joint, bilateral: Secondary | ICD-10-CM

## 2022-12-04 DIAGNOSIS — M19072 Primary osteoarthritis, left ankle and foot: Secondary | ICD-10-CM

## 2022-12-04 DIAGNOSIS — Z5181 Encounter for therapeutic drug level monitoring: Secondary | ICD-10-CM

## 2022-12-04 DIAGNOSIS — M19071 Primary osteoarthritis, right ankle and foot: Secondary | ICD-10-CM | POA: Diagnosis not present

## 2022-12-04 DIAGNOSIS — Z8673 Personal history of transient ischemic attack (TIA), and cerebral infarction without residual deficits: Secondary | ICD-10-CM

## 2022-12-04 DIAGNOSIS — R2689 Other abnormalities of gait and mobility: Secondary | ICD-10-CM

## 2022-12-04 DIAGNOSIS — F3289 Other specified depressive episodes: Secondary | ICD-10-CM

## 2022-12-04 DIAGNOSIS — N1832 Chronic kidney disease, stage 3b: Secondary | ICD-10-CM | POA: Diagnosis not present

## 2022-12-04 DIAGNOSIS — E559 Vitamin D deficiency, unspecified: Secondary | ICD-10-CM

## 2022-12-04 NOTE — Progress Notes (Signed)
[facility] Counselor/Therapist Progress Note  Patient ID: Jamaica Inthavong, MRN: [id number]    Date: 12/04/22  Time Spent: 10:04  am - 10:58 am : 54 Minutes  Treatment Type: Individual Therapy.  Reported Symptoms: anxiety and depression.   Mental Status Exam: Appearance:  Neat and Well Groomed     Behavior: Appropriate  Motor: Normal  Speech/Language:  Clear and Coherent  Affect: Congruent  Mood: anxious and dysthymic  Thought process: normal  Thought content:   WNL  Sensory/Perceptual disturbances:   WNL  Orientation: oriented to person, place, time/date, and situation  Attention: Good  Concentration: Good  Memory: WNL  Fund of knowledge:  Good  Insight:   Good  Judgment:  Good  Impulse Control: Good   Risk Assessment: Danger to Self:  No Self-injurious Behavior: No Danger to Others: No Duty to Warn:no Physical Aggression / Violence:No  Access to Firearms a concern: No  Gang Involvement:No   Subjective:   [name] participated in the session, in person in the office with the therapist, and consented to treatment Augustina reviewed the events of the past week. We reviewed numerous treatment approaches including CBT, BA, Problem Solving, and Solution focused therapy. Psych-education regarding the Jan's diagnosis of Other depression was provided during the session. We discussed Khalil Belote Hudock's goals treatment goals which include processing relationships with son and sister, managing her overall symptoms, manage overall stressors, including financials, and process past events. [name] provided verbal approval of the treatment plan.   GAD-7:21 PHQ-9: 14  Interventions: Psycho-education & Goal Setting.   Diagnosis:   Other depression  Psychiatric Treatment: No   Treatment Plan:  Client Abilities/Strengths Vernie is verbose and motivated for change.   Support System: Friend: [name]  Client Treatment  Preferences Outpatient therapy.  Client Statement of Needs Taylor would like to process relationships with son and sister, managing her overall symptoms, manage overall stressors, including financials, and process past events.  Additionally, processing aging, feeling lonely, and financial stressors.   Treatment Level Weekly  Symptoms  Depression: loss of interest, feeling down, difficulty sleeping (middle insomnia) ,lethargy, fluctuating appetite (poor appetite), , trouble concentration, psychomotor retardation, and denied history of SI   (Status: maintained) Anxiety: Feeling nervous, difficulty managing worry, worrying about different things, trouble relaxing, difficulty sitting still, easily irritable, feeling afraid something awful might happen.    (Status: maintained)  Goals:   Arbutus experiences symptoms of depression and anxiety.    Target Date: 12/05/23 Frequency: Weekly  Progress: 0 Modality: individual    Therapist will provide referrals for additional resources as appropriate.  Therapist will provide psycho-education regarding Malaijah's diagnosis and corresponding treatment approaches and interventions. Licensed Clinical Social Worker, [employer], LCSW will support the patient's ability to achieve the goals identified. will employ CBT, BA, Problem-solving, Solution Focused, Mindfulness,  coping skills, & other evidenced-based practices will be used to promote progress towards healthy functioning to help manage decrease symptoms associated with her diagnosis.   Reduce overall level, frequency, and intensity of the feelings of depression and anxiety evidenced by decreased overall symptoms from 6 to 7 days/week to 0 to 1 days/week per client report for at least 3 consecutive months. Verbally express understanding of the relationship between feelings of depression, anxiety and their impact on thinking patterns and behaviors. Verbalize an understanding of the role that distorted thinking  plays in creating fears, excessive worry, and ruminations.  [name] participated in the creation of the treatment plan)  Delight Ovens, LCSW

## 2022-12-04 NOTE — Patient Instructions (Signed)

## 2022-12-05 ENCOUNTER — Other Ambulatory Visit: Payer: Self-pay | Admitting: *Deleted

## 2022-12-05 DIAGNOSIS — M1A09X0 Idiopathic chronic gout, multiple sites, without tophus (tophi): Secondary | ICD-10-CM

## 2022-12-05 LAB — CBC WITH DIFFERENTIAL/PLATELET
Absolute Monocytes: 660 cells/uL (ref 200–950)
Basophils Absolute: 43 cells/uL (ref 0–200)
Basophils Relative: 0.6 %
Eosinophils Absolute: 291 cells/uL (ref 15–500)
Eosinophils Relative: 4.1 %
HCT: 42 % (ref 35.0–45.0)
Hemoglobin: 13.9 g/dL (ref 11.7–15.5)
Lymphs Abs: 1882 cells/uL (ref 850–3900)
MCH: 30.2 pg (ref 27.0–33.0)
MCHC: 33.1 g/dL (ref 32.0–36.0)
MCV: 91.3 fL (ref 80.0–100.0)
MPV: 9.9 fL (ref 7.5–12.5)
Monocytes Relative: 9.3 %
Neutro Abs: 4225 cells/uL (ref 1500–7800)
Neutrophils Relative %: 59.5 %
Platelets: 244 10*3/uL (ref 140–400)
RBC: 4.6 10*6/uL (ref 3.80–5.10)
RDW: 15.2 % — ABNORMAL HIGH (ref 11.0–15.0)
Total Lymphocyte: 26.5 %
WBC: 7.1 10*3/uL (ref 3.8–10.8)

## 2022-12-05 LAB — COMPLETE METABOLIC PANEL WITH GFR
AG Ratio: 1.4 (calc) (ref 1.0–2.5)
ALT: 22 U/L (ref 6–29)
AST: 20 U/L (ref 10–35)
Albumin: 4 g/dL (ref 3.6–5.1)
Alkaline phosphatase (APISO): 62 U/L (ref 37–153)
BUN/Creatinine Ratio: 18 (calc) (ref 6–22)
BUN: 27 mg/dL — ABNORMAL HIGH (ref 7–25)
CO2: 28 mmol/L (ref 20–32)
Calcium: 9.7 mg/dL (ref 8.6–10.4)
Chloride: 107 mmol/L (ref 98–110)
Creat: 1.47 mg/dL — ABNORMAL HIGH (ref 0.60–0.95)
Globulin: 2.8 g/dL (calc) (ref 1.9–3.7)
Glucose, Bld: 110 mg/dL — ABNORMAL HIGH (ref 65–99)
Potassium: 5.1 mmol/L (ref 3.5–5.3)
Sodium: 144 mmol/L (ref 135–146)
Total Bilirubin: 0.3 mg/dL (ref 0.2–1.2)
Total Protein: 6.8 g/dL (ref 6.1–8.1)
eGFR: 35 mL/min/{1.73_m2} — ABNORMAL LOW (ref >=60)

## 2022-12-05 LAB — URIC ACID: Uric Acid, Serum: 7.8 mg/dL — ABNORMAL HIGH (ref 2.5–7.0)

## 2022-12-05 NOTE — Progress Notes (Signed)
CBC is normal, CMP shows mildly elevated glucose probably not a fasting sample.  Creatinine is elevated at 1.47 with low GFR.  Uric acid is elevated at 7.8.  The goal is to keep uric acid between 4 and 5.  Please advise patient to increase allopurinol to 100 mg tablet, 2 tablets daily.  Patient may need a new prescription.  Please forward lab results to her PCP and her nephrologist.

## 2022-12-06 MED ORDER — ALLOPURINOL 100 MG PO TABS: 100.0000 mg | ORAL_TABLET | Freq: Two times a day (BID) | ORAL | 0 refills | Status: DC

## 2022-12-11 ENCOUNTER — Ambulatory Visit: Payer: Self-pay | Admitting: Licensed Clinical Social Worker

## 2022-12-11 ENCOUNTER — Other Ambulatory Visit: Payer: Self-pay | Admitting: Internal Medicine

## 2022-12-11 NOTE — Patient Outreach (Signed)
  Care Coordination  Follow Up Visit Note   12/11/2022 Name: Sira Adsit MRN: [id number] DOB: 31-Dec-1940  Roslynn Holte is a 81 y.o. year old female who sees Etta Grandchild, MD for primary care. I spoke with  [name] by phone today.  What matters to the patients health and wellness today?    Patient is making progress with her goals,   Recommendation: Patient may benefit from, and is in agreement to :  Will     Goals Addressed             This Visit's Progress    COMPLETED: Care Coordination Activities       Care Coordination Interventions: Solution-Focused Strategies employed:  Active listening / Reflection utilized  Emotional Support Provided Problem Solving /Task Center strategies reviewed Participation in counseling encouraged : will continue with therapy   Housing  (discuss housing options, and apartments explored)  Offered Care Coordination with RN: Declined Provided information for [facility] [phone]           SDOH assessments and interventions completed:  Yes  SDOH Interventions Today    Flowsheet Row Most Recent Value  SDOH Interventions   Housing Interventions Intervention Not Indicated       Care Coordination Interventions:  Yes, provided   Follow up plan: No further intervention required.   Encounter Outcome:  Pt. Visit Completed   Sammuel Hines, LCSW Social Work Care Coordination  [facility] [employer] [phone]

## 2022-12-11 NOTE — Patient Instructions (Signed)
Visit Information  Thank you for taking time to visit with me today. Please don't hesitate to contact me if I can be of assistance to you.   Following are the goals we discussed today:   Goals Addressed             This Visit's Progress    COMPLETED: Care Coordination Activities       Care Coordination Interventions: Solution-Focused Strategies employed:  Active listening / Reflection utilized  Emotional Support Provided Problem Solving /Task Center strategies reviewed Participation in counseling encouraged : will continue with therapy   Housing  (discuss housing options, and apartments explored)  Offered Care Coordination with RN: Declined Provided information for [facility] [phone]           Please call the care guide team at [phone] if you need to cancel or reschedule your appointment.    Patient verbalizes understanding of instructions and care plan provided today and agrees to view in MyChart. Active MyChart status and patient understanding of how to access instructions and care plan via MyChart confirmed with patient.     No further follow up required: by Care Coordination at this time  Sammuel Hines, [employer] Social Work Care Coordination  [facility] [employer] [phone]

## 2022-12-12 ENCOUNTER — Encounter: Payer: Medicare HMO | Admitting: Licensed Clinical Social Worker

## 2022-12-12 ENCOUNTER — Ambulatory Visit (INDEPENDENT_AMBULATORY_CARE_PROVIDER_SITE_OTHER): Payer: Medicare HMO

## 2022-12-12 ENCOUNTER — Encounter: Payer: Self-pay | Admitting: Family Medicine

## 2022-12-12 ENCOUNTER — Ambulatory Visit (INDEPENDENT_AMBULATORY_CARE_PROVIDER_SITE_OTHER): Payer: Medicare HMO | Admitting: Family Medicine

## 2022-12-12 VITALS — BP 148/74 | HR 73 | Temp 97.6°F | Ht 66.5 in | Wt 175.0 lb

## 2022-12-12 DIAGNOSIS — W19XXXA Unspecified fall, initial encounter: Secondary | ICD-10-CM

## 2022-12-12 DIAGNOSIS — M545 Low back pain, unspecified: Secondary | ICD-10-CM

## 2022-12-12 NOTE — Progress Notes (Signed)
Subjective:     Patient ID: Alexis Shelton, female    DOB: 09-27-1941, 81 y.o.   MRN: [id number]  Chief Complaint  Patient presents with   Back Pain    Tripped in her dark bedroom and fell on some furniture on her back and butt. Back has been bothering her and continuously getting worse, worse when walking. This morning woke up and pain moved to her left hip    Back Pain   Patient is in today for low left back pain since tripping in her bedroom in the dark 3 nights ago. She landed on her buttocks. Denies LOC. No neck pain.  No focal weakness.  She uses a cane for assistance. States she has poor balance in general.   States she did not have back pain right away. She was able to her herself up and to bed without much difficulty.   States she took 2 Tylenol PM and her usual Tramadol last night. States she did not have pain when she woke up this morning.   Pain has return to her left low back with activity today but she has been able to function as needed today.   Denies fever, chills,  chest pain, palpitations, shortness of breath, abdominal pain, N/V/D, urinary symptoms, LE edema.    Health Maintenance Due  Topic Date Due   DEXA SCAN  Never done    Past Medical History:  Diagnosis Date   Arthritis    DDD (degenerative disc disease), lumbar 11/24/2018   Dizziness    Dyspnea    Elevated liver function tests    Gallstones    Hearing loss    History of stroke involving cerebellum 10/13/2018   Hypercholesteremia    Hypertension    Hypothyroidism    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   Migraines    Mild intermittent asthma without complication 02/11/2019   Primary osteoarthritis of both feet 11/24/2018   Rheumatoid arthritis (HCC) 07/04/2018   Stage 3 chronic kidney disease (HCC) 02/11/2019   Vitamin D deficiency 02/11/2019    Past Surgical History:  Procedure Laterality Date   ABDOMINAL HYSTERECTOMY     BREAST REDUCTION SURGERY Bilateral    CATARACT  EXTRACTION, BILATERAL     CHOLECYSTECTOMY N/A 12/11/2021   Procedure: LAPAROSCOPIC CHOLECYSTECTOMY;  Surgeon: Almond Lint, MD;  Location: [facility];  Service: General;  Laterality: N/A;   ENDOSCOPIC RETROGRADE CHOLANGIOPANCREATOGRAPHY (ERCP) WITH PROPOFOL N/A 12/09/2021   Procedure: ENDOSCOPIC RETROGRADE CHOLANGIOPANCREATOGRAPHY (ERCP) WITH PROPOFOL;  Surgeon: Rachael Fee, MD;  Location: [facility];  Service: Endoscopy;  Laterality: N/A;   INCISION AND DRAINAGE BREAST ABSCESS Left    REDUCTION MAMMAPLASTY     REMOVAL OF STONES  12/09/2021   Procedure: REMOVAL OF STONES;  Surgeon: Rachael Fee, MD;  Location: [facility];  Service: Endoscopy;;   REPLACEMENT TOTAL KNEE Bilateral    SPHINCTEROTOMY  12/09/2021   Procedure: SPHINCTEROTOMY;  Surgeon: Rachael Fee, MD;  Location: [facility];  Service: Endoscopy;;   TONSILLECTOMY AND ADENOIDECTOMY     TOOTH EXTRACTION  2023   x2    Family History  Problem Relation Age of Onset   Stroke Mother    Hyperlipidemia Mother    Arthritis Mother    Heart disease Father    Arthritis Sister    Bipolar disorder Son    Healthy Son    Gallbladder disease Maternal Grandmother        radiation   GER disease Maternal Grandmother  Heart disease Paternal Uncle        x 7   Colon cancer Neg Hx    Esophageal cancer Neg Hx    Rectal cancer Neg Hx    Stomach cancer Neg Hx    Cancer Neg Hx     Social History   Socioeconomic History   Marital status: Divorced    Spouse name: Not on file   Number of children: 1   Years of education: MD   Highest education level: Not on file  Occupational History   Occupation: Retired - Sales promotion account executive    Comment: [employer]  Tobacco Use   Smoking status: Former    Packs/day: 0.10    Years: 3.00    Total pack years: 0.30    Types: Cigarettes    Quit date: 1970    Years since quitting: 54.0    Passive exposure: Never   Smokeless tobacco: Never  Vaping Use   Vaping Use: Never used  Substance  and Sexual Activity   Alcohol use: Not Currently   Drug use: Never   Sexual activity: Not Currently  Other Topics Concern   Not on file  Social History Narrative   Lives alone.  The patient is widowed.   She worked at the [employer] in [place], MD scientist, and then moved to [place] and moved to [place] where her son is a International aid/development worker, after her husband died   Right-handed.   1 cup coffee per day.   No alcohol   1 son as above   Former smoker   Social Determinants of Corporate investment banker Strain: High Risk (06/18/2022)   Overall Financial Resource Strain (CARDIA)    Difficulty of Paying Living Expenses: Very hard  Food Insecurity: Food Insecurity Present (06/18/2022)   Hunger Vital Sign    Worried About Running Out of Food in the Last Year: Often true    Ran Out of Food in the Last Year: Never true  Transportation Needs: No Transportation Needs (06/18/2022)   PRAPARE - Administrator, Civil Service (Medical): No    Lack of Transportation (Non-Medical): No  Physical Activity: Insufficiently Active (06/18/2022)   Exercise Vital Sign    Days of Exercise per Week: 1 day    Minutes of Exercise per Session: 20 min  Stress: Stress Concern Present (06/18/2022)   [employer] of Occupational Health - Occupational Stress Questionnaire    Feeling of Stress : Very much  Social Connections: Unknown (06/18/2022)   Social Connection and Isolation Panel [NHANES]    Frequency of Communication with Friends and Family: More than three times a week    Frequency of Social Gatherings with Friends and Family: Twice a week    Attends Religious Services: 1 to 4 times per year    Active Member of [employer] or Organizations: No    Attends Banker Meetings: Never    Marital Status: Patient refused  Intimate Partner Violence: Not At Risk (06/18/2022)   Humiliation, Afraid, Rape, and Kick questionnaire    Fear of Current or Ex-Partner: No    Emotionally Abused: No     Physically Abused: No    Sexually Abused: No    Outpatient Medications Prior to Visit  Medication Sig Dispense Refill   albuterol (VENTOLIN HFA) 108 (90 Base) MCG/ACT inhaler Inhale 2 puffs into the lungs every 6 (six) hours as needed for wheezing or shortness of breath. 54 g 3   allopurinol (ZYLOPRIM) 100 MG tablet Take  1 tablet (100 mg total) by mouth 2 (two) times daily. 180 tablet 0   aspirin 81 MG EC tablet Take 81 mg by mouth daily. Swallow whole.     budesonide-formoterol (SYMBICORT) 160-4.5 MCG/ACT inhaler Inhale 2 puffs into the lungs daily as needed (sob/wheezing). 30.6 g 1   Clobetasol Propionate 0.05 % shampoo Apply 1 Act topically daily. 118 mL 2   levothyroxine (SYNTHROID) 88 MCG tablet TAKE 1 TABLET BY MOUTH EVERY DAY BEFORE BREAKFAST 30 tablet 1   modafinil (PROVIGIL) 100 MG tablet Take 1 tablet (100 mg total) by mouth daily. 90 tablet 0   montelukast (SINGULAIR) 10 MG tablet TAKE 1 TABLET BY MOUTH EVERYDAY AT BEDTIME 90 tablet 1   Multiple Vitamin (MULTIVITAMIN) tablet Take 1 tablet by mouth daily.     omeprazole (PRILOSEC) 40 MG capsule TAKE 1 CAPSULE (40 MG TOTAL) BY MOUTH DAILY 30 MINS BEFORE BREAKFAST 90 capsule 0   rosuvastatin (CRESTOR) 10 MG tablet TAKE 1 TABLET BY MOUTH EVERY DAY 90 tablet 1   traMADol (ULTRAM) 50 MG tablet Take 1 tablet (50 mg total) by mouth every 6 (six) hours as needed. 270 tablet 0   No facility-administered medications prior to visit.    No Known Allergies  Review of Systems  Musculoskeletal:  Positive for back pain.       Objective:    Physical Exam Constitutional:      General: She is not in acute distress.    Appearance: She is not ill-appearing.  HENT:     Head: Atraumatic.     Mouth/Throat:     Mouth: Mucous membranes are moist.  Eyes:     Conjunctiva/sclera: Conjunctivae normal.  Cardiovascular:     Rate and Rhythm: Normal rate and regular rhythm.  Pulmonary:     Effort: Pulmonary effort is normal.     Breath sounds:  Normal breath sounds.  Chest:     Chest wall: No tenderness.  Abdominal:     Tenderness: There is no right CVA tenderness or left CVA tenderness.  Musculoskeletal:     Cervical back: Normal, normal range of motion and neck supple.     Thoracic back: Normal.     Lumbar back: Tenderness present. No edema or deformity. Normal range of motion. Negative right straight leg raise test and negative left straight leg raise test.       Back:     Comments: Lumbar spine TTP without obvious step off and left lumbar paraspinal muscle TTP. No rib tenderness.  She has good ROM of her low back and hips bilaterally.   Skin:    General: Skin is warm.     Findings: No bruising.  Neurological:     General: No focal deficit present.     Mental Status: She is alert and oriented to person, place, and time.  Psychiatric:        Mood and Affect: Mood normal.        Behavior: Behavior normal.        Thought Content: Thought content normal.     BP (!) 148/74 (BP Location: Left Arm, Patient Position: Sitting, Cuff Size: Large)   Pulse 73   Temp 97.6 F (36.4 C) (Temporal)   Ht 5' 6.5" (1.689 m)   Wt 175 lb (79.4 kg)   SpO2 90%   BMI 27.82 kg/m  Wt Readings from Last 3 Encounters:  12/12/22 175 lb (79.4 kg)  12/04/22 175 lb 6.4 oz (79.6 kg)  10/02/22 173 lb (  78.5 kg)       Assessment & Plan:   Problem List Items Addressed This Visit   None Visit Diagnoses     Fall, initial encounter    -  Primary   Relevant Orders   DG Lumbar Spine Complete   Acute left-sided low back pain without sciatica       Relevant Orders   DG Lumbar Spine Complete      She is in today for a mechanical fall 3 nights ago in her home.  Denies LOC.  She is not on anticoagulants.  No red flags on exam.  Tenderness to lumbar spine.  Stat lumbar spine x-ray ordered. Discussed pain control with heating pad or an ice pack, topical analgesics over-the-counter, Tylenol and her usual tramadol.  Recommend against taking Tylenol  PM and tramadol together. Follow-up pending x-ray.  She will let us know if she is not gradually improving over the next few days.  I am having [name] C. Irion maintain her aspirin EC, albuterol, multivitamin, montelukast, rosuvastatin, traMADol, omeprazole, levothyroxine, budesonide-formoterol, Clobetasol Propionate, modafinil, and allopurinol.  No orders of the defined types were placed in this encounter.

## 2022-12-12 NOTE — Patient Instructions (Signed)
Please go downstairs for an x-ray of your low back before you leave today.  I recommend using an ice pack or heating pad and a topical pain medication such as Salonpas with lidocaine.  This comes in a roll on or patch.  You may take Tylenol 1000 mg 2 or 3 times per day.  Take your tramadol as usual.  I recommend against taking Tylenol PM and tramadol together due to these medications causing drowsiness.  We will be in touch with your x-ray result

## 2022-12-20 ENCOUNTER — Encounter (HOSPITAL_COMMUNITY): Payer: Self-pay

## 2022-12-20 ENCOUNTER — Emergency Department (HOSPITAL_COMMUNITY): Payer: Medicare HMO

## 2022-12-20 ENCOUNTER — Other Ambulatory Visit: Payer: Self-pay

## 2022-12-20 ENCOUNTER — Emergency Department (HOSPITAL_COMMUNITY)
Admission: EM | Admit: 2022-12-20 | Discharge: 2022-12-21 | Disposition: A | Discharge status: Home / Self Care | Payer: Medicare HMO | Attending: Emergency Medicine | Admitting: Emergency Medicine

## 2022-12-20 DIAGNOSIS — G43809 Other migraine, not intractable, without status migrainosus: Secondary | ICD-10-CM | POA: Diagnosis not present

## 2022-12-20 DIAGNOSIS — R519 Headache, unspecified: Secondary | ICD-10-CM | POA: Diagnosis not present

## 2022-12-20 DIAGNOSIS — E039 Hypothyroidism, unspecified: Secondary | ICD-10-CM | POA: Diagnosis not present

## 2022-12-20 DIAGNOSIS — I10 Essential (primary) hypertension: Secondary | ICD-10-CM | POA: Diagnosis not present

## 2022-12-20 DIAGNOSIS — R1111 Vomiting without nausea: Secondary | ICD-10-CM | POA: Diagnosis not present

## 2022-12-20 DIAGNOSIS — N183 Chronic kidney disease, stage 3 unspecified: Secondary | ICD-10-CM | POA: Diagnosis not present

## 2022-12-20 DIAGNOSIS — I159 Secondary hypertension, unspecified: Secondary | ICD-10-CM | POA: Diagnosis not present

## 2022-12-20 DIAGNOSIS — G4489 Other headache syndrome: Secondary | ICD-10-CM | POA: Diagnosis not present

## 2022-12-20 DIAGNOSIS — Z7982 Long term (current) use of aspirin: Secondary | ICD-10-CM | POA: Diagnosis not present

## 2022-12-20 DIAGNOSIS — Z79899 Other long term (current) drug therapy: Secondary | ICD-10-CM | POA: Diagnosis not present

## 2022-12-20 DIAGNOSIS — I129 Hypertensive chronic kidney disease with stage 1 through stage 4 chronic kidney disease, or unspecified chronic kidney disease: Secondary | ICD-10-CM | POA: Diagnosis not present

## 2022-12-20 DIAGNOSIS — R110 Nausea: Secondary | ICD-10-CM | POA: Diagnosis not present

## 2022-12-20 LAB — CBC WITH DIFFERENTIAL/PLATELET
Abs Immature Granulocytes: 0.05 10*3/uL (ref 0.00–0.07)
Basophils Absolute: 0 10*3/uL (ref 0.0–0.1)
Basophils Relative: 0 %
Eosinophils Absolute: 0 10*3/uL (ref 0.0–0.5)
Eosinophils Relative: 0 %
HCT: 44.3 % (ref 36.0–46.0)
Hemoglobin: 14.3 g/dL (ref 12.0–15.0)
Immature Granulocytes: 1 %
Lymphocytes Relative: 15 %
Lymphs Abs: 1.4 10*3/uL (ref 0.7–4.0)
MCH: 30.2 pg (ref 26.0–34.0)
MCHC: 32.3 g/dL (ref 30.0–36.0)
MCV: 93.7 fL (ref 80.0–100.0)
Monocytes Absolute: 0.4 10*3/uL (ref 0.1–1.0)
Monocytes Relative: 4 %
Neutro Abs: 7.4 10*3/uL (ref 1.7–7.7)
Neutrophils Relative %: 80 %
Platelets: 204 10*3/uL (ref 150–400)
RBC: 4.73 MIL/uL (ref 3.87–5.11)
RDW: 16.5 % — ABNORMAL HIGH (ref 11.5–15.5)
WBC: 9.3 10*3/uL (ref 4.0–10.5)
nRBC: 0 % (ref 0.0–0.2)

## 2022-12-20 LAB — COMPREHENSIVE METABOLIC PANEL
ALT: 27 U/L (ref 0–44)
AST: 40 U/L (ref 15–41)
Albumin: 3.7 g/dL (ref 3.5–5.0)
Alkaline Phosphatase: 54 U/L (ref 38–126)
Anion gap: 10 (ref 5–15)
BUN: 16 mg/dL (ref 8–23)
CO2: 25 mmol/L (ref 22–32)
Calcium: 9.1 mg/dL (ref 8.9–10.3)
Chloride: 101 mmol/L (ref 98–111)
Creatinine, Ser: 1.14 mg/dL — ABNORMAL HIGH (ref 0.44–1.00)
GFR, Estimated: 48 mL/min — ABNORMAL LOW (ref >60)
Glucose, Bld: 104 mg/dL — ABNORMAL HIGH (ref 70–99)
Potassium: 4.7 mmol/L (ref 3.5–5.1)
Sodium: 136 mmol/L (ref 135–145)
Total Bilirubin: 0.7 mg/dL (ref 0.3–1.2)
Total Protein: 7 g/dL (ref 6.5–8.1)

## 2022-12-20 LAB — SEDIMENTATION RATE: Sed Rate: 17 mm/hr (ref 0–22)

## 2022-12-20 LAB — C-REACTIVE PROTEIN: CRP: <0.5 mg/dL (ref <1.0)

## 2022-12-20 MED ORDER — LABETALOL HCL 5 MG/ML IV SOLN
10.0000 mg | Freq: Once | INTRAVENOUS | Status: AC
  Administered 2022-12-20: 10 mg via INTRAVENOUS
  Filled 2022-12-20: qty 4

## 2022-12-20 MED ORDER — LACTATED RINGERS IV BOLUS
1000.0000 mL | Freq: Once | INTRAVENOUS | Status: AC
  Administered 2022-12-20: 1000 mL via INTRAVENOUS

## 2022-12-20 MED ORDER — METOCLOPRAMIDE HCL 5 MG/ML IJ SOLN
10.0000 mg | Freq: Once | INTRAMUSCULAR | Status: AC
  Administered 2022-12-20: 10 mg via INTRAVENOUS
  Filled 2022-12-20: qty 2

## 2022-12-20 MED ORDER — DEXAMETHASONE SODIUM PHOSPHATE 10 MG/ML IJ SOLN
10.0000 mg | Freq: Once | INTRAMUSCULAR | Status: AC
  Administered 2022-12-20: 10 mg via INTRAVENOUS
  Filled 2022-12-20: qty 1

## 2022-12-20 MED ORDER — DIPHENHYDRAMINE HCL 50 MG/ML IJ SOLN
12.5000 mg | Freq: Once | INTRAMUSCULAR | Status: AC
  Administered 2022-12-20: 12.5 mg via INTRAVENOUS
  Filled 2022-12-20: qty 1

## 2022-12-20 NOTE — ED Provider Notes (Signed)
[facility] EMERGENCY DEPARTMENT Provider Note   CSN: [id number] Arrival date & time: 12/20/22  1704     History  Chief Complaint  Patient presents with   Migraine    Alexis Shelton is a 81 y.o. female.  Past medical history of osteoarthritis, lumbar degenerative joint disease, dizziness, gallstones, hearing loss, cerebellar stroke, hypertension, hyperlipidemia, hypothyroidism, gout, asthma, rheumatoid arthritis, stage III CKD, vitamin D deficiency.  Presents to the emergency today with chief complaint of headache.  Has a history of migraines when she was younger, has not had one like this in a year.  Was at the dentist when all of a sudden she started to have the right eye visual field deficit which she just describes as a scintillating scotoma, which resided and then shortly thereafter had acute onset extremely painful headache on the right side.  Associated with nausea, had an episode of emesis EMS gave her Zofran.  Still feeling nauseous.  Describes the headache as typical compared to her prior migraines, located on the right side of the head.  Not having any visual changes now.  Denies any jaw pain or claudication.  Denies any extremity numbness, weakness, tingling.  Did have a fall about a week ago in which she fell on her bottom and hit the right side of her face.  Had a head CT at that time which was normal reportedly.  Denies any infectious symptoms such as fevers, chills, night sweats, cough.  Denies shortness of breath or chest pain.  HPI     Home Medications Prior to Admission medications   Medication Sig Start Date End Date Taking? Authorizing Provider  metoprolol tartrate (LOPRESSOR) 50 MG tablet Take 0.5 tablets (25 mg total) by mouth 2 (two) times daily. 12/20/22  Yes Phyllis Ginger, MD  albuterol (VENTOLIN HFA) 108 (90 Base) MCG/ACT inhaler Inhale 2 puffs into the lungs every 6 (six) hours as needed for wheezing or shortness of breath. 10/22/19    Hilts, Legrand Como, MD  allopurinol (ZYLOPRIM) 100 MG tablet Take 1 tablet (100 mg total) by mouth 2 (two) times daily. 12/06/22   Bo Merino, MD  aspirin 81 MG EC tablet Take 81 mg by mouth daily. Swallow whole.    [provider]  budesonide-formoterol (SYMBICORT) 160-4.5 MCG/ACT inhaler Inhale 2 puffs into the lungs daily as needed (sob/wheezing). 10/23/22   Janith Lima, MD  Clobetasol Propionate 0.05 % shampoo Apply 1 Act topically daily. 10/26/22   Janith Lima, MD  levothyroxine (SYNTHROID) 88 MCG tablet TAKE 1 TABLET BY MOUTH EVERY DAY BEFORE BREAKFAST 10/22/22   [place], Blondell Reveal, MD  modafinil (PROVIGIL) 100 MG tablet Take 1 tablet (100 mg total) by mouth daily. 11/14/22   Janith Lima, MD  montelukast (SINGULAIR) 10 MG tablet TAKE 1 TABLET BY MOUTH EVERYDAY AT BEDTIME 09/10/22   [place], Blondell Reveal, MD  Multiple Vitamin (MULTIVITAMIN) tablet Take 1 tablet by mouth daily.    [provider]  omeprazole (PRILOSEC) 40 MG capsule TAKE 1 CAPSULE (40 MG TOTAL) BY MOUTH DAILY 30 MINS BEFORE BREAKFAST 10/05/22   Janith Lima, MD  rosuvastatin (CRESTOR) 10 MG tablet TAKE 1 TABLET BY MOUTH EVERY DAY 09/11/22   Janith Lima, MD  traMADol (ULTRAM) 50 MG tablet Take 1 tablet (50 mg total) by mouth every 6 (six) hours as needed. 10/05/22   Janith Lima, MD      Allergies    Patient has no known allergies.  Review of Systems   Review of Systems  Physical Exam Updated Vital Signs BP (!) 147/65   Pulse 69   Temp 98.4 F (36.9 C)   Resp 20   Ht _0  (1.676 m)   Wt 77.1 kg   SpO2 95%   BMI 27.44 kg/m  Physical Exam Vitals and nursing note reviewed.  Constitutional:      General: She is not in acute distress.    Appearance: She is well-developed.  HENT:     Head: Normocephalic and atraumatic.     Comments: Right temporal tenderness to palpation, right TMJ nontender Eyes:     Conjunctiva/sclera: Conjunctivae normal.  Cardiovascular:     Rate and  Rhythm: Normal rate and regular rhythm.     Heart sounds: No murmur heard. Pulmonary:     Effort: Pulmonary effort is normal. No respiratory distress.     Breath sounds: Normal breath sounds.  Abdominal:     Palpations: Abdomen is soft.     Tenderness: There is no abdominal tenderness.  Musculoskeletal:        General: No swelling.     Cervical back: Neck supple.  Skin:    General: Skin is warm and dry.     Capillary Refill: Capillary refill takes less than 2 seconds.  Neurological:     General: No focal deficit present.     Mental Status: She is alert and oriented to person, place, and time.     GCS: GCS eye subscore is 4. GCS verbal subscore is 5. GCS motor subscore is 6.     Cranial Nerves: Cranial nerves 2-12 are intact. No cranial nerve deficit, dysarthria or facial asymmetry.     Sensory: Sensation is intact. No sensory deficit.     Motor: Motor function is intact. No weakness, tremor or abnormal muscle tone.     Coordination: Coordination is intact. Finger-Nose-Finger Test and Heel to [facility] Test normal.  Psychiatric:        Mood and Affect: Mood normal.     ED Results / Procedures / Treatments   Labs (all labs ordered are listed, but only abnormal results are displayed) Labs Reviewed  CBC WITH DIFFERENTIAL/PLATELET - Abnormal; Notable for the following components:      Result Value   RDW 16.5 (*)    All other components within normal limits  COMPREHENSIVE METABOLIC PANEL - Abnormal; Notable for the following components:   Glucose, Bld 104 (*)    Creatinine, Ser 1.14 (*)    GFR, Estimated 48 (*)    All other components within normal limits  SEDIMENTATION RATE  C-REACTIVE PROTEIN  TROPONIN I (HIGH SENSITIVITY)  TROPONIN I (HIGH SENSITIVITY)    EKG None  Radiology DG Chest 2 View  Result Date: 12/20/2022 CLINICAL DATA:  Hypertension EXAM: CHEST - 2 VIEW COMPARISON:  12/12/2021 FINDINGS: Transverse diameter of heart is increased. There are no signs of alveolar  pulmonary edema. Increased markings are seen in left lower lung field. There is no focal consolidation. There is no pleural effusion or pneumothorax. IMPRESSION: Subtle increase in interstitial markings in left lower lung field may suggest crowding of markings due to poor inspiration or subsegmental atelectasis and possibly early interstitial pneumonia. Electronically Signed   By: [name] Picker M.D.   On: 12/20/2022 20:07   CT Head Wo Contrast  Result Date: 12/20/2022 CLINICAL DATA:  Headache EXAM: CT HEAD WITHOUT CONTRAST TECHNIQUE: Contiguous axial images were obtained from the base of the skull through the vertex  without intravenous contrast. RADIATION DOSE REDUCTION: This exam was performed according to the departmental dose-optimization program which includes automated exposure control, adjustment of the mA and/or kV according to patient size and/or use of iterative reconstruction technique. COMPARISON:  MRI 11/25/2022 FINDINGS: Brain: Normal anatomic configuration. Parenchymal volume loss is commensurate with the patient's age. Stable mild periventricular white matter changes are present likely reflecting the sequela of small vessel ischemia. Remote right cerebellar infarct noted. No abnormal intra or extra-axial mass lesion or fluid collection. No abnormal mass effect or midline shift. No evidence of acute intracranial hemorrhage or infarct. Ventricular size is normal. Cerebellum unremarkable. Vascular: No asymmetric hyperdense vasculature at the skull base. Skull: Intact Sinuses/Orbits: Paranasal sinuses are clear. Orbits are unremarkable. Other: Mastoid air cells and middle ear cavities are clear. IMPRESSION: 1. No acute intracranial hemorrhage or infarct. 2. Stable mild senescent change. 3. Remote right cerebellar infarct.  The Electronically Signed   By: Fidela Salisbury M.D.   On: 12/20/2022 18:59    Procedures Procedures    Medications Ordered in ED Medications  lactated ringers bolus  1,000 mL (0 mLs Intravenous Stopped 12/20/22 1924)  diphenhydrAMINE (BENADRYL) injection 12.5 mg (12.5 mg Intravenous Given 12/20/22 1855)  dexamethasone (DECADRON) injection 10 mg (10 mg Intravenous Given 12/20/22 1856)  metoCLOPramide (REGLAN) injection 10 mg (10 mg Intravenous Given 12/20/22 1854)  labetalol (NORMODYNE) injection 10 mg (10 mg Intravenous Given 12/20/22 1954)    ED Course/ Medical Decision Making/ A&P Clinical Course as of 12/21/22 0108  Thu Dec 20, 2022  2059 81 yo female presenting for migraine. BP 219/87 on arrival. Concern for hypertensive emergency versus hypertensive urgency. Cardiac and renal function ordered to r/o end organ ischemia. Pt given medication for bp control and migraine cocktail. CT head stable. No s/s stroke. On re-eval patient admits to complete resolution of headache. Labs pending.  [AG]    Clinical Course User Index [AG] Lianne Cure, DO                           Medical Decision Making Problems Addressed: Other migraine without status migrainosus, not intractable: acute illness or injury that poses a threat to life or bodily functions Secondary hypertension: acute illness or injury that poses a threat to life or bodily functions  Amount and/or Complexity of Data Reviewed Labs: ordered. Decision-making details documented in ED Course. Radiology: ordered and independent interpretation performed. Decision-making details documented in ED Course.  Risk Prescription drug management.   Alexis Shelton is a 81 y.o. female with history of steoarthritis, lumbar degenerative joint disease, dizziness, gallstones, hearing loss, cerebellar stroke, hypertension, hyperlipidemia, hypothyroidism, gout, asthma, rheumatoid arthritis, stage III CKD, vitamin D deficiency who presents with headache. This pt is in nad, afvss, non-toxic appearing.    HA onset was fast, but not truly quick or thunderclap, lower suspicion for ICH.  Pt has no focal neuro sx,  neuro exam is wnl, no visual disturbance, no dizziness/lightheadedness and HA is described as typical HA, lower suspicion for intracranial abnormality such as aneurysm, mass or intracranial bleed.  No associated neck pain, do not suspect vertebral artery or carotid artery dissection.    No infectious sx, no meningismus, afebrile, no ams, doubt meningitis.  No sinus ttp, doubt sinusitis. There is nothing on hx or exam to give me c/f dental or ear etiology.  She does have tenderness over temporal artery, I do have suspicion for temporal arteritis.  Will obtain CBC,  CMP, ESR, CRP.  No tearing or eye pain, doubt cluster HA.  Nothing in hx to concern me for CO poisoning.  No hyperesthesia or rash to concern me for zoster.    Pt describes HA as their typical HA and most likely migraine HA, however due to her temporal artery tenderness to palpation and acute onset of the headache in the light of a trauma within the past week, will obtain head CT and labs.  Will initiate treatment of her head ache with 1 L LR, IV Reglan, IV Benadryl, IV Decadron.  Additionally, due to her significant hypertension, will evaluate for and make sure she does not have signs of endorgan dysfunction and hypertensive emergency.  Will get labs with troponin and chest x-ray.  Labs reviewed, normal CBC and CMP.  Normal ESR and CRP.  Troponin never resulted.  Called lab and there was an issue with the troponin itself.  On my reassessment, she is doing much better after the interventions.  Gave labetalol for her hypertension, she is down into normotensive range now.  She was previously on Norvasc and metoprolol.  Still has Norvasc at home, does not have metoprolol.  Pending normal troponin to rule out hypertensive emergency, this is likely hypertensive urgency with a migraine headache.  As long as the troponin is normal, will plan for discharge with metoprolol 25 mg twice daily.  Encouraging her to follow-up with her primary care provider  for her antihypertensive management.  We have discussed the diagnosis and risks, and we agree with discharging home to follow-up with their primary doctor. We also discussed returning to the Emergency Department immediately if new or worsening symptoms occur. We have discussed the symptoms which are most concerning (e.g., changing or worsening pain, weakness, vomiting, fever, or abnormal sensation) that necessitate immediate return.         Final Clinical Impression(s) / ED Diagnoses Final diagnoses:  Other migraine without status migrainosus, not intractable  Secondary hypertension    Rx / DC Orders ED Discharge Orders          Ordered    metoprolol tartrate (LOPRESSOR) 50 MG tablet  2 times daily        12/21/22 0029              Phyllis Ginger, MD 66/06/30 1601    Lianne Cure, DO 09/32/35 5732    Lianne Cure, DO 20/25/42 [phone]

## 2022-12-20 NOTE — Discharge Instructions (Addendum)
Alexis Shelton:  Thank you for allowing [place] to take care of you today.  We hope you begin feeling better soon. You were seen today for headache which resolved with IV medications.  I am glad you are feeling better.  Please follow-up with your primary doctor regarding her hypertension.  I am going to send you on a prescription for metoprolol.  I want you to restart this medication as well as your Norvasc.  To-Do:  Please follow-up with your primary doctor within the next 2-3 days. It is important that you review any labs or imaging results (if any) that you had today with them. Your preliminary imaging results (if any) are attached. Please return to the Emergency Department or call 911 if you experience chest pain, shortness of breath, severe pain, severe fever, altered mental status, or have any reason to think that you need emergency medical care.  Thank you again.  Hope you feel better soon.  Gust Brooms, MD Department of Emergency Medicine

## 2022-12-20 NOTE — ED Triage Notes (Signed)
Patient arrived to the ED via EMS with complaints of migraine. Patient states she has a history of migraines when she was younger, but hasn't had a migraine like this in some time. Patient states she was at the dentist. Patient states she has no vision problem at the moment. EMS reports she had complaints of nausea, to which she was giving zofran. Patient is A&Ox4.

## 2022-12-20 NOTE — ED Notes (Signed)
Patient transported to CT 

## 2022-12-20 NOTE — ED Notes (Signed)
Dr Wallace Cullens notified of patient's hypertension at this time

## 2022-12-20 NOTE — ED Notes (Signed)
Patient returned from CT

## 2022-12-20 NOTE — ED Notes (Signed)
Patient transported to X-ray 

## 2022-12-21 ENCOUNTER — Other Ambulatory Visit: Payer: Self-pay | Admitting: Rheumatology

## 2022-12-21 ENCOUNTER — Other Ambulatory Visit: Payer: Self-pay | Admitting: Internal Medicine

## 2022-12-21 DIAGNOSIS — M1A09X0 Idiopathic chronic gout, multiple sites, without tophus (tophi): Secondary | ICD-10-CM

## 2022-12-21 DIAGNOSIS — R400 Somnolence: Secondary | ICD-10-CM

## 2022-12-21 DIAGNOSIS — M5136 Other intervertebral disc degeneration, lumbar region: Secondary | ICD-10-CM

## 2022-12-21 DIAGNOSIS — I10 Essential (primary) hypertension: Secondary | ICD-10-CM

## 2022-12-21 DIAGNOSIS — M19041 Primary osteoarthritis, right hand: Secondary | ICD-10-CM

## 2022-12-21 DIAGNOSIS — M5030 Other cervical disc degeneration, unspecified cervical region: Secondary | ICD-10-CM

## 2022-12-21 LAB — TROPONIN I (HIGH SENSITIVITY)
Troponin I (High Sensitivity): 11 ng/L (ref <18)
Troponin I (High Sensitivity): 15 ng/L (ref <18)

## 2022-12-21 MED ORDER — METOPROLOL TARTRATE 50 MG PO TABS: 25.0000 mg | ORAL_TABLET | Freq: Two times a day (BID) | ORAL | 0 refills | Status: DC

## 2022-12-21 NOTE — ED Provider Notes (Signed)
Blood pressure (!) 147/65, pulse 69, temperature 98.4 F (36.9 C), resp. rate 20, height 5\' 6"  (1.676 m), weight 77.1 kg, SpO2 95 %.  Assuming care from Dr. .  In short, Alexis Shelton is a 81 y.o. female with a chief complaint of Migraine .  Refer to the original H&P for additional details.  The current plan of care is to troponin and d/c.  01:40 AM  Troponin negative. Stable for d/c.    91, MD 12/21/22 [phone]

## 2022-12-25 MED ORDER — ONE-DAILY MULTI VITAMINS PO TABS: 1.0000 | ORAL_TABLET | Freq: Every day | ORAL | 1 refills | Status: DC

## 2022-12-25 MED ORDER — ASPIRIN 81 MG PO TBEC: 81.0000 mg | DELAYED_RELEASE_TABLET | Freq: Every day | ORAL | 0 refills | Status: DC

## 2022-12-25 MED ORDER — TRAMADOL HCL 50 MG PO TABS: 50.0000 mg | ORAL_TABLET | Freq: Four times a day (QID) | ORAL | 0 refills | Status: DC | PRN

## 2022-12-25 MED ORDER — MODAFINIL 100 MG PO TABS: 100.0000 mg | ORAL_TABLET | Freq: Every day | ORAL | 0 refills | Status: DC

## 2022-12-26 ENCOUNTER — Encounter: Payer: Self-pay | Admitting: *Deleted

## 2022-12-26 ENCOUNTER — Telehealth: Payer: Self-pay | Admitting: *Deleted

## 2022-12-26 NOTE — Patient Outreach (Addendum)
  Care Coordination TOC Note Transition Care Management Unsuccessful Follow-up Telephone Call  Date of discharge and from where:  ED visit 12/28-19/2023; HTN urgency; migraine headache; ED EMMI Red Alert notification: "No scheduled follow up"  Attempts:  1st Attempt  Reason for unsuccessful TCM follow-up call:  Left voice message attempted calls to both numbers on file-- preferred number would not go through; left voice message on number listed as home number  Oneta Rack, RN, BSN, CCRN Alumnus RN CM Care Coordination/ Transition of [place] Management [phone]: direct office

## 2022-12-27 ENCOUNTER — Telehealth: Payer: Self-pay | Admitting: *Deleted

## 2022-12-27 ENCOUNTER — Encounter: Payer: Self-pay | Admitting: *Deleted

## 2022-12-27 NOTE — Patient Outreach (Addendum)
  Care Coordination TOC Note Transition Care Management Follow-up Telephone Call Date of discharge and from where: 12/22/23 ED Visit [facility]; HTN emergency; migraine headache; ED EMMI Red Alert notification: "No scheduled follow up" How have you been since you were released from the hospital? "I am doing great- not having any more problems; taking the new medication like they told me to and checking my blood pressures here at home regularly every day-- all the blood pressure readings since the ER visit are normal, between 130-150/60-70.  I am very independent and do everything for myself that needs to be done to care for myself." Any questions or concerns? No  Items Reviewed: Did the pt receive and understand the discharge instructions provided? Yes  Medications obtained and verified? Yes  full medication review completed with patient today- no discrepancies identified; patient self-manages medications and reports 100% adherence and patient denies concerns/ questions around medications Other? No  Any new allergies since your discharge? No  Dietary orders reviewed? Yes Do you have support at home? Yes  patient reports she is independent in self care; adult local son assists as indicated  Home Care and Equipment/Supplies: Were home health services ordered? no If so, what is the name of the agency? N/A  Has the agency set up a time to come to the patient's home? not applicable Were any new equipment or medical supplies ordered?  No What is the name of the medical supply agency? N/A Were you able to get the supplies/equipment? not applicable Do you have any questions related to the use of the equipment or supplies? No N/A  Functional Questionnaire: (I = Independent and D = Dependent) ADLs: I  Bathing/Dressing- I  Meal Prep- I  Eating- I  Maintaining continence- I  Transferring/Ambulation- I  Managing Meds- I  Follow up appointments reviewed:  PCP Hospital f/u appt confirmed?  Yes  Scheduled to see PCP, Dr. Ronnald Ramp on Wednesday 01/02/23 @ 2:20 pm Specialist Hospital f/u appt confirmed? No  Scheduled to see - on - @ - Are transportation arrangements needed? No  If their condition worsens, is the pt aware to call PCP or go to the Emergency Dept.? Yes Was the patient provided with contact information for the PCP's office or ED? No- declined- reports already has contact information for all care providers Was to pt encouraged to call back with questions or concerns? Yes- provided patient with my direct contact information should needs/ questions/ concerns arise in the future  SDOH assessments and interventions completed:   Yes SDOH Interventions Today    Flowsheet Row Most Recent Value  SDOH Interventions   Food Insecurity Interventions Intervention Not Indicated  Transportation Interventions Intervention Not Indicated  [drives self]      Care Coordination Interventions:  Reviewed recent blood pressures at home; discussed value around recording these on paper to share with PCP at time of upcoming post-ED office visit next week; provided education/ reinforcement of following hear healthy/ low sodium diet; full medication review completed    Encounter Outcome:  Pt. Visit Completed    Oneta Rack, RN, BSN, CCRN Alumnus RN CM Care Coordination/ Transition of [place] Management [phone]: direct office

## 2023-01-02 ENCOUNTER — Ambulatory Visit: Payer: Medicare HMO | Admitting: Internal Medicine

## 2023-01-07 ENCOUNTER — Ambulatory Visit (INDEPENDENT_AMBULATORY_CARE_PROVIDER_SITE_OTHER): Payer: Medicare HMO | Admitting: Psychology

## 2023-01-07 DIAGNOSIS — F3289 Other specified depressive episodes: Secondary | ICD-10-CM | POA: Diagnosis not present

## 2023-01-07 NOTE — Progress Notes (Signed)
[place] Counselor/Therapist Progress Note  Patient ID: Alexis Shelton, MRN: [id number]    Date: 01/07/23  Time Spent: 12:34  pm - 1:32 pm : 58 Minutes  Treatment Type: Individual Therapy.  Reported Symptoms: anxiety and depression.   Mental Status Exam: Appearance:  Neat and Well Groomed     Behavior: Appropriate  Motor: Normal  Speech/Language:  Clear and Coherent  Affect: Congruent  Mood: anxious and dysthymic  Thought process: normal  Thought content:   WNL  Sensory/Perceptual disturbances:   WNL  Orientation: oriented to person, place, time/date, and situation  Attention: Good  Concentration: Good  Memory: WNL  Fund of knowledge:  Good  Insight:   Good  Judgment:  Good  Impulse Control: Good   Risk Assessment: Danger to Self:  No Self-injurious Behavior: No Danger to Others: No Duty to Warn:no Physical Aggression / Violence:No  Access to Firearms a concern: No  Gang Involvement:No   Subjective:   Alexis Shelton participated in the session, in person in the office with the therapist, and consented to treatment Alexis Shelton reviewed the events of the past week. She noted continued strain with her son and noted the stress related to this lack of contact by her son, as a result.Alexis Shelton noted some positive communication between the two. She noted an upcoming transition, moving, and noted stressors related to this superficially space and cost. She noted difficulty making the choice as to where to live that fits her needs. She noted some of the source of the strain between her and her son including moving to the [place] after she and his father divorced. She noted getting married to his father 6 months after meeting. She noted often living apart due to his work. She noted her then husband's disclosure that he was non-monogamous. She noted her attempts to manage frustration and guilt of past decisions. She noted feeling as though she "abandoned" her son during his  college career as she traveled for work but we worked on Cytogeneticist this perspective not being confirmed by her son.  We began processing this during the session. We will continue to process her relationship with her son, going forward, and her goals for possible resolution. She noted getting a part-time job and is working on the training necessary to start the position.  Therapist validated and normalized Alexis Shelton's feelings and experience and provided supportive therapy.   Interventions: Interpersonal & ACT  Diagnosis:   Other depression  Psychiatric Treatment: No   Treatment Plan:  Client Abilities/Strengths Alexis Shelton is verbose and motivated for change.   Support System: Friend: [name]  Client Treatment Preferences Outpatient therapy.  Client Statement of Needs Alexis Shelton would like to process relationships with son and sister, managing her overall symptoms, manage overall stressors, including financials, and process past events.  Additionally, processing aging, feeling lonely, and financial stressors.   Treatment Level Weekly  Symptoms  Depression: loss of interest, feeling down, difficulty sleeping (middle insomnia) ,lethargy, fluctuating appetite (poor appetite), , trouble concentration, psychomotor retardation, and denied history of SI   (Status: maintained) Anxiety: Feeling nervous, difficulty managing worry, worrying about different things, trouble relaxing, difficulty sitting still, easily irritable, feeling afraid something awful might happen.    (Status: maintained)  Goals:   Alexis Shelton experiences symptoms of depression and anxiety.    Target Date: 12/05/23 Frequency: Weekly  Progress: 0 Modality: individual    Therapist will provide referrals for additional resources as appropriate.  Therapist will provide psycho-education regarding Alexis Shelton's diagnosis and corresponding  treatment approaches and interventions. Licensed Clinical Social Worker, [place], LCSW will support the  patient's ability to achieve the goals identified. will employ CBT, BA, Problem-solving, Solution Focused, Mindfulness,  coping skills, & other evidenced-based practices will be used to promote progress towards healthy functioning to help manage decrease symptoms associated with her diagnosis.   Reduce overall level, frequency, and intensity of the feelings of depression and anxiety evidenced by decreased overall symptoms from 6 to 7 days/week to 0 to 1 days/week per client report for at least 3 consecutive months. Verbally express understanding of the relationship between feelings of depression, anxiety and their impact on thinking patterns and behaviors. Verbalize an understanding of the role that distorted thinking plays in creating fears, excessive worry, and ruminations.  Alexis Shelton participated in the creation of the treatment plan)    Buena Irish, LCSW

## 2023-01-09 ENCOUNTER — Ambulatory Visit: Payer: Medicare HMO | Admitting: Nurse Practitioner

## 2023-01-10 ENCOUNTER — Ambulatory Visit (INDEPENDENT_AMBULATORY_CARE_PROVIDER_SITE_OTHER): Payer: Medicare HMO | Admitting: Internal Medicine

## 2023-01-10 ENCOUNTER — Encounter: Payer: Self-pay | Admitting: Internal Medicine

## 2023-01-10 VITALS — BP 136/60 | HR 62 | Temp 98.1°F | Resp 16 | Ht 66.0 in | Wt 172.0 lb

## 2023-01-10 DIAGNOSIS — I10 Essential (primary) hypertension: Secondary | ICD-10-CM

## 2023-01-10 DIAGNOSIS — R400 Somnolence: Secondary | ICD-10-CM

## 2023-01-10 DIAGNOSIS — G43909 Migraine, unspecified, not intractable, without status migrainosus: Secondary | ICD-10-CM | POA: Insufficient documentation

## 2023-01-10 DIAGNOSIS — R001 Bradycardia, unspecified: Secondary | ICD-10-CM

## 2023-01-10 MED ORDER — UBRELVY 100 MG PO TABS: 1.0000 | ORAL_TABLET | Freq: Every day | ORAL | 5 refills | Status: DC | PRN

## 2023-01-11 ENCOUNTER — Telehealth: Payer: Self-pay

## 2023-01-11 ENCOUNTER — Telehealth: Payer: Self-pay | Admitting: Internal Medicine

## 2023-01-11 NOTE — Telephone Encounter (Signed)
Patient called requesting a letter to written by Dr. Ronnald Ramp that states that her cat is her emotional support animal and she needs the cat to live with her. Patient stated that she needs this letter for her new apartment complex she is moving to. Patient stated that Dr. Ronnald Ramp has written a letter like this in the past but she needs a new one. Patient is willing to pick up letter whenever its completed. Best callback number for this patient is [phone].

## 2023-01-11 NOTE — Telephone Encounter (Signed)
Key: [id number]  Approved Coverage Starts on: 12/24/2022 - Coverage Ends on: 12/24/2023

## 2023-01-11 NOTE — Progress Notes (Unsigned)
Subjective:  Patient ID: Alexis Shelton, female    DOB: 11-06-1941  Age: 82 y.o. MRN: [id number]  CC: Headache and Hypertension   HPI Sheronda Parran presents for f/up -  She has a migraine headache about once or twice a week.  She denies nausea, vomiting, or paresthesias.  She continues to complain of daytime somnolence and says that modafinil was not helpful.  Outpatient Medications Prior to Visit  Medication Sig Dispense Refill   albuterol (VENTOLIN HFA) 108 (90 Base) MCG/ACT inhaler Inhale 2 puffs into the lungs every 6 (six) hours as needed for wheezing or shortness of breath. 54 g 3   allopurinol (ZYLOPRIM) 100 MG tablet Take 1 tablet (100 mg total) by mouth 2 (two) times daily. 180 tablet 0   aspirin EC 81 MG tablet Take 1 tablet (81 mg total) by mouth daily. Swallow whole. 90 tablet 0   budesonide-formoterol (SYMBICORT) 160-4.5 MCG/ACT inhaler Inhale 2 puffs into the lungs daily as needed (sob/wheezing). 30.6 g 1   Clobetasol Propionate 0.05 % shampoo Apply 1 Act topically daily. 118 mL 2   levothyroxine (SYNTHROID) 88 MCG tablet TAKE 1 TABLET BY MOUTH EVERY DAY BEFORE BREAKFAST 30 tablet 1   metoprolol tartrate (LOPRESSOR) 50 MG tablet Take 0.5 tablets (25 mg total) by mouth 2 (two) times daily. 30 tablet 0   montelukast (SINGULAIR) 10 MG tablet TAKE 1 TABLET BY MOUTH EVERYDAY AT BEDTIME 90 tablet 1   Multiple Vitamin (MULTIVITAMIN) tablet Take 1 tablet by mouth daily. 90 tablet 1   omeprazole (PRILOSEC) 40 MG capsule TAKE 1 CAPSULE (40 MG TOTAL) BY MOUTH DAILY 30 MINS BEFORE BREAKFAST 90 capsule 0   rosuvastatin (CRESTOR) 10 MG tablet TAKE 1 TABLET BY MOUTH EVERY DAY 90 tablet 1   traMADol (ULTRAM) 50 MG tablet Take 1 tablet (50 mg total) by mouth every 6 (six) hours as needed. 270 tablet 0   modafinil (PROVIGIL) 100 MG tablet Take 1 tablet (100 mg total) by mouth daily. 90 tablet 0   No facility-administered medications prior to visit.    ROS Review of Systems   Constitutional: Negative.  Negative for diaphoresis and fatigue.  HENT: Negative.    Eyes: Negative.   Respiratory:  Negative for cough, chest tightness, shortness of breath and wheezing.   Cardiovascular:  Negative for chest pain, palpitations and leg swelling.  Gastrointestinal:  Negative for abdominal pain, diarrhea, nausea and vomiting.  Endocrine: Negative.   Genitourinary: Negative.  Negative for difficulty urinating.  Musculoskeletal: Negative.   Skin: Negative.   Neurological:  Positive for headaches. Negative for weakness.  Hematological:  Negative for adenopathy. Does not bruise/bleed easily.  Psychiatric/Behavioral: Negative.      Objective:  BP 136/60 (BP Location: Right Arm, Patient Position: Sitting, Cuff Size: Large)   Pulse 62   Temp 98.1 F (36.7 C) (Oral)   Resp 16   Ht 5\' 6"  (1.676 m)   Wt 172 lb (78 kg)   SpO2 93%   BMI 27.76 kg/m   BP Readings from Last 3 Encounters:  01/10/23 136/60  12/21/22 (!) 177/71  12/12/22 (!) 148/74    Wt Readings from Last 3 Encounters:  01/10/23 172 lb (78 kg)  12/20/22 170 lb (77.1 kg)  12/12/22 175 lb (79.4 kg)    Physical Exam  Lab Results  Component Value Date   WBC 9.3 12/20/2022   HGB 14.3 12/20/2022   HCT 44.3 12/20/2022   PLT 204 12/20/2022   GLUCOSE 104 (H)  12/20/2022   CHOL 260 (H) 06/06/2022   TRIG 249.0 (H) 06/06/2022   HDL 39.30 06/06/2022   LDLDIRECT 175.0 06/06/2022   LDLCALC 101 (H) 03/06/2021   ALT 27 12/20/2022   AST 40 12/20/2022   NA 136 12/20/2022   K 4.7 12/20/2022   CL 101 12/20/2022   CREATININE 1.14 (H) 12/20/2022   BUN 16 12/20/2022   CO2 25 12/20/2022   TSH 0.96 10/02/2022   INR 1.1 12/08/2021   HGBA1C 6.2 10/02/2022    DG Chest 2 View  Result Date: 12/20/2022 CLINICAL DATA:  Hypertension EXAM: CHEST - 2 VIEW COMPARISON:  12/12/2021 FINDINGS: Transverse diameter of heart is increased. There are no signs of alveolar pulmonary edema. Increased markings are seen in left  lower lung field. There is no focal consolidation. There is no pleural effusion or pneumothorax. IMPRESSION: Subtle increase in interstitial markings in left lower lung field may suggest crowding of markings due to poor inspiration or subsegmental atelectasis and possibly early interstitial pneumonia. Electronically Signed   By: [name] Picker M.D.   On: 12/20/2022 20:07   CT Head Wo Contrast  Result Date: 12/20/2022 CLINICAL DATA:  Headache EXAM: CT HEAD WITHOUT CONTRAST TECHNIQUE: Contiguous axial images were obtained from the base of the skull through the vertex without intravenous contrast. RADIATION DOSE REDUCTION: This exam was performed according to the departmental dose-optimization program which includes automated exposure control, adjustment of the mA and/or kV according to patient size and/or use of iterative reconstruction technique. COMPARISON:  MRI 11/25/2022 FINDINGS: Brain: Normal anatomic configuration. Parenchymal volume loss is commensurate with the patient's age. Stable mild periventricular white matter changes are present likely reflecting the sequela of small vessel ischemia. Remote right cerebellar infarct noted. No abnormal intra or extra-axial mass lesion or fluid collection. No abnormal mass effect or midline shift. No evidence of acute intracranial hemorrhage or infarct. Ventricular size is normal. Cerebellum unremarkable. Vascular: No asymmetric hyperdense vasculature at the skull base. Skull: Intact Sinuses/Orbits: Paranasal sinuses are clear. Orbits are unremarkable. Other: Mastoid air cells and middle ear cavities are clear. IMPRESSION: 1. No acute intracranial hemorrhage or infarct. 2. Stable mild senescent change. 3. Remote right cerebellar infarct.  The Electronically Signed   By: Fidela Salisbury M.D.   On: 12/20/2022 18:59    Assessment & Plan:   Brentlee was seen today for headache and hypertension.  Diagnoses and all orders for this visit:  Episodic migraine- I  recommended that she start using a CGRP antagonist. -     Ubrogepant (UBRELVY) 100 MG TABS; Take 1 tablet (100 mg total) by mouth daily as needed.  Essential hypertension- Her blood pressure is well-controlled and the heart rate is in the low 60s.  Will continue to hold on the beta-blocker and CCB.   I have discontinued Madaline Landfair C. Laine's modafinil. I am also having her start on Ubrelvy. Additionally, I am having her maintain her albuterol, montelukast, rosuvastatin, omeprazole, levothyroxine, budesonide-formoterol, Clobetasol Propionate, allopurinol, metoprolol tartrate, aspirin EC, multivitamin, and traMADol.  Meds ordered this encounter  Medications   Ubrogepant (UBRELVY) 100 MG TABS    Sig: Take 1 tablet (100 mg total) by mouth daily as needed.    Dispense:  8 tablet    Refill:  5     Follow-up: No follow-ups on file.  Scarlette Calico, MD

## 2023-01-13 ENCOUNTER — Other Ambulatory Visit: Payer: Self-pay | Admitting: Internal Medicine

## 2023-01-13 DIAGNOSIS — I10 Essential (primary) hypertension: Secondary | ICD-10-CM

## 2023-01-14 ENCOUNTER — Encounter: Payer: Self-pay | Admitting: Internal Medicine

## 2023-01-14 ENCOUNTER — Telehealth: Payer: Self-pay | Admitting: Internal Medicine

## 2023-01-14 NOTE — Telephone Encounter (Signed)
Pt has been informed that letter is completed and ready for pick up when she is free.  Letter located at the front desk.

## 2023-01-14 NOTE — Telephone Encounter (Signed)
Pt states her [facility] pharmacy sent her a letter stating that she should not take Ubrogepant (UBRELVY) 100 MG TABS because it will reacat badly with her other medications and increase the risk of negative side effects.   Pt would like a call back from Dr. Ronnald Ramp or his nurse for clarification on the medication.   Please call pt at: [phone]

## 2023-01-15 NOTE — Telephone Encounter (Signed)
Pt has been informed and expressed understanding.  

## 2023-01-15 NOTE — Telephone Encounter (Signed)
Pt stated that the Alexis Shelton would interest with her amlodipine. However, I do not see amlodipine listed as a current medication for her. Please advise.

## 2023-01-22 ENCOUNTER — Ambulatory Visit (INDEPENDENT_AMBULATORY_CARE_PROVIDER_SITE_OTHER): Payer: Medicare HMO | Admitting: Psychology

## 2023-01-22 ENCOUNTER — Ambulatory Visit: Payer: Medicare HMO | Admitting: Psychology

## 2023-01-22 DIAGNOSIS — F3289 Other specified depressive episodes: Secondary | ICD-10-CM | POA: Diagnosis not present

## 2023-01-22 NOTE — Progress Notes (Signed)
[facility] Counselor/Therapist Progress Note  Patient ID: Alexis Shelton, MRN: [id number]    Date: 01/22/23  Time Spent: 2:06  pm - 3:02 pm : 56 Minutes  Treatment Type: Individual Therapy.  Reported Symptoms: anxiety and depression.   Mental Status Exam: Appearance:  Neat and Well Groomed     Behavior: Appropriate  Motor: Normal  Speech/Language:  Clear and Coherent  Affect: Congruent  Mood: anxious and dysthymic  Thought process: normal  Thought content:   WNL  Sensory/Perceptual disturbances:   WNL  Orientation: oriented to person, place, time/date, and situation  Attention: Good  Concentration: Good  Memory: WNL  Fund of knowledge:  Good  Insight:   Good  Judgment:  Good  Impulse Control: Good   Risk Assessment: Danger to Self:  No Self-injurious Behavior: No Danger to Others: No Duty to Warn:no Physical Aggression / Violence:No  Access to Firearms a concern: No  Gang Involvement:No   Subjective:   [name] participated in the session, in person in the office with the therapist, and consented to treatment Tishanna reviewed the events of the past week. Alyna noted feeling panicky, which is a new symptom, and discussed this during the session. She noted this due to the learning curve she experiences while training for a new job. She noted worry about her son "leaking" into her stress. She noted watching a recent movie that resonated with her do to the main character having bipolar disorder. She noted son not accepting his own diagnosis. She noted feeling overwhelmed due to the aforementioned concerns and an impending move. She noted a need to resolve concerns including how to afford the move. Her symptoms include feeling nervous, gastro-intestinal, shaky, physical tension. She noted eating somewhat unhealthfully but noted that she is not enjoyed food she typically enjoys. We reviewed her coping skills including distraction, cooking an  experimental meal, watch TV, shopping, completing chores, spends time with Toby (Cat), and calling friends. Therapist provided relaxation exercises, via printout, for reference and review. Therapist encouraged Jaysie to practice the exercises and discontinue should there be any discomfort or pain. She provided history regarding her relationship June who she is estranged from. Therapist validated and normalized Janeen's feelings and experience and provided supportive therapy.   Interventions: Interpersonal & ACT  Diagnosis:   Other depression  Psychiatric Treatment: No   Treatment Plan:  Client Abilities/Strengths Breshae is verbose and motivated for change.   Support System: Friend: [name]  Client Treatment Preferences Outpatient therapy.  Client Statement of Needs Izzabell would like to process relationships with son and sister, managing her overall symptoms, manage overall stressors, including financials, and process past events.  Additionally, processing aging, feeling lonely, and financial stressors.   Treatment Level Weekly  Symptoms  Depression: loss of interest, feeling down, difficulty sleeping (middle insomnia) ,lethargy, fluctuating appetite (poor appetite), , trouble concentration, psychomotor retardation, and denied history of SI   (Status: maintained) Anxiety: Feeling nervous, difficulty managing worry, worrying about different things, trouble relaxing, difficulty sitting still, easily irritable, feeling afraid something awful might happen.    (Status: maintained)  Goals:   Deletha experiences symptoms of depression and anxiety.    Target Date: 12/05/23 Frequency: Weekly  Progress: 0 Modality: individual    Therapist will provide referrals for additional resources as appropriate.  Therapist will provide psycho-education regarding Cleota's diagnosis and corresponding treatment approaches and interventions. Licensed Clinical Social Worker, [place], LCSW will support the  patient's ability to achieve the goals identified. will  employ CBT, BA, Problem-solving, Solution Focused, Mindfulness,  coping skills, & other evidenced-based practices will be used to promote progress towards healthy functioning to help manage decrease symptoms associated with her diagnosis.   Reduce overall level, frequency, and intensity of the feelings of depression and anxiety evidenced by decreased overall symptoms from 6 to 7 days/week to 0 to 1 days/week per client report for at least 3 consecutive months. Verbally express understanding of the relationship between feelings of depression, anxiety and their impact on thinking patterns and behaviors. Verbalize an understanding of the role that distorted thinking plays in creating fears, excessive worry, and ruminations.  Madaline Anding participated in the creation of the treatment plan)    Buena Irish, LCSW     Buena Irish, LCSW

## 2023-01-30 ENCOUNTER — Other Ambulatory Visit: Payer: Self-pay | Admitting: Rheumatology

## 2023-01-30 ENCOUNTER — Other Ambulatory Visit: Payer: Self-pay | Admitting: Internal Medicine

## 2023-01-30 DIAGNOSIS — E039 Hypothyroidism, unspecified: Secondary | ICD-10-CM

## 2023-01-30 DIAGNOSIS — M1A09X0 Idiopathic chronic gout, multiple sites, without tophus (tophi): Secondary | ICD-10-CM

## 2023-01-30 DIAGNOSIS — K2100 Gastro-esophageal reflux disease with esophagitis, without bleeding: Secondary | ICD-10-CM

## 2023-01-31 NOTE — Telephone Encounter (Signed)
Next Visit: 06/04/2023  Last Visit: 12/04/2022  Last Fill: 12/06/2022  DX: Idiopathic chronic gout of multiple sites without tophus   Current Dose per office note 12/04/2022: allopurinol 100 mg 1 tablet by mouth daily.    Labs: 12/20/2022 Glucose 104, Creat. 1.14, GFR 48, RDW 16.5  Okay to refill Allopurinol?

## 2023-02-04 ENCOUNTER — Ambulatory Visit: Payer: Medicare HMO | Admitting: Internal Medicine

## 2023-02-05 ENCOUNTER — Ambulatory Visit (INDEPENDENT_AMBULATORY_CARE_PROVIDER_SITE_OTHER): Payer: Medicare HMO | Admitting: Psychology

## 2023-02-05 DIAGNOSIS — F3289 Other specified depressive episodes: Secondary | ICD-10-CM | POA: Diagnosis not present

## 2023-02-05 NOTE — Progress Notes (Signed)
[facility] Counselor/Therapist Progress Note  Patient ID: Latricia Vallance, MRN: [id number]    Date: 02/05/23  Time Spent: 2:02  pm - 2:52 pm : 50 Minutes  Treatment Type: Individual Therapy.  Reported Symptoms: anxiety and depression.   Mental Status Exam: Appearance:  Neat and Well Groomed     Behavior: Appropriate  Motor: Normal  Speech/Language:  Clear and Coherent  Affect: Congruent  Mood: anxious and dysthymic  Thought process: normal  Thought content:   WNL  Sensory/Perceptual disturbances:   WNL  Orientation: oriented to person, place, time/date, and situation  Attention: Good  Concentration: Good  Memory: WNL  Fund of knowledge:  Good  Insight:   Good  Judgment:  Good  Impulse Control: Good   Risk Assessment: Danger to Self:  No Self-injurious Behavior: No Danger to Others: No Duty to Warn:no Physical Aggression / Violence:No  Access to Firearms a concern: No  Gang Involvement:No   Subjective:   [name] participated in the session, in person in the office with the therapist, and consented to treatment Shannyn reviewed the events of the past week. She noted reading her past assigned tools and noted this being positive. She noted stress in relation to her training for her new job and noted worry about begin discharged from the job. She noted feeling interacted poorly with, by a trainer, who was verbally aggressive. We explored this during the session. She noted missing her grandson's birthday and experiencing complicated feelings regarding this. She noted questioning the possibility of this being possibly due to avoidance. We explored this during the session and discussed the pos and cons of this approach on her mood and overall perspective.d Therapist validated and normalized Jamyiah's feelings and experience and provided supportive therapy.   Interventions: Interpersonal & ACT  Diagnosis:   Other depression  Psychiatric Treatment: No    Treatment Plan:  Client Abilities/Strengths Khandi is verbose and motivated for change.   Support System: Friend: [name]  Client Treatment Preferences Outpatient therapy.  Client Statement of Needs Arceli would like to process relationships with son and sister, managing her overall symptoms, manage overall stressors, including financials, and process past events.  Additionally, processing aging, feeling lonely, and financial stressors.   Treatment Level Weekly  Symptoms  Depression: loss of interest, feeling down, difficulty sleeping (middle insomnia) ,lethargy, fluctuating appetite (poor appetite), , trouble concentration, psychomotor retardation, and denied history of SI   (Status: maintained) Anxiety: Feeling nervous, difficulty managing worry, worrying about different things, trouble relaxing, difficulty sitting still, easily irritable, feeling afraid something awful might happen.    (Status: maintained)  Goals:   Arzetta experiences symptoms of depression and anxiety.    Target Date: 12/05/23 Frequency: Weekly  Progress: 0 Modality: individual    Therapist will provide referrals for additional resources as appropriate.  Therapist will provide psycho-education regarding Lougenia's diagnosis and corresponding treatment approaches and interventions. Licensed Clinical Social Worker, [place], LCSW will support the patient's ability to achieve the goals identified. will employ CBT, BA, Problem-solving, Solution Focused, Mindfulness,  coping skills, & other evidenced-based practices will be used to promote progress towards healthy functioning to help manage decrease symptoms associated with her diagnosis.   Reduce overall level, frequency, and intensity of the feelings of depression and anxiety evidenced by decreased overall symptoms from 6 to 7 days/week to 0 to 1 days/week per client report for at least 3 consecutive months. Verbally express understanding of the relationship between  feelings of depression, anxiety and  their impact on thinking patterns and behaviors. Verbalize an understanding of the role that distorted thinking plays in creating fears, excessive worry, and ruminations.  Madaline Bosler participated in the creation of the treatment plan)    Buena Irish, LCSW

## 2023-02-10 ENCOUNTER — Encounter: Payer: Self-pay | Admitting: Internal Medicine

## 2023-02-10 NOTE — Progress Notes (Unsigned)
    Subjective:    Patient ID: Alexis Shelton, female    DOB: 1941-03-07, 82 y.o.   MRN: [id number]      HPI Alexis Shelton is here for No chief complaint on file.    Migraine headaches - she had them when she was young - now they are coming back.   Has headaches 1-2 times a week   Given ubrelvy   MRI brain 11/25/22: IMPRESSION: 1. No acute intracranial abnormality or significant interval change. 2. Periventricular T2 hyperintensities are mildly advanced for age. The finding is nonspecific but can be seen in the setting of chronic microvascular ischemia, a demyelinating process such as multiple sclerosis, vasculitis, complicated migraine headaches, or as the sequelae of a prior infectious or inflammatory process. 3. Remote cerebellar infarcts are stable, right greater than left.   Medications and allergies reviewed with patient and updated if appropriate.  Current Outpatient Medications on File Prior to Visit  Medication Sig Dispense Refill   albuterol (VENTOLIN HFA) 108 (90 Base) MCG/ACT inhaler Inhale 2 puffs into the lungs every 6 (six) hours as needed for wheezing or shortness of breath. 54 g 3   allopurinol (ZYLOPRIM) 100 MG tablet TAKE 1 TABLET TWICE DAILY 180 tablet 3   aspirin EC 81 MG tablet Take 1 tablet (81 mg total) by mouth daily. Swallow whole. 90 tablet 0   budesonide-formoterol (SYMBICORT) 160-4.5 MCG/ACT inhaler Inhale 2 puffs into the lungs daily as needed (sob/wheezing). 30.6 g 1   Clobetasol Propionate 0.05 % shampoo Apply 1 Act topically daily. 118 mL 2   levothyroxine (SYNTHROID) 88 MCG tablet TAKE 1 TABLET EVERY DAY BEFORE BREAKFAST 90 tablet 0   metoprolol tartrate (LOPRESSOR) 50 MG tablet Take 0.5 tablets (25 mg total) by mouth 2 (two) times daily. 30 tablet 0   montelukast (SINGULAIR) 10 MG tablet TAKE 1 TABLET BY MOUTH EVERYDAY AT BEDTIME 90 tablet 1   Multiple Vitamin (MULTIVITAMIN) tablet Take 1 tablet by mouth daily. 90 tablet 1   omeprazole  (PRILOSEC) 40 MG capsule TAKE 1 CAPSULE EVERY DAY 30 MINUTES BEFORE BREAKFAST 90 capsule 0   rosuvastatin (CRESTOR) 10 MG tablet TAKE 1 TABLET BY MOUTH EVERY DAY 90 tablet 1   traMADol (ULTRAM) 50 MG tablet Take 1 tablet (50 mg total) by mouth every 6 (six) hours as needed. 270 tablet 0   Ubrogepant (UBRELVY) 100 MG TABS Take 1 tablet (100 mg total) by mouth daily as needed. 8 tablet 5   No current facility-administered medications on file prior to visit.    Review of Systems     Objective:  There were no vitals filed for this visit. BP Readings from Last 3 Encounters:  01/10/23 136/60  12/21/22 (!) 177/71  12/12/22 (!) 148/74   Wt Readings from Last 3 Encounters:  01/10/23 172 lb (78 kg)  12/20/22 170 lb (77.1 kg)  12/12/22 175 lb (79.4 kg)   There is no height or weight on file to calculate BMI.    Physical Exam         Assessment & Plan:    See Problem List for Assessment and Plan of chronic medical problems.

## 2023-02-11 ENCOUNTER — Ambulatory Visit (INDEPENDENT_AMBULATORY_CARE_PROVIDER_SITE_OTHER): Payer: Medicare HMO | Admitting: Internal Medicine

## 2023-02-11 VITALS — BP 136/70 | HR 52 | Temp 97.9°F | Ht 66.0 in

## 2023-02-11 DIAGNOSIS — M542 Cervicalgia: Secondary | ICD-10-CM

## 2023-02-11 DIAGNOSIS — E039 Hypothyroidism, unspecified: Secondary | ICD-10-CM

## 2023-02-11 MED ORDER — LEVOTHYROXINE SODIUM 88 MCG PO TABS: ORAL_TABLET | ORAL | 0 refills | Status: DC

## 2023-02-11 MED ORDER — TIZANIDINE HCL 4 MG PO TABS: 4.0000 mg | ORAL_TABLET | Freq: Every evening | ORAL | 0 refills | Status: DC | PRN

## 2023-02-11 NOTE — Patient Instructions (Addendum)
        Medications changes include :   tizanidine 4 mg at bedtime as needed for neck muscle spasms      Return if symptoms worsen or fail to improve.

## 2023-02-26 ENCOUNTER — Ambulatory Visit (INDEPENDENT_AMBULATORY_CARE_PROVIDER_SITE_OTHER): Payer: Medicare HMO | Admitting: Psychology

## 2023-02-26 DIAGNOSIS — F3289 Other specified depressive episodes: Secondary | ICD-10-CM | POA: Diagnosis not present

## 2023-02-26 NOTE — Progress Notes (Signed)
[facility] Counselor/Therapist Progress Note  Patient ID: Alexis Shelton, MRN: [id number]    Date: 02/26/23  Time Spent: 2:03  pm - 2:59 pm : 56 Minutes  Treatment Type: Individual Therapy.  Reported Symptoms: anxiety and depression.   Mental Status Exam: Appearance:  Neat and Well Groomed     Behavior: Appropriate  Motor: Normal  Speech/Language:  Clear and Coherent  Affect: Congruent  Mood: anxious and dysthymic  Thought process: normal  Thought content:   WNL  Sensory/Perceptual disturbances:   WNL  Orientation: oriented to person, place, time/date, and situation  Attention: Good  Concentration: Good  Memory: WNL  Fund of knowledge:  Good  Insight:   Good  Judgment:  Good  Impulse Control: Good   Risk Assessment: Danger to Self:  No Self-injurious Behavior: No Danger to Others: No Duty to Warn:no Physical Aggression / Violence:No  Access to Firearms a concern: No  Gang Involvement:No   Subjective:   Alexis Shelton participated in the session, in person in the office with the therapist, and consented to treatment Alexis Shelton reviewed the events of the past week. Alexis Shelton noted a recent move to a new apartment. She noted receiving feedback  from her friend who she noted improvement in Alexis Shelton's mood. She noted her father being known as "the lonesome cowboy" and was originally from [place]. She noted "moving" with her mother without her father and did not see her father until 17 years later at age 82. She noted being previously close with her father prior to the "move". She later discovered that her father did not have "visitation rights". She noted her father remarrying and having children. Alexis Shelton noted some interest from her half-sister to reconnect but noted some trepidation on her part. She noted her mother not being supportive of Alexis Shelton having contact with her father. She noted visiting her father, at a later date, and noted her perception that her father worried  that Alexis Shelton would be a bad influence on her younger half-sister.  She noted marrying Alexis Shelton, who was in the air [employer], and later disclosed that he was gay. She noted later becoming pregnant by an [place] American and noted her worry of how her mother would react and could not disclose this during the session. She noted having to have an abortion but could not identify resources. She noted getting an abortion and being kicked out of the hospital upon the disclosure that she has an abortion. She noted the realization that she would be on-her-own from then on. She later met someone, married, had a child, and moved to [place]. 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 She noted a needing an entrance exam in the [place]. She noted passing this but having the results nullified due to cheating by others that required the reformulation of the exam which she never passed after two separate attempts. She noted later getting an Masters in [employer]. She noted having to "struggle" with things she's done in her life. She noted being limited, as a child, by her mother's influence " physics is a Copy". She noted her family not being supportive of her medical degree and did not attend her graduation. She noted her friends and family not being interested in her travels and academic and professional accomplishments. She noted working on setting boundaries with her son and noted her efforts to allow her son to initiate contacted. We will continue  to explore this going forward. Therapist praised Alexis Shelton for her effort and energy during the session and provided supportive therapy. She noted a need to be "stabilized" and be less fly by night.    Interventions: Interpersonal  Diagnosis:   Other depression  Psychiatric Treatment: No   Treatment Plan:  Client Abilities/Strengths Alexis Shelton is verbose and motivated for change.   Support System: Friend:  Alexis Shelton  Client Treatment Preferences Outpatient therapy.  Client Statement of Needs Alexis Shelton would like to process relationships with son and sister, managing her overall symptoms, manage overall stressors, including financials, and process past events.  Additionally, processing aging, feeling lonely, and financial stressors.   Treatment Level Weekly  Symptoms  Depression: loss of interest, feeling down, difficulty sleeping (middle insomnia) ,lethargy, fluctuating appetite (poor appetite), , trouble concentration, psychomotor retardation, and denied history of SI   (Status: maintained) Anxiety: Feeling nervous, difficulty managing worry, worrying about different things, trouble relaxing, difficulty sitting still, easily irritable, feeling afraid something awful might happen.    (Status: maintained)  Goals:   Alexis Shelton experiences symptoms of depression and anxiety.    Target Date: 12/05/23 Frequency: Weekly  Progress: 0 Modality: individual    Therapist will provide referrals for additional resources as appropriate.  Therapist will provide psycho-education regarding Alexis Shelton's diagnosis and corresponding treatment approaches and interventions. Licensed Clinical Social Worker, [place], LCSW will support the patient's ability to achieve the goals identified. will employ CBT, BA, Problem-solving, Solution Focused, Mindfulness,  coping skills, & other evidenced-based practices will be used to promote progress towards healthy functioning to help manage decrease symptoms associated with her diagnosis.   Reduce overall level, frequency, and intensity of the feelings of depression and anxiety evidenced by decreased overall symptoms from 6 to 7 days/week to 0 to 1 days/week per client report for at least 3 consecutive months. Verbally express understanding of the relationship between feelings of depression, anxiety and their impact on thinking patterns and behaviors. Verbalize an understanding of the  role that distorted thinking plays in creating fears, excessive worry, and ruminations.  Alexis Shelton participated in the creation of the treatment plan)    Buena Irish, LCSW

## 2023-03-07 ENCOUNTER — Other Ambulatory Visit: Payer: Self-pay | Admitting: Internal Medicine

## 2023-03-07 ENCOUNTER — Other Ambulatory Visit: Payer: Self-pay

## 2023-03-07 DIAGNOSIS — E039 Hypothyroidism, unspecified: Secondary | ICD-10-CM

## 2023-03-12 ENCOUNTER — Ambulatory Visit (INDEPENDENT_AMBULATORY_CARE_PROVIDER_SITE_OTHER): Payer: Medicare HMO | Admitting: Psychology

## 2023-03-12 DIAGNOSIS — F3289 Other specified depressive episodes: Secondary | ICD-10-CM | POA: Diagnosis not present

## 2023-03-12 NOTE — Progress Notes (Signed)
[facility] Counselor/Therapist Progress Note  Patient ID: Teena Lorden, MRN: [id number]    Date: 03/12/23  Time Spent: 2:07  pm - 2:57 pm : 50 Minutes  Treatment Type: Individual Therapy.  Reported Symptoms: anxiety and depression.   Mental Status Exam: Appearance:  Neat and Well Groomed     Behavior: Appropriate  Motor: Normal  Speech/Language:  Clear and Coherent  Affect: Congruent  Mood: anxious and dysthymic  Thought process: normal  Thought content:   WNL  Sensory/Perceptual disturbances:   WNL  Orientation: oriented to person, place, time/date, and situation  Attention: Good  Concentration: Good  Memory: WNL  Fund of knowledge:  Good  Insight:   Good  Judgment:  Good  Impulse Control: Good   Risk Assessment: Danger to Self:  No Self-injurious Behavior: No Danger to Others: No Duty to Warn:no Physical Aggression / Violence:No  Access to Firearms a concern: No  Gang Involvement:No   Subjective:   [name] participated in the session, in person in the office with the therapist, and consented to treatment Jaqulyn reviewed the events of the past week. She noted stressors related to a move including missing mail to her new home. She noted stressors in relation to work related training and needing to learn new computer systems. She noted her attempt to contact her son, via email, to share her address and invite her son to her new place but noted no response. She noted missing her grandson. She noted her assumption that her son is ignoring her email. We explored this and worked on identifying evidence for this. Therapist highlighted the lack of evidence and highlighted a jump to conclusion. She noted her son's current custody dispute and noted uncertainty regarding this experience. We worked on identifying ways to reduce rumination including focusing on goals, identifying feelings, setting time-limits regarding worry, and identifying areas of  control and lack of control. She needed to work on her diet but noted food, generally, not being good. She discussed the possibility that this is due to her medications. We worked on process this during the session and reviewed her coping. Therapist praised Carlan for her effort and energy during the session and provided supportive therapy. She noted a need to be "stabilized" and be less fly by night.    Interventions: CBT and ACT  Diagnosis:   Other depression  Psychiatric Treatment: No   Treatment Plan:  Client Abilities/Strengths Makyra is verbose and motivated for change.   Support System: Friend: [name]  Client Treatment Preferences Outpatient therapy.  Client Statement of Needs Aniecia would like to process relationships with son and sister, managing her overall symptoms, manage overall stressors, including financials, and process past events.  Additionally, processing aging, feeling lonely, and financial stressors.   Treatment Level Weekly  Symptoms  Depression: loss of interest, feeling down, difficulty sleeping (middle insomnia) ,lethargy, fluctuating appetite (poor appetite), , trouble concentration, psychomotor retardation, and denied history of SI   (Status: maintained) Anxiety: Feeling nervous, difficulty managing worry, worrying about different things, trouble relaxing, difficulty sitting still, easily irritable, feeling afraid something awful might happen.    (Status: maintained)  Goals:   Leenah experiences symptoms of depression and anxiety.    Target Date: 12/05/23 Frequency: Weekly  Progress: 0 Modality: individual    Therapist will provide referrals for additional resources as appropriate.  Therapist will provide psycho-education regarding Dulcie's diagnosis and corresponding treatment approaches and interventions. Licensed Clinical Social Worker, [place], LCSW will support the patient's  ability to achieve the goals identified. will employ CBT, BA,  Problem-solving, Solution Focused, Mindfulness,  coping skills, & other evidenced-based practices will be used to promote progress towards healthy functioning to help manage decrease symptoms associated with her diagnosis.   Reduce overall level, frequency, and intensity of the feelings of depression and anxiety evidenced by decreased overall symptoms from 6 to 7 days/week to 0 to 1 days/week per client report for at least 3 consecutive months. Verbally express understanding of the relationship between feelings of depression, anxiety and their impact on thinking patterns and behaviors. Verbalize an understanding of the role that distorted thinking plays in creating fears, excessive worry, and ruminations.  Madaline Winstead participated in the creation of the treatment plan)    Buena Irish, LCSW

## 2023-04-01 ENCOUNTER — Ambulatory Visit: Payer: Self-pay | Admitting: Licensed Clinical Social Worker

## 2023-04-01 NOTE — Patient Instructions (Signed)
  It was a pleasure speaking with you today. Per your request a Care Coordination phone appointment is scheduled 04/02/23  Sammuel Hines, LCSW Social Work Care Coordination  [phone]

## 2023-04-01 NOTE — Patient Outreach (Signed)
  Care Coordination  Follow Up Visit Note   04/01/2023 Name: Alexis Shelton MRN: [id number] DOB: 03-Nov-1941  Alexis Shelton is a 82 y.o. year old female who sees Etta Grandchild, MD for primary care. I spoke with  [name] by phone today.  What matters to the patients health and wellness today?  Received message from office that patient would like LCSW to call.   SDOH assessments and interventions completed:  No   Care Coordination Interventions:  No, not indicated   Follow up plan: Follow up call scheduled for 04/02/23 at 9:00    Encounter Outcome:  Pt. Visit Completed   Sammuel Hines, LCSW Social Work Care Coordination  [facility] [employer] [employer] [phone]

## 2023-04-02 ENCOUNTER — Other Ambulatory Visit: Payer: Self-pay | Admitting: Internal Medicine

## 2023-04-02 ENCOUNTER — Ambulatory Visit: Payer: Self-pay | Admitting: Licensed Clinical Social Worker

## 2023-04-02 ENCOUNTER — Ambulatory Visit: Payer: Medicare HMO | Admitting: Psychology

## 2023-04-02 DIAGNOSIS — M5136 Other intervertebral disc degeneration, lumbar region: Secondary | ICD-10-CM

## 2023-04-02 DIAGNOSIS — M5030 Other cervical disc degeneration, unspecified cervical region: Secondary | ICD-10-CM

## 2023-04-02 DIAGNOSIS — M19042 Primary osteoarthritis, left hand: Secondary | ICD-10-CM

## 2023-04-02 NOTE — Patient Outreach (Signed)
  Care Coordination  Follow Up Visit Note   04/02/2023 Name: Alexis Shelton MRN: [id number] DOB: [birth year]  Alexis Shelton is a 82 y.o. year old female who sees Etta Grandchild, MD for primary care. I spoke with  Alexis Shelton by phone today.  What matters to the patients health and wellness today?  Getting connected with community resources due to financial strain.    Goals Addressed             This Visit's Progress    Connect with community resources       Activities and task to complete in order to accomplish goals.   Call your insurance company to follow up on concerns with your care insurance Follow up on food resources discussed (see list below as well as a list was e-mailed to you per your request) Keep all upcoming appointment discussed today Continue with compliance of taking medication prescribed by Doctor I have placed a referral with [employer] someone will call you from the following agencies   [facility] for food options [employer] [facility] [place] [facility] about employment  [employer] for rental assistance    Food Resources  [employer], [address] (in the building behind the main building) In Person Shopping: Wednesday 12pm-7pm, Friday 11am-2pm, and Saturday 9am-12pm  [employer]  [address] [place] [address] [phone]   Mon- Wed 10:00  to 12:00  [address] [place] [employer] [address], [place], [place], [address] [phone]  Call to schedule a time to pick up food        SDOH assessments and interventions completed:  Yes  SDOH Interventions Today    Flowsheet Row Most Recent Value  SDOH Interventions   Food Insecurity Interventions [employer] Referral  Housing Interventions [employer] Referral  [resources discussed]  Utilities Interventions Intervention Not Indicated  Financial Strain Interventions [employer] Referral   [received rental assistance from [place] [employer] [place]]       Care Coordination Interventions:  Yes, provided  Interventions Today    Flowsheet Row Most Recent Value  Chronic Disease   Chronic disease during today's visit Hypertension (HTN), Chronic Kidney Disease/End Stage Renal Disease (ESRD)  General Interventions   General Interventions Discussed/Reviewed General Interventions Discussed, Programmer, applications, Communication with  Communication with --  [[id number] to place referrals,   [employer] to assist patient with employement for seniors]  Education Interventions   Education Provided Provided Education, Provided Web-based Education  Provided Verbal Education On [employer], When to see the doctor  Mental Health Interventions   Mental Health Discussed/Reviewed Mental Health Reviewed  Francis Dowse continues to see her therapist]  Nutrition Interventions   Nutrition Discussed/Reviewed Nutrition Reviewed  Pharmacy Interventions   Pharmacy Dicussed/Reviewed Medication Adherence  [Reviewed]       Follow up plan:  Patient declined follow up call however LCSW will f/u on referral placed in 1 week.    Encounter Outcome:  Pt. Visit Completed   Sammuel Hines, LCSW Social Work Care Coordination  [facility] [employer] [employer] [phone]

## 2023-04-02 NOTE — Patient Instructions (Signed)
Visit Information  Thank you for taking time to visit with me today. Please don't hesitate to contact me if I can be of assistance to you.   Following are the goals we discussed today:   Goals Addressed             This Visit's Progress    Connect with community resources       Activities and task to complete in order to accomplish goals.   Call your insurance company to follow up on concerns with your care insurance Follow up on food resources discussed (see list below as well as a list was e-mailed to you per your request) Keep all upcoming appointment discussed today Continue with compliance of taking medication prescribed by Doctor I have placed a referral with [employer] someone will call you from the following agencies   [facility] for food options [employer] [place] [facility] about employment  [employer] for rental assistance    Food Resources  [employer], [address] (in the building behind the main building) In Person Shopping: Wednesday 12pm-7pm, Friday 11am-2pm, and Saturday 9am-12pm  [employer]  [address] [place] [address] [phone]   Mon- Wed 10:00  to 12:00  [address] [place] [employer] [address] [place], [place], [address] [phone]  Call to schedule a time to pick up food        Please call the care guide team at [phone] if you need to cancel or reschedule your appointment.   If you are experiencing a Mental Health or Behavioral Health Crisis or need someone to talk to, please call the Suicide and Crisis Lifeline: 988 call the [place] National Suicide Prevention Lifeline: 817-426-4853 or TTY: (678)017-7460 TTY (901) 673-2618) to talk to a trained counselor call 1-800-273-TALK (toll free, 24 hour hotline) go to [facility] [address], [place] [phone])   Patient verbalizes understanding  of instructions and care plan provided today and agrees to view in MyChart. Active MyChart status and patient understanding of how to access instructions and care plan via MyChart confirmed with patient.     No further follow up required: at this time you indicated you would call me if needed  Sammuel Hines, [employer] Social Work Care Coordination  [facility] [employer] [employer] [phone]

## 2023-04-07 ENCOUNTER — Other Ambulatory Visit: Payer: Self-pay | Admitting: Internal Medicine

## 2023-04-09 ENCOUNTER — Other Ambulatory Visit: Payer: Self-pay | Admitting: Internal Medicine

## 2023-04-10 ENCOUNTER — Other Ambulatory Visit: Payer: Self-pay | Admitting: Internal Medicine

## 2023-04-10 ENCOUNTER — Telehealth: Payer: Self-pay | Admitting: Internal Medicine

## 2023-04-10 DIAGNOSIS — M5136 Other intervertebral disc degeneration, lumbar region: Secondary | ICD-10-CM

## 2023-04-10 DIAGNOSIS — E039 Hypothyroidism, unspecified: Secondary | ICD-10-CM

## 2023-04-10 DIAGNOSIS — M19041 Primary osteoarthritis, right hand: Secondary | ICD-10-CM

## 2023-04-10 DIAGNOSIS — I10 Essential (primary) hypertension: Secondary | ICD-10-CM

## 2023-04-10 DIAGNOSIS — M1A09X0 Idiopathic chronic gout, multiple sites, without tophus (tophi): Secondary | ICD-10-CM

## 2023-04-10 DIAGNOSIS — E785 Hyperlipidemia, unspecified: Secondary | ICD-10-CM

## 2023-04-10 DIAGNOSIS — M5030 Other cervical disc degeneration, unspecified cervical region: Secondary | ICD-10-CM

## 2023-04-10 MED ORDER — MONTELUKAST SODIUM 10 MG PO TABS: ORAL_TABLET | ORAL | 1 refills | Status: DC

## 2023-04-10 MED ORDER — METOPROLOL SUCCINATE ER 25 MG PO TB24: 25.0000 mg | ORAL_TABLET | Freq: Every day | ORAL | 0 refills | Status: DC

## 2023-04-10 MED ORDER — TRAMADOL HCL 50 MG PO TABS: 50.0000 mg | ORAL_TABLET | Freq: Four times a day (QID) | ORAL | 1 refills | Status: DC | PRN

## 2023-04-10 MED ORDER — LEVOTHYROXINE SODIUM 88 MCG PO TABS: ORAL_TABLET | ORAL | 0 refills | Status: DC

## 2023-04-10 MED ORDER — ALLOPURINOL 100 MG PO TABS: 100.0000 mg | ORAL_TABLET | Freq: Two times a day (BID) | ORAL | 3 refills | Status: DC

## 2023-04-10 MED ORDER — TIZANIDINE HCL 4 MG PO TABS: ORAL_TABLET | ORAL | 1 refills | Status: DC

## 2023-04-10 MED ORDER — ROSUVASTATIN CALCIUM 10 MG PO TABS: 10.0000 mg | ORAL_TABLET | Freq: Every day | ORAL | 1 refills | Status: DC

## 2023-04-10 NOTE — Telephone Encounter (Signed)
Pt has asked that her medications listed below be sent in to [facility] [place] - [address]  tiZANidine (ZANAFLEX) 4 MG tablet    montelukast (SINGULAIR) 10 MG tablet   levothyroxine (SYNTHROID) 88 MCG tablet   rosuvastatin (CRESTOR) 10 MG tablet    allopurinol (ZYLOPRIM) 100 MG tablet   traMADol (ULTRAM) 50 MG tablet   metoprolol tartrate (LOPRESSOR) 50 MG tablet

## 2023-04-10 NOTE — Telephone Encounter (Signed)
Prescription Request  04/10/2023  LOV: 01/10/2023  What is the name of the medication or equipment? budesonide-formoterol (SYMBICORT) 160-4.5 MCG/ACT inhaler   tiZANidine (ZANAFLEX) 4 MG tablet   montelukast (SINGULAIR) 10 MG tablet    Have you contacted your pharmacy to request a refill? No   Which pharmacy would you like this sent to?    [facility] - [place], [place] - [address] [place] [address] Phone: [phone] Fax: [phone]  Patient notified that their request is being sent to the clinical staff for review and that they should receive a response within 2 business days.   Please advise at Mobile [phone] (mobile)

## 2023-04-13 ENCOUNTER — Other Ambulatory Visit: Payer: Self-pay | Admitting: Internal Medicine

## 2023-04-13 DIAGNOSIS — I10 Essential (primary) hypertension: Secondary | ICD-10-CM

## 2023-04-16 ENCOUNTER — Ambulatory Visit (INDEPENDENT_AMBULATORY_CARE_PROVIDER_SITE_OTHER): Payer: Medicare HMO | Admitting: Psychology

## 2023-04-16 DIAGNOSIS — F3289 Other specified depressive episodes: Secondary | ICD-10-CM

## 2023-04-16 NOTE — Progress Notes (Signed)
[facility] Counselor/Therapist Progress Note  Patient ID: Alexis Shelton, MRN: [id number]    Date: 04/16/23  Time Spent: 12:03  pm - 1:01 pm : 58 Minutes  Treatment Type: Individual Therapy.  Reported Symptoms: anxiety and depression.   Mental Status Exam: Appearance:  Neat and Well Groomed     Behavior: Appropriate  Motor: Normal  Speech/Language:  Clear and Coherent  Affect: Congruent  Mood: anxious and dysthymic  Thought process: normal  Thought content:   WNL  Sensory/Perceptual disturbances:   WNL  Orientation: oriented to person, place, time/date, and situation  Attention: Good  Concentration: Good  Memory: WNL  Fund of knowledge:  Good  Insight:   Good  Judgment:  Good  Impulse Control: Good   Risk Assessment: Danger to Self:  No Self-injurious Behavior: No Danger to Others: No Duty to Warn:no Physical Aggression / Violence:No  Access to Firearms a concern: No  Gang Involvement:No   Subjective:   Alexis Shelton participated in the session, in person in the office with the therapist, and consented to treatment Alexis Shelton reviewed the events of the past week. She noted work related stressors due to her work not paying her on time. She noted feeling significant financial stressors due to having her banking account compromised last year only to have a charge this month from that event. She noted feeling flustered due to losing the address to the therapist's office. She noted sometimes worrying about her memory and forgetfulness. She noted her processing slowing down. She noted work related stressors in relation to task completion and the need to heavily rely on technology. She noted worked on Diplomatic Services operational officer an email to her son, this week, reminiscing about their past in [place]. She noted her son's bipolar disorder and noted reading more about the diagnosis online. Therapist provided additional psycho-education regarding the disorder and praised Alexis Shelton for her  effort to learn more. She noted missing out of her grandson's life due to the lack of contact from her son. She noted her son currently going through a divorce. She noted experiencing knee pain and noted worry that this will worsen and affect her ability to work. We explored her worry during the session and discussed possible ways to address this issue starting with scheduling an appointment with her medical provider. Therapist praised Alexis Shelton for her effort and energy during the session and provided supportive therapy.   Interventions: CBT and interpersonal   Diagnosis:   Other depression  Psychiatric Treatment: No   Treatment Plan:  Client Abilities/Strengths Alexis Shelton is verbose and motivated for change.   Support System: Friend: [name]  Client Treatment Preferences Outpatient therapy.  Client Statement of Needs [place] would like to process relationships with son and sister, managing her overall symptoms, manage overall stressors, including financials, and process past events.  Additionally, processing aging, feeling lonely, and financial stressors.   Treatment Level Weekly  Symptoms  Depression: loss of interest, feeling down, difficulty sleeping (middle insomnia) ,lethargy, fluctuating appetite (poor appetite), , trouble concentration, psychomotor retardation, and denied history of SI   (Status: maintained) Anxiety: Feeling nervous, difficulty managing worry, worrying about different things, trouble relaxing, difficulty sitting still, easily irritable, feeling afraid something awful might happen.    (Status: maintained)  Goals:   Moranda experiences symptoms of depression and anxiety.    Target Date: 12/05/23 Frequency: Weekly  Progress: 0 Modality: individual    Therapist will provide referrals for additional resources as appropriate.  Therapist will provide psycho-education regarding Jenisa's diagnosis  and corresponding treatment approaches and interventions. Licensed Clinical  Social Worker, [place], LCSW will support the patient's ability to achieve the goals identified. will employ CBT, BA, Problem-solving, Solution Focused, Mindfulness,  coping skills, & other evidenced-based practices will be used to promote progress towards healthy functioning to help manage decrease symptoms associated with her diagnosis.   Reduce overall level, frequency, and intensity of the feelings of depression and anxiety evidenced by decreased overall symptoms from 6 to 7 days/week to 0 to 1 days/week per client report for at least 3 consecutive months. Verbally express understanding of the relationship between feelings of depression, anxiety and their impact on thinking patterns and behaviors. Verbalize an understanding of the role that distorted thinking plays in creating fears, excessive worry, and ruminations.  Alexis Shelton participated in the creation of the treatment plan)    Delight Ovens, LCSW

## 2023-04-22 ENCOUNTER — Ambulatory Visit (INDEPENDENT_AMBULATORY_CARE_PROVIDER_SITE_OTHER): Payer: Medicare HMO | Admitting: Internal Medicine

## 2023-04-22 ENCOUNTER — Encounter: Payer: Self-pay | Admitting: Internal Medicine

## 2023-04-22 ENCOUNTER — Other Ambulatory Visit: Payer: Self-pay | Admitting: Internal Medicine

## 2023-04-22 VITALS — BP 126/62 | HR 84 | Temp 98.2°F | Resp 16 | Ht 66.0 in | Wt 173.0 lb

## 2023-04-22 DIAGNOSIS — E785 Hyperlipidemia, unspecified: Secondary | ICD-10-CM | POA: Diagnosis not present

## 2023-04-22 DIAGNOSIS — E039 Hypothyroidism, unspecified: Secondary | ICD-10-CM

## 2023-04-22 DIAGNOSIS — N184 Chronic kidney disease, stage 4 (severe): Secondary | ICD-10-CM

## 2023-04-22 DIAGNOSIS — R197 Diarrhea, unspecified: Secondary | ICD-10-CM | POA: Insufficient documentation

## 2023-04-22 DIAGNOSIS — I10 Essential (primary) hypertension: Secondary | ICD-10-CM | POA: Diagnosis not present

## 2023-04-22 DIAGNOSIS — N1832 Chronic kidney disease, stage 3b: Secondary | ICD-10-CM

## 2023-04-22 DIAGNOSIS — K909 Intestinal malabsorption, unspecified: Secondary | ICD-10-CM | POA: Insufficient documentation

## 2023-04-22 DIAGNOSIS — K2100 Gastro-esophageal reflux disease with esophagitis, without bleeding: Secondary | ICD-10-CM

## 2023-04-22 DIAGNOSIS — M069 Rheumatoid arthritis, unspecified: Secondary | ICD-10-CM | POA: Diagnosis not present

## 2023-04-22 NOTE — Progress Notes (Unsigned)
Subjective:  Patient ID: [name], female    DOB: Mar 09, 1941  Age: 82 y.o. MRN: [id number]  CC: No chief complaint on file.   HPI Chakara Bognar presents for ***  Outpatient Medications Prior to Visit  Medication Sig Dispense Refill   albuterol (VENTOLIN HFA) 108 (90 Base) MCG/ACT inhaler Inhale 2 puffs into the lungs every 6 (six) hours as needed for wheezing or shortness of breath. 54 g 3   allopurinol (ZYLOPRIM) 100 MG tablet Take 1 tablet (100 mg total) by mouth 2 (two) times daily. 180 tablet 3   ASPIRIN LOW DOSE 81 MG tablet TAKE 1 TABLET (81 MG TOTAL) BY MOUTH DAILY. SWALLOW WHOLE. 90 tablet 3   budesonide-formoterol (SYMBICORT) 160-4.5 MCG/ACT inhaler Inhale 2 puffs into the lungs daily as needed (sob/wheezing). 30.6 g 1   levothyroxine (SYNTHROID) 88 MCG tablet TAKE 1 TABLET EVERY DAY BEFORE BREAKFAST 90 tablet 0   metoprolol succinate (TOPROL-XL) 25 MG 24 hr tablet TAKE 1 TABLET (25 MG TOTAL) BY MOUTH DAILY. 90 tablet 0   montelukast (SINGULAIR) 10 MG tablet TAKE 1 TABLET BY MOUTH EVERYDAY AT BEDTIME 90 tablet 1   Multiple Vitamin (MULTIVITAMIN) tablet Take 1 tablet by mouth daily. 90 tablet 1   rosuvastatin (CRESTOR) 10 MG tablet Take 1 tablet (10 mg total) by mouth daily. 90 tablet 1   tiZANidine (ZANAFLEX) 4 MG tablet TAKE 1 TABLET BY MOUTH AT BEDTIME AS NEEDED FOR MUSCLE SPASMS. 90 tablet 1   traMADol (ULTRAM) 50 MG tablet Take 1 tablet (50 mg total) by mouth every 6 (six) hours as needed. 120 tablet 1   No facility-administered medications prior to visit.    ROS Review of Systems  Objective:  BP 126/62 (BP Location: Left Arm, Patient Position: Sitting, Cuff Size: Large)   Pulse 84   Temp 98.2 F (36.8 C) (Oral)   Ht 5\' 6"  (1.676 m)   SpO2 93%   BMI 27.76 kg/m   BP Readings from Last 3 Encounters:  04/22/23 126/62  02/11/23 136/70  01/10/23 136/60    Wt Readings from Last 3 Encounters:  01/10/23 172 lb (78 kg)  12/20/22 170 lb (77.1 kg)   12/12/22 175 lb (79.4 kg)    Physical Exam  Lab Results  Component Value Date   WBC 9.3 12/20/2022   HGB 14.3 12/20/2022   HCT 44.3 12/20/2022   PLT 204 12/20/2022   GLUCOSE 104 (H) 12/20/2022   CHOL 260 (H) 06/06/2022   TRIG 249.0 (H) 06/06/2022   HDL 39.30 06/06/2022   LDLDIRECT 175.0 06/06/2022   LDLCALC 101 (H) 03/06/2021   ALT 27 12/20/2022   AST 40 12/20/2022   NA 136 12/20/2022   K 4.7 12/20/2022   CL 101 12/20/2022   CREATININE 1.14 (H) 12/20/2022   BUN 16 12/20/2022   CO2 25 12/20/2022   TSH 0.96 10/02/2022   INR 1.1 12/08/2021   HGBA1C 6.2 10/02/2022    DG Chest 2 View  Result Date: 12/20/2022 CLINICAL DATA:  Hypertension EXAM: CHEST - 2 VIEW COMPARISON:  12/12/2021 FINDINGS: Transverse diameter of heart is increased. There are no signs of alveolar pulmonary edema. Increased markings are seen in left lower lung field. There is no focal consolidation. There is no pleural effusion or pneumothorax. IMPRESSION: Subtle increase in interstitial markings in left lower lung field may suggest crowding of markings due to poor inspiration or subsegmental atelectasis and possibly early interstitial pneumonia. Electronically Signed   By: Harlan Stains.D.  On: 12/20/2022 20:07   CT Head Wo Contrast  Result Date: 12/20/2022 CLINICAL DATA:  Headache EXAM: CT HEAD WITHOUT CONTRAST TECHNIQUE: Contiguous axial images were obtained from the base of the skull through the vertex without intravenous contrast. RADIATION DOSE REDUCTION: This exam was performed according to the departmental dose-optimization program which includes automated exposure control, adjustment of the mA and/or kV according to patient size and/or use of iterative reconstruction technique. COMPARISON:  MRI 11/25/2022 FINDINGS: Brain: Normal anatomic configuration. Parenchymal volume loss is commensurate with the patient's age. Stable mild periventricular white matter changes are present likely reflecting the  sequela of small vessel ischemia. Remote right cerebellar infarct noted. No abnormal intra or extra-axial mass lesion or fluid collection. No abnormal mass effect or midline shift. No evidence of acute intracranial hemorrhage or infarct. Ventricular size is normal. Cerebellum unremarkable. Vascular: No asymmetric hyperdense vasculature at the skull base. Skull: Intact Sinuses/Orbits: Paranasal sinuses are clear. Orbits are unremarkable. Other: Mastoid air cells and middle ear cavities are clear. IMPRESSION: 1. No acute intracranial hemorrhage or infarct. 2. Stable mild senescent change. 3. Remote right cerebellar infarct.  The Electronically Signed   By: Helyn Numbers M.D.   On: 12/20/2022 18:59    Assessment & Plan:   Diarrhea of presumed infectious origin -     Fecal lactoferrin, quant; Future -     Clostridium difficile EIA; Future -     Pancreatic elastase, fecal; Future -     CALPROTECTIN; Future -     Gliadin antibodies, serum; Future -     Tissue transglutaminase, IgA; Future -     Reticulin Antibody, IgA w titer; Future  Diarrhea due to malabsorption -     Fecal lactoferrin, quant; Future -     Clostridium difficile EIA; Future -     Pancreatic elastase, fecal; Future -     CALPROTECTIN; Future -     Gliadin antibodies, serum; Future -     Tissue transglutaminase, IgA; Future -     Reticulin Antibody, IgA w titer; Future  Essential hypertension -     Basic metabolic panel; Future -     CBC with Differential/Platelet; Future  Hypothyroidism, unspecified type -     TSH; Future     Follow-up: No follow-ups on file.  Sanda Linger, MD

## 2023-04-22 NOTE — Patient Instructions (Signed)
Chronic Diarrhea Chronic diarrhea is when a person passes frequent loose and sometimes watery stools for 4 weeks or longer. Non-chronic diarrhea usually lasts for only 2-3 days. Diarrhea can cause a person to feel weak and become dehydrated. Dehydration is a condition in which there is not enough water or other fluids in the body. Dehydration can make the person tired and thirsty. It can also cause a dry mouth, decreased urination, and dark yellow urine. Diarrhea is a sign of an underlying problem, such as: Infection. Side effects of medicines. Problems digesting something in your diet, such as milk products if you have lactose intolerance. Conditions such as celiac disease, irritable bowel syndrome (IBS), or inflammatory bowel disease (IBD). If you have chronic diarrhea, make sure you treat it as told by your health care provider. Follow these instructions at home: Medicines Take over-the-counter and prescription medicines only as told by your health care provider. If you were prescribed antibiotics, take them as told by your health care provider. Do not stop taking the antibiotic even if you start to feel better. Eating and drinking  Follow instructions from your health care provider about what to eat and drink. You may have to: Avoid foods that trigger diarrhea for you. Take an oral rehydration solution (ORS). This is a drink that keeps you hydrated. It can be found at pharmacies and retail stores. Drink clear fluids, such as water, diluted fruit juice, and low-calorie sports drinks. You can also get fluids by sucking on ice chips. Drink enough fluid to keep your urine pale yellow. This will help you avoid dehydration. Eat small amounts of bland foods that are easy to digest as you are able. These foods include bananas, applesauce, rice, lean meats, toast, and crackers. Avoid spicy or fatty foods. Avoid foods and drinks that contain a lot of sugar or caffeine. Do not drink alcohol if: Your  health care provider tells you not to drink. You are pregnant, may be pregnant, or are planning to become pregnant. If you drink alcohol: Limit how much you have to: 0-1 drink a day for women. 0-2 drinks a day for men. Know how much alcohol is in your drink. In the [place] one drink equals one 12 oz bottle of beer (355 mL), one 5 oz glass of wine (148 mL), or one 1 oz glass of hard liquor (44 mL). General instructions  Wash your hands often and after each diarrhea episode for at least 20 seconds. Use soap and water. If soap and water are not available, use hand sanitizer. Make sure that all people in your household wash their hands well and often. Rest as told by your health care provider. Take a warm bath to relieve any burning or pain from frequent diarrhea episodes. Watch your condition for any changes. Contact a health care provider if: You have a fever. Your diarrhea gets worse or does not get better. You have new symptoms. You vomit every time you eat or drink. You feel light-headed or dizzy. You have muscle cramps. You have severe pain in the rectum. You have signs of dehydration, such as: Dark urine, very little urine, or no urine. Cracked lips. Dry mouth. Sunken eyes. Sleepiness. Weakness. You have bloody or black stools, or stools that look like tar. You have severe pain, cramping, or bloating in your abdomen, or pain that stays in one place. Your skin feels cold and clammy. You feel confused. You have a severe headache. Get help right away if: You have chest pain   or your heart is beating very quickly. You have trouble breathing or you are breathing very quickly. You feel extremely weak or you faint. These symptoms may be an emergency. Get help right away. Call 911. Do not wait to see if the symptoms will go away. Do not drive yourself to the hospital. This information is not intended to replace advice given to you by your health care provider. Make sure you discuss  any questions you have with your health care provider. Document Revised: 05/29/2022 Document Reviewed: 05/29/2022 Elsevier Patient Education  2023 Elsevier Inc.  

## 2023-04-23 DIAGNOSIS — N184 Chronic kidney disease, stage 4 (severe): Secondary | ICD-10-CM | POA: Insufficient documentation

## 2023-04-23 LAB — CBC WITH DIFFERENTIAL/PLATELET
Basophils Absolute: 0 10*3/uL (ref 0.0–0.1)
Basophils Relative: 0.5 % (ref 0.0–3.0)
Eosinophils Absolute: 0.4 10*3/uL (ref 0.0–0.7)
Eosinophils Relative: 5.5 % — ABNORMAL HIGH (ref 0.0–5.0)
HCT: 43.8 % (ref 36.0–46.0)
Hemoglobin: 14.4 g/dL (ref 12.0–15.0)
Lymphocytes Relative: 27.3 % (ref 12.0–46.0)
Lymphs Abs: 2 10*3/uL (ref 0.7–4.0)
MCHC: 32.9 g/dL (ref 30.0–36.0)
MCV: 98.1 fl (ref 78.0–100.0)
Monocytes Absolute: 0.7 10*3/uL (ref 0.1–1.0)
Monocytes Relative: 9.2 % (ref 3.0–12.0)
Neutro Abs: 4.2 10*3/uL (ref 1.4–7.7)
Neutrophils Relative %: 57.5 % (ref 43.0–77.0)
Platelets: 274 10*3/uL (ref 150.0–400.0)
RBC: 4.47 Mil/uL (ref 3.87–5.11)
RDW: 15.9 % — ABNORMAL HIGH (ref 11.5–15.5)
WBC: 7.3 10*3/uL (ref 4.0–10.5)

## 2023-04-23 LAB — GLIADIN ANTIBODIES, SERUM
Gliadin IgA: <1 U/mL
Gliadin IgG: <1 U/mL

## 2023-04-23 LAB — BASIC METABOLIC PANEL
BUN: 44 mg/dL — ABNORMAL HIGH (ref 6–23)
CO2: 23 mEq/L (ref 19–32)
Calcium: 8.6 mg/dL (ref 8.4–10.5)
Chloride: 109 mEq/L (ref 96–112)
Creatinine, Ser: 1.84 mg/dL — ABNORMAL HIGH (ref 0.40–1.20)
GFR: 25.04 mL/min — ABNORMAL LOW (ref >60.00)
Glucose, Bld: 97 mg/dL (ref 70–99)
Potassium: 4.4 mEq/L (ref 3.5–5.1)
Sodium: 141 mEq/L (ref 135–145)

## 2023-04-23 LAB — TISSUE TRANSGLUTAMINASE, IGA: (tTG) Ab, IgA: <1 U/mL

## 2023-04-23 LAB — TSH: TSH: 0.66 u[IU]/mL (ref 0.35–5.50)

## 2023-04-23 MED ORDER — LEVOTHYROXINE SODIUM 88 MCG PO TABS
88.0000 ug | ORAL_TABLET | Freq: Every day | ORAL | 1 refills | Status: DC
Start: 2023-04-23 — End: 2024-01-07

## 2023-04-23 MED ORDER — ROSUVASTATIN CALCIUM 10 MG PO TABS
10.0000 mg | ORAL_TABLET | Freq: Every day | ORAL | 1 refills | Status: DC
Start: 2023-04-23 — End: 2024-01-05

## 2023-04-24 ENCOUNTER — Encounter: Payer: Self-pay | Admitting: Internal Medicine

## 2023-04-24 LAB — RETICULIN ANTIBODIES, IGA W TITER: Reticulin Ab, IgA: NEGATIVE titer (ref <2.5)

## 2023-04-25 DIAGNOSIS — R197 Diarrhea, unspecified: Secondary | ICD-10-CM | POA: Diagnosis not present

## 2023-04-25 DIAGNOSIS — K909 Intestinal malabsorption, unspecified: Secondary | ICD-10-CM | POA: Diagnosis not present

## 2023-04-26 ENCOUNTER — Telehealth: Payer: Self-pay | Admitting: Internal Medicine

## 2023-04-26 NOTE — Telephone Encounter (Incomplete)
Prescription Request  04/26/2023  LOV: 04/22/2023  What is the name of the medication or equipment? ***  Have you contacted your pharmacy to request a refill? {yes/no:20286}   Which pharmacy would you like this sent to?  [id number] - [place], [place] - [address] AT [address] OF [facility] [address] [place] [address] Phone: [phone] Fax: [phone]  [facility] - [place], [place] - [address] [place] [address] Phone: [phone] Fax: [phone]    Patient notified that their request is being sent to the clinical staff for review and that they should receive a response within 2 business days.   Please advise at {HOME/MOBILE:[phone]}

## 2023-04-27 LAB — CLOSTRIDIUM DIFFICILE EIA: C difficile Toxins A+B, EIA: NEGATIVE

## 2023-04-29 ENCOUNTER — Other Ambulatory Visit: Payer: Self-pay | Admitting: Internal Medicine

## 2023-04-29 ENCOUNTER — Telehealth: Payer: Self-pay | Admitting: Internal Medicine

## 2023-04-29 DIAGNOSIS — M170 Bilateral primary osteoarthritis of knee: Secondary | ICD-10-CM | POA: Insufficient documentation

## 2023-04-29 NOTE — Telephone Encounter (Signed)
Patient saw Dr. Yetta Barre 04/22/23 for issues with her leg. She said she can barely walk and it's painful. She would like to know if Dr. Yetta Barre will send her a referral for ortho. Best callback is [phone].

## 2023-05-01 ENCOUNTER — Other Ambulatory Visit (INDEPENDENT_AMBULATORY_CARE_PROVIDER_SITE_OTHER): Payer: Medicare HMO

## 2023-05-01 ENCOUNTER — Ambulatory Visit (INDEPENDENT_AMBULATORY_CARE_PROVIDER_SITE_OTHER): Payer: Medicare HMO | Admitting: Orthopaedic Surgery

## 2023-05-01 ENCOUNTER — Encounter: Payer: Self-pay | Admitting: Orthopaedic Surgery

## 2023-05-01 ENCOUNTER — Other Ambulatory Visit: Payer: Self-pay

## 2023-05-01 DIAGNOSIS — M79604 Pain in right leg: Secondary | ICD-10-CM

## 2023-05-01 DIAGNOSIS — M5441 Lumbago with sciatica, right side: Secondary | ICD-10-CM | POA: Diagnosis not present

## 2023-05-01 DIAGNOSIS — G8929 Other chronic pain: Secondary | ICD-10-CM | POA: Diagnosis not present

## 2023-05-01 MED ORDER — METHYLPREDNISOLONE 4 MG PO TABS: ORAL_TABLET | ORAL | 0 refills | Status: DC

## 2023-05-01 NOTE — Progress Notes (Signed)
The patient is an active 82 year old female who does ambulate with a cane.  She has been having significant right sided low back pain and sciatica for several months now and has been getting worse.  She does a lot of walking and does still work and goes to peoples houses.  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  She denies any groin pain and denies any hip pain on either side.  She is not a diabetic but she cannot take anti-inflammatories due to chronic kidney disease.  She does take Tylenol arthritis.  She has never had back surgery before either.  Examination both her hips show the move smoothly and fluidly.  She does have a positive slight leg raise on the right side but not the left.  She has good strength in her lower extremities except for some slight weakness on hip flexion on the right when comparing the right and left.  There is no pain over the trochanteric area of the IT band on the right side.  All her pain seems to be in the lower aspects of lumbar spine to the right side and radiates into the sciatic region and down the back of her leg.  She says her biggest complaints are her her losing her mobility in general.  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  I would like to send her for an MRI of her lumbar spine given her right-sided radicular symptoms.  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  It was more mild to the right side.  At this point given her symptoms we will need to determine the next options in terms of considering some type of epidural injection or other intervention in her back other than surgery.  Of note she has also had physical therapy.  In the interim I will place put her on a 6-day steroid taper and then  see her back once we have this MRI.  She agrees with this treatment plan.

## 2023-05-02 LAB — FECAL LACTOFERRIN, QUANT: LACTOFERRIN, QL, STOOL: NEGATIVE

## 2023-05-02 LAB — PANCREATIC ELASTASE, FECAL: Pancreatic Elastase-1, Stool: >500 mcg/g

## 2023-05-02 LAB — CALPROTECTIN: Calprotectin: 176 mcg/g — ABNORMAL HIGH

## 2023-05-05 ENCOUNTER — Other Ambulatory Visit: Payer: Self-pay | Admitting: Internal Medicine

## 2023-05-05 DIAGNOSIS — K909 Intestinal malabsorption, unspecified: Secondary | ICD-10-CM

## 2023-05-05 DIAGNOSIS — R195 Other fecal abnormalities: Secondary | ICD-10-CM | POA: Insufficient documentation

## 2023-05-06 ENCOUNTER — Ambulatory Visit (INDEPENDENT_AMBULATORY_CARE_PROVIDER_SITE_OTHER): Payer: Medicare HMO | Admitting: Psychology

## 2023-05-06 DIAGNOSIS — F3289 Other specified depressive episodes: Secondary | ICD-10-CM | POA: Diagnosis not present

## 2023-05-06 NOTE — Progress Notes (Signed)
[facility] Counselor/Therapist Progress Note  Patient ID: Alexis Shelton, MRN: [id number]    Date: 05/06/23  Time Spent: 9:06  am - 9:32 am : 26 Minutes  Treatment Type: Individual Therapy.  Reported Symptoms: anxiety and depression.   Mental Status Exam: Appearance:  Neat and Well Groomed     Behavior: Appropriate  Motor: Normal  Speech/Language:  Clear and Coherent  Affect: Congruent  Mood: anxious and dysthymic  Thought process: normal  Thought content:   WNL  Sensory/Perceptual disturbances:   WNL  Orientation: oriented to person, place, time/date, and situation  Attention: Good  Concentration: Good  Memory: WNL  Fund of knowledge:  Good  Insight:   Good  Judgment:  Good  Impulse Control: Good   Risk Assessment: Danger to Self:  No Self-injurious Behavior: No Danger to Others: No Duty to Warn:no Physical Aggression / Violence:No  Access to Firearms a concern: No  Gang Involvement:No   Subjective:   [name] participated in the session, in person in the office with the therapist, and consented to treatment Aime reviewed the events of the past week. Kyrin noted disturbed sleep and noted waking often feeling distressed about aging and the possible loss of autonomy. She noted often waking and feeling as though she will be visiting her mother that day and noting that "after a few seconds" the recognition that her mother passed many years ago. She noted continuing to miss her son and grandson. She noted her ex-husband recently reached out via a letter after many years of no contact. She noted him stating that he was concerned about their son. We ended the session early due to [facility] reporting gastrointestinal stress. She will contact the office to follow-up. Therapist provided supportive therapy.   Interventions: CBT and interpersonal   Diagnosis:   Other depression  Psychiatric Treatment: No   Treatment Plan:  Client  Abilities/Strengths Shanikwa is verbose and motivated for change.   Support System: Friend: [name]  Client Treatment Preferences Outpatient therapy.  Client Statement of Needs [name] would like to process relationships with son and sister, managing her overall symptoms, manage overall stressors, including financials, and process past events.  Additionally, processing aging, feeling lonely, and financial stressors.   Treatment Level Weekly  Symptoms  Depression: loss of interest, feeling down, difficulty sleeping (middle insomnia) ,lethargy, fluctuating appetite (poor appetite), , trouble concentration, psychomotor retardation, and denied history of SI   (Status: maintained) Anxiety: Feeling nervous, difficulty managing worry, worrying about different things, trouble relaxing, difficulty sitting still, easily irritable, feeling afraid something awful might happen.    (Status: maintained)  Goals:   Andreal experiences symptoms of depression and anxiety.    Target Date: 12/05/23 Frequency: Weekly  Progress: 0 Modality: individual    Therapist will provide referrals for additional resources as appropriate.  Therapist will provide psycho-education regarding Cyrene's diagnosis and corresponding treatment approaches and interventions. Licensed Clinical Social Worker, [place], LCSW will support the patient's ability to achieve the goals identified. will employ CBT, BA, Problem-solving, Solution Focused, Mindfulness,  coping skills, & other evidenced-based practices will be used to promote progress towards healthy functioning to help manage decrease symptoms associated with her diagnosis.   Reduce overall level, frequency, and intensity of the feelings of depression and anxiety evidenced by decreased overall symptoms from 6 to 7 days/week to 0 to 1 days/week per client report for at least 3 consecutive months. Verbally express understanding of the relationship between feelings of depression,  anxiety and  their impact on thinking patterns and behaviors. Verbalize an understanding of the role that distorted thinking plays in creating fears, excessive worry, and ruminations.  [name] participated in the creation of the treatment plan)    Delight Ovens, LCSW

## 2023-05-07 ENCOUNTER — Encounter (INDEPENDENT_AMBULATORY_CARE_PROVIDER_SITE_OTHER): Payer: Self-pay

## 2023-05-07 ENCOUNTER — Encounter: Payer: Self-pay | Admitting: Internal Medicine

## 2023-05-08 ENCOUNTER — Other Ambulatory Visit: Payer: Self-pay | Admitting: Internal Medicine

## 2023-05-08 DIAGNOSIS — R400 Somnolence: Secondary | ICD-10-CM | POA: Insufficient documentation

## 2023-05-08 MED ORDER — MODAFINIL 100 MG PO TABS
100.0000 mg | ORAL_TABLET | Freq: Every day | ORAL | 2 refills | Status: DC
Start: 2023-05-08 — End: 2023-09-10

## 2023-05-11 ENCOUNTER — Ambulatory Visit
Admission: RE | Admit: 2023-05-11 | Discharge: 2023-05-11 | Disposition: A | Discharge status: Home / Self Care | Payer: Medicare HMO | Source: Ambulatory Visit | Attending: Orthopaedic Surgery | Admitting: Orthopaedic Surgery

## 2023-05-11 DIAGNOSIS — M79604 Pain in right leg: Secondary | ICD-10-CM

## 2023-05-11 DIAGNOSIS — M48061 Spinal stenosis, lumbar region without neurogenic claudication: Secondary | ICD-10-CM | POA: Diagnosis not present

## 2023-05-11 DIAGNOSIS — M9963 Osseous and subluxation stenosis of intervertebral foramina of lumbar region: Secondary | ICD-10-CM | POA: Diagnosis not present

## 2023-05-11 DIAGNOSIS — M5431 Sciatica, right side: Secondary | ICD-10-CM | POA: Diagnosis not present

## 2023-05-11 DIAGNOSIS — G8929 Other chronic pain: Secondary | ICD-10-CM

## 2023-05-16 ENCOUNTER — Telehealth: Payer: Self-pay | Admitting: Internal Medicine

## 2023-05-16 NOTE — Telephone Encounter (Signed)
Patient called to schedule with Dr. Leone Payor. Advised he does not have any available appointments. She only wants to see a doctor and not a APP. Requesting a call back to discuss Diarrhea due to malabsorption and Elevated fecal cal protectin that she is experiencing. Please advise, thank you.

## 2023-05-21 NOTE — Telephone Encounter (Signed)
Left message for pt to call back  °

## 2023-05-22 ENCOUNTER — Telehealth: Payer: Self-pay | Admitting: Orthopaedic Surgery

## 2023-05-22 NOTE — Telephone Encounter (Signed)
Called patient left message to return call to schedule an appointment for MRI review with Dr Blackman  

## 2023-05-22 NOTE — Telephone Encounter (Signed)
Left message for pt to call back  °

## 2023-05-23 ENCOUNTER — Ambulatory Visit: Payer: Medicare HMO | Admitting: Orthopaedic Surgery

## 2023-05-23 NOTE — Progress Notes (Unsigned)
Office Visit Note  Patient: Alexis Shelton             Date of Birth: [birth year]           MRN: [id number]             PCP: Etta Grandchild, MD Referring: Etta Grandchild, MD Visit Date: 06/04/2023 Occupation: @GUAROCC @  Subjective:  Medication monitoring   History of Present Illness: [name] is a 82 y.o. female with history of gout, osteoarthritis, and DDD.  She remains on allopurinol 100 mg 2 tablets by mouth daily.  She is tolerating allopurinol without any side effects.  Patient reports that she has had intermittent symptoms of and has taken colchicine very sparingly.  Patient reports that she had a colchicine prescription sent to the pharmacy about 5 years ago which she has had on hand.  She requested a refill of colchicine to keep on hand for future flares to take as needed.  She continues to take tramadol for pain relief and tizanidine as needed for muscle spasms.  She has been using a cane to assist with ambulation.  She has had some increased discomfort in both knee replacements as well as having increased discomfort in her lower back.  She has noticed increased difficulty with her mobility recently.  She recently had an MRI of her spine and will be following up with her spine specialist to discuss the next steps in treatment soon.   Activities of Daily Living:  Patient reports morning stiffness for all day. Patient Reports nocturnal pain.  Difficulty dressing/grooming: Denies Difficulty climbing stairs: Denies Difficulty getting out of chair: Denies Difficulty using hands for taps, buttons, cutlery, and/or writing: Reports  Review of Systems  Constitutional:  Positive for fatigue.  HENT:  Negative for mouth sores and mouth dryness.   Eyes:  Negative for dryness.  Respiratory:  Positive for shortness of breath.   Cardiovascular:  Negative for chest pain and palpitations.  Gastrointestinal:  Negative for blood in stool, constipation and diarrhea.  Endocrine:  Negative for increased urination.  Genitourinary:  Negative for involuntary urination.  Musculoskeletal:  Positive for joint pain, gait problem, joint pain, myalgias, morning stiffness, muscle tenderness and myalgias. Negative for joint swelling and muscle weakness.  Skin:  Negative for color change, rash, hair loss and sensitivity to sunlight.  Allergic/Immunologic: Negative for susceptible to infections.  Neurological:  Positive for headaches. Negative for dizziness.  Hematological:  Negative for swollen glands.  Psychiatric/Behavioral:  Positive for depressed mood. Negative for sleep disturbance. The patient is not nervous/anxious.     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   Obesity 08/19/2020   Proteinuria 08/19/2020   Vitamin D deficiency 02/11/2019   Stage 3 chronic kidney disease (HCC) 02/11/2019   Mild intermittent asthma without complication 02/11/2019   Primary osteoarthritis of both hands 11/24/2018   Primary osteoarthritis of both feet 11/24/2018   DDD (degenerative disc disease), cervical 11/24/2018   DDD (degenerative disc disease),  lumbar 11/24/2018   Gait abnormality 10/13/2018   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   Asthma 08/12/2018   Hypothyroidism 08/12/2018   Essential hypertension 07/04/2018   Seasonal allergic rhinitis 05/28/2017   Gouty arthropathy 09/15/2010   Osteoarthrosis,  generalized, involving multiple sites 09/15/2010    Past Medical History:  Diagnosis Date   Arthritis    DDD (degenerative disc disease), lumbar 11/24/2018   Dizziness    Dyspnea    Elevated liver function tests    Gallstones    Hearing loss    History of stroke involving cerebellum 10/13/2018   Hypercholesteremia    Hypertension    Hypothyroidism    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   Migraines    Mild intermittent asthma without complication 02/11/2019   Primary osteoarthritis of both feet 11/24/2018   Rheumatoid arthritis (HCC) 07/04/2018   Stage 3 chronic kidney disease (HCC) 02/11/2019   Vitamin D deficiency 02/11/2019    Family History  Problem Relation Age of Onset   Stroke Mother    Hyperlipidemia Mother    Arthritis Mother    Heart disease Father    Arthritis Sister    Heart disease Sister    Heart disease Paternal Uncle        x 7   Gallbladder disease Maternal Grandmother        radiation   GER disease Maternal Grandmother    Bipolar disorder Son    Healthy Son    Colon cancer Neg Hx    Esophageal cancer Neg Hx    Rectal cancer Neg Hx    Stomach cancer Neg Hx    Cancer Neg Hx    Past Surgical History:  Procedure Laterality Date   ABDOMINAL HYSTERECTOMY     BREAST REDUCTION SURGERY Bilateral    CATARACT EXTRACTION, BILATERAL     CHOLECYSTECTOMY N/A 12/11/2021   Procedure: LAPAROSCOPIC CHOLECYSTECTOMY;  Surgeon: Almond Lint, MD;  Location: [facility];  Service: General;  Laterality: N/A;   ENDOSCOPIC RETROGRADE CHOLANGIOPANCREATOGRAPHY (ERCP) WITH PROPOFOL N/A 12/09/2021   Procedure: ENDOSCOPIC RETROGRADE CHOLANGIOPANCREATOGRAPHY (ERCP) WITH PROPOFOL;  Surgeon: Rachael Fee, MD;  Location: [facility];  Service: Endoscopy;  Laterality: N/A;   INCISION AND DRAINAGE BREAST ABSCESS Left    REDUCTION MAMMAPLASTY     REMOVAL OF STONES  12/09/2021   Procedure: REMOVAL OF STONES;  Surgeon: Rachael Fee, MD;  Location: [facility];  Service:  Endoscopy;;   REPLACEMENT TOTAL KNEE Bilateral    SPHINCTEROTOMY  12/09/2021   Procedure: SPHINCTEROTOMY;  Surgeon: Rachael Fee, MD;  Location: [facility];  Service: Endoscopy;;   TONSILLECTOMY AND ADENOIDECTOMY     TOOTH EXTRACTION  2023   x2   Social History   Social History Narrative   Lives alone.  The patient is widowed.   She worked at the [employer] in [place], MD scientist, and then moved to [place] and moved to [place] where her son is a International aid/development worker, after her husband died   Right-handed.   1 cup coffee per day.   No alcohol   1 son as above   Former smoker   Immunization History  Administered Date(s) Administered   Fluad Quad(high Dose 65+) 10/09/2021, 10/02/2022   Influenza, High Dose Seasonal PF 09/20/2018, 09/11/2019, 12/08/2019   Influenza-Unspecified 11/20/2020   Moderna Sars-Covid-2 Vaccination 01/08/2020, 02/04/2020   PFIZER(Purple Top)SARS-COV-2 Vaccination 11/20/2020   PNEUMOCOCCAL CONJUGATE-20 02/21/2022   Td 10/09/2021     Objective: Vital Signs: BP (!) 180/71 (BP Location: Left  Arm, Patient Position: Sitting, Cuff Size: Normal)   Pulse 61   Resp 18   Ht 5\' 6"  (1.676 m)   Wt 172 lb 6.4 oz (78.2 kg)   BMI 27.83 kg/m    Physical Exam Vitals and nursing note reviewed.  Constitutional:      Appearance: She is well-developed.  HENT:     Head: Normocephalic and atraumatic.  Eyes:     Conjunctiva/sclera: Conjunctivae normal.  Cardiovascular:     Rate and Rhythm: Normal rate and regular rhythm.     Heart sounds: Normal heart sounds.  Pulmonary:     Effort: Pulmonary effort is normal.     Breath sounds: Normal breath sounds.  Abdominal:     General: Bowel sounds are normal.     Palpations: Abdomen is soft.  Musculoskeletal:     Cervical back: Normal range of motion.  Lymphadenopathy:     Cervical: No cervical adenopathy.  Skin:    General: Skin is warm and dry.     Capillary Refill: Capillary refill takes less than 2 seconds.   Neurological:     Mental Status: She is alert and oriented to person, place, and time.  Psychiatric:        Behavior: Behavior normal.      Musculoskeletal Exam: C-spine has limited range of motion.  Postural thoracic kyphosis.  Limited mobility of the lumbar spine.  Shoulder joints and elbow joints have good range of motion.  Severe CMC, PIP, DIP thickening consistent with osteoarthritis of both hands.  Incomplete extension of DIP joints.  Subluxation of several DIP joints.  Hip joints have good range of motion with no groin pain.  Bilateral knee replacements have good range of motion with no warmth or effusion.  Ankle joints have good range of motion with no joint tenderness.  CDAI Exam: CDAI Score: -- Patient Global: --; Provider Global: -- Swollen: --; Tender: -- Joint Exam 06/04/2023   No joint exam has been documented for this visit   There is currently no information documented on the homunculus. Go to the Rheumatology activity and complete the homunculus joint exam.  Investigation: No additional findings.  Imaging: MR Lumbar Spine w/o contrast  Result Date: 05/19/2023 CLINICAL DATA:  Lumbar spinal stenosis.  Right sciatica EXAM: MRI LUMBAR SPINE WITHOUT CONTRAST TECHNIQUE: Multiplanar, multisequence MR imaging of the lumbar spine was performed. No intravenous contrast was administered. COMPARISON:  10/30/2018 FINDINGS: Segmentation:  Standard. Alignment:  Physiologic. Vertebrae: No fracture, evidence of discitis, or bone lesion. Chronic wedging of the T12 vertebral body, stable. Conus medullaris and cauda equina: Conus extends to the L1 level. Conus and cauda equina appear normal. Paraspinal and other soft tissues: Negative for perispinal mass or inflammation. Atrophy of intrinsic back muscles. Disc levels: T12- L1: Disc collapse and endplate/facet spurring. No neural impingement. L1-L2: Disc collapse with endplate and facet spurring. The canal and foramina are patent L2-L3: Disc  narrowing and bulging with endplate and facet spurring. The canal and foramina are patent L3-L4: Disc narrowing and bulging. Moderate degenerative facet spurring on both sides. Epidural fat causes thecal sac narrowing without nerve root compression. L4-L5: Disc collapse and endplate degeneration with endplate and facet spurring eccentric to the right. Epidural fat causes mild thecal sac narrowing in addition to degenerative change. Right foraminal impingement L5-S1:Disc collapse with endplate and facet spurring on both sides. Left more than right foraminal impingement and L5 root deformity. Patent spinal canal IMPRESSION: 1. Advanced and generalized lumbar spine degeneration with no significant  change since 2019. 2. Foraminal impingement on the right at L4-5 and bilaterally at L5-S1. 3. Diffusely patent spinal canal. Electronically Signed   By: Tiburcio Pea M.D.   On: 05/19/2023 05:26    Recent Labs: Lab Results  Component Value Date   WBC 7.3 04/22/2023   HGB 14.4 04/22/2023   PLT 274.0 04/22/2023   NA 141 04/22/2023   K 4.4 04/22/2023   CL 109 04/22/2023   CO2 23 04/22/2023   GLUCOSE 97 04/22/2023   BUN 44 (H) 04/22/2023   CREATININE 1.84 (H) 04/22/2023   BILITOT 0.7 12/20/2022   ALKPHOS 54 12/20/2022   AST 40 12/20/2022   ALT 27 12/20/2022   PROT 7.0 12/20/2022   ALBUMIN 3.7 12/20/2022   CALCIUM 8.6 04/22/2023   GFRAA 41 (L) 12/08/2019    Speciality Comments: No specialty comments available.  Procedures:  No procedures performed Allergies: Patient has no known allergies.   Assessment / Plan:     Visit Diagnoses: Idiopathic chronic gout of multiple sites without tophus - She has been taking allopurinol 100 mg 2 tablets by mouth daily.  She is tolerating allopurinol without any side effects.  She recently had symptoms of a gout flare at which time she took colchicine 1 tablet daily as needed which resolved her symptoms.  According to the patient the prescription of colchicine was  sent to the pharmacy about 5 years ago but she has kept it on hand to take as needed during flares.  A new prescription for colchicine 0.6 mg 1 tablet daily as needed during gout flares was sent to the pharmacy today.  Plan on updating uric acid level.  Discussed that ideally her uric acid level should remain less than 6.  Her uric acid was 7.8 on 12/04/2022.  We will call her with the updated uric acid level result tomorrow.  For now she will remain on the current dose of allopurinol.  She was advised notify us if she starts to have recurrent flares of gout.  She will follow-up in the office in 6 months or sooner if needed. - Plan: Uric acid  Medication monitoring encounter -Plan to update uric acid today.  Plan: Uric acid  Chronic renal impairment, stage 3b (HCC): Followed by urologist.  She is not currently under the care of a nephrologist. Creatinine was 1.84 and GFR was 25.04 on 04/02/2023.  Primary osteoarthritis of both hands: She has CMC, PIP, DIP thickening consistent with osteoarthritis of both hands.  Incomplete extension of DIP joints with subluxation of several DIP joints.  No synovitis was noted on examination today.  Status post total bilateral knee replacement: She is been experiencing some increased discomfort in both knee replacements.  She has been using a cane to assist with ambulation.  Primary osteoarthritis of both feet: Good range of motion of both ankle joints with no tenderness or synovitis.  DDD (degenerative disc disease), cervical: C-spine has limited range of motion especially with lateral rotation.  DDD (degenerative disc disease), lumbar: Chronic pain.  She has noticed decreased mobility despite using a cane to assist with ambulation.  She is followed by a spine specialist and had a recent MRI for further evaluation.  Other medical conditions are listed as follows:  Dyslipidemia  Essential hypertension: Blood pressure was elevated today in the office.  Blood  pressure was rechecked prior to leaving.  She will continue to monitor blood pressure closely and will reach out to her PCP if it remains elevated.  History  of stroke involving cerebellum  Vitamin D deficiency  History of hypothyroidism  Orders: Orders Placed This Encounter  Procedures   Uric acid   Meds ordered this encounter  Medications   colchicine 0.6 MG tablet    Sig: Take 1 tablet (0.6 mg total) by mouth daily as needed.    Dispense:  90 tablet    Refill:  0      Follow-Up Instructions: Return in about 6 months (around 12/04/2023) for Gout, Osteoarthritis, DDD.   Gearldine Bienenstock, PA-C  Note - This record has been created using Dragon software.  Chart creation errors have been sought, but may not always  have been located. Such creation errors do not reflect on  the standard of medical care.

## 2023-05-23 NOTE — Telephone Encounter (Signed)
No response from patient as of this time. Will await return call for further recommendations.

## 2023-06-04 ENCOUNTER — Ambulatory Visit: Payer: Medicare HMO | Attending: Physician Assistant | Admitting: Physician Assistant

## 2023-06-04 ENCOUNTER — Encounter: Payer: Self-pay | Admitting: Physician Assistant

## 2023-06-04 VITALS — BP 180/71 | HR 61 | Resp 18 | Ht 66.0 in | Wt 172.4 lb

## 2023-06-04 DIAGNOSIS — I10 Essential (primary) hypertension: Secondary | ICD-10-CM

## 2023-06-04 DIAGNOSIS — M19041 Primary osteoarthritis, right hand: Secondary | ICD-10-CM | POA: Diagnosis not present

## 2023-06-04 DIAGNOSIS — Z5181 Encounter for therapeutic drug level monitoring: Secondary | ICD-10-CM | POA: Diagnosis not present

## 2023-06-04 DIAGNOSIS — M1A09X0 Idiopathic chronic gout, multiple sites, without tophus (tophi): Secondary | ICD-10-CM

## 2023-06-04 DIAGNOSIS — Z8673 Personal history of transient ischemic attack (TIA), and cerebral infarction without residual deficits: Secondary | ICD-10-CM

## 2023-06-04 DIAGNOSIS — Z96653 Presence of artificial knee joint, bilateral: Secondary | ICD-10-CM

## 2023-06-04 DIAGNOSIS — M5030 Other cervical disc degeneration, unspecified cervical region: Secondary | ICD-10-CM | POA: Diagnosis not present

## 2023-06-04 DIAGNOSIS — E785 Hyperlipidemia, unspecified: Secondary | ICD-10-CM

## 2023-06-04 DIAGNOSIS — M19042 Primary osteoarthritis, left hand: Secondary | ICD-10-CM

## 2023-06-04 DIAGNOSIS — M5136 Other intervertebral disc degeneration, lumbar region: Secondary | ICD-10-CM | POA: Diagnosis not present

## 2023-06-04 DIAGNOSIS — N1832 Chronic kidney disease, stage 3b: Secondary | ICD-10-CM | POA: Diagnosis not present

## 2023-06-04 DIAGNOSIS — Z8639 Personal history of other endocrine, nutritional and metabolic disease: Secondary | ICD-10-CM

## 2023-06-04 DIAGNOSIS — M19071 Primary osteoarthritis, right ankle and foot: Secondary | ICD-10-CM | POA: Diagnosis not present

## 2023-06-04 DIAGNOSIS — M19072 Primary osteoarthritis, left ankle and foot: Secondary | ICD-10-CM

## 2023-06-04 DIAGNOSIS — E559 Vitamin D deficiency, unspecified: Secondary | ICD-10-CM

## 2023-06-04 MED ORDER — COLCHICINE 0.6 MG PO TABS: 0.6000 mg | ORAL_TABLET | Freq: Every day | ORAL | 0 refills | Status: DC | PRN

## 2023-06-04 NOTE — Patient Instructions (Addendum)
Information for patients with Gout  Gout defined-Gout occurs when urate crystals accumulate in your joint causing the inflammation and intense pain of gout attack.  Urate crystals can form when you have high levels of uric acid in your blood.  Your body produces uric acid when it breaks down prurines-substances that are found naturally in your body, as well as in certain foods such as organ meats, anchioves, herring, asparagus, and mushrooms.  Normally uric acid dissolves in your blood and passes through your kidneys into your urine.  But sometimes your body either produces too much uric acid or your kidneys excrete too little uric acid.  When this happens, uric acid can build up, forming sharp needle-like urate crystals in a joint or surrounding tissue that cause pain, inflammation and swelling.    Gout is characterized by sudden, severe attacks of pain, redness and tenderness in joints, often the joint at the base of the big toe.  Gout is complex form of arthritis that can affect anyone.  Men are more likely to get gout but women become increasingly more susceptible to gout after menopause.  An acute attack of gout can wake you up in the middle of the night with the sensation that your big toe is on fire.  The affected joint is hot, swollen and so tender that even the weight or the sheet on it may seem intolerable.  If you experience symptoms of an acute gout attack it is important to your doctor as soon as the symptoms start.  Gout that goes untreated can lead to worsening pain and joint damage.  Risk Factors:  You are more likely to develop gout if you have high levels of uric acid in your body.    Factors that increase the uric acid level in your body include:  Lifestyle factors.  Excessive alcohol use-generally more than two drinks a day for men and more than one for women increase the risk of gout.  Medical conditions.  Certain conditions make it more likely that you will develop gout.   These include hypertension, and chronic conditions such as diabetes, high levels of fat and cholesterol in the blood, and narrowing of the arteries.  Certain medications.  The uses of Thiazide diuretics- commonly used to treat hypertension and low dose aspirin can also increase uric acid levels.  Family history of gout.  If other members of your family have had gout, you are more likely to develop the disease.  Age and sex. Gout occurs more often in men than it does in women, primarily because women tend to have lower uric acid levels than men do.  Men are more likely to develop gout earlier usually between the ages of 40-50- whereas women generally develop signs and symptoms after menopause.    Tests and diagnosis:  Tests to help diagnose gout may include:  Blood test.  Your doctor may recommend a blood test to measure the uric acid level in your blood .  Blood tests can be misleading, though.  Some people have high uric acid levels but never experience gout.  And some people have signs and symptoms of gout, but don't have unusual levels of uric acid in their blood.  Joint fluid test.  Your doctor may use a needle to draw fluid from your affected joint.  When examined under the microscope, your joint fluid may reveal urate crystals.  Treatment:  Treatment for gout usually involves medications.  What medications you and your doctor choose will be   based on your current health and other medications you currently take.  Gout medications can be used to treat acute gout attacks and prevent future attacks as well as reduce your risk of complications from gout such as the development of tophi from urate crystal deposits.  Alternative medicine:   Certain foods have been studied for their potential to lower uric acid levels, including:  Coffee.  Studies have found an association between coffee drinking (regular and decaf) and lower uric acid levels.  The evidence is not enough to encourage  non-coffee drinkers to start, but it may give clues to new ways of treating gout in the future.  Vitamin C.  Supplements containing vitamin C may reduce the levels of uric acid in your blood.  However, vitamin as a treatment for gout. Don't assume that if a little vitamin C is good, than lots is better.  Megadoses of vitamin C may increase your bodies uric acid levels.  Cherries.  Cherries have been associated with lower levels of uric acid in studies, but it isn't clear if they have any effect on gout signs and symptoms.  Eating more cherries and other dar-colored fruits, such as blackberries, blueberries, purple grapes and raspberries, may be a safe way to support your gout treatment.    Lifestyle/Diet Recommendations:  Drink 8 to 16 cups ( about 2 to 4 liters) of fluid each day, with at least half being water. Avoid alcohol Eat a moderate amount of protein, preferably from healthy sources, such as low-fat or fat-free dairy, tofu, eggs, and nut butters. Limit you daily intake of meat, fish, and poultry to 4 to 6 ounces. Avoid high fat meats and desserts. Decrease you intake of shellfish, beef, lamb, pork, eggs and cheese. Choose a good source of vitamin C daily such as citrus fruits, strawberries, broccoli,  brussel sprouts, papaya, and cantaloupe.  Choose a good source of vitamin A every other day such as yellow fruits, or dark green/yellow vegetables. Avoid drastic weigh reduction or fasting.  If weigh loss is desired lose it over a period of several months. See "dietary considerations.." chart for specific food recommendations.  Dietary Considerations for people with Gout  Food with negligible purine content (0-15 mg of purine nitrogen per 100 grams food)  May use as desired except on calorie variations  Non fat milk Cocoa Cereals (except in list II) Hard candies  Buttermilk Carbonated drinks Vegetables (except in list II) Sherbet  Coffee Fruits Sugar Honey  Tea Cottage Cheese  Gelatin-jell-o Salt  Fruit juice Breads Angel food Cake   Herbs/spices Jams/Jellies [employer]    Foods that do not contain excessive purine content, but must be limited due to fat content  Cream Eggs Oil and Salad Dressing  Half and Half Peanut Butter Chocolate  Whole Milk Cakes Potato Chips  Butter Ice Cream Fried Foods  Cheese Nuts Waffles, pancakes   List II: Food with moderate purine content (50-150 mg of purine nitrogen per 100 grams of food)  Limit total amount each day to 5 oz. cooked Lean meat, other than those on list III   Poultry, other than those on list III Fish, other than those on list III   Seafood, other than those on list III  These foods may be used occasionally  Peas Lentils Bran  Spinach Oatmeal Dried Beans and Peas  Asparagus Wheat Germ Mushrooms   Additional information about meat choices  Choose fish and poultry, particularly without skin, often.  Select lean, well  trimmed cuts of meat.  Avoid all fatty meats, bacon , sausage, fried meats, fried fish, or poultry, luncheon meats, cold cuts, hot dogs, meats canned or frozen in gravy, spareribs and frozen and packaged prepared meats.   List III: Foods with HIGH purine content / Foods to AVOID (150-800 mg of purine nitrogen per 100 grams of food)  Anchovies Herring Meat Broths  Liver Mackerel Meat Extracts  Kidney Scallops Meat Drippings  Sardines Wild Game Mincemeat  Sweetbreads Goose Gravy  Heart Tongue Yeast, baker's and brewers   Commercial soups made with any of the foods listed in List II or List III  In addition avoid all alcoholic beverages   Hand Exercises Hand exercises can be helpful for almost anyone. They can strengthen your hands and improve flexibility and movement. The exercises can also increase blood flow to the hands. These results can make your work and daily tasks easier for you. Hand exercises can be especially helpful for people who have joint pain from  arthritis or nerve damage from using their hands over and over. These exercises can also help people who injure a hand. Exercises Most of these hand exercises are gentle stretching and motion exercises. It is usually safe to do them often throughout the day. Warming up your hands before exercise may help reduce stiffness. You can do this with gentle massage or by placing your hands in warm water for 10-15 minutes. It is normal to feel some stretching, pulling, tightness, or mild discomfort when you begin new exercises. In time, this will improve. Remember to always be careful and stop right away if you feel sudden, very bad pain or your pain gets worse. You want to get better and be safe. Ask your health care provider which exercises are safe for you. Do exercises exactly as told by your provider and adjust them as told. Do not begin these exercises until told by your provider. Knuckle bend or "claw" fist  Stand or sit with your arm, hand, and all five fingers pointed straight up. Make sure to keep your wrist straight. Gently bend your fingers down toward your palm until the tips of your fingers are touching your palm. Keep your big knuckle straight and only bend the small knuckles in your fingers. Hold this position for 10 seconds. Straighten your fingers back to your starting position. Repeat this exercise 5-10 times with each hand. Full finger fist  Stand or sit with your arm, hand, and all five fingers pointed straight up. Make sure to keep your wrist straight. Gently bend your fingers into your palm until the tips of your fingers are touching the middle of your palm. Hold this position for 10 seconds. Extend your fingers back to your starting position, stretching every joint fully. Repeat this exercise 5-10 times with each hand. Straight fist  Stand or sit with your arm, hand, and all five fingers pointed straight up. Make sure to keep your wrist straight. Gently bend your fingers at the  big knuckle, where your fingers meet your hand, and at the middle knuckle. Keep the knuckle at the tips of your fingers straight and try to touch the bottom of your palm. Hold this position for 10 seconds. Extend your fingers back to your starting position, stretching every joint fully. Repeat this exercise 5-10 times with each hand. Tabletop  Stand or sit with your arm, hand, and all five fingers pointed straight up. Make sure to keep your wrist straight. Gently bend your fingers at the  big knuckle, where your fingers meet your hand, as far down as you can. Keep the small knuckles in your fingers straight. Think of forming a tabletop with your fingers. Hold this position for 10 seconds. Extend your fingers back to your starting position, stretching every joint fully. Repeat this exercise 5-10 times with each hand. Finger spread  Place your hand flat on a table with your palm facing down. Make sure your wrist stays straight. Spread your fingers and thumb apart from each other as far as you can until you feel a gentle stretch. Hold this position for 10 seconds. Bring your fingers and thumb tight together again. Hold this position for 10 seconds. Repeat this exercise 5-10 times with each hand. Making circles  Stand or sit with your arm, hand, and all five fingers pointed straight up. Make sure to keep your wrist straight. Make a circle by touching the tip of your thumb to the tip of your index finger. Hold for 10 seconds. Then open your hand wide. Repeat this motion with your thumb and each of your fingers. Repeat this exercise 5-10 times with each hand. Thumb motion  Sit with your forearm resting on a table and your wrist straight. Your thumb should be facing up toward the ceiling. Keep your fingers relaxed as you move your thumb. Lift your thumb up as high as you can toward the ceiling. Hold for 10 seconds. Bend your thumb across your palm as far as you can, reaching the tip of your thumb  for the small finger (pinkie) side of your palm. Hold for 10 seconds. Repeat this exercise 5-10 times with each hand. Grip strengthening  Hold a stress ball or other soft ball in the middle of your hand. Slowly increase the pressure, squeezing the ball as much as you can without causing pain. Think of bringing the tips of your fingers into the middle of your palm. All of your finger joints should bend when doing this exercise. Hold your squeeze for 10 seconds, then relax. Repeat this exercise 5-10 times with each hand. Contact a health care provider if: Your hand pain or discomfort gets much worse when you do an exercise. Your hand pain or discomfort does not improve within 2 hours after you exercise. If you have either of these problems, stop doing these exercises right away. Do not do them again unless your provider says that you can. Get help right away if: You develop sudden, severe hand pain or swelling. If this happens, stop doing these exercises right away. Do not do them again unless your provider says that you can. This information is not intended to replace advice given to you by your health care provider. Make sure you discuss any questions you have with your health care provider. Document Revised: 12/25/2022 Document Reviewed: 12/25/2022 Elsevier Patient Education  2024 [employer].

## 2023-06-05 LAB — URIC ACID: Uric Acid, Serum: 5.8 mg/dL (ref 2.5–7.0)

## 2023-06-05 NOTE — Progress Notes (Signed)
Uric acid WNL

## 2023-06-10 ENCOUNTER — Ambulatory Visit (INDEPENDENT_AMBULATORY_CARE_PROVIDER_SITE_OTHER): Payer: Medicare HMO | Admitting: Psychology

## 2023-06-10 DIAGNOSIS — F3289 Other specified depressive episodes: Secondary | ICD-10-CM

## 2023-06-10 NOTE — Progress Notes (Signed)
[facility] Counselor/Therapist Progress Note  Patient ID: Alexis Shelton, MRN: [id number]    Date: 06/10/23  Time Spent: 3:31  pm - 4:29 pm : 58 Minutes  Treatment Type: Individual Therapy.  Reported Symptoms: anxiety and depression.   Mental Status Exam: Appearance:  Neat and Well Groomed     Behavior: Appropriate  Motor: Normal  Speech/Language:  Clear and Coherent  Affect: Congruent  Mood: anxious and dysthymic  Thought process: normal  Thought content:   WNL  Sensory/Perceptual disturbances:   WNL  Orientation: oriented to person, place, time/date, and situation  Attention: Good  Concentration: Good  Memory: WNL  Fund of knowledge:  Good  Insight:   Good  Judgment:  Good  Impulse Control: Good   Risk Assessment: Danger to Self:  No Self-injurious Behavior: No Danger to Others: No Duty to Warn:no Physical Aggression / Violence:No  Access to Firearms a concern: No  Gang Involvement:No   Subjective:   Alexis Shelton participated in the session, in person in the office with the therapist, and consented to treatment Alexis Shelton reviewed the events of the past week. She noted worry about her grandson, Alexis Shelton, due to the custody situation and her son and Alexis Shelton being court involved regarding custody. She noted a lack of contact by her son despite her interest and attempts to have a positive relationship. She noted missing her son and grandson and noted her attempts being ineffectual. She noted worry about her ability to ambulate and noted anxiety regarding this and noted a worry that she will be wheelchair bound at some point. She noted this primarily due to issues with her balance.  She noted her sister "abandoned me" and discussed that her sister recently reached out. We worked on exploring this during the session. She noted her part-time job being over for the season. We discussed the effect of this on her mood in relation to rumination about her son.  We discussed identifying areas of control and lack of control and working on engaging in challenging and enjoyable hobbies to shift focus. Alexis Shelton expressed agreement regarding this goal and noted her intent to work towards this, going forward. Therapist praised Alexis Shelton for her effort and energy during the session. We scheduled a follow-up for continued treatment. Therapist provided supportive therapy.   Interventions: CBT and interpersonal   Diagnosis:   Other depression  Psychiatric Treatment: No   Treatment Plan:  Client Abilities/Strengths Alexis Shelton is verbose and motivated for change.   Support System: Friend: Alexis Shelton  Client Treatment Preferences Outpatient therapy.  Client Statement of Needs Alexis Shelton would like to process relationships with son and sister, managing her overall symptoms, manage overall stressors, including financials, and process past events.  Additionally, processing aging, feeling lonely, and financial stressors.   Treatment Level Weekly  Symptoms  Depression: loss of interest, feeling down, difficulty sleeping (middle insomnia) ,lethargy, fluctuating appetite (poor appetite), , trouble concentration, psychomotor retardation, and denied history of SI   (Status: maintained) Anxiety: Feeling nervous, difficulty managing worry, worrying about different things, trouble relaxing, difficulty sitting still, easily irritable, feeling afraid something awful might happen.    (Status: maintained)  Goals:   Alexis Shelton experiences symptoms of depression and anxiety.    Target Date: 12/05/23 Frequency: Weekly  Progress: 0 Modality: individual    Therapist will provide referrals for additional resources as appropriate.  Therapist will provide psycho-education regarding Alexis Shelton's diagnosis and corresponding treatment approaches and interventions. Licensed Clinical Social Worker, [place], Alexis Shelton will support the  patient's ability to achieve the goals identified. will employ CBT, BA,  Problem-solving, Solution Focused, Mindfulness,  coping skills, & other evidenced-based practices will be used to promote progress towards healthy functioning to help manage decrease symptoms associated with her diagnosis.   Reduce overall level, frequency, and intensity of the feelings of depression and anxiety evidenced by decreased overall symptoms from 6 to 7 days/week to 0 to 1 days/week per client report for at least 3 consecutive months. Verbally express understanding of the relationship between feelings of depression, anxiety and their impact on thinking patterns and behaviors. Verbalize an understanding of the role that distorted thinking plays in creating fears, excessive worry, and ruminations.  [name] participated in the creation of the treatment plan)    Alexis Ovens, Alexis Shelton

## 2023-06-19 ENCOUNTER — Ambulatory Visit (INDEPENDENT_AMBULATORY_CARE_PROVIDER_SITE_OTHER): Payer: Medicare HMO

## 2023-06-19 VITALS — Ht 66.0 in | Wt 172.0 lb

## 2023-06-19 DIAGNOSIS — Z0000 Encounter for general adult medical examination without abnormal findings: Secondary | ICD-10-CM | POA: Diagnosis not present

## 2023-06-19 NOTE — Progress Notes (Signed)
Subjective:   Alexis Shelton is a 82 y.o. female who presents for Medicare Annual (Subsequent) preventive examination.  Visit Complete: Virtual  I connected with  [name] on 06/19/23 by a audio enabled telemedicine application and verified that I am speaking with the correct person using two identifiers.  Patient Location: Home  Provider Location: Office/Clinic  I discussed the limitations of evaluation and management by telemedicine. The patient expressed understanding and agreed to proceed.  Review of Systems     Cardiac Risk Factors include: advanced age (>85men, >75 women);dyslipidemia;hypertension;family history of premature cardiovascular disease;sedentary lifestyle     Objective:    Today's Vitals   06/19/23 1446  Weight: 172 lb (78 kg)  Height: 5\' 6"  (1.676 m)  PainSc: 7   PainLoc: Back   Body mass index is 27.76 kg/m.     06/19/2023    2:50 PM 12/20/2022    7:25 PM 12/20/2022    7:03 PM 06/18/2022    3:37 PM 12/08/2021    1:30 PM 03/14/2021   11:46 AM 06/24/2020    1:21 PM  Advanced Directives  Does Patient Have a Medical Advance Directive? Yes No Yes No Yes Yes Yes  Type of Estate agent of [place];Living will  Healthcare Power of [employer] Power of Attorney Living will Living will  Does patient want to make changes to medical advance directive?     Yes (Inpatient - patient requests chaplain consult to change a medical advance directive)  No - Patient declined  Copy of Healthcare Power of Attorney in Chart? No - copy requested  No - copy available, Physician notified  No - copy requested No - copy requested No - copy requested    Current Medications (verified) Outpatient Encounter Medications as of 06/19/2023  Medication Sig   allopurinol (ZYLOPRIM) 100 MG tablet Take 1 tablet (100 mg total) by mouth 2 (two) times daily.   ASPIRIN LOW DOSE 81 MG tablet TAKE 1 TABLET (81 MG TOTAL) BY MOUTH DAILY. SWALLOW  WHOLE.   budesonide-formoterol (SYMBICORT) 160-4.5 MCG/ACT inhaler Inhale 2 puffs into the lungs daily as needed (sob/wheezing).   colchicine 0.6 MG tablet Take 1 tablet (0.6 mg total) by mouth daily as needed.   levothyroxine (SYNTHROID) 88 MCG tablet Take 1 tablet (88 mcg total) by mouth daily before breakfast.   methylPREDNISolone (MEDROL) 4 MG tablet Medrol dose pack. Take as instructed (Patient not taking: Reported on 06/04/2023)   metoprolol succinate (TOPROL-XL) 25 MG 24 hr tablet TAKE 1 TABLET (25 MG TOTAL) BY MOUTH DAILY.   modafinil (PROVIGIL) 100 MG tablet Take 1 tablet (100 mg total) by mouth daily.   montelukast (SINGULAIR) 10 MG tablet TAKE 1 TABLET BY MOUTH EVERYDAY AT BEDTIME   Multiple Vitamin (MULTIVITAMIN) tablet Take 1 tablet by mouth daily.   rosuvastatin (CRESTOR) 10 MG tablet Take 1 tablet (10 mg total) by mouth daily.   tiZANidine (ZANAFLEX) 4 MG tablet TAKE 1 TABLET BY MOUTH AT BEDTIME AS NEEDED FOR MUSCLE SPASMS.   traMADol (ULTRAM) 50 MG tablet Take 1 tablet (50 mg total) by mouth every 6 (six) hours as needed. (Patient taking differently: Take 50 mg by mouth at bedtime.)   No facility-administered encounter medications on file as of 06/19/2023.    Allergies (verified) Patient has no known allergies.   History: Past Medical History:  Diagnosis Date   Arthritis    DDD (degenerative disc disease), lumbar 11/24/2018   Dizziness    Dyspnea  Elevated liver function tests    Gallstones    Hearing loss    History of stroke involving cerebellum 10/13/2018   Hypercholesteremia    Hypertension    Hypothyroidism    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   Migraines    Mild intermittent asthma without complication 02/11/2019   Primary osteoarthritis of both feet 11/24/2018   Rheumatoid arthritis (HCC) 07/04/2018   Stage 3 chronic kidney disease (HCC) 02/11/2019   Vitamin D deficiency 02/11/2019   Past Surgical History:  Procedure Laterality Date    ABDOMINAL HYSTERECTOMY     BREAST REDUCTION SURGERY Bilateral    CATARACT EXTRACTION, BILATERAL     CHOLECYSTECTOMY N/A 12/11/2021   Procedure: LAPAROSCOPIC CHOLECYSTECTOMY;  Surgeon: Almond Lint, MD;  Location: [facility];  Service: General;  Laterality: N/A;   ENDOSCOPIC RETROGRADE CHOLANGIOPANCREATOGRAPHY (ERCP) WITH PROPOFOL N/A 12/09/2021   Procedure: ENDOSCOPIC RETROGRADE CHOLANGIOPANCREATOGRAPHY (ERCP) WITH PROPOFOL;  Surgeon: Rachael Fee, MD;  Location: [facility];  Service: Endoscopy;  Laterality: N/A;   INCISION AND DRAINAGE BREAST ABSCESS Left    REDUCTION MAMMAPLASTY     REMOVAL OF STONES  12/09/2021   Procedure: REMOVAL OF STONES;  Surgeon: Rachael Fee, MD;  Location: [facility];  Service: Endoscopy;;   REPLACEMENT TOTAL KNEE Bilateral    SPHINCTEROTOMY  12/09/2021   Procedure: SPHINCTEROTOMY;  Surgeon: Rachael Fee, MD;  Location: [facility];  Service: Endoscopy;;   TONSILLECTOMY AND ADENOIDECTOMY     TOOTH EXTRACTION  2023   x2   Family History  Problem Relation Age of Onset   Stroke Mother    Hyperlipidemia Mother    Arthritis Mother    Heart disease Father    Arthritis Sister    Heart disease Sister    Heart disease Paternal Uncle        x 7   Gallbladder disease Maternal Grandmother        radiation   GER disease Maternal Grandmother    Bipolar disorder Son    Healthy Son    Colon cancer Neg Hx    Esophageal cancer Neg Hx    Rectal cancer Neg Hx    Stomach cancer Neg Hx    Cancer Neg Hx    Social History   Socioeconomic History   Marital status: Divorced    Spouse name: Not on file   Number of children: 1   Years of education: MD   Highest education level: Professional school degree (e.g., MD, DDS, DVM, JD)  Occupational History   Occupation: Retired - Sales promotion account executive    Comment: [employer]  Tobacco Use   Smoking status: Former    Packs/day: 0.10    Years: 3.00    Additional pack years: 0.00    Total pack years: 0.30    Types:  Cigarettes    Quit date: 1970    Years since quitting: 54.5    Passive exposure: Never   Smokeless tobacco: Never  Vaping Use   Vaping Use: Never used  Substance and Sexual Activity   Alcohol use: Not Currently   Drug use: Never   Sexual activity: Not Currently  Other Topics Concern   Not on file  Social History Narrative   Lives alone.  The patient is widowed.   She worked at the [employer] in [place], MD scientist, and then moved to [place] and moved to [place] where her son is a International aid/development worker, after her husband died   Right-handed.   1 cup coffee per day.  No alcohol   1 son as above   Former smoker   Social Determinants of Corporate investment banker Strain: Medium Risk (06/19/2023)   Overall Financial Resource Strain (CARDIA)    Difficulty of Paying Living Expenses: Somewhat hard  Food Insecurity: No Food Insecurity (06/19/2023)   Hunger Vital Sign    Worried About Running Out of Food in the Last Year: Never true    Ran Out of Food in the Last Year: Never true  Recent Concern: Food Insecurity - Food Insecurity Present (04/21/2023)   Hunger Vital Sign    Worried About Running Out of Food in the Last Year: Sometimes true    Ran Out of Food in the Last Year: Sometimes true  Transportation Needs: No Transportation Needs (06/19/2023)   PRAPARE - Administrator, Civil Service (Medical): No    Lack of Transportation (Non-Medical): No  Physical Activity: Inactive (06/19/2023)   Exercise Vital Sign    Days of Exercise per Week: 0 days    Minutes of Exercise per Session: 0 min  Stress: Stress Concern Present (06/19/2023)   [employer] of Occupational Health - Occupational Stress Questionnaire    Feeling of Stress : To some extent  Social Connections: Moderately Isolated (06/19/2023)   Social Connection and Isolation Panel [NHANES]    Frequency of Communication with Friends and Family: More than three times a week    Frequency of Social Gatherings with Friends and  Family: Once a week    Attends Religious Services: 1 to 4 times per year    Active Member of [employer] or Organizations: No    Attends Engineer, structural: Never    Marital Status: Divorced    Tobacco Counseling Counseling given: Not Answered   Clinical Intake:  Pre-visit preparation completed: Yes  Pain : 0-10 Pain Score: 7  Pain Type: Chronic pain Pain Location: Back Pain Orientation: Lower     BMI - recorded: 27.76 Nutritional Status: BMI 25 -29 Overweight Nutritional Risks: None Diabetes: No  How often do you need to have someone help you when you read instructions, pamphlets, or other written materials from your doctor or pharmacy?: 1 - Never What is the last grade level you completed in school?: POST GRADUATE DEGREE Scientist, MD)  Interpreter Needed?: No  Information entered by :: Susie Cassette, LPN.   Activities of Daily Living    06/19/2023    2:55 PM  In your present state of health, do you have any difficulty performing the following activities:  Hearing? 1  Vision? 0  Difficulty concentrating or making decisions? 0  Comment BSE: reading, puzzles, studying, write poetry  Walking or climbing stairs? 1  Dressing or bathing? 0  Doing errands, shopping? 0  Preparing Food and eating ? N  Using the Toilet? N  In the past six months, have you accidently leaked urine? Y  Do you have problems with loss of bowel control? Y  Managing your Medications? N  Managing your Finances? N  Housekeeping or managing your Housekeeping? N    Patient Care Team: Etta Grandchild, MD as PCP - General (Internal Medicine) Iva Boop, MD as Consulting Physician (Gastroenterology) Soundra Pilon, LCSW as Social Worker (Licensed Clinical Social Worker) Patton Salles, MD as Consulting Physician (Obstetrics and Gynecology)  Indicate any recent Medical Services you may have received from other than [facility] providers in the past year (date may be  approximate).     Assessment:  This is a routine wellness examination for Monique.  Hearing/Vision screen Hearing Screening - Comments:: Patient has issues with hearing; no hearing aids.  Vision Screening - Comments:: No eyeglasses; not up to date with routine eye exams. Patient will schedule.   Dietary issues and exercise activities discussed:     Goals Addressed   None   Depression Screen    06/19/2023    2:58 PM 06/18/2022    3:34 PM 08/09/2021    4:45 PM  PHQ 2/9 Scores  PHQ - 2 Score 0 2 2  PHQ- 9 Score 3  9    Fall Risk    06/19/2023    2:51 PM 02/11/2023   10:54 AM 06/18/2022    3:34 PM 06/06/2022    1:53 PM 03/21/2022    3:15 PM  Fall Risk   Falls in the past year? 1 0 1 0 0  Number falls in past yr: 1 0 0  0  Injury with Fall? 0 0 0  0  Risk for fall due to : History of fall(s);Impaired balance/gait;Orthopedic patient No Fall Risks Impaired balance/gait  No Fall Risks  Follow up  Falls evaluation completed Falls evaluation completed      MEDICARE RISK AT HOME:  Medicare Risk at Home - 06/19/23 1450     Any stairs in or around the home? No    If so, are there any without handrails? No    Home free of loose throw rugs in walkways, pet beds, electrical cords, etc? Yes    Adequate lighting in your home to reduce risk of falls? Yes    Life alert? Yes    Use of a cane, walker or w/c? Yes    Grab bars in the bathroom? No   HAVE THEM BUT NEED TO INSTALL   Shower chair or bench in shower? No    Elevated toilet seat or a handicapped toilet? No             TIMED UP AND GO:  Was the test performed?  No    Cognitive Function:        06/19/2023    2:51 PM 06/18/2022    3:36 PM  6CIT Screen  What Year? 0 points 0 points  What month? 0 points 0 points  What time? 0 points 0 points  Count back from 20 0 points 0 points  Months in reverse 0 points 0 points  Repeat phrase 0 points 2 points  Total Score 0 points 2 points    Immunizations Immunization  History  Administered Date(s) Administered   Fluad Quad(high Dose 65+) 10/09/2021, 10/02/2022   Influenza, High Dose Seasonal PF 09/20/2018, 09/11/2019, 12/08/2019   Influenza-Unspecified 11/20/2020   Moderna Sars-Covid-2 Vaccination 01/08/2020, 02/04/2020   PFIZER(Purple Top)SARS-COV-2 Vaccination 11/20/2020   PNEUMOCOCCAL CONJUGATE-20 02/21/2022   Td 10/09/2021    TDAP status: Up to date  Flu Vaccine status: Up to date  Pneumococcal vaccine status: Up to date  Covid-19 vaccine status: Completed vaccines  Qualifies for Shingles Vaccine? Yes   Zostavax completed No   Shingrix Completed?: No.    Education has been provided regarding the importance of this vaccine. Patient has been advised to call insurance company to determine out of pocket expense if they have not yet received this vaccine. Advised may also receive vaccine at local pharmacy or Health Dept. Verbalized acceptance and understanding.  Screening Tests Health Maintenance  Topic Date Due   Zoster Vaccines- Shingrix (1 of 2) Never done  DEXA SCAN  Never done   INFLUENZA VACCINE  07/25/2023   Medicare Annual Wellness (AWV)  06/18/2024   DTaP/Tdap/Td (2 - Tdap) 10/10/2031   Pneumonia Vaccine 51+ Years old  Completed   HPV VACCINES  Aged Out   COVID-19 Vaccine  Discontinued    Health Maintenance  Health Maintenance Due  Topic Date Due   Zoster Vaccines- Shingrix (1 of 2) Never done   DEXA SCAN  Never done    Colorectal cancer screening: No longer required.   Mammogram status: Completed 09/06/2022. Repeat every year  Bone Density status: Never done or no record.  Lung Cancer Screening: (Low Dose CT Chest recommended if Age 17-80 years, 20 pack-year currently smoking OR have quit w/in 15years.) does not qualify.   Lung Cancer Screening Referral: no  Additional Screening:  Hepatitis C Screening: does not qualify; Completed: no  Vision Screening: Recommended annual ophthalmology exams for early detection of  glaucoma and other disorders of the eye. Is the patient up to date with their annual eye exam?  Yes  Who is the provider or what is the name of the office in which the patient attends annual eye exams? Patient could not remember name If pt is not established with a provider, would they like to be referred to a provider to establish care? No .   Dental Screening: Recommended annual dental exams for proper oral hygiene  Diabetic Foot Exam: N/A  Community Resource Referral / Chronic Care Management: CRR required this visit?  No   CCM required this visit?  No     Plan:     I have personally reviewed and noted the following in the patient's chart:   Medical and social history Use of alcohol, tobacco or illicit drugs  Current medications and supplements including opioid prescriptions. Patient is not currently taking opioid prescriptions. Functional ability and status Nutritional status Physical activity Advanced directives List of other physicians Hospitalizations, surgeries, and ER visits in previous 12 months Vitals Screenings to include cognitive, depression, and falls Referrals and appointments  In addition, I have reviewed and discussed with patient certain preventive protocols, quality metrics, and best practice recommendations. A written personalized care plan for preventive services as well as general preventive health recommendations were provided to patient.     Mickeal Needy, LPN   1/61/0960   After Visit Summary: (Mail) Due to this being a telephonic visit, the after visit summary with patients personalized plan was offered to patient via mail   Nurse Notes: Normal cognitive status assessed by direct observation via telephone conversation by this Nurse Health Advisor. No abnormalities found.

## 2023-06-19 NOTE — Patient Instructions (Addendum)
[name] , Thank you for taking time to come for your Medicare Wellness Visit. I appreciate your ongoing commitment to your health goals. Please review the following plan we discussed and let me know if I can assist you in the future.   These are the goals we discussed:  Goals      Connect with community resources     Activities and task to complete in order to accomplish goals.   Call your insurance company to follow up on concerns with your care insurance Follow up on food resources discussed (see list below as well as a list was e-mailed to you per your request) Keep all upcoming appointment discussed today Continue with compliance of taking medication prescribed by Doctor I have placed a referral with [facility] someone will call you from the following agencies   [facility] for food options [employer] [place] [facility] about employment  [employer] for rental assistance    Food Resources  [employer], [address] (in the building behind the main building) In Person Shopping: Wednesday 12pm-7pm, Friday 11am-2pm, and Saturday 9am-12pm  [employer]  [address] [place] [address] [phone]   Mon- Wed 10:00  to 12:00  [address] [place] [employer] [place] [address] [place], [place], [address] [phone]  Call to schedule a time to pick up food        This is a list of the screening recommended for you and due dates:  Health Maintenance  Topic Date Due   Zoster (Shingles) Vaccine (1 of 2) Never done   DEXA scan (bone density measurement)  Never done   Flu Shot  07/25/2023   Medicare Annual Wellness Visit  06/18/2024   DTaP/Tdap/Td vaccine (2 - Tdap) 10/10/2031   Pneumonia Vaccine  Completed   HPV Vaccine  Aged Out   COVID-19 Vaccine  Discontinued    Advanced directives: Yes  Conditions/risks identified: Yes  Next appointment: It was nice speaking with you today!   Please follow up in one year for your annual wellness visit via telephone call with Nurse Percell Miller on 06/24/2024 at 4:00 p.m.  If you need to cancel or reschedule please call [phone].   Preventive Care 84 Years and Older, Female Preventive care refers to lifestyle choices and visits with your health care provider that can promote health and wellness. What does preventive care include? A yearly physical exam. This is also called an annual well check. Dental exams once or twice a year. Routine eye exams. Ask your health care provider how often you should have your eyes checked. Personal lifestyle choices, including: Daily care of your teeth and gums. Regular physical activity. Eating a healthy diet. Avoiding tobacco and drug use. Limiting alcohol use. Practicing safe sex. Taking low-dose aspirin every day. Taking vitamin and mineral supplements as recommended by your health care provider. What happens during an annual well check? The services and screenings done by your health care provider during your annual well check will depend on your age, overall health, lifestyle risk factors, and family history of disease. Counseling  Your health care provider may ask you questions about your: Alcohol use. Tobacco use. Drug use. Emotional well-being. Home and relationship well-being. Sexual activity. Eating habits. History of falls. Memory and ability to understand (cognition). Work and work Astron[employer]. Reproductive health. Screening  You may have the following tests or measurements: Height, weight, and BMI. Blood pressure. Lipid and cholesterol levels. These  may be checked every 5 years, or more frequently if you are over 3 years old. Skin check. Lung cancer screening. You may have this screening every year starting at age 9 if you have a 30-pack-year history of smoking and currently smoke or have quit within the past 15 years. Fecal occult blood test (FOBT) of the stool. You  may have this test every year starting at age 4. Flexible sigmoidoscopy or colonoscopy. You may have a sigmoidoscopy every 5 years or a colonoscopy every 10 years starting at age 28. Hepatitis C blood test. Hepatitis B blood test. Sexually transmitted disease (STD) testing. Diabetes screening. This is done by checking your blood sugar (glucose) after you have not eaten for a while (fasting). You may have this done every 1-3 years. Bone density scan. This is done to screen for osteoporosis. You may have this done starting at age 20. Mammogram. This may be done every 1-2 years. Talk to your health care provider about how often you should have regular mammograms. Talk with your health care provider about your test results, treatment options, and if necessary, the need for more tests. Vaccines  Your health care provider may recommend certain vaccines, such as: Influenza vaccine. This is recommended every year. Tetanus, diphtheria, and acellular pertussis (Tdap, Td) vaccine. You may need a Td booster every 10 years. Zoster vaccine. You may need this after age 25. Pneumococcal 13-valent conjugate (PCV13) vaccine. One dose is recommended after age 25. Pneumococcal polysaccharide (PPSV23) vaccine. One dose is recommended after age 77. Talk to your health care provider about which screenings and vaccines you need and how often you need them. This information is not intended to replace advice given to you by your health care provider. Make sure you discuss any questions you have with your health care provider. Document Released: 01/06/2016 Document Revised: 08/29/2016 Document Reviewed: 10/11/2015 Elsevier Interactive Patient Education  2017 [place] Prevention in the Home Falls can cause injuries. They can happen to people of all ages. There are many things you can do to make your home safe and to help prevent falls. What can I do on the outside of my home? Regularly fix the edges of  walkways and driveways and fix any cracks. Remove anything that might make you trip as you walk through a door, such as a raised step or threshold. Trim any bushes or trees on the path to your home. Use bright outdoor lighting. Clear any walking paths of anything that might make someone trip, such as rocks or tools. Regularly check to see if handrails are loose or broken. Make sure that both sides of any steps have handrails. Any raised decks and porches should have guardrails on the edges. Have any leaves, snow, or ice cleared regularly. Use sand or salt on walking paths during winter. Clean up any spills in your garage right away. This includes oil or grease spills. What can I do in the bathroom? Use night lights. Install grab bars by the toilet and in the tub and shower. Do not use towel bars as grab bars. Use non-skid mats or decals in the tub or shower. If you need to sit down in the shower, use a plastic, non-slip stool. Keep the floor dry. Clean up any water that spills on the floor as soon as it happens. Remove soap buildup in the tub or shower regularly. Attach bath mats securely with double-sided non-slip rug tape. Do not have throw rugs and other things on the  floor that can make you trip. What can I do in the bedroom? Use night lights. Make sure that you have a light by your bed that is easy to reach. Do not use any sheets or blankets that are too big for your bed. They should not hang down onto the floor. Have a firm chair that has side arms. You can use this for support while you get dressed. Do not have throw rugs and other things on the floor that can make you trip. What can I do in the kitchen? Clean up any spills right away. Avoid walking on wet floors. Keep items that you use a lot in easy-to-reach places. If you need to reach something above you, use a strong step stool that has a grab bar. Keep electrical cords out of the way. Do not use floor polish or wax that  makes floors slippery. If you must use wax, use non-skid floor wax. Do not have throw rugs and other things on the floor that can make you trip. What can I do with my stairs? Do not leave any items on the stairs. Make sure that there are handrails on both sides of the stairs and use them. Fix handrails that are broken or loose. Make sure that handrails are as long as the stairways. Check any carpeting to make sure that it is firmly attached to the stairs. Fix any carpet that is loose or worn. Avoid having throw rugs at the top or bottom of the stairs. If you do have throw rugs, attach them to the floor with carpet tape. Make sure that you have a light switch at the top of the stairs and the bottom of the stairs. If you do not have them, ask someone to add them for you. What else can I do to help prevent falls? Wear shoes that: Do not have high heels. Have rubber bottoms. Are comfortable and fit you well. Are closed at the toe. Do not wear sandals. If you use a stepladder: Make sure that it is fully opened. Do not climb a closed stepladder. Make sure that both sides of the stepladder are locked into place. Ask someone to hold it for you, if possible. Clearly mark and make sure that you can see: Any grab bars or handrails. First and last steps. Where the edge of each step is. Use tools that help you move around (mobility aids) if they are needed. These include: Canes. Walkers. Scooters. Crutches. Turn on the lights when you go into a dark area. Replace any light bulbs as soon as they burn out. Set up your furniture so you have a clear path. Avoid moving your furniture around. If any of your floors are uneven, fix them. If there are any pets around you, be aware of where they are. Review your medicines with your doctor. Some medicines can make you feel dizzy. This can increase your chance of falling. Ask your doctor what other things that you can do to help prevent falls. This  information is not intended to replace advice given to you by your health care provider. Make sure you discuss any questions you have with your health care provider. Document Released: 10/06/2009 Document Revised: 05/17/2016 Document Reviewed: 01/14/2015 Elsevier Interactive Patient Education  2017 [employer].

## 2023-06-20 ENCOUNTER — Ambulatory Visit (INDEPENDENT_AMBULATORY_CARE_PROVIDER_SITE_OTHER): Payer: Medicare HMO | Admitting: Radiology

## 2023-06-20 DIAGNOSIS — Z23 Encounter for immunization: Secondary | ICD-10-CM | POA: Diagnosis not present

## 2023-06-20 NOTE — Progress Notes (Signed)
Pt here for 2nd shingle shot. Pt tolerated it well with no concerns.

## 2023-06-27 ENCOUNTER — Emergency Department (HOSPITAL_COMMUNITY)
Admission: EM | Admit: 2023-06-27 | Discharge: 2023-06-27 | Disposition: A | Discharge status: Home / Self Care | Payer: Medicare HMO | Attending: Emergency Medicine | Admitting: Emergency Medicine

## 2023-06-27 ENCOUNTER — Emergency Department (HOSPITAL_COMMUNITY): Payer: Medicare HMO

## 2023-06-27 DIAGNOSIS — R61 Generalized hyperhidrosis: Secondary | ICD-10-CM | POA: Insufficient documentation

## 2023-06-27 DIAGNOSIS — R112 Nausea with vomiting, unspecified: Secondary | ICD-10-CM | POA: Diagnosis not present

## 2023-06-27 DIAGNOSIS — N3000 Acute cystitis without hematuria: Secondary | ICD-10-CM | POA: Insufficient documentation

## 2023-06-27 DIAGNOSIS — R1012 Left upper quadrant pain: Secondary | ICD-10-CM | POA: Diagnosis not present

## 2023-06-27 DIAGNOSIS — K429 Umbilical hernia without obstruction or gangrene: Secondary | ICD-10-CM | POA: Diagnosis not present

## 2023-06-27 DIAGNOSIS — K449 Diaphragmatic hernia without obstruction or gangrene: Secondary | ICD-10-CM | POA: Diagnosis not present

## 2023-06-27 DIAGNOSIS — Z79899 Other long term (current) drug therapy: Secondary | ICD-10-CM | POA: Diagnosis not present

## 2023-06-27 DIAGNOSIS — R0902 Hypoxemia: Secondary | ICD-10-CM | POA: Diagnosis not present

## 2023-06-27 DIAGNOSIS — Z7982 Long term (current) use of aspirin: Secondary | ICD-10-CM | POA: Diagnosis not present

## 2023-06-27 DIAGNOSIS — R110 Nausea: Secondary | ICD-10-CM | POA: Insufficient documentation

## 2023-06-27 DIAGNOSIS — M62838 Other muscle spasm: Secondary | ICD-10-CM | POA: Diagnosis not present

## 2023-06-27 DIAGNOSIS — R944 Abnormal results of kidney function studies: Secondary | ICD-10-CM | POA: Diagnosis not present

## 2023-06-27 DIAGNOSIS — I10 Essential (primary) hypertension: Secondary | ICD-10-CM | POA: Diagnosis not present

## 2023-06-27 DIAGNOSIS — K5730 Diverticulosis of large intestine without perforation or abscess without bleeding: Secondary | ICD-10-CM | POA: Diagnosis not present

## 2023-06-27 LAB — I-STAT CHEM 8, ED
BUN: 27 mg/dL — ABNORMAL HIGH (ref 8–23)
Calcium, Ion: 1.12 mmol/L — ABNORMAL LOW (ref 1.15–1.40)
Chloride: 108 mmol/L (ref 98–111)
Creatinine, Ser: 1.3 mg/dL — ABNORMAL HIGH (ref 0.44–1.00)
Glucose, Bld: 99 mg/dL (ref 70–99)
HCT: 41 % (ref 36.0–46.0)
Hemoglobin: 13.9 g/dL (ref 12.0–15.0)
Potassium: 4.8 mmol/L (ref 3.5–5.1)
Sodium: 139 mmol/L (ref 135–145)
TCO2: 23 mmol/L (ref 22–32)

## 2023-06-27 LAB — CBC WITH DIFFERENTIAL/PLATELET
Abs Immature Granulocytes: 0.04 10*3/uL (ref 0.00–0.07)
Basophils Absolute: 0 10*3/uL (ref 0.0–0.1)
Basophils Relative: 1 %
Eosinophils Absolute: 0.3 10*3/uL (ref 0.0–0.5)
Eosinophils Relative: 4 %
HCT: 41.7 % (ref 36.0–46.0)
Hemoglobin: 13.2 g/dL (ref 12.0–15.0)
Immature Granulocytes: 1 %
Lymphocytes Relative: 24 %
Lymphs Abs: 1.5 10*3/uL (ref 0.7–4.0)
MCH: 31.7 pg (ref 26.0–34.0)
MCHC: 31.7 g/dL (ref 30.0–36.0)
MCV: 100 fL (ref 80.0–100.0)
Monocytes Absolute: 0.6 10*3/uL (ref 0.1–1.0)
Monocytes Relative: 10 %
Neutro Abs: 3.9 10*3/uL (ref 1.7–7.7)
Neutrophils Relative %: 60 %
Platelets: 206 10*3/uL (ref 150–400)
RBC: 4.17 MIL/uL (ref 3.87–5.11)
RDW: 14.4 % (ref 11.5–15.5)
WBC: 6.3 10*3/uL (ref 4.0–10.5)
nRBC: 0 % (ref 0.0–0.2)

## 2023-06-27 LAB — COMPREHENSIVE METABOLIC PANEL
ALT: 28 U/L (ref 0–44)
AST: 32 U/L (ref 15–41)
Albumin: 3.4 g/dL — ABNORMAL LOW (ref 3.5–5.0)
Alkaline Phosphatase: 67 U/L (ref 38–126)
Anion gap: 5 (ref 5–15)
BUN: 27 mg/dL — ABNORMAL HIGH (ref 8–23)
CO2: 24 mmol/L (ref 22–32)
Calcium: 8.4 mg/dL — ABNORMAL LOW (ref 8.9–10.3)
Chloride: 110 mmol/L (ref 98–111)
Creatinine, Ser: 1.22 mg/dL — ABNORMAL HIGH (ref 0.44–1.00)
GFR, Estimated: 44 mL/min — ABNORMAL LOW (ref >60)
Glucose, Bld: 97 mg/dL (ref 70–99)
Potassium: 4.8 mmol/L (ref 3.5–5.1)
Sodium: 139 mmol/L (ref 135–145)
Total Bilirubin: 0.6 mg/dL (ref 0.3–1.2)
Total Protein: 6.6 g/dL (ref 6.5–8.1)

## 2023-06-27 LAB — URINALYSIS, ROUTINE W REFLEX MICROSCOPIC
Bilirubin Urine: NEGATIVE
Glucose, UA: NEGATIVE mg/dL
Hgb urine dipstick: NEGATIVE
Ketones, ur: NEGATIVE mg/dL
Nitrite: POSITIVE — AB
Protein, ur: 100 mg/dL — AB
Specific Gravity, Urine: 1.041 — ABNORMAL HIGH (ref 1.005–1.030)
pH: 5 (ref 5.0–8.0)

## 2023-06-27 LAB — TROPONIN I (HIGH SENSITIVITY)
Troponin I (High Sensitivity): 9 ng/L (ref <18)
Troponin I (High Sensitivity): 12 ng/L (ref <18)

## 2023-06-27 LAB — LIPASE, BLOOD: Lipase: 40 U/L (ref 11–51)

## 2023-06-27 MED ORDER — ONDANSETRON HCL 4 MG/2ML IJ SOLN
4.0000 mg | Freq: Once | INTRAMUSCULAR | Status: AC
  Administered 2023-06-27: 4 mg via INTRAVENOUS
  Filled 2023-06-27: qty 2

## 2023-06-27 MED ORDER — SODIUM CHLORIDE 0.9 % IV SOLN
1.0000 g | Freq: Once | INTRAVENOUS | Status: AC
  Administered 2023-06-27: 1 g via INTRAVENOUS
  Filled 2023-06-27: qty 10

## 2023-06-27 MED ORDER — IOHEXOL 300 MG/ML  SOLN
80.0000 mL | Freq: Once | INTRAMUSCULAR | Status: AC | PRN
  Administered 2023-06-27: 80 mL via INTRAVENOUS

## 2023-06-27 MED ORDER — ONDANSETRON 4 MG PO TBDP: 4.0000 mg | ORAL_TABLET | Freq: Three times a day (TID) | ORAL | 0 refills | Status: DC | PRN

## 2023-06-27 MED ORDER — CEPHALEXIN 500 MG PO CAPS: 500.0000 mg | ORAL_CAPSULE | Freq: Two times a day (BID) | ORAL | 0 refills | Status: AC

## 2023-06-27 MED ORDER — MORPHINE SULFATE (PF) 2 MG/ML IV SOLN
2.0000 mg | INTRAVENOUS | Status: AC
  Administered 2023-06-27: 2 mg via INTRAVENOUS
  Filled 2023-06-27: qty 1

## 2023-06-27 NOTE — Discharge Instructions (Signed)
You were seen for your abdominal pain and were found to have a urinary tract infection in the emergency department.   At home, please take the antibiotics we have prescribed you.  Please also take the Zofran for any nausea or vomiting that you may have.  Follow-up with your primary doctor in 2-3 days regarding your visit.   Return immediately to the emergency department if you experience any of the following: high fevers, severe flank pain, or any other concerning symptoms.    Thank you for visiting our Emergency Department. It was a pleasure taking care of you today.

## 2023-06-27 NOTE — ED Provider Notes (Signed)
[place] EMERGENCY DEPARTMENT AT [facility] Provider Note   CSN: [id number] Arrival date & time: 06/27/23  1146     History  Chief Complaint  Patient presents with   Abdominal Pain    Alexis Shelton is a 82 y.o. female.  82 year old female with history of gallstones status post cholecystectomy, hypertension, hyperlipidemia who presents emergency department with abdominal pain.  Patient reports that last night she started feeling queasy and bloated.  This morning she started having some spasms in her left upper quadrant that were very painful.  Worse with sitting up and better with lying down.  Not exertional.  No shortness of breath or cough that is new.  Also has some sweating with it.  No vomiting but did have some diaphoresis.  No fevers.  Last bowel movement this morning.  Unsure if she is passing gas still.  No other abdominal surgeries aside from the cholecystectomy.  No fall or drug use.       Home Medications Prior to Admission medications   Medication Sig Start Date End Date Taking? Authorizing Provider  cephALEXin (KEFLEX) 500 MG capsule Take 1 capsule (500 mg total) by mouth 2 (two) times daily for 7 days. 06/27/23 07/04/23 Yes Rondel Baton, MD  ondansetron (ZOFRAN-ODT) 4 MG disintegrating tablet Take 1 tablet (4 mg total) by mouth every 8 (eight) hours as needed for nausea or vomiting. 06/27/23  Yes Rondel Baton, MD  allopurinol (ZYLOPRIM) 100 MG tablet Take 1 tablet (100 mg total) by mouth 2 (two) times daily. 04/10/23   Etta Grandchild, MD  ASPIRIN LOW DOSE 81 MG tablet TAKE 1 TABLET (81 MG TOTAL) BY MOUTH DAILY. SWALLOW WHOLE. 04/02/23   Etta Grandchild, MD  budesonide-formoterol [facility]) 160-4.5 MCG/ACT inhaler Inhale 2 puffs into the lungs daily as needed (sob/wheezing). 10/23/22   Etta Grandchild, MD  colchicine 0.6 MG tablet Take 1 tablet (0.6 mg total) by mouth daily as needed. 06/04/23   Gearldine Bienenstock, PA-C  levothyroxine (SYNTHROID) 88  MCG tablet Take 1 tablet (88 mcg total) by mouth daily before breakfast. 04/23/23   Etta Grandchild, MD  methylPREDNISolone (MEDROL) 4 MG tablet Medrol dose pack. Take as instructed Patient not taking: Reported on 06/04/2023 05/01/23   Kathryne Hitch, MD  metoprolol succinate (TOPROL-XL) 25 MG 24 hr tablet TAKE 1 TABLET (25 MG TOTAL) BY MOUTH DAILY. 04/13/23   Etta Grandchild, MD  modafinil (PROVIGIL) 100 MG tablet Take 1 tablet (100 mg total) by mouth daily. 05/08/23   Etta Grandchild, MD  montelukast (SINGULAIR) 10 MG tablet TAKE 1 TABLET BY MOUTH EVERYDAY AT BEDTIME 04/10/23   Etta Grandchild, MD  Multiple Vitamin (MULTIVITAMIN) tablet Take 1 tablet by mouth daily. 12/25/22   Etta Grandchild, MD  rosuvastatin (CRESTOR) 10 MG tablet Take 1 tablet (10 mg total) by mouth daily. 04/23/23   Etta Grandchild, MD  tiZANidine (ZANAFLEX) 4 MG tablet TAKE 1 TABLET BY MOUTH AT BEDTIME AS NEEDED FOR MUSCLE SPASMS. 04/10/23   Etta Grandchild, MD  traMADol (ULTRAM) 50 MG tablet Take 1 tablet (50 mg total) by mouth every 6 (six) hours as needed. Patient taking differently: Take 50 mg by mouth at bedtime. 04/10/23   Etta Grandchild, MD      Allergies    Patient has no known allergies.    Review of Systems   Review of Systems  Physical Exam Updated Vital Signs BP (!) 169/52  Pulse (!) 57   Temp 97.9 F (36.6 C) (Oral)   Resp (!) 26   SpO2 97%  Physical Exam Vitals and nursing note reviewed.  Constitutional:      General: She is not in acute distress.    Appearance: She is well-developed.  HENT:     Head: Normocephalic and atraumatic.     Right Ear: External ear normal.     Left Ear: External ear normal.     Nose: Nose normal.  Eyes:     Extraocular Movements: Extraocular movements intact.     Conjunctiva/sclera: Conjunctivae normal.     Pupils: Pupils are equal, round, and reactive to light.  Cardiovascular:     Rate and Rhythm: Normal rate and regular rhythm.     Heart sounds: No murmur  heard. Pulmonary:     Effort: Pulmonary effort is normal. No respiratory distress.  Abdominal:     General: Abdomen is flat. There is no distension.     Palpations: Abdomen is soft. There is no mass.     Tenderness: There is abdominal tenderness. There is no guarding.  Musculoskeletal:     Cervical back: Normal range of motion and neck supple.  Skin:    General: Skin is warm and dry.  Neurological:     Mental Status: She is alert and oriented to person, place, and time. Mental status is at baseline.  Psychiatric:        Mood and Affect: Mood normal.     ED Results / Procedures / Treatments   Labs (all labs ordered are listed, but only abnormal results are displayed) Labs Reviewed  COMPREHENSIVE METABOLIC PANEL - Abnormal; Notable for the following components:      Result Value   BUN 27 (*)    Creatinine, Ser 1.22 (*)    Calcium 8.4 (*)    Albumin 3.4 (*)    GFR, Estimated 44 (*)    All other components within normal limits  URINALYSIS, ROUTINE W REFLEX MICROSCOPIC - Abnormal; Notable for the following components:   Specific Gravity, Urine 1.041 (*)    Protein, ur 100 (*)    Nitrite POSITIVE (*)    Leukocytes,Ua SMALL (*)    Bacteria, UA MANY (*)    All other components within normal limits  I-STAT CHEM 8, ED - Abnormal; Notable for the following components:   BUN 27 (*)    Creatinine, Ser 1.30 (*)    Calcium, Ion 1.12 (*)    All other components within normal limits  URINE CULTURE  LIPASE, BLOOD  CBC WITH DIFFERENTIAL/PLATELET  TROPONIN I (HIGH SENSITIVITY)  TROPONIN I (HIGH SENSITIVITY)    EKG EKG Interpretation Date/Time:  Thursday June 27 2023 12:31:45 EDT Ventricular Rate:  59 PR Interval:  150 QRS Duration:  97 QT Interval:  428 QTC Calculation: 424 R Axis:   75  Text Interpretation: Sinus rhythm Confirmed by Vonita Moss [phone]) on 06/27/2023 12:33:59 PM  Radiology CT ABDOMEN PELVIS W CONTRAST  Result Date: 06/27/2023 CLINICAL DATA:  Left upper  quadrant pain since yesterday. EXAM: CT ABDOMEN AND PELVIS WITH CONTRAST TECHNIQUE: Multidetector CT imaging of the abdomen and pelvis was performed using the standard protocol following bolus administration of intravenous contrast. RADIATION DOSE REDUCTION: This exam was performed according to the departmental dose-optimization program which includes automated exposure control, adjustment of the mA and/or kV according to patient size and/or use of iterative reconstruction technique. CONTRAST:  80mL OMNIPAQUE IOHEXOL 300 MG/ML  SOLN COMPARISON:  12/08/2021  FINDINGS: Lower chest: Unremarkable. Hepatobiliary: No suspicious focal abnormality within the liver parenchyma. Pneumobilia suggests previous sphincterotomy. Cholecystectomy. Pancreas: No focal mass lesion. No dilatation of the main duct. No intraparenchymal cyst. No peripancreatic edema. Spleen: No splenomegaly. No suspicious focal mass lesion. Adrenals/Urinary Tract: No adrenal nodule or mass. Cortical scarring noted in both kidneys. Tiny well-defined homogeneous low-density lesions in both kidneys are too small to characterize but are statistically most likely benign and probably cysts. No followup imaging is recommended. No evidence for hydroureter. The urinary bladder appears normal for the degree of distention. Stomach/Bowel: Tiny hiatal hernia. Stomach otherwise unremarkable. Duodenum is normally positioned as is the ligament of Treitz. No small bowel wall thickening. No small bowel dilatation. The terminal ileum is normal. The appendix is normal. No gross colonic mass. No colonic wall thickening. Diverticular changes are noted in the left colon without evidence of diverticulitis. Vascular/Lymphatic: There is moderate atherosclerotic calcification of the abdominal aorta without aneurysm. There is no gastrohepatic or hepatoduodenal ligament lymphadenopathy. No retroperitoneal or mesenteric lymphadenopathy. No pelvic sidewall lymphadenopathy. Reproductive:  Hysterectomy.  There is no adnexal mass. Other: No intraperitoneal free fluid. Musculoskeletal: Small umbilical hernia contains only fat. No worrisome lytic or sclerotic osseous abnormality. 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 IMPRESSION: 1. No acute findings in the abdomen or pelvis. Specifically, no findings to explain the patient's history of left upper quadrant pain. 2. Left colonic diverticulosis without diverticulitis. 3. Tiny hiatal hernia. 4.  Aortic Atherosclerosis (ICD10-I70.0). Electronically Signed   By: Kennith Center M.D.   On: 06/27/2023 13:18    Procedures Procedures    Medications Ordered in ED Medications  ondansetron (ZOFRAN) injection 4 mg (4 mg Intravenous Given 06/27/23 1312)  morphine (PF) 2 MG/ML injection 2 mg (2 mg Intravenous Given 06/27/23 1312)  iohexol (OMNIPAQUE) 300 MG/ML solution 80 mL (80 mLs Intravenous Contrast Given 06/27/23 1256)  cefTRIAXone (ROCEPHIN) 1 g in sodium chloride 0.9 % 100 mL IVPB (0 g Intravenous Stopped 06/27/23 1709)    ED Course/ Medical Decision Making/ A&P Clinical Course as of 06/27/23 1710  Thu Jun 27, 2023  1344 Creatinine(!): 1.22 At baseline [RP]    Clinical Course User Index [RP] Rondel Baton, MD                             Medical Decision Making Amount and/or Complexity of Data Reviewed Labs: ordered. Decision-making details documented in ED Course. Radiology: ordered.  Risk Prescription drug management.   Abriela Befort is a 82 y.o. female with comorbidities that complicate the patient evaluation including gallstones status post cholecystectomy, hypertension, hyperlipidemia who presents emergency department with abdominal pain.   Initial Ddx:  Small bowel obstruction, MI, gastroenteritis, pancreatitis, splenic injury  MDM/Course:  Patient is concerned about small bowel obstruction or gastroenteritis given the patient's symptoms.  Did have a CT scan of the abdomen pelvis did not reveal any  acute abnormalities.  Did also send EKG and troponin given her age and risk factors in case this is reflective of an MI.  These are reassuring.  Did have a urinalysis that was consistent with a urinary tract infection.  Not having systemic symptoms otherwise that would be concerning for pyelonephritis.  Was given an IV dose of ceftriaxone in the emergency department and sent home with Keflex.  Upon re-evaluation patient improved.  Did have transient desaturation in the emergency department after receiving her morphine.  She was taken off nasal  cannula and was maintaining her oxygen saturations.  Will her follow-up with her primary doctor in several days regarding her symptoms.  This patient presents to the ED for concern of complaints listed in HPI, this involves an extensive number of treatment options, and is a complaint that carries with it a high risk of complications and morbidity. Disposition including potential need for admission considered.   Dispo: DC Home. Return precautions discussed including, but not limited to, those listed in the AVS. Allowed pt time to ask questions which were answered fully prior to dc.  Records reviewed Outpatient Clinic Notes The following labs were independently interpreted: Urinalysis and show urinary tract infection I independently reviewed the following imaging with scope of interpretation limited to determining acute life threatening conditions related to emergency care: CT Abdomen/Pelvis and agree with the radiologist interpretation with the following exceptions: none I personally reviewed and interpreted cardiac monitoring: normal sinus rhythm  I personally reviewed and interpreted the pt's EKG: see above for interpretation  I have reviewed the patients home medications and made adjustments as needed Social Determinants of health:  Elderly         Final Clinical Impression(s) / ED Diagnoses Final diagnoses:  Left upper quadrant abdominal pain   Nausea  Acute cystitis without hematuria    Rx / DC Orders ED Discharge Orders          Ordered    cephALEXin (KEFLEX) 500 MG capsule  2 times daily        06/27/23 1644    ondansetron (ZOFRAN-ODT) 4 MG disintegrating tablet  Every 8 hours PRN        06/27/23 1644              Rondel Baton, MD 06/27/23 1710

## 2023-06-27 NOTE — ED Triage Notes (Signed)
Pt BIBA w/ c/o L upper quad pain since yesterday. Felt like "muscle spams. Hx of gallstones and felt the same. Denies no CP, cardiac hx, or palpitations. Hx HTN. Given 4mg  zofran en route.   BP 180/100 HR 60 RR 16 CBG 130

## 2023-06-29 LAB — URINE CULTURE: Culture: >=100000 — AB

## 2023-06-30 LAB — URINE CULTURE

## 2023-07-01 LAB — URINE CULTURE

## 2023-07-02 ENCOUNTER — Ambulatory Visit (INDEPENDENT_AMBULATORY_CARE_PROVIDER_SITE_OTHER): Payer: Medicare HMO | Admitting: Internal Medicine

## 2023-07-02 ENCOUNTER — Telehealth: Payer: Self-pay

## 2023-07-02 ENCOUNTER — Encounter: Payer: Self-pay | Admitting: Internal Medicine

## 2023-07-02 VITALS — BP 120/72 | HR 65 | Temp 98.8°F | Ht 66.0 in | Wt 174.0 lb

## 2023-07-02 DIAGNOSIS — M545 Low back pain, unspecified: Secondary | ICD-10-CM | POA: Diagnosis not present

## 2023-07-02 DIAGNOSIS — G8929 Other chronic pain: Secondary | ICD-10-CM | POA: Diagnosis not present

## 2023-07-02 DIAGNOSIS — N390 Urinary tract infection, site not specified: Secondary | ICD-10-CM

## 2023-07-02 DIAGNOSIS — R400 Somnolence: Secondary | ICD-10-CM

## 2023-07-02 DIAGNOSIS — R7303 Prediabetes: Secondary | ICD-10-CM | POA: Diagnosis not present

## 2023-07-02 DIAGNOSIS — M170 Bilateral primary osteoarthritis of knee: Secondary | ICD-10-CM

## 2023-07-02 NOTE — Telephone Encounter (Signed)
Transition Care Management Follow-up Telephone Call Date of discharge and from where: 06/27/2023 [facility] How have you been since you were released from the hospital? Patient is feeling better Any questions or concerns? No  Items Reviewed: Did the pt receive and understand the discharge instructions provided? Yes  Medications obtained and verified? Yes  Other? No  Any new allergies since your discharge? No  Dietary orders reviewed? Yes Do you have support at home? Yes   Follow up appointments reviewed:  PCP Hospital f/u appt confirmed? Yes  Scheduled to see Corwin Levins, MD on 07/02/2023 @ [facility]. Specialist Hospital f/u appt confirmed? No  Scheduled to see  on  @ . Are transportation arrangements needed? No  If their condition worsens, is the pt aware to call PCP or go to the Emergency Dept.? Yes Was the patient provided with contact information for the PCP's office or ED? Yes Was to pt encouraged to call back with questions or concerns? Yes  Royetta Probus [facility]  [facility] Population Health Community Resource Care Guide   [email or web address]  ?? [phone]   Website: [email or web address]  [email or web address]

## 2023-07-02 NOTE — Telephone Encounter (Signed)
Post ED Visit - Positive Culture Follow-up  Culture report reviewed by antimicrobial stewardship pharmacist: [facility] []  Enzo Bi, Pharm.D. []  Celedonio Miyamoto, Pharm.D., BCPS AQ-ID []  Garvin Fila, Pharm.D., BCPS [x]  Georgina Pillion, Pharm.D., BCPS []  Abrams, [place].D., BCPS, AAHIVP []  Estella Husk, Pharm.D., BCPS, AAHIVP []  Lysle Pearl, PharmD, BCPS []  Phillips Climes, PharmD, BCPS []  Agapito Games, PharmD, BCPS []  Verlan Friends, PharmD []  Mervyn Gay, PharmD, BCPS []  Vinnie Level, PharmD  WrmD   Positive urine culture Treated with Cephalexin, organism sensitive to the same and no further patient follow-up is required at this time.  Bing Quarry 07/02/2023, 9:19 AM

## 2023-07-02 NOTE — Progress Notes (Signed)
Patient ID: Alexis Shelton, female   DOB: [birth year], 82 y.o.   MRN: [id number]        Chief Complaint: follow up UTI, daytime somnolence, bilateral knee pain, chronic lbp, arthritis, low back pain, preDM       HPI:  Alexis Shelton is a 82 y.o. female here after seen in ED 7/4 with abd pain, with finding of UTI tx with cephalexin, urine culture c/w multipathogen infection, and now states improved  - Denies urinary symptoms such as dysuria, frequency, urgency, flank pain, hematuria or n/v, fever, chills.  Course has been complicated by worsening fatigue and sleepiness, but will continue the provigil, and improved today.  C/o pain all over, but worst at the bilateral knees with more instability recently but no falls, and Pt continues to have recurring LBP without change in severity, bowel or bladder change, fever, wt loss,  worsening LE pain/numbness/weakness, gait change or falls.  Has ortho f/u in 2 days  Pt denies chest pain, increased sob or doe, wheezing, orthopnea, PND, increased LE swelling, palpitations, dizziness or syncope.   Pt denies polydipsia, polyuria, or new focal neuro s/s.          Wt Readings from Last 3 Encounters:  07/02/23 174 lb (78.9 kg)  06/19/23 172 lb (78 kg)  06/04/23 172 lb 6.4 oz (78.2 kg)   BP Readings from Last 3 Encounters:  07/02/23 120/72  06/27/23 (!) 169/52  06/04/23 (!) 180/71         Past Medical History:  Diagnosis Date   Arthritis    DDD (degenerative disc disease), lumbar 11/24/2018   Dizziness    Dyspnea    Elevated liver function tests    Gallstones    Hearing loss    History of stroke involving cerebellum 10/13/2018   Hypercholesteremia    Hypertension    Hypothyroidism    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   Migraines    Mild intermittent asthma without complication 02/11/2019   Primary osteoarthritis of both feet 11/24/2018   Rheumatoid arthritis (HCC) 07/04/2018   Stage 3 chronic kidney disease (HCC) 02/11/2019    Vitamin D deficiency 02/11/2019   Past Surgical History:  Procedure Laterality Date   ABDOMINAL HYSTERECTOMY     BREAST REDUCTION SURGERY Bilateral    CATARACT EXTRACTION, BILATERAL     CHOLECYSTECTOMY N/A 12/11/2021   Procedure: LAPAROSCOPIC CHOLECYSTECTOMY;  Surgeon: Almond Lint, MD;  Location: [facility];  Service: General;  Laterality: N/A;   ENDOSCOPIC RETROGRADE CHOLANGIOPANCREATOGRAPHY (ERCP) WITH PROPOFOL N/A 12/09/2021   Procedure: ENDOSCOPIC RETROGRADE CHOLANGIOPANCREATOGRAPHY (ERCP) WITH PROPOFOL;  Surgeon: Rachael Fee, MD;  Location: [facility];  Service: Endoscopy;  Laterality: N/A;   INCISION AND DRAINAGE BREAST ABSCESS Left    REDUCTION MAMMAPLASTY     REMOVAL OF STONES  12/09/2021   Procedure: REMOVAL OF STONES;  Surgeon: Rachael Fee, MD;  Location: [facility];  Service: Endoscopy;;   REPLACEMENT TOTAL KNEE Bilateral    SPHINCTEROTOMY  12/09/2021   Procedure: SPHINCTEROTOMY;  Surgeon: Rachael Fee, MD;  Location: [facility];  Service: Endoscopy;;   TONSILLECTOMY AND ADENOIDECTOMY     TOOTH EXTRACTION  2023   x2    reports that she quit smoking about 54 years ago. Her smoking use included cigarettes. She has a 0.30 pack-year smoking history. She has never been exposed to tobacco smoke. She has never used smokeless tobacco. She reports that she does not currently use alcohol. She reports that she does not use  drugs. family history includes Arthritis in her mother and sister; Bipolar disorder in her son; GER disease in her maternal grandmother; Gallbladder disease in her maternal grandmother; Healthy in her son; Heart disease in her father, paternal uncle, and sister; Hyperlipidemia in her mother; Stroke in her mother. No Known Allergies Current Outpatient Medications on File Prior to Visit  Medication Sig Dispense Refill   allopurinol (ZYLOPRIM) 100 MG tablet Take 1 tablet (100 mg total) by mouth 2 (two) times daily. 180 tablet 3   ASPIRIN LOW DOSE 81 MG  tablet TAKE 1 TABLET (81 MG TOTAL) BY MOUTH DAILY. SWALLOW WHOLE. 90 tablet 3   budesonide-formoterol (SYMBICORT) 160-4.5 MCG/ACT inhaler Inhale 2 puffs into the lungs daily as needed (sob/wheezing). 30.6 g 1   cephALEXin (KEFLEX) 500 MG capsule Take 1 capsule (500 mg total) by mouth 2 (two) times daily for 7 days. 14 capsule 0   colchicine 0.6 MG tablet Take 1 tablet (0.6 mg total) by mouth daily as needed. 90 tablet 0   levothyroxine (SYNTHROID) 88 MCG tablet Take 1 tablet (88 mcg total) by mouth daily before breakfast. 90 tablet 1   modafinil (PROVIGIL) 100 MG tablet Take 1 tablet (100 mg total) by mouth daily. 30 tablet 2   montelukast (SINGULAIR) 10 MG tablet TAKE 1 TABLET BY MOUTH EVERYDAY AT BEDTIME 90 tablet 1   Multiple Vitamin (MULTIVITAMIN) tablet Take 1 tablet by mouth daily. 90 tablet 1   ondansetron (ZOFRAN-ODT) 4 MG disintegrating tablet Take 1 tablet (4 mg total) by mouth every 8 (eight) hours as needed for nausea or vomiting. 12 tablet 0   rosuvastatin (CRESTOR) 10 MG tablet Take 1 tablet (10 mg total) by mouth daily. 90 tablet 1   SHINGRIX injection      tiZANidine (ZANAFLEX) 4 MG tablet TAKE 1 TABLET BY MOUTH AT BEDTIME AS NEEDED FOR MUSCLE SPASMS. 90 tablet 1   traMADol (ULTRAM) 50 MG tablet Take 1 tablet (50 mg total) by mouth every 6 (six) hours as needed. (Patient taking differently: Take 50 mg by mouth at bedtime.) 120 tablet 1   No current facility-administered medications on file prior to visit.        ROS:  All others reviewed and negative.  Objective        PE:  BP 120/72 (BP Location: Right Arm, Patient Position: Sitting, Cuff Size: Normal)   Pulse 65   Temp 98.8 F (37.1 C) (Oral)   Ht 5\' 6"  (1.676 m)   Wt 174 lb (78.9 kg)   SpO2 94%   BMI 28.08 kg/m                 Constitutional: Pt appears in NAD               HENT: Head: NCAT.                Right Ear: External ear normal.                 Left Ear: External ear normal.                Eyes: . Pupils  are equal, round, and reactive to light. Conjunctivae and EOM are normal               Nose: without d/c or deformity               Neck: Neck supple. Gross normal ROM  Cardiovascular: Normal rate and regular rhythm.                 Pulmonary/Chest: Effort normal and breath sounds without rales or wheezing.                Abd:  Soft, NT, ND, + BS, no organomegaly               Neurological: Pt is alert. At baseline orientation, motor grossly intact               Skin: Skin is warm. No rashes, no other new lesions, LE edema - none               Psychiatric: Pt behavior is normal without agitation   Micro: none  Cardiac tracings I have personally interpreted today:  none  Pertinent Radiological findings (summarize): none   Lab Results  Component Value Date   WBC 6.3 06/27/2023   HGB 13.9 06/27/2023   HCT 41.0 06/27/2023   PLT 206 06/27/2023   GLUCOSE 99 06/27/2023   CHOL 260 (H) 06/06/2022   TRIG 249.0 (H) 06/06/2022   HDL 39.30 06/06/2022   LDLDIRECT 175.0 06/06/2022   LDLCALC 101 (H) 03/06/2021   ALT 28 06/27/2023   AST 32 06/27/2023   NA 139 06/27/2023   K 4.8 06/27/2023   CL 108 06/27/2023   CREATININE 1.30 (H) 06/27/2023   BUN 27 (H) 06/27/2023   CO2 24 06/27/2023   TSH 0.66 04/22/2023   INR 1.1 12/08/2021   HGBA1C 6.2 10/02/2022   Specimen Description URINE, CLEAN CATCH Performed at [facility], [address] [place], [place] [address]  Special Requests NONE Performed at [facility], [address], [place], [place] [address]  Culture  Abnormal  >=100,000 COLONIES/mL ESCHERICHIA COLI 90,000 COLONIES/mL KLEBSIELLA OXYTOCA   Report Status 07/01/2023 FINAL  Organism ID, Bacteria ESCHERICHIA COLI Abnormal   Organism ID, Bacteria KLEBSIELLA OXYTOCA Abnormal   Resulting Agency [facility]     Susceptibility   Escherichia coli Klebsiella oxytoca   MIC MIC   AMPICILLIN >=32 RESIST... Resistant >=32 RESIST...  Resistant   AMPICILLIN/SULBACTAM >=32 RESIST... Resistant 8 SENSITIVE Sensitive   CEFAZOLIN 8 SENSITIVE Sensitive     CEFEPIME <=0.12 SENS... Sensitive <=0.12 SENS... Sensitive   CEFTRIAXONE 1 SENSITIVE Sensitive <=0.25 SENS... Sensitive   CIPROFLOXACIN <=0.25 SENS... Sensitive <=0.25 SENS... Sensitive   GENTAMICIN <=1 SENSITIVE Sensitive <=1 SENSITIVE Sensitive   IMIPENEM <=0.25 SENS... Sensitive <=0.25 SENS... Sensitive   NITROFURANTOIN <=16 SENSIT... Sensitive <=16 SENSIT... Sensitive   PIP/TAZO 16 SENSITIVE Sensitive <=4 SENSITIVE Sensitive   TRIMETH/SULFA <=20 SENSIT... Sensitive <=20 SENSIT... Sensitive                  Assessment/Plan:  Alexis Shelton is a 82 y.o. White or Caucasian [1] female with  has a past medical history of Arthritis, DDD (degenerative disc disease), lumbar (11/24/2018), Dizziness, Dyspnea, Elevated liver function tests, Gallstones, Hearing loss, History of stroke involving cerebellum (10/13/2018), Hypercholesteremia, Hypertension, Hypothyroidism, Idiopathic gout of multiple sites (08/12/2018), Migraines, Mild intermittent asthma without complication (02/11/2019), Primary osteoarthritis of both feet (11/24/2018), Rheumatoid arthritis (HCC) (07/04/2018), Stage 3 chronic kidney disease (HCC) (02/11/2019), and Vitamin D deficiency (02/11/2019).  Chronic low back pain Chronic stable, continue tramadol prn, f/u ortho as planned  Primary osteoarthritis of both knees Chronic with mild worsening pain and stability per pt, continue tramadol prn, f/u ortho as planned  Prediabetes Lab Results  Component  Value Date   HGBA1C 6.2 10/02/2022   Stable, pt to continue current medical treatment  - diet, wt control   Uncontrolled daytime somnolence Overall stable, cont provigil  UTI (urinary tract infection) Clinically improved, pt to finish cephalexin course,  to f/u any worsening symptoms or concerns  Followup: Return if symptoms worsen or fail to  improve.  Oliver Barre, MD 07/03/2023 8:21 PM [facility] Internal Medicine

## 2023-07-02 NOTE — Progress Notes (Signed)
ED Antimicrobial Stewardship Positive Culture Follow Up   Alexis Shelton is an 82 y.o. female who presented to [facility] on 06/27/2023 with a chief complaint of  Chief Complaint  Patient presents with   Abdominal Pain    Recent Results (from the past 720 hour(s))  Urine Culture     Status: Abnormal   Collection Time: 06/27/23  2:52 PM   Specimen: Urine, Clean Catch  Result Value Ref Range Status   Specimen Description   Final    URINE, CLEAN CATCH Performed at [facility], [address], [place], [place] [address]    Special Requests   Final    NONE Performed at [facility], [address] [place], [place] [address]    Culture (A)  Final    >=100,000 COLONIES/mL ESCHERICHIA COLI 90,000 COLONIES/mL KLEBSIELLA OXYTOCA    Report Status 07/01/2023 FINAL  Final   Organism ID, Bacteria ESCHERICHIA COLI (A)  Final   Organism ID, Bacteria KLEBSIELLA OXYTOCA (A)  Final      Susceptibility   Escherichia coli - MIC*    AMPICILLIN >=32 RESISTANT Resistant     CEFAZOLIN 8 SENSITIVE Sensitive     CEFEPIME <=0.12 SENSITIVE Sensitive     CEFTRIAXONE 1 SENSITIVE Sensitive     CIPROFLOXACIN <=0.25 SENSITIVE Sensitive     GENTAMICIN <=1 SENSITIVE Sensitive     IMIPENEM <=0.25 SENSITIVE Sensitive     NITROFURANTOIN <=16 SENSITIVE Sensitive     TRIMETH/SULFA <=20 SENSITIVE Sensitive     AMPICILLIN/SULBACTAM >=32 RESISTANT Resistant     PIP/TAZO 16 SENSITIVE Sensitive     * >=100,000 COLONIES/mL ESCHERICHIA COLI   Klebsiella oxytoca - MIC*    AMPICILLIN >=32 RESISTANT Resistant     CEFEPIME <=0.12 SENSITIVE Sensitive     CEFTRIAXONE <=0.25 SENSITIVE Sensitive     CIPROFLOXACIN <=0.25 SENSITIVE Sensitive     GENTAMICIN <=1 SENSITIVE Sensitive     IMIPENEM <=0.25 SENSITIVE Sensitive     NITROFURANTOIN <=16 SENSITIVE Sensitive     TRIMETH/SULFA <=20 SENSITIVE Sensitive     AMPICILLIN/SULBACTAM 8 SENSITIVE Sensitive     PIP/TAZO <=4 SENSITIVE  Sensitive     * 90,000 COLONIES/mL KLEBSIELLA OXYTOCA    [x]  Treated with Keflex, one organism covered and the other not covered by the prescribed antimicrobial  81 YOF who presented on 7/4 with LUQ pain/spasms, nausea. Abd CT without acute findings, neg for pyelo. No urinary symptoms noted, pt was afebrile, WBC wnl. UTI diagnosis and treatment triggered off of UA that showed bacteria, + nitrite, 11-20 WBC.   Given lack of specific urinary symptoms and low WBC on UA - seems more consistent with colonization. Noted patient has an appointment with her PCP today (7/9) and has only 1 more day left on her cephalexin prescription. Would let her complete her course of cephalexin but would not recommend additional antibiotics to cover for the Kleb oxytoca at this time.   No additional antibiotic needed at this time - finish cephalexin course as prescribed since only 1 more day left.   ED Provider: Lynden Oxford, MD  Thank you for allowing pharmacy to be a part of this patient's care.  Georgina Pillion, PharmD, BCPS, BCIDP Infectious Diseases Clinical Pharmacist 07/02/2023 8:41 AM   **Pharmacist phone directory can now be found on [email or web address] ([email or web address]).  Listed under [facility].

## 2023-07-03 ENCOUNTER — Encounter: Payer: Self-pay | Admitting: Internal Medicine

## 2023-07-03 ENCOUNTER — Ambulatory Visit: Payer: Medicare HMO | Admitting: Orthopaedic Surgery

## 2023-07-03 ENCOUNTER — Other Ambulatory Visit: Payer: Self-pay | Admitting: Internal Medicine

## 2023-07-03 DIAGNOSIS — I10 Essential (primary) hypertension: Secondary | ICD-10-CM

## 2023-07-03 DIAGNOSIS — N390 Urinary tract infection, site not specified: Secondary | ICD-10-CM | POA: Insufficient documentation

## 2023-07-03 DIAGNOSIS — M545 Low back pain, unspecified: Secondary | ICD-10-CM | POA: Insufficient documentation

## 2023-07-03 DIAGNOSIS — G8929 Other chronic pain: Secondary | ICD-10-CM | POA: Insufficient documentation

## 2023-07-03 MED ORDER — METOPROLOL SUCCINATE ER 25 MG PO TB24: 25.0000 mg | ORAL_TABLET | Freq: Every day | ORAL | 0 refills | Status: DC

## 2023-07-03 NOTE — Assessment & Plan Note (Signed)
Overall stable, cont provigil

## 2023-07-03 NOTE — Assessment & Plan Note (Signed)
Chronic stable, continue tramadol prn, f/u ortho as planned

## 2023-07-03 NOTE — Assessment & Plan Note (Signed)
Lab Results  Component Value Date   HGBA1C 6.2 10/02/2022   Stable, pt to continue current medical treatment  - diet, wt control

## 2023-07-03 NOTE — Assessment & Plan Note (Signed)
Chronic with mild worsening pain and stability per pt, continue tramadol prn, f/u ortho as planned

## 2023-07-03 NOTE — Patient Instructions (Signed)
Please continue all other medications as before, including the cephalaxin and tramadol, and provigil  Please have the pharmacy call with any other refills you may need.  Please continue your efforts at being more active, low cholesterol diet, and weight control.  You are otherwise up to date with prevention measures today.  Please keep your appointments with your specialists as you may have planned - orthopedic

## 2023-07-03 NOTE — Assessment & Plan Note (Signed)
Clinically improved, pt to finish cephalexin course,  to f/u any worsening symptoms or concerns

## 2023-07-08 ENCOUNTER — Ambulatory Visit: Payer: Medicare HMO | Admitting: Psychology

## 2023-07-08 ENCOUNTER — Ambulatory Visit: Payer: Medicare HMO | Admitting: Orthopaedic Surgery

## 2023-07-08 VITALS — Ht 66.0 in | Wt 174.0 lb

## 2023-07-08 DIAGNOSIS — G8929 Other chronic pain: Secondary | ICD-10-CM

## 2023-07-08 DIAGNOSIS — M5441 Lumbago with sciatica, right side: Secondary | ICD-10-CM | POA: Diagnosis not present

## 2023-07-08 NOTE — Progress Notes (Signed)
The patient is an 82 year old female who comes in to go over MRI of her lumbar spine.  She been having right-sided low back pain with radicular symptoms going down her leg.  The previous MRI 2019 she had mainly left-sided stenosis.  This was new to her.  After the failure conservative treatment we sent her for an MRI and she is here for review this today.  She does ambulate cane.  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  On my exam today her pain is in the lower aspect of lumbar spine only to the right side.  She does not have a significant straight leg raise on the right side and has pretty good strength in her right lower extremity.  Her right hip moves smoothly and fluidly.  Her right knee has a total knee arthroplasty with good range of motion.  I did review the MRI with her.  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  Since she is doing much better in terms of just pain only being in her back, I would like to send her to Dr. Alvester Morin for likely an L4-L5 facet joint injection to the right side versus an ESI depending on what her signs and symptoms are at the time she sees him and how she is doing overall.  She is interested definitely in having an injection in her spine so we will see if we get this set up with Dr. Alvester Morin.  She is not on any blood thinning medication.

## 2023-07-08 NOTE — Addendum Note (Signed)
Addended by: Rogers Seeds on: 07/08/2023 04:10 PM   Modules accepted: Orders

## 2023-07-19 ENCOUNTER — Telehealth: Payer: Self-pay | Admitting: Orthopaedic Surgery

## 2023-07-24 ENCOUNTER — Ambulatory Visit: Payer: Medicare HMO | Admitting: Orthopaedic Surgery

## 2023-07-24 ENCOUNTER — Ambulatory Visit (INDEPENDENT_AMBULATORY_CARE_PROVIDER_SITE_OTHER): Payer: Medicare HMO | Admitting: Obstetrics and Gynecology

## 2023-07-24 ENCOUNTER — Encounter (INDEPENDENT_AMBULATORY_CARE_PROVIDER_SITE_OTHER): Payer: Self-pay

## 2023-07-24 ENCOUNTER — Other Ambulatory Visit (HOSPITAL_COMMUNITY)
Admission: RE | Admit: 2023-07-24 | Discharge: 2023-07-24 | Disposition: A | Discharge status: Home / Self Care | Payer: Medicare HMO | Source: Ambulatory Visit | Attending: Obstetrics and Gynecology | Admitting: Obstetrics and Gynecology

## 2023-07-24 ENCOUNTER — Encounter: Payer: Self-pay | Admitting: Obstetrics and Gynecology

## 2023-07-24 VITALS — BP 122/82 | HR 62 | Ht 62.25 in | Wt 173.0 lb

## 2023-07-24 DIAGNOSIS — R300 Dysuria: Secondary | ICD-10-CM | POA: Diagnosis not present

## 2023-07-24 DIAGNOSIS — Z9189 Other specified personal risk factors, not elsewhere classified: Secondary | ICD-10-CM

## 2023-07-24 DIAGNOSIS — N762 Acute vulvitis: Secondary | ICD-10-CM | POA: Insufficient documentation

## 2023-07-24 DIAGNOSIS — Z01419 Encounter for gynecological examination (general) (routine) without abnormal findings: Secondary | ICD-10-CM

## 2023-07-24 LAB — URINALYSIS, COMPLETE W/RFL CULTURE
Bacteria, UA: NONE SEEN /HPF
Bilirubin Urine: NEGATIVE
Casts: NONE SEEN /LPF
Crystals: NONE SEEN /HPF
Glucose, UA: NEGATIVE
Hgb urine dipstick: NEGATIVE
Ketones, ur: NEGATIVE
Leukocyte Esterase: NEGATIVE
Nitrites, Initial: NEGATIVE
RBC / HPF: NONE SEEN /HPF (ref 0–2)
Specific Gravity, Urine: 1.02 (ref 1.001–1.035)
Yeast: NONE SEEN /HPF
pH: 5.5 (ref 5.0–8.0)

## 2023-07-24 MED ORDER — NYSTATIN-TRIAMCINOLONE 100000-0.1 UNIT/GM-% EX OINT: 1.0000 | TOPICAL_OINTMENT | Freq: Two times a day (BID) | CUTANEOUS | 0 refills | Status: DC

## 2023-07-24 NOTE — Progress Notes (Unsigned)
82 y.o. G33P0021 Divorced Caucasian female here for annual exam, also UTI.    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  Has a painful and itchy vulva. Not using steroid ointment.   Hx rectal and urinary incontinence. PT did not help.  Has tried Metamucil. Wearing pads frequently.   UTI 06/27/23 - E Coli and Klebsiella Oxytoca. Treated with Cephalexin.   Now with itching.    PCP:   Sanda Linger, MD  No LMP recorded. Patient has had a hysterectomy.           Sexually active: No.  The current method of family planning is status post hysterectomy.    Exercising: Yes.       Smoker:  former  Health Maintenance: Pap:  unsure History of abnormal Pap:  no MMG:  09/06/22 Breast Density Cat B, BI-RADS CAT 1 neg Colonoscopy:  12/09/21 BMD:   n/a  Result  n/a TDaP:  unsure but recent per pt Gardasil:   no HIV: n/a Hep C: n/a Screening Labs:  Hb today: ***, Urine today: ***   reports that she quit smoking about 54 years ago. Her smoking use included cigarettes. She started smoking about 57 years ago. She has a 0.3 pack-year smoking history. She has never been exposed to tobacco smoke. She has never used smokeless tobacco. She reports that she does not currently use alcohol. She reports that she does not use drugs.  Past Medical History:  Diagnosis Date   Arthritis    DDD (degenerative disc disease), lumbar 11/24/2018   Dizziness    Dyspnea    Elevated liver function tests    Gallstones    Hearing loss    History of stroke involving cerebellum 10/13/2018   Hypercholesteremia    Hypertension    Hypothyroidism    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   Migraines    Mild intermittent asthma without complication 02/11/2019   Primary osteoarthritis of both feet 11/24/2018   Rheumatoid arthritis (HCC) 07/04/2018   Stage 3 chronic kidney disease (HCC) 02/11/2019   Vitamin D deficiency 02/11/2019    Past Surgical History:  Procedure Laterality Date   ABDOMINAL  HYSTERECTOMY     BREAST REDUCTION SURGERY Bilateral    CATARACT EXTRACTION, BILATERAL     CHOLECYSTECTOMY N/A 12/11/2021   Procedure: LAPAROSCOPIC CHOLECYSTECTOMY;  Surgeon: Almond Lint, MD;  Location: [facility];  Service: General;  Laterality: N/A;   ENDOSCOPIC RETROGRADE CHOLANGIOPANCREATOGRAPHY (ERCP) WITH PROPOFOL N/A 12/09/2021   Procedure: ENDOSCOPIC RETROGRADE CHOLANGIOPANCREATOGRAPHY (ERCP) WITH PROPOFOL;  Surgeon: Rachael Fee, MD;  Location: [facility];  Service: Endoscopy;  Laterality: N/A;   INCISION AND DRAINAGE BREAST ABSCESS Left    REDUCTION MAMMAPLASTY     REMOVAL OF STONES  12/09/2021   Procedure: REMOVAL OF STONES;  Surgeon: Rachael Fee, MD;  Location: [facility];  Service: Endoscopy;;   REPLACEMENT TOTAL KNEE Bilateral    SPHINCTEROTOMY  12/09/2021   Procedure: SPHINCTEROTOMY;  Surgeon: Rachael Fee, MD;  Location: [facility];  Service: Endoscopy;;   TONSILLECTOMY AND ADENOIDECTOMY     TOOTH EXTRACTION  2023   x2    Current Outpatient Medications  Medication Sig Dispense Refill   allopurinol (ZYLOPRIM) 100 MG tablet Take 1 tablet (100 mg total) by mouth 2 (two) times daily. 180 tablet 3   ASPIRIN LOW DOSE 81 MG tablet TAKE 1 TABLET (81 MG TOTAL) BY MOUTH DAILY. SWALLOW WHOLE. 90 tablet 3   budesonide-formoterol (SYMBICORT) 160-4.5 MCG/ACT inhaler Inhale 2 puffs into the  lungs daily as needed (sob/wheezing). 30.6 g 1   levothyroxine (SYNTHROID) 88 MCG tablet Take 1 tablet (88 mcg total) by mouth daily before breakfast. 90 tablet 1   metoprolol succinate (TOPROL-XL) 25 MG 24 hr tablet Take 1 tablet (25 mg total) by mouth daily. 90 tablet 0   modafinil (PROVIGIL) 100 MG tablet Take 1 tablet (100 mg total) by mouth daily. 30 tablet 2   montelukast (SINGULAIR) 10 MG tablet TAKE 1 TABLET BY MOUTH EVERYDAY AT BEDTIME 90 tablet 1   Multiple Vitamin (MULTIVITAMIN) tablet Take 1 tablet by mouth daily. 90 tablet 1   ondansetron (ZOFRAN-ODT) 4 MG disintegrating  tablet Take 1 tablet (4 mg total) by mouth every 8 (eight) hours as needed for nausea or vomiting. 12 tablet 0   rosuvastatin (CRESTOR) 10 MG tablet Take 1 tablet (10 mg total) by mouth daily. 90 tablet 1   SHINGRIX injection      tiZANidine (ZANAFLEX) 4 MG tablet TAKE 1 TABLET BY MOUTH AT BEDTIME AS NEEDED FOR MUSCLE SPASMS. 90 tablet 1   traMADol (ULTRAM) 50 MG tablet Take 1 tablet (50 mg total) by mouth every 6 (six) hours as needed. (Patient taking differently: Take 50 mg by mouth at bedtime.) 120 tablet 1   UBRELVY 100 MG TABS Take by mouth.     No current facility-administered medications for this visit.    Family History  Problem Relation Age of Onset   Stroke Mother    Hyperlipidemia Mother    Arthritis Mother    Heart disease Father    Arthritis Sister    Heart disease Sister    Heart disease Paternal Uncle        x 7   Gallbladder disease Maternal Grandmother        radiation   GER disease Maternal Grandmother    Bipolar disorder Son    Healthy Son    Colon cancer Neg Hx    Esophageal cancer Neg Hx    Rectal cancer Neg Hx    Stomach cancer Neg Hx    Cancer Neg Hx     Review of Systems  Genitourinary:  Positive for dysuria.    Exam:   BP 122/82 (BP Location: Left Arm, Patient Position: Sitting, Cuff Size: Normal)   Pulse 62   Ht 5' 2.25" (1.581 m)   Wt 173 lb (78.5 kg)   SpO2 94%   BMI 31.39 kg/m     General appearance: alert, cooperative and appears stated age Head: normocephalic, without obvious abnormality, atraumatic Neck: no adenopathy, supple, symmetrical, trachea midline and thyroid normal to inspection and palpation Lungs: clear to auscultation bilaterally Breasts: normal appearance, no masses or tenderness, No nipple retraction or dimpling, No nipple discharge or bleeding, No axillary adenopathy Heart: regular rate and rhythm Abdomen: soft, non-tender; no masses, no organomegaly Extremities: extremities normal, atraumatic, no cyanosis or  edema Skin: skin color, texture, turgor normal. No rashes or lesions Lymph nodes: cervical, supraclavicular, and axillary nodes normal. Neurologic: grossly normal  Pelvic: External genitalia:  no lesions              No abnormal inguinal nodes palpated.              Urethra:  normal appearing urethra with no masses, tenderness or lesions              Bartholins and Skenes: normal                 Vagina: normal appearing  vagina with normal color and discharge, no lesions              Cervix: no lesions              Pap taken: {yes no:314532} Bimanual Exam:  Uterus:  normal size, contour, position, consistency, mobility, non-tender              Adnexa: no mass, fullness, tenderness              Rectal exam: {yes no:314532}.  Confirms.              Anus:  normal sphincter tone, no lesions  Chaperone was present for exam:  ***  Assessment:   Well woman visit with gynecologic exam. Status post hyst/BSO.  Lichen sclerosus.  Recent UTI.  Hx stage 3 renal disease. Hx stroke.  Plan: Mammogram screening discussed. Self breast awareness reviewed. Pap and HR HPV as above. Guidelines for Calcium, Vitamin D, regular exercise program including cardiovascular and weight bearing exercise. Urinalysis and reflex culture.  UC Vaginitis testing.  Follow up annually and prn.   Additional counseling given.  {yes [id number]. _______ minutes face to face time of which over 50% was spent in counseling.    After visit summary provided.

## 2023-07-25 NOTE — Patient Instructions (Signed)

## 2023-07-27 ENCOUNTER — Encounter: Payer: Self-pay | Admitting: Obstetrics and Gynecology

## 2023-07-29 ENCOUNTER — Ambulatory Visit (INDEPENDENT_AMBULATORY_CARE_PROVIDER_SITE_OTHER): Payer: Medicare HMO | Admitting: Psychology

## 2023-07-29 DIAGNOSIS — F3289 Other specified depressive episodes: Secondary | ICD-10-CM

## 2023-07-29 NOTE — Progress Notes (Signed)
[facility] Counselor/Therapist Progress Note  Patient ID: Zong Mogel, MRN: [id number]    Date: 07/29/23  Time Spent: 11:02  am - 12:02 am : 60 Minutes  Treatment Type: Individual Therapy.  Reported Symptoms: anxiety and depression.   Mental Status Exam: Appearance:  Neat and Well Groomed     Behavior: Appropriate  Motor: Normal  Speech/Language:  Clear and Coherent  Affect: Congruent  Mood: anxious and dysthymic  Thought process: normal  Thought content:   WNL  Sensory/Perceptual disturbances:   WNL  Orientation: oriented to person, place, time/date, and situation  Attention: Good  Concentration: Good  Memory: WNL  Fund of knowledge:  Good  Insight:   Good  Judgment:  Good  Impulse Control: Good   Risk Assessment: Danger to Self:  No Self-injurious Behavior: No Danger to Others: No Duty to Warn:no Physical Aggression / Violence:No  Access to Firearms a concern: No  Gang Involvement:No   Subjective:   [name] participated in the session, in person in the office with the therapist, and consented to treatment Adyline reviewed the events of the past week. Ambrosia noted an increase in her insurance plan cost and prescription cost and noted worry about this. She noted worry about being able to pay her medical bills. She noted missing work due to Scientist, product/process development issues, which contributes to her financial issues. She noted increased possible strain with her son, Tev,  and noted hopeful that the relationship improves. She noted her son's lack of contact despite her attempts to communicate consistently. She was tearful during the session and discussed that her son perceives her negatively. She noted that she hasn't seen her grandson, Rosary Lively, in a year.  She noted a miscarriage prior to her son's birth and noted this being painful after carrying a fetus for 3 months.She noted being stressed by state of politics and noted her part-time job being related to this.  Therapist praised Zonya for her effort and energy during the session and her vulnerability.  We scheduled a follow-up for continued treatment which she benefits from. Therapist provided supportive therapy.   Interventions: CBT and interpersonal   Diagnosis:   Other depression  Psychiatric Treatment: No   Treatment Plan:  Client Abilities/Strengths Rahni is verbose and motivated for change.   Support System: Friend: [name]  Client Treatment Preferences Outpatient therapy.  Client Statement of Needs Lafondra would like to process relationships with son and sister, managing her overall symptoms, manage overall stressors, including financials, and process past events.  Additionally, processing aging, feeling lonely, and financial stressors.   Treatment Level Weekly  Symptoms  Depression: loss of interest, feeling down, difficulty sleeping (middle insomnia) ,lethargy, fluctuating appetite (poor appetite), , trouble concentration, psychomotor retardation, and denied history of SI   (Status: maintained) Anxiety: Feeling nervous, difficulty managing worry, worrying about different things, trouble relaxing, difficulty sitting still, easily irritable, feeling afraid something awful might happen.    (Status: maintained)  Goals:   Savonna experiences symptoms of depression and anxiety.    Target Date: 12/05/23 Frequency: Weekly  Progress: 0 Modality: individual    Therapist will provide referrals for additional resources as appropriate.  Therapist will provide psycho-education regarding Olina's diagnosis and corresponding treatment approaches and interventions. Licensed Clinical Social Worker, [place], LCSW will support the patient's ability to achieve the goals identified. will employ CBT, BA, Problem-solving, Solution Focused, Mindfulness,  coping skills, & other evidenced-based practices will be used to promote progress towards healthy functioning to help manage  decrease symptoms  associated with her diagnosis.   Reduce overall level, frequency, and intensity of the feelings of depression and anxiety evidenced by decreased overall symptoms from 6 to 7 days/week to 0 to 1 days/week per client report for at least 3 consecutive months. Verbally express understanding of the relationship between feelings of depression, anxiety and their impact on thinking patterns and behaviors. Verbalize an understanding of the role that distorted thinking plays in creating fears, excessive worry, and ruminations.  [name] participated in the creation of the treatment plan)    Delight Ovens, LCSW

## 2023-07-30 ENCOUNTER — Telehealth: Payer: Self-pay | Admitting: *Deleted

## 2023-07-30 NOTE — Telephone Encounter (Signed)
I recommend she reduce the Metamucil capsules back to 2 capsules per day and increase her water intake.   The increase in fiber needs to occur slowly for body to tolerate the change.   Avoiding caffeine may also help with her bowel control.

## 2023-07-30 NOTE — Telephone Encounter (Signed)
Spoke with patient to review lab results and recommendations from 07/24/23 [facility].   Patient states Metamucil recommended for fecal incontinence, patient unsure of dosing recommendations. Started with 2 tablets PO daily, no change, increased to 5 tabs daily 2 days ago. States no change in incontinence, feels like she may becoming more constipated.  Reviewed [facility] notes for recommendations.  Advised will review with Dr. Edward Jolly and f/u, patient agreeable.   Dr. Edward Jolly -please advise on Metamucil.

## 2023-07-31 NOTE — Telephone Encounter (Signed)
Call placed to patient, left detailed message, ok per dpr.  Advised per Dr. Edward Jolly. Return call to office at [phone], option 4,  if any additional questions.    Encounter closed.

## 2023-08-05 ENCOUNTER — Other Ambulatory Visit: Payer: Self-pay

## 2023-08-05 ENCOUNTER — Ambulatory Visit (INDEPENDENT_AMBULATORY_CARE_PROVIDER_SITE_OTHER): Payer: Medicare HMO | Admitting: Physical Medicine and Rehabilitation

## 2023-08-05 VITALS — BP 184/69 | HR 60

## 2023-08-05 DIAGNOSIS — M5416 Radiculopathy, lumbar region: Secondary | ICD-10-CM

## 2023-08-05 MED ORDER — METHYLPREDNISOLONE ACETATE 80 MG/ML IJ SUSP
80.0000 mg | Freq: Once | INTRAMUSCULAR | Status: AC
Start: 2023-08-05 — End: 2023-08-05
  Administered 2023-08-05: 80 mg

## 2023-08-05 NOTE — Progress Notes (Signed)
Functional Pain Scale - descriptive words and definitions  Distracting (5)    Aware of pain/able to complete some ADL's but limited by pain/sleep is affected and active distractions are only slightly useful. Moderate range order  Average Pain  varies   +Driver, -BT, -Dye Allergies.  Lower back pain on both sides with no radiation 

## 2023-08-05 NOTE — Patient Instructions (Signed)

## 2023-08-07 ENCOUNTER — Encounter: Payer: Medicare HMO | Admitting: Internal Medicine

## 2023-08-07 ENCOUNTER — Ambulatory Visit (INDEPENDENT_AMBULATORY_CARE_PROVIDER_SITE_OTHER): Payer: Medicare HMO | Admitting: Internal Medicine

## 2023-08-07 ENCOUNTER — Encounter: Payer: Self-pay | Admitting: Internal Medicine

## 2023-08-07 VITALS — BP 140/82 | HR 87 | Temp 98.3°F | Ht 62.65 in

## 2023-08-07 DIAGNOSIS — E785 Hyperlipidemia, unspecified: Secondary | ICD-10-CM

## 2023-08-07 DIAGNOSIS — E039 Hypothyroidism, unspecified: Secondary | ICD-10-CM | POA: Diagnosis not present

## 2023-08-07 DIAGNOSIS — M1A09X0 Idiopathic chronic gout, multiple sites, without tophus (tophi): Secondary | ICD-10-CM | POA: Diagnosis not present

## 2023-08-07 DIAGNOSIS — N184 Chronic kidney disease, stage 4 (severe): Secondary | ICD-10-CM

## 2023-08-07 DIAGNOSIS — I10 Essential (primary) hypertension: Secondary | ICD-10-CM

## 2023-08-07 DIAGNOSIS — N069 Isolated proteinuria with unspecified morphologic lesion: Secondary | ICD-10-CM

## 2023-08-07 DIAGNOSIS — R7303 Prediabetes: Secondary | ICD-10-CM

## 2023-08-07 LAB — URINALYSIS, ROUTINE W REFLEX MICROSCOPIC
Bilirubin Urine: NEGATIVE
Hgb urine dipstick: NEGATIVE
Ketones, ur: NEGATIVE
Leukocytes,Ua: NEGATIVE
Nitrite: NEGATIVE
Specific Gravity, Urine: >=1.03 — AB (ref 1.000–1.030)
Total Protein, Urine: 100 — AB
Urine Glucose: NEGATIVE
Urobilinogen, UA: 0.2 (ref 0.0–1.0)
pH: 6 (ref 5.0–8.0)

## 2023-08-07 LAB — MICROALBUMIN / CREATININE URINE RATIO
Creatinine,U: 75.5 mg/dL
Microalb Creat Ratio: 113.3 mg/g — ABNORMAL HIGH (ref 0.0–30.0)
Microalb, Ur: 85.6 mg/dL — ABNORMAL HIGH (ref 0.0–1.9)

## 2023-08-07 LAB — LIPID PANEL
Cholesterol: 194 mg/dL (ref 0–200)
HDL: 51.4 mg/dL (ref >39.00)
LDL Cholesterol: 117 mg/dL — ABNORMAL HIGH (ref 0–99)
NonHDL: 142.13
Total CHOL/HDL Ratio: 4
Triglycerides: 125 mg/dL (ref 0.0–149.0)
VLDL: 25 mg/dL (ref 0.0–40.0)

## 2023-08-07 LAB — TSH: TSH: 1.57 u[IU]/mL (ref 0.35–5.50)

## 2023-08-07 LAB — URIC ACID: Uric Acid, Serum: 5.2 mg/dL (ref 2.4–7.0)

## 2023-08-07 LAB — HEMOGLOBIN A1C: Hgb A1c MFr Bld: 6 % (ref 4.6–6.5)

## 2023-08-07 NOTE — Patient Instructions (Signed)
 Chronic Kidney Disease, Adult Chronic kidney disease (CKD) occurs when the kidneys are slowly and permanently damaged over a long period of time. The kidneys are a pair of organs that do many important jobs in the body, including: Removing waste and extra fluid from the blood to make urine. Making hormones that maintain the amount of fluid in tissues and blood vessels. Maintaining the right amount of fluids and chemicals in the body. A small amount of kidney damage may not cause problems, but a large amount of damage may make it hard or impossible for the kidneys to work right. Steps must be taken to slow kidney damage or to stop it from getting worse. If steps are not taken, the kidneys may stop working permanently (end-stage renal disease, or ESRD). Most of the time, CKD does not go away, but it can often be controlled. People who have CKD are usually able to live full lives. What are the causes? The most common causes of this condition are diabetes and high blood pressure (hypertension). Other causes include: Cardiovascular diseases. These affect the heart and blood vessels. Kidney diseases. These include: Glomerulonephritis, or inflammation of the tiny filters in the kidneys. Interstitial nephritis. This is swelling of the small tubes of the kidneys and of the surrounding structures. Polycystic kidney disease, in which clusters of fluid-filled sacs form within the kidneys. Renal vascular disease. This includes disorders that affect the arteries and veins of the kidneys. Diseases that affect the body's defense system (immune system). A problem with urine flow. This may be caused by: Kidney stones. Cancer. An enlarged prostate, in males. A kidney infection or urinary tract infection (UTI) that keeps coming back. Vasculitis. This is swelling or inflammation of the blood vessels. What increases the risk? Your chances of having kidney disease increase with age. The following factors may make  you more likely to develop this condition: A family history of kidney disease or kidney failure. Kidney failure means the kidneys can no longer work right. Certain genetic diseases. Taking medicines often that are damaging to the kidneys. Being around or being in contact with toxic substances. Obesity. A history of tobacco use. What are the signs or symptoms? Symptoms of this condition include: Feeling very tired (lethargic) and having less energy. Swelling, or edema, of the face, legs, ankles, or feet. Nausea or vomiting, or loss of appetite. Confusion or trouble concentrating. Muscle twitches and cramps, especially in the legs. Dry, itchy skin. A metallic taste in the mouth. Producing less urine, or producing more urine (especially at night). Shortness of breath. Trouble sleeping. CKD may also result in not having enough red blood cells or hemoglobin in the blood (anemia) or having weak bones (bone disease). Symptoms develop slowly and may not be obvious until the kidney damage becomes severe. It is possible to have kidney disease for years without having symptoms. How is this diagnosed? This condition may be diagnosed based on: Blood tests. Urine tests. Imaging tests, such as an ultrasound or a CT scan. A kidney biopsy. This involves removing a sample of kidney tissue to be looked at under a microscope. Results from these tests will help to determine how serious the CKD is. How is this treated? There is no cure for most cases of this condition, but treatment usually relieves symptoms and prevents or slows the worsening of the disease. Treatment may include: Diet changes, which may require you to avoid alcohol and foods that are high in salt, potassium, phosphorous, and protein. Medicines. These may:  Lower blood pressure. Control blood sugar (glucose). Relieve anemia. Relieve swelling. Protect your bones. Improve the balance of salts and minerals in your blood  (electrolytes). Dialysis, which is a type of treatment that removes toxic waste from the body. It may be needed if you have kidney failure. Managing any other conditions that are causing your CKD or making it worse. Follow these instructions at home: Medicines Take over-the-counter and prescription medicines only as told by your health care provider. The amount of some medicines that you take may need to be changed. Do not take any new medicines unless approved by your health care provider. Many medicines can make kidney damage worse. Do not take any vitamin and mineral supplements unless approved by your health care provider. Many nutritional supplements can make kidney damage worse. Lifestyle  Do not use any products that contain nicotine or tobacco, such as cigarettes, e-cigarettes, and chewing tobacco. If you need help quitting, ask your health care provider. If you drink alcohol: Limit how much you use to: 0-1 drink a day for women who are not pregnant. 0-2 drinks a day for men. Know how much alcohol is in your drink. In the [place] one drink equals one 12 oz bottle of beer (355 mL), one 5 oz glass of wine (148 mL), or one 1 oz glass of hard liquor (44 mL). Maintain a healthy weight. If you need help, ask your health care provider. General instructions  Follow instructions from your health care provider about eating or drinking restrictions, including any prescribed diet. Track your blood pressure at home. Report changes in your blood pressure as told. If you are being treated for diabetes, track your blood glucose levels as told. Start or continue an exercise plan. Exercise at least 30 minutes a day, 5 days a week. Keep your immunizations up to date as told. Keep all follow-up visits. This is important. Where to find more information American Association of Kidney Patients: [employer].is [employer]: www.kidney.org American Kidney Fund: FightingMatch.com.ee Life Options:  www.lifeoptions.org Kidney School: www.kidneyschool.org Contact a health care provider if: Your symptoms get worse. You develop new symptoms. Get help right away if: You develop symptoms of ESRD. These include: Headaches. Numbness in your hands or feet. Easy bruising. Frequent hiccups. Chest pain. Shortness of breath. Lack of menstrual periods, in women. You have a fever. You are producing less urine than usual. You have pain or bleeding when you urinate or when you have a bowel movement. These symptoms may represent a serious problem that is an emergency. Do not wait to see if the symptoms will go away. Get medical help right away. Call your local emergency services (911 in the [place] Do not drive yourself to the hospital. Summary Chronic kidney disease (CKD) occurs when the kidneys become damaged slowly over a long period of time. The most common causes of this condition are diabetes and high blood pressure (hypertension). There is no cure for most cases of CKD, but treatment usually relieves symptoms and prevents or slows the worsening of the disease. Treatment may include a combination of lifestyle changes, medicines, and dialysis. This information is not intended to replace advice given to you by your health care provider. Make sure you discuss any questions you have with your health care provider. Document Revised: 03/11/2020 Document Reviewed: 03/16/2020 Elsevier Patient Education  2024 [employer].

## 2023-08-07 NOTE — Progress Notes (Signed)
Subjective:  Patient ID: Alexis Shelton, female    DOB: 12/25/1940  Age: 82 y.o. MRN: [id number]  CC: Hypertension   HPI [name] presents for f/up ---  Discussed the use of AI scribe software for clinical note transcription with the patient, who gave verbal consent to proceed.  History of Present Illness   The patient, with a history of lumbar back pain, recently received an injection in the lumbar region which has significantly improved their symptoms. 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  In addition to their back pain, the patient has been experiencing gynecological issues, specifically incontinence. They have been under the care of a gynecologist, Dr. Edward Jolly, who has prescribed a fiber drink that seems to be helping with the incontinence. However, the patient mentions a recent urinary tract infection that was treated and resolved. The patient also mentions that their protein levels were found to be high during a recent analysis of their urine.  The patient also reports a history of migraines, which they believe may be causing occasional dizziness and lightheadedness. They deny any cardiac symptoms and report that previous studies of their heart have shown it to be in good condition.  The patient is also dealing with emotional distress due to a complicated family situation involving their son and grandson. They are currently seeing a psychiatrist for this issue.  Lastly, the patient mentions a concern about their weight, which they feel is creeping up and affecting their mobility. They express interest in possibly starting a medication for weight loss.       Outpatient Medications Prior to Visit  Medication Sig Dispense Refill   allopurinol (ZYLOPRIM) 100 MG tablet Take 1 tablet (100 mg total) by mouth 2 (two) times daily. 180 tablet 3   ASPIRIN LOW DOSE 81 MG  tablet TAKE 1 TABLET (81 MG TOTAL) BY MOUTH DAILY. SWALLOW WHOLE. 90 tablet 3   budesonide-formoterol (SYMBICORT) 160-4.5 MCG/ACT inhaler Inhale 2 puffs into the lungs daily as needed (sob/wheezing). 30.6 g 1   levothyroxine (SYNTHROID) 88 MCG tablet Take 1 tablet (88 mcg total) by mouth daily before breakfast. 90 tablet 1   metoprolol succinate (TOPROL-XL) 25 MG 24 hr tablet Take 1 tablet (25 mg total) by mouth daily. 90 tablet 0   modafinil (PROVIGIL) 100 MG tablet Take 1 tablet (100 mg total) by mouth daily. 30 tablet 2   montelukast (SINGULAIR) 10 MG tablet TAKE 1 TABLET BY MOUTH EVERYDAY AT BEDTIME 90 tablet 1   Multiple Vitamin (MULTIVITAMIN) tablet Take 1 tablet by mouth daily. 90 tablet 1   nystatin-triamcinolone ointment (MYCOLOG) Apply 1 Application topically 2 (two) times daily. Use for one week. 30 g 0   ondansetron (ZOFRAN-ODT) 4 MG disintegrating tablet Take 1 tablet (4 mg total) by mouth every 8 (eight) hours as needed for nausea or vomiting. 12 tablet 0   rosuvastatin (CRESTOR) 10 MG tablet Take 1 tablet (10 mg total) by mouth daily. 90 tablet 1   SHINGRIX injection      tiZANidine (ZANAFLEX) 4 MG tablet TAKE 1 TABLET BY MOUTH AT BEDTIME AS NEEDED FOR MUSCLE SPASMS. 90 tablet 1   traMADol (ULTRAM) 50 MG tablet Take 1 tablet (50 mg total) by mouth every 6 (six) hours as needed. (Patient taking differently: Take 50 mg by mouth at bedtime.) 120 tablet 1   UBRELVY 100 MG TABS Take by  mouth.     Facility-Administered Medications Prior to Visit  Medication Dose Route Frequency Provider Last Rate Last Admin   methylPREDNISolone acetate (DEPO-MEDROL) injection 80 mg  80 mg Other Once         ROS Review of Systems  Constitutional:  Positive for fatigue. Negative for appetite change, chills, diaphoresis and unexpected weight change.  HENT: Negative.    Eyes: Negative.   Respiratory: Negative.  Negative for cough, chest tightness, shortness of breath and wheezing.   Cardiovascular:   Negative for chest pain, palpitations and leg swelling.  Gastrointestinal:  Negative for abdominal pain, constipation, diarrhea and nausea.  Genitourinary:  Negative for difficulty urinating.  Musculoskeletal:  Positive for arthralgias, back pain and gait problem.  Skin: Negative.   Neurological:  Positive for dizziness and light-headedness. Negative for weakness and numbness.  Hematological:  Negative for adenopathy. Does not bruise/bleed easily.  Psychiatric/Behavioral:  Positive for confusion.     Objective:  BP (!) 140/82 (BP Location: Right Arm, Patient Position: Sitting, Cuff Size: Normal)   Pulse 87   Temp 98.3 F (36.8 C) (Oral)   Ht 5' 2.65" (1.591 m)   SpO2 93%   BMI 30.99 kg/m   BP Readings from Last 3 Encounters:  08/07/23 (!) 140/82  08/05/23 (!) 184/69  07/24/23 122/82    Wt Readings from Last 3 Encounters:  07/24/23 173 lb (78.5 kg)  07/08/23 174 lb (78.9 kg)  07/02/23 174 lb (78.9 kg)    Physical Exam Vitals reviewed.  Constitutional:      Appearance: Normal appearance.  HENT:     Nose: Nose normal.     Mouth/Throat:     Mouth: Mucous membranes are moist.  Eyes:     General: No scleral icterus.    Conjunctiva/sclera: Conjunctivae normal.  Cardiovascular:     Rate and Rhythm: Normal rate and regular rhythm.     Heart sounds: No murmur heard.    No friction rub.  Pulmonary:     Effort: Pulmonary effort is normal.     Breath sounds: No stridor. No wheezing, rhonchi or rales.  Abdominal:     General: Abdomen is flat.     Palpations: There is no mass.     Tenderness: There is no abdominal tenderness. There is no guarding.     Hernia: No hernia is present.  Musculoskeletal:        General: Normal range of motion.     Cervical back: Neck supple.     Right lower leg: No edema.     Left lower leg: No edema.  Lymphadenopathy:     Cervical: No cervical adenopathy.  Skin:    General: Skin is warm and dry.  Neurological:     Mental Status: She is  alert. Mental status is at baseline.     Motor: Weakness present.     Coordination: Coordination abnormal.     Gait: Gait abnormal.     Deep Tendon Reflexes: Reflexes normal.  Psychiatric:        Mood and Affect: Mood normal.        Behavior: Behavior normal.     Lab Results  Component Value Date   WBC 6.3 06/27/2023   HGB 13.9 06/27/2023   HCT 41.0 06/27/2023   PLT 206 06/27/2023   GLUCOSE 99 06/27/2023   CHOL 194 08/07/2023   TRIG 125.0 08/07/2023   HDL 51.40 08/07/2023   LDLDIRECT 175.0 06/06/2022   LDLCALC 117 (H) 08/07/2023   ALT 28  06/27/2023   AST 32 06/27/2023   NA 139 06/27/2023   K 4.8 06/27/2023   CL 108 06/27/2023   CREATININE 1.30 (H) 06/27/2023   BUN 27 (H) 06/27/2023   CO2 24 06/27/2023   TSH 1.57 08/07/2023   INR 1.1 12/08/2021   HGBA1C 6.0 08/07/2023   MICROALBUR 85.6 (H) 08/07/2023    No results found.  Assessment & Plan:   Idiopathic chronic gout of multiple sites without tophus -     Uric acid; Future  Essential hypertension- Her BP is well controlled. -     Urinalysis, Routine w reflex microscopic; Future  Chronic renal disease, stage 4, severely decreased glomerular filtration rate (GFR) between 15-29 mL/min/1.73 square meter (HCC) -     Urinalysis, Routine w reflex microscopic; Future -     Microalbumin / creatinine urine ratio; Future  Isolated proteinuria with morphologic lesion -     Urinalysis, Routine w reflex microscopic; Future -     Microalbumin / creatinine urine ratio; Future  Hypothyroidism, unspecified type- She is euthyroid. -     TSH; Future  Prediabetes -     Hemoglobin A1c; Future  Hyperlipidemia LDL goal <100 -     Lipid panel; Future     Follow-up: Return in about 3 months (around 11/07/2023).  Sanda Linger, MD

## 2023-08-09 ENCOUNTER — Encounter: Payer: Self-pay | Admitting: Internal Medicine

## 2023-08-09 ENCOUNTER — Other Ambulatory Visit: Payer: Self-pay | Admitting: Internal Medicine

## 2023-08-09 DIAGNOSIS — N184 Chronic kidney disease, stage 4 (severe): Secondary | ICD-10-CM

## 2023-08-09 DIAGNOSIS — N069 Isolated proteinuria with unspecified morphologic lesion: Secondary | ICD-10-CM

## 2023-08-09 NOTE — Telephone Encounter (Signed)
Pt called to follow up on this message. Pt would like to have a referral sent to Dr. Verna Czech to treat her kidney concerns.

## 2023-08-14 NOTE — Progress Notes (Signed)
Alexis Shelton - 82 y.o. female MRN [id number]  Date of birth: 10-Apr-1941  Office Visit Note: Visit Date: 08/05/2023 PCP: Etta Grandchild, MD Referred by: Etta Grandchild, MD  Subjective: Chief Complaint  Patient presents with   Lower Back - Pain   HPI:  Alexis Shelton is a 82 y.o. female who comes in today at the request of Dr. Doneen Poisson for planned Right L4-5 Lumbar Interlaminar epidural steroid injection with fluoroscopic guidance.  The patient has failed conservative care including home exercise, medications, time and activity modification.  This injection will be diagnostic and hopefully therapeutic.  Please see requesting physician notes for further details and justification.    ROS Otherwise per HPI.  Assessment & Plan: Visit Diagnoses:    ICD-10-CM   1. Lumbar radiculopathy  M54.16 XR C-ARM NO REPORT    Epidural Steroid injection    methylPREDNISolone acetate (DEPO-MEDROL) injection 80 mg      Plan: No additional findings.   Meds & Orders:  Meds ordered this encounter  Medications   methylPREDNISolone acetate (DEPO-MEDROL) injection 80 mg    Orders Placed This Encounter  Procedures   XR C-ARM NO REPORT   Epidural Steroid injection    Follow-up: Return if symptoms worsen or fail to improve.   Procedures: No procedures performed  Lumbar Epidural Steroid Injection - Interlaminar Approach with Fluoroscopic Guidance  Patient: Alexis Shelton      Date of Birth: [birth year] MRN: [id number] PCP: Etta Grandchild, MD      Visit Date: 08/05/2023   Universal Protocol:     Consent Given By: the patient  Position: PRONE  Additional Comments: Vital signs were monitored before and after the procedure. Patient was prepped and draped in the usual sterile fashion. The correct patient, procedure, and site was verified.   Injection Procedure Details:   Procedure diagnoses: Lumbar radiculopathy [M54.16]   Meds Administered:  Meds ordered  this encounter  Medications   methylPREDNISolone acetate (DEPO-MEDROL) injection 80 mg     Laterality: Right  Location/Site:  L4-5  Needle: 3.5 in., 20 ga. Tuohy  Needle Placement: Paramedian epidural  Findings:   -Comments: Excellent flow of contrast into the epidural space.  Procedure Details: Using a paramedian approach from the side mentioned above, the region overlying the inferior lamina was localized under fluoroscopic visualization and the soft tissues overlying this structure were infiltrated with 4 ml. of 1% Lidocaine without Epinephrine. The Tuohy needle was inserted into the epidural space using a paramedian approach.   The epidural space was localized using loss of resistance along with counter oblique bi-planar fluoroscopic views.  After negative aspirate for air, blood, and CSF, a 2 ml. volume of Isovue-250 was injected into the epidural space and the flow of contrast was observed. Radiographs were obtained for documentation purposes.    The injectate was administered into the level noted above.   Additional Comments:  No complications occurred Dressing: 2 x 2 sterile gauze and Band-Aid    Post-procedure details: Patient was observed during the procedure. Post-procedure instructions were reviewed.  Patient left the clinic in stable condition.    Clinical History: MRI LUMBAR SPINE WITHOUT CONTRAST   TECHNIQUE: Multiplanar, multisequence MR imaging of the lumbar spine was performed. No intravenous contrast was administered.   COMPARISON:  10/30/2018   FINDINGS: Segmentation:  Standard.   Alignment:  Physiologic.   Vertebrae: No fracture, evidence of discitis, or bone lesion. Chronic wedging of the T12 vertebral body, stable.  Conus medullaris and cauda equina: Conus extends to the L1 level. Conus and cauda equina appear normal.   Paraspinal and other soft tissues: Negative for perispinal mass or inflammation. Atrophy of intrinsic back muscles.    Disc levels:   T12- L1: Disc collapse and endplate/facet spurring. No neural impingement.   L1-L2: Disc collapse with endplate and facet spurring. The canal and foramina are patent   L2-L3: Disc narrowing and bulging with endplate and facet spurring. The canal and foramina are patent   L3-L4: Disc narrowing and bulging. Moderate degenerative facet spurring on both sides. Epidural fat causes thecal sac narrowing without nerve root compression.   L4-L5: Disc collapse and endplate degeneration with endplate and facet spurring eccentric to the right. Epidural fat causes mild thecal sac narrowing in addition to degenerative change. Right foraminal impingement   L5-S1:Disc collapse with endplate and facet spurring on both sides. Left more than right foraminal impingement and L5 root deformity. Patent spinal canal   IMPRESSION: 1. 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 2. Foraminal impingement on the right at L4-5 and bilaterally at L5-S1. 3. Diffusely patent spinal canal.     Electronically Signed   By: Tiburcio Pea M.D.   On: 05/19/2023 05:26     Objective:  VS:  HT:    WT:   BMI:     BP:(!) 184/69  HR:60bpm  TEMP: ( )  RESP:  Physical Exam Vitals and nursing note reviewed.  Constitutional:      General: She is not in acute distress.    Appearance: Normal appearance. She is not ill-appearing.  HENT:     Head: Normocephalic and atraumatic.     Right Ear: External ear normal.     Left Ear: External ear normal.  Eyes:     Extraocular Movements: Extraocular movements intact.  Cardiovascular:     Rate and Rhythm: Normal rate.     Pulses: Normal pulses.  Pulmonary:     Effort: Pulmonary effort is normal. No respiratory distress.  Abdominal:     General: There is no distension.     Palpations: Abdomen is soft.  Musculoskeletal:        General: Tenderness present.     Cervical back: Neck supple.     Right  lower leg: No edema.     Left lower leg: No edema.     Comments: Patient has good distal strength with no pain over the greater trochanters.  No clonus or focal weakness.  Skin:    Findings: No erythema, lesion or rash.  Neurological:     General: No focal deficit present.     Mental Status: She is alert and oriented to person, place, and time.     Sensory: No sensory deficit.     Motor: No weakness or abnormal muscle tone.     Coordination: Coordination normal.  Psychiatric:        Mood and Affect: Mood normal.        Behavior: Behavior normal.      Imaging: No results found.

## 2023-08-14 NOTE — Procedures (Signed)
Lumbar Epidural Steroid Injection - Interlaminar Approach with Fluoroscopic Guidance  Patient: Alexis Shelton      Date of Birth: 1941/04/04 MRN: [id number] PCP: Etta Grandchild, MD      Visit Date: 08/05/2023   Universal Protocol:     Consent Given By: the patient  Position: PRONE  Additional Comments: Vital signs were monitored before and after the procedure. Patient was prepped and draped in the usual sterile fashion. The correct patient, procedure, and site was verified.   Injection Procedure Details:   Procedure diagnoses: Lumbar radiculopathy [M54.16]   Meds Administered:  Meds ordered this encounter  Medications   methylPREDNISolone acetate (DEPO-MEDROL) injection 80 mg     Laterality: Right  Location/Site:  L4-5  Needle: 3.5 in., 20 ga. Tuohy  Needle Placement: Paramedian epidural  Findings:   -Comments: Excellent flow of contrast into the epidural space.  Procedure Details: Using a paramedian approach from the side mentioned above, the region overlying the inferior lamina was localized under fluoroscopic visualization and the soft tissues overlying this structure were infiltrated with 4 ml. of 1% Lidocaine without Epinephrine. The Tuohy needle was inserted into the epidural space using a paramedian approach.   The epidural space was localized using loss of resistance along with counter oblique bi-planar fluoroscopic views.  After negative aspirate for air, blood, and CSF, a 2 ml. volume of Isovue-250 was injected into the epidural space and the flow of contrast was observed. Radiographs were obtained for documentation purposes.    The injectate was administered into the level noted above.   Additional Comments:  No complications occurred Dressing: 2 x 2 sterile gauze and Band-Aid    Post-procedure details: Patient was observed during the procedure. Post-procedure instructions were reviewed.  Patient left the clinic in stable condition.

## 2023-08-27 ENCOUNTER — Ambulatory Visit (INDEPENDENT_AMBULATORY_CARE_PROVIDER_SITE_OTHER): Payer: Medicare HMO | Admitting: Psychology

## 2023-08-27 DIAGNOSIS — F3289 Other specified depressive episodes: Secondary | ICD-10-CM | POA: Diagnosis not present

## 2023-08-27 NOTE — Progress Notes (Signed)
[facility] Counselor/Therapist Progress Note  Patient ID: Alexis Shelton, MRN: [id number]    Date: 08/27/23  Time Spent: 2:18  pm - 3:00 pm : 42  Minutes  Treatment Type: Individual Therapy.  Reported Symptoms: anxiety and depression.   Mental Status Exam: Appearance:  Neat and Well Groomed     Behavior: Appropriate  Motor: Normal  Speech/Language:  Clear and Coherent  Affect: Congruent  Mood: anxious and dysthymic  Thought process: normal  Thought content:   WNL  Sensory/Perceptual disturbances:   WNL  Orientation: oriented to person, place, time/date, and situation  Attention: Good  Concentration: Good  Memory: WNL  Fund of knowledge:  Good  Insight:   Good  Judgment:  Good  Impulse Control: Good   Risk Assessment: Danger to Self:  No Self-injurious Behavior: No Danger to Others: No Duty to Warn:no Physical Aggression / Violence:No  Access to Firearms a concern: No  Gang Involvement:No   Subjective:   Alexis Shelton participated in the session, in person in the office with the therapist, and consented to treatment Alexis Shelton reviewed the events of the past week. Alexis Shelton noted celebrating her 82th birthday. She noted being contacted by many family members and friends. She noted being surprised by contact by one of her sisters who is estranged from her. She noted asking why her sister does not make contact and noted her sister refusing to answer. She noted feeling vulnerable due to the lack of contact with her grandson. She noted the stressors of world politics and having to avoid certain conversations with friends who have different perspectives. She noted feeling a lack of control or influence on her relationship with her son, and in turn, her grandson. We worked on exploring and processing this during the session. She provided additional family history and dynamics. Therapist validated Alexis Shelton's feelings and experience and provided supportive therapy. A  follow-up was scheduled for continued treatment.   Interventions: CBT and interpersonal   Diagnosis:   Other depression  Psychiatric Treatment: No   Treatment Plan:  Client Abilities/Strengths Alexis Shelton is verbose and motivated for change.   Support System: Friend: Alexis Shelton  Client Treatment Preferences Outpatient therapy.  Client Statement of Needs Alexis Shelton would like to process relationships with son and sister, managing her overall symptoms, manage overall stressors, including financials, and process past events.  Additionally, processing aging, feeling lonely, and financial stressors.   Treatment Level Weekly  Symptoms  Depression: loss of interest, feeling down, difficulty sleeping (middle insomnia) ,lethargy, fluctuating appetite (poor appetite), , trouble concentration, psychomotor retardation, and denied history of SI   (Status: maintained) Anxiety: Feeling nervous, difficulty managing worry, worrying about different things, trouble relaxing, difficulty sitting still, easily irritable, feeling afraid something awful might happen.    (Status: maintained)  Goals:   Alexis Shelton experiences symptoms of depression and anxiety.    Target Date: 12/05/23 Frequency: Weekly  Progress: 0 Modality: individual    Therapist will provide referrals for additional resources as appropriate.  Therapist will provide psycho-education regarding Alexis Shelton's diagnosis and corresponding treatment approaches and interventions. Licensed Clinical Social Worker, [place], LCSW will support the patient's ability to achieve the goals identified. will employ CBT, BA, Problem-solving, Solution Focused, Mindfulness,  coping skills, & other evidenced-based practices will be used to promote progress towards healthy functioning to help manage decrease symptoms associated with her diagnosis.   Reduce overall level, frequency, and intensity of the feelings of depression and anxiety evidenced by decreased overall symptoms  from 6 to  7 days/week to 0 to 1 days/week per client report for at least 3 consecutive months. Verbally express understanding of the relationship between feelings of depression, anxiety and their impact on thinking patterns and behaviors. Verbalize an understanding of the role that distorted thinking plays in creating fears, excessive worry, and ruminations.  Alexis Shelton participated in the creation of the treatment plan)    Delight Ovens, LCSW

## 2023-09-09 ENCOUNTER — Telehealth: Payer: Self-pay | Admitting: Internal Medicine

## 2023-09-09 ENCOUNTER — Other Ambulatory Visit: Payer: Self-pay | Admitting: Internal Medicine

## 2023-09-09 DIAGNOSIS — R400 Somnolence: Secondary | ICD-10-CM

## 2023-09-09 NOTE — Telephone Encounter (Signed)
Patient used to be on [place] and said she got off of it because she wasn't having as many migraines. She said she has had several bad ones recently and wanted to know if she could be re-started on it.  Patient would also like to know if she can get orders put in for a mammogram and a bone density test.   Patient would like a call back at [phone].

## 2023-09-12 ENCOUNTER — Other Ambulatory Visit: Payer: Self-pay | Admitting: Internal Medicine

## 2023-09-12 DIAGNOSIS — Z1231 Encounter for screening mammogram for malignant neoplasm of breast: Secondary | ICD-10-CM

## 2023-09-24 ENCOUNTER — Ambulatory Visit: Payer: Medicare HMO | Admitting: Psychology

## 2023-09-25 ENCOUNTER — Ambulatory Visit
Admission: RE | Admit: 2023-09-25 | Discharge: 2023-09-25 | Disposition: A | Discharge status: Home / Self Care | Payer: Medicare HMO | Source: Ambulatory Visit

## 2023-09-25 DIAGNOSIS — Z1231 Encounter for screening mammogram for malignant neoplasm of breast: Secondary | ICD-10-CM

## 2023-09-26 DIAGNOSIS — N1832 Chronic kidney disease, stage 3b: Secondary | ICD-10-CM | POA: Diagnosis not present

## 2023-10-01 DIAGNOSIS — N1832 Chronic kidney disease, stage 3b: Secondary | ICD-10-CM | POA: Diagnosis not present

## 2023-10-01 DIAGNOSIS — M1990 Unspecified osteoarthritis, unspecified site: Secondary | ICD-10-CM | POA: Diagnosis not present

## 2023-10-01 DIAGNOSIS — I129 Hypertensive chronic kidney disease with stage 1 through stage 4 chronic kidney disease, or unspecified chronic kidney disease: Secondary | ICD-10-CM | POA: Diagnosis not present

## 2023-10-08 ENCOUNTER — Encounter: Payer: Medicare HMO | Admitting: Obstetrics and Gynecology

## 2023-10-10 ENCOUNTER — Other Ambulatory Visit: Payer: Self-pay | Admitting: Internal Medicine

## 2023-10-10 ENCOUNTER — Other Ambulatory Visit: Payer: Self-pay | Admitting: Obstetrics and Gynecology

## 2023-10-10 NOTE — Telephone Encounter (Signed)
Med refill request: nystatin-triamcinolone ointment 30g Last AEX: 07/24/23 Next AEX: none scheduled  Last MMG (if hormonal med) n/a Refill authorized: nystatin-triamcinolone ointment 30g.  Sent to provider for review.

## 2023-10-23 ENCOUNTER — Other Ambulatory Visit: Payer: Self-pay | Admitting: Internal Medicine

## 2023-10-24 ENCOUNTER — Telehealth: Payer: Self-pay | Admitting: Internal Medicine

## 2023-10-24 ENCOUNTER — Other Ambulatory Visit: Payer: Self-pay

## 2023-10-24 MED ORDER — MONTELUKAST SODIUM 10 MG PO TABS: ORAL_TABLET | ORAL | 0 refills | Status: DC

## 2023-10-24 NOTE — Telephone Encounter (Signed)
Medication refill sent to Dr. Yetta Barre

## 2023-10-24 NOTE — Telephone Encounter (Signed)
Pt called stating that she lost her medication and she can't find her medicine and wanting to know if Dr. Yetta Barre can prescribe her another Rx.   montelukast (SINGULAIR) 10 MG tablet

## 2023-11-04 ENCOUNTER — Ambulatory Visit: Payer: Medicare HMO | Admitting: Psychology

## 2023-11-04 DIAGNOSIS — F3289 Other specified depressive episodes: Secondary | ICD-10-CM | POA: Diagnosis not present

## 2023-11-04 NOTE — Progress Notes (Unsigned)
[facility] Counselor/Therapist Progress Note  Patient ID: Alexis Shelton, MRN: [id number]    Date: 11/04/23  Time Spent: 3:30  pm - 4:32 pm :62 Minutes  Treatment Type: Individual Therapy.  Reported Symptoms: anxiety and depression.   Mental Status Exam: Appearance:  Neat and Well Groomed     Behavior: Appropriate  Motor: Normal  Speech/Language:  Clear and Coherent  Affect: Congruent  Mood: anxious and dysthymic  Thought process: normal  Thought content:   WNL  Sensory/Perceptual disturbances:   WNL  Orientation: oriented to person, place, time/date, and situation  Attention: Good  Concentration: Good  Memory: WNL  Fund of knowledge:  Good  Insight:   Good  Judgment:  Good  Impulse Control: Good   Risk Assessment: Danger to Self:  No Self-injurious Behavior: No Danger to Others: No Duty to Warn:no Physical Aggression / Violence:No  Access to Firearms a concern: No  Gang Involvement:No   Subjective:   Alexis Shelton participated in the session, in person in the office with the therapist, and consented to treatment Alexis Shelton reviewed the events of the past week. She noted significant financial stressors including the abrupt closing of her bank account. She noted that this has been a difficult stressors to traverse. She noted confusion regarding why she experienced this issue. She noted contacting a lawyer to help advise her regarding this issue. She noted a decline in her ability to ambulate and is considering the use of a wheel chair. She noted continued estrangement with her son, by his choice, without a discussion or reason for this estrangement. She noted the effect of this on her mood and ability to communicate with her grandson, as a result. She noted being hopeful to reconnect with her grandson who she has not seen in a long time. We worked on processing her feelings regarding these relationship dynamics. She noted being more social and noted this  having a positive effect on her mood, overall. Therapist validated Alexis Shelton's feelings and experience during the session. Therapist praised Alexis Shelton for being proactive with addressing her concerns. Therapist validated Alexis Shelton feelings and experience and provided supportive therapy. A follow-up was scheduled for continued treatment.  Interventions: CBT and interpersonal   Diagnosis:   Other depression  Psychiatric Treatment: No   Treatment Plan:  Client Abilities/Strengths Alexis Shelton is verbose and motivated for change.   Support System: Friend: Alexis Shelton  Client Treatment Preferences Outpatient therapy.  Client Statement of Needs Alexis Shelton would like to process relationships with son and sister, managing her overall symptoms, manage overall stressors, including financials, and process past events.  Additionally, processing aging, feeling lonely, and financial stressors.   Treatment Level Weekly  Symptoms  Depression: loss of interest, feeling down, difficulty sleeping (middle insomnia) ,lethargy, fluctuating appetite (poor appetite), , trouble concentration, psychomotor retardation, and denied history of SI   (Status: maintained) Anxiety: Feeling nervous, difficulty managing worry, worrying about different things, trouble relaxing, difficulty sitting still, easily irritable, feeling afraid something awful might happen.    (Status: maintained)  Goals:   Alexis Shelton experiences symptoms of depression and anxiety.    Target Date: 12/05/23 Frequency: Weekly  Progress: 0 Modality: individual    Therapist will provide referrals for additional resources as appropriate.  Therapist will provide psycho-education regarding Alexis Shelton's diagnosis and corresponding treatment approaches and interventions. Licensed Clinical Social Worker, [place], LCSW will support the patient's ability to achieve the goals identified. will employ CBT, BA, Problem-solving, Solution Focused, Mindfulness,  coping skills, & other  evidenced-based  practices will be used to promote progress towards healthy functioning to help manage decrease symptoms associated with her diagnosis.   Reduce overall level, frequency, and intensity of the feelings of depression and anxiety evidenced by decreased overall symptoms from 6 to 7 days/week to 0 to 1 days/week per client report for at least 3 consecutive months. Verbally express understanding of the relationship between feelings of depression, anxiety and their impact on thinking patterns and behaviors. Verbalize an understanding of the role that distorted thinking plays in creating fears, excessive worry, and ruminations.  Alexis Shelton participated in the creation of the treatment plan)    Delight Ovens, LCSW                Delight Ovens, LCSW

## 2023-11-06 ENCOUNTER — Other Ambulatory Visit: Payer: Self-pay | Admitting: Internal Medicine

## 2023-11-20 NOTE — Progress Notes (Unsigned)
Office Visit Note  Patient: Alexis Shelton             Date of Birth: [birth year]           MRN: [id number]             PCP: Etta Grandchild, MD Referring: Etta Grandchild, MD Visit Date: 12/03/2023 Occupation: @GUAROCC @  Subjective:  No chief complaint on file.   History of Present Illness: Alexis Shelton is a 82 y.o. female ***     Activities of Daily Living:  Patient reports morning stiffness for *** {minute/hour:19697}.   Patient {ACTIONS;DENIES/REPORTS:21021675::"Denies"} nocturnal pain.  Difficulty dressing/grooming: {ACTIONS;DENIES/REPORTS:21021675::"Denies"} Difficulty climbing stairs: {ACTIONS;DENIES/REPORTS:21021675::"Denies"} Difficulty getting out of chair: {ACTIONS;DENIES/REPORTS:21021675::"Denies"} Difficulty using hands for taps, buttons, cutlery, and/or writing: {ACTIONS;DENIES/REPORTS:21021675::"Denies"}  No Rheumatology ROS completed.   PMFS History:  Patient Active Problem List   Diagnosis Date Noted   Chronic low back pain 07/03/2023   Uncontrolled daytime somnolence 05/08/2023   Primary osteoarthritis of both knees 04/29/2023   Chronic renal disease, stage 4, severely decreased glomerular filtration rate (GFR) between 15-29 mL/min/1.73 square meter (HCC) 04/23/2023   Episodic migraine 01/10/2023   Hyperlipidemia LDL goal <100 06/06/2022   Need for prophylactic vaccination and inoculation against varicella 06/06/2022   Prediabetes 10/09/2021   Primary cerebellar degeneration (HCC) 09/21/2021   Obesity 08/19/2020   Proteinuria 08/19/2020   Vitamin D deficiency 02/11/2019   Mild intermittent asthma without complication 02/11/2019   Primary osteoarthritis of both hands 11/24/2018   Primary osteoarthritis of both feet 11/24/2018   DDD (degenerative disc disease), cervical 11/24/2018   DDD (degenerative disc disease), lumbar 11/24/2018   Gait abnormality 10/13/2018   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   Asthma 08/12/2018    Hypothyroidism 08/12/2018   Essential hypertension 07/04/2018   Seasonal allergic rhinitis 05/28/2017   Gouty arthropathy 09/15/2010   Osteoarthrosis, generalized, involving multiple sites 09/15/2010    Past Medical History:  Diagnosis Date   Arthritis    DDD (degenerative disc disease), lumbar 11/24/2018   Dizziness    Dyspnea    Elevated liver function tests    Gallstones    Hearing loss    History of stroke involving cerebellum 10/13/2018   Hypercholesteremia    Hypertension    Hypothyroidism    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   Migraines    Mild intermittent asthma without complication 02/11/2019   Primary osteoarthritis of both feet 11/24/2018   Proteinuria    Rheumatoid arthritis (HCC) 07/04/2018   Stage 3 chronic kidney disease (HCC) 02/11/2019   Vitamin D deficiency 02/11/2019    Family History  Problem Relation Age of Onset   Stroke Mother    Hyperlipidemia Mother    Arthritis Mother    Heart disease Father    Arthritis Sister    Heart disease Sister    Heart disease Paternal Uncle        x 7   Gallbladder disease Maternal Grandmother        radiation   GER disease Maternal Grandmother    Bipolar disorder Son    Healthy Son    Colon cancer Neg Hx    Esophageal cancer Neg Hx    Rectal cancer Neg Hx    Stomach cancer Neg Hx    Cancer Neg Hx    Past Surgical History:  Procedure Laterality Date   ABDOMINAL HYSTERECTOMY     BREAST REDUCTION SURGERY Bilateral    CATARACT EXTRACTION, BILATERAL  CHOLECYSTECTOMY N/A 12/11/2021   Procedure: LAPAROSCOPIC CHOLECYSTECTOMY;  Surgeon: Almond Lint, MD;  Location: [facility];  Service: General;  Laterality: N/A;   ENDOSCOPIC RETROGRADE CHOLANGIOPANCREATOGRAPHY (ERCP) WITH PROPOFOL N/A 12/09/2021   Procedure: ENDOSCOPIC RETROGRADE CHOLANGIOPANCREATOGRAPHY (ERCP) WITH PROPOFOL;  Surgeon: Rachael Fee, MD;  Location: [facility];  Service: Endoscopy;  Laterality: N/A;   INCISION AND DRAINAGE BREAST  ABSCESS Left    REDUCTION MAMMAPLASTY     REMOVAL OF STONES  12/09/2021   Procedure: REMOVAL OF STONES;  Surgeon: Rachael Fee, MD;  Location: [facility];  Service: Endoscopy;;   REPLACEMENT TOTAL KNEE Bilateral    SPHINCTEROTOMY  12/09/2021   Procedure: SPHINCTEROTOMY;  Surgeon: Rachael Fee, MD;  Location: [facility];  Service: Endoscopy;;   TONSILLECTOMY AND ADENOIDECTOMY     TOOTH EXTRACTION  2023   x2   Social History   Social History Narrative   Lives alone.  The patient is widowed.   She worked at the [employer] in [place], MD scientist, and then moved to [place] and moved to [place] where her son is a International aid/development worker, after her husband died   Right-handed.   1 cup coffee per day.   No alcohol   1 son as above   Former smoker   Immunization History  Administered Date(s) Administered   Fluad Quad(high Dose 65+) 10/09/2021, 10/02/2022   Influenza, High Dose Seasonal PF 09/20/2018, 09/11/2019, 12/08/2019   Influenza-Unspecified 11/20/2020   Moderna Covid-19 Fall Seasonal Vaccine 19yrs & older 09/10/2023   Moderna Sars-Covid-2 Vaccination 01/08/2020, 02/04/2020   PFIZER(Purple Top)SARS-COV-2 Vaccination 11/20/2020   PNEUMOCOCCAL CONJUGATE-20 02/21/2022   Td 10/09/2021   Zoster Recombinant(Shingrix) 06/20/2023     Objective: Vital Signs: There were no vitals taken for this visit.   Physical Exam   Musculoskeletal Exam: ***  CDAI Exam: CDAI Score: -- Patient Global: --; Provider Global: -- Swollen: --; Tender: -- Joint Exam 12/03/2023   No joint exam has been documented for this visit   There is currently no information documented on the homunculus. Go to the Rheumatology activity and complete the homunculus joint exam.  Investigation: No additional findings.  Imaging: No results found.  Recent Labs: Lab Results  Component Value Date   WBC 6.3 06/27/2023   HGB 13.9 06/27/2023   PLT 206 06/27/2023   NA 139 06/27/2023   K 4.8 06/27/2023   CL  108 06/27/2023   CO2 24 06/27/2023   GLUCOSE 99 06/27/2023   BUN 27 (H) 06/27/2023   CREATININE 1.30 (H) 06/27/2023   BILITOT 0.6 06/27/2023   ALKPHOS 67 06/27/2023   AST 32 06/27/2023   ALT 28 06/27/2023   PROT 6.6 06/27/2023   ALBUMIN 3.4 (L) 06/27/2023   CALCIUM 8.4 (L) 06/27/2023   GFRAA 41 (L) 12/08/2019    Speciality Comments: No specialty comments available.  Procedures:  No procedures performed Allergies: Patient has no known allergies.   Assessment / Plan:     Visit Diagnoses: No diagnosis found.  Orders: No orders of the defined types were placed in this encounter.  No orders of the defined types were placed in this encounter.   Face-to-face time spent with patient was *** minutes. Greater than 50% of time was spent in counseling and coordination of care.  Follow-Up Instructions: No follow-ups on file.   Ellen Henri, CMA  Note - This record has been created using Animal nutritionist.  Chart creation errors have been sought, but may not always  have been located. Such  creation errors do not reflect on  the standard of medical care.

## 2023-12-03 ENCOUNTER — Ambulatory Visit: Payer: Medicare HMO | Admitting: Rheumatology

## 2023-12-03 DIAGNOSIS — M19041 Primary osteoarthritis, right hand: Secondary | ICD-10-CM

## 2023-12-03 DIAGNOSIS — I10 Essential (primary) hypertension: Secondary | ICD-10-CM

## 2023-12-03 DIAGNOSIS — M5030 Other cervical disc degeneration, unspecified cervical region: Secondary | ICD-10-CM

## 2023-12-03 DIAGNOSIS — E785 Hyperlipidemia, unspecified: Secondary | ICD-10-CM

## 2023-12-03 DIAGNOSIS — N1832 Chronic kidney disease, stage 3b: Secondary | ICD-10-CM

## 2023-12-03 DIAGNOSIS — M47816 Spondylosis without myelopathy or radiculopathy, lumbar region: Secondary | ICD-10-CM

## 2023-12-03 DIAGNOSIS — Z8673 Personal history of transient ischemic attack (TIA), and cerebral infarction without residual deficits: Secondary | ICD-10-CM

## 2023-12-03 DIAGNOSIS — E559 Vitamin D deficiency, unspecified: Secondary | ICD-10-CM

## 2023-12-03 DIAGNOSIS — Z96653 Presence of artificial knee joint, bilateral: Secondary | ICD-10-CM

## 2023-12-03 DIAGNOSIS — Z8639 Personal history of other endocrine, nutritional and metabolic disease: Secondary | ICD-10-CM

## 2023-12-03 DIAGNOSIS — M1A09X0 Idiopathic chronic gout, multiple sites, without tophus (tophi): Secondary | ICD-10-CM

## 2023-12-03 DIAGNOSIS — M19071 Primary osteoarthritis, right ankle and foot: Secondary | ICD-10-CM

## 2023-12-03 DIAGNOSIS — Z5181 Encounter for therapeutic drug level monitoring: Secondary | ICD-10-CM

## 2023-12-10 ENCOUNTER — Ambulatory Visit: Payer: Medicare HMO | Admitting: Psychology

## 2023-12-10 DIAGNOSIS — F3289 Other specified depressive episodes: Secondary | ICD-10-CM | POA: Diagnosis not present

## 2023-12-10 NOTE — Progress Notes (Signed)
[facility] Counselor/Therapist Progress Note  Patient ID: Alexis Shelton, MRN: [id number]    Date: 12/10/23  Time Spent: 3:06 pm - 3:58 pm :52 Minutes  Treatment Type: Individual Therapy.  Reported Symptoms: anxiety and depression.   Mental Status Exam: Appearance:  Neat and Well Groomed     Behavior: Appropriate  Motor: Normal  Speech/Language:  Clear and Coherent  Affect: Congruent  Mood: anxious and dysthymic  Thought process: normal  Thought content:   WNL  Sensory/Perceptual disturbances:   WNL  Orientation: oriented to person, place, time/date, and situation  Attention: Good  Concentration: Good  Memory: WNL  Fund of knowledge:  Good  Insight:   Good  Judgment:  Good  Impulse Control: Good   Risk Assessment: Danger to Self:  No Self-injurious Behavior: No Danger to Others: No Duty to Warn:no Physical Aggression / Violence:No  Access to Firearms a concern: No  Gang Involvement:No   Subjective:   Alexis Shelton participated in the session, in person in the office with the therapist, and consented to treatment Alexis Shelton reviewed the events of the past week. Alexis Shelton noted the sudden passing of her sister on Dec 1st due to accidental drowning. She noted having a strained relationship with her sister and noted her sister "used drugs a lot". She noted that her sister was not speaking with her but was unsure as to the reason. She noted her family was unclear regarding this estrangement, as well. She discussed possible contributing factors towards their estrangement but could not identify any specific reason with certainty. We worked on exploring the loss. Therapist challenged Alexis Shelton's jumps to conclusions regarding her sister's motivation or reasons for estrangement. She noted having to delay her move to new apartment due to the loss of her sister and neglecting to give appropriate notice for her current apartment. She noted reconciling this and having concrete  plans to move in July. She noted some holiday plans with a friend and noted looking forward to this. She did note a feeling of emptiness and noted working on healthy distractions. Therapist praised Alexis Shelton for her effort during the session and discussed self-care. Therapist validated Alexis Shelton's feelings and experience and provided supportive therapy. A follow-up was scheduled for continued treatment.   Interventions: CBT and interpersonal   Diagnosis:   Other depression  Psychiatric Treatment: No   Treatment Plan:  Client Abilities/Strengths Alexis Shelton is verbose and motivated for change.   Support System: Friend: Alexis Shelton  Client Treatment Preferences Outpatient therapy.  Client Statement of Needs Alexis Shelton would like to process relationships with son and sister, managing her overall symptoms, manage overall stressors, including financials, and process past events.  Additionally, processing aging, feeling lonely, and financial stressors.   Treatment Level Weekly  Symptoms  Depression: loss of interest, feeling down, difficulty sleeping (middle insomnia) ,lethargy, fluctuating appetite (poor appetite), , trouble concentration, psychomotor retardation, and denied history of SI   (Status: maintained) Anxiety: Feeling nervous, difficulty managing worry, worrying about different things, trouble relaxing, difficulty sitting still, easily irritable, feeling afraid something awful might happen.    (Status: maintained)  Goals:   Alexis Shelton experiences symptoms of depression and anxiety.    Target Date: 02/05/24 Frequency: Weekly  Progress: 0 Modality: individual    Therapist will provide referrals for additional resources as appropriate.  Therapist will provide psycho-education regarding Alexis Shelton's diagnosis and corresponding treatment approaches and interventions. Licensed Clinical Social Worker, [place], LCSW will support the patient's ability to achieve the goals identified. will employ CBT,  BA,  Problem-solving, Solution Focused, Mindfulness,  coping skills, & other evidenced-based practices will be used to promote progress towards healthy functioning to help manage decrease symptoms associated with her diagnosis.   Reduce overall level, frequency, and intensity of the feelings of depression and anxiety evidenced by decreased overall symptoms from 6 to 7 days/week to 0 to 1 days/week per client report for at least 3 consecutive months. Verbally express understanding of the relationship between feelings of depression, anxiety and their impact on thinking patterns and behaviors. Verbalize an understanding of the role that distorted thinking plays in creating fears, excessive worry, and ruminations.  Alexis Shelton participated in the creation of the treatment plan)    Alexis Ovens, LCSW

## 2023-12-13 ENCOUNTER — Other Ambulatory Visit: Payer: Self-pay | Admitting: Internal Medicine

## 2023-12-13 DIAGNOSIS — R400 Somnolence: Secondary | ICD-10-CM

## 2023-12-16 ENCOUNTER — Ambulatory Visit: Payer: Medicare HMO | Admitting: Internal Medicine

## 2023-12-16 ENCOUNTER — Encounter: Payer: Self-pay | Admitting: Internal Medicine

## 2023-12-16 VITALS — BP 138/86 | HR 55 | Temp 98.5°F | Resp 16 | Ht 62.0 in

## 2023-12-16 DIAGNOSIS — E039 Hypothyroidism, unspecified: Secondary | ICD-10-CM

## 2023-12-16 DIAGNOSIS — R7303 Prediabetes: Secondary | ICD-10-CM

## 2023-12-16 DIAGNOSIS — I10 Essential (primary) hypertension: Secondary | ICD-10-CM

## 2023-12-16 DIAGNOSIS — R400 Somnolence: Secondary | ICD-10-CM | POA: Diagnosis not present

## 2023-12-16 MED ORDER — OZEMPIC (0.25 OR 0.5 MG/DOSE) 2 MG/3ML ~~LOC~~ SOPN
0.2500 mg | PEN_INJECTOR | SUBCUTANEOUS | 0 refills | Status: DC
Start: 2023-12-16 — End: 2024-02-11

## 2023-12-16 MED ORDER — INSULIN PEN NEEDLE 32G X 6 MM MISC
1.0000 | 1 refills | Status: DC
Start: 2023-12-16 — End: 2024-07-23

## 2023-12-16 MED ORDER — MODAFINIL 100 MG PO TABS
100.0000 mg | ORAL_TABLET | Freq: Every day | ORAL | 2 refills | Status: AC
Start: 2023-12-16 — End: ?

## 2023-12-16 NOTE — Patient Instructions (Signed)
 Semaglutide Injection What is this medication? SEMAGLUTIDE (SEM a GLOO tide) treats type 2 diabetes. It works by increasing insulin levels in your body, which decreases your blood sugar (glucose).   It also reduces the amount of sugar released into your blood and slows down your digestion. It may also be used to lower the risk of heart attack and stroke in people with type 2 diabetes. Changes to diet and exercise are often combined with this medication. This medicine may be used for other purposes; ask your health care provider or pharmacist if you have questions. COMMON BRAND NAME(S): OZEMPIC What should I tell my care team before I take this medication? They need to know if you have any of these conditions: Endocrine tumors (MEN 2) or if someone in your family had these tumors Eye disease, vision problems History of pancreatitis Kidney disease Stomach problems Thyroid cancer or if someone in your family had thyroid cancer An unusual or allergic reaction to semaglutide, other medications, foods, dyes, or preservatives Pregnant or trying to get pregnant Breast-feeding How should I use this medication? This medication is for injection under the skin of your upper leg (thigh), stomach area, or upper arm. It is given once every week (every 7 days). You will be taught how to prepare and give this medication. Use exactly as directed. Take your medication at regular intervals. Do not take it more often than directed. If you use this medication with insulin, you should inject this medication and the insulin separately. Do not mix them together. Do not give the injections right next to each other. Change (rotate) injection sites with each injection. It is important that you put your used needles and syringes in a special sharps container. Do not put them in a trash can. If you do not have a sharps container, call your pharmacist or care team to get one. A special MedGuide will be given to you by the  pharmacist with each prescription and refill. Be sure to read this information carefully each time. This medication comes with INSTRUCTIONS FOR USE. Ask your pharmacist for directions on how to use this medication. Read the information carefully. Talk to your pharmacist or care team if you have questions. Talk to your care team about the use of this medication in children. Special care may be needed. Overdosage: If you think you have taken too much of this medicine contact a poison control center or emergency room at once. NOTE: This medicine is only for you. Do not share this medicine with others. What if I miss a dose? If you miss a dose, take it as soon as you can within 5 days after the missed dose. Then take your next dose at your regular weekly time. If it has been longer than 5 days after the missed dose, do not take the missed dose. Take the next dose at your regular time. Do not take double or extra doses. If you have questions about a missed dose, contact your care team for advice. What may interact with this medication? Other medications for diabetes Many medications may cause changes in blood sugar, these include: Alcohol containing beverages Antiviral medications for HIV or AIDS Aspirin and aspirin-like medications Certain medications for blood pressure, heart disease, irregular heart beat Chromium Diuretics Female hormones, such as estrogens or progestins, birth control pills Fenofibrate Gemfibrozil Isoniazid Lanreotide Female hormones or anabolic steroids MAOIs like Carbex, Eldepryl, Marplan, Nardil, and Parnate Medications for weight loss Medications for allergies, asthma, cold, or cough Medications  for depression, anxiety, or psychotic disturbances Niacin Nicotine NSAIDs, medications for pain and inflammation, like ibuprofen or naproxen Octreotide Pasireotide Pentamidine Phenytoin Probenecid Quinolone antibiotics such as ciprofloxacin, levofloxacin, ofloxacin Some  herbal dietary supplements Steroid medications such as prednisone or cortisone Sulfamethoxazole; trimethoprim Thyroid hormones Some medications can hide the warning symptoms of low blood sugar (hypoglycemia). You may need to monitor your blood sugar more closely if you are taking one of these medications. These include: Beta-blockers, often used for high blood pressure or heart problems (examples include atenolol, metoprolol, propranolol) Clonidine Guanethidine Reserpine This list may not describe all possible interactions. Give your health care provider a list of all the medicines, herbs, non-prescription drugs, or dietary supplements you use. Also tell them if you smoke, drink alcohol, or use illegal drugs. Some items may interact with your medicine. What should I watch for while using this medication? Visit your care team for regular checks on your progress. Drink plenty of fluids while taking this medication. Check with your care team if you get an attack of severe diarrhea, nausea, and vomiting. The loss of too much body fluid can make it dangerous for you to take this medication. A test called the HbA1C (A1C) will be monitored. This is a simple blood test. It measures your blood sugar control over the last 2 to 3 months. You will receive this test every 3 to 6 months. Learn how to check your blood sugar. Learn the symptoms of low and high blood sugar and how to manage them. Always carry a quick-source of sugar with you in case you have symptoms of low blood sugar. Examples include hard sugar candy or glucose tablets. Make sure others know that you can choke if you eat or drink when you develop serious symptoms of low blood sugar, such as seizures or unconsciousness. They must get medical help at once. Tell your care team if you have high blood sugar. You might need to change the dose of your medication. If you are sick or exercising more than usual, you might need to change the dose of your  medication. Do not skip meals. Ask your care team if you should avoid alcohol. Many nonprescription cough and cold products contain sugar or alcohol. These can affect blood sugar. Pens should never be shared. Even if the needle is changed, sharing may result in passing of viruses like hepatitis or HIV. Wear a medical ID bracelet or chain, and carry a card that describes your disease and details of your medication and dosage times. What side effects may I notice from receiving this medication? Side effects that you should report to your care team as soon as possible: Allergic reactions--skin rash, itching, hives, swelling of the face, lips, tongue, or throat Change in vision Dehydration--increased thirst, dry mouth, feeling faint or lightheaded, headache, dark yellow or brown urine Gallbladder problems--severe stomach pain, nausea, vomiting, fever Heart palpitations--rapid, pounding, or irregular heartbeat Kidney injury--decrease in the amount of urine, swelling of the ankles, hands, or feet Pancreatitis--severe stomach pain that spreads to your back or gets worse after eating or when touched, fever, nausea, vomiting Thoughts of suicide or self-harm, worsening mood, feelings of depression Thyroid cancer--new mass or lump in the neck, pain or trouble swallowing, trouble breathing, hoarseness Side effects that usually do not require medical attention (report these to your care team if they continue or are bothersome): Diarrhea Loss of appetite Nausea Upset stomach This list may not describe all possible side effects. Call your doctor for medical  advice about side effects. You may report side effects to FDA at 1-800-FDA-1088. Where should I keep my medication? Keep out of the reach of children. Store unopened pens in a refrigerator between 2 and 8 degrees C (36 and 46 degrees F). Do not freeze. Protect from light and heat. After you first use the pen, it can be stored for 56 days at room  temperature between 15 and 30 degrees C (59 and 86 degrees F) or in a refrigerator. Throw away your used pen after 56 days or after the expiration date, whichever comes first. Do not store your pen with the needle attached. If the needle is left on, medication may leak from the pen. NOTE: This sheet is a summary. It may not cover all possible information. If you have questions about this medicine, talk to your doctor, pharmacist, or health care provider.  2024 Elsevier/Gold Standard (2023-03-18 00:00:00)

## 2023-12-16 NOTE — Progress Notes (Unsigned)
Subjective:  Patient ID: Alexis Shelton, female    DOB: [birth year]  Age: 82 y.o. MRN: [id number]  CC: Medical Management of Chronic Issues (Discuss weight loss medications to help with weight lost, discuss kidney disease that is incorrect on chart. Pt states she has stage 3 not stage 4)   HPI Alexis Shelton presents for f/up -----  Discussed the use of AI scribe software for clinical note transcription with the patient, who gave verbal consent to proceed.  History of Present Illness   The patient, with a history of stage 3 kidney disease, gout, and sleep apnea, presents with a desire to lose weight due to difficulties with mobility. She expresses a preference for weight loss injections, but is concerned about the cost and potential insurance coverage. She has been trying to lose weight through dietary changes, but progress has been slow. She reports a decreased appetite and a change in taste preferences, leading to a diet primarily consisting of vegetables, pasta, grains, and fruit-based drinks. She has also been experiencing memory fluctuations, which she attributes to variations in sleep quality.  The patient also reports a persistent pain in her neck, for which she is taking an antispasmodic medication. She has been managing her sleep apnea with medication and the use of melatonin. She also takes allopurinol for gout management. She reports no current symptoms of kidney disease, such as nausea, vomiting, abdominal pain, or weight loss.   The patient is also dealing with the recent loss of her sister, which has had a significant emotional impact. Her sister's death was potentially related to the use of prescription pain medications, which has caused the patient some distress. She has another sister who lives in [place]. She is considering moving into a self-care facility called [facility].       Outpatient Medications Prior to Visit  Medication Sig Dispense Refill    allopurinol (ZYLOPRIM) 100 MG tablet Take 1 tablet (100 mg total) by mouth 2 (two) times daily. 180 tablet 3   ASPIRIN LOW DOSE 81 MG tablet TAKE 1 TABLET (81 MG TOTAL) BY MOUTH DAILY. SWALLOW WHOLE. 90 tablet 3   levothyroxine (SYNTHROID) 88 MCG tablet Take 1 tablet (88 mcg total) by mouth daily before breakfast. 90 tablet 1   metoprolol succinate (TOPROL-XL) 25 MG 24 hr tablet Take 1 tablet (25 mg total) by mouth daily. 90 tablet 0   montelukast (SINGULAIR) 10 MG tablet TAKE 1 TABLET BY MOUTH EVERYDAY AT BEDTIME 90 tablet 0   Multiple Vitamin (MULTIVITAMIN) tablet Take 1 tablet by mouth daily. 90 tablet 1   nystatin-triamcinolone ointment (MYCOLOG) Apply 1 Application topically 2 (two) times daily. Use for one week. 30 g 0   ondansetron (ZOFRAN-ODT) 4 MG disintegrating tablet Take 1 tablet (4 mg total) by mouth every 8 (eight) hours as needed for nausea or vomiting. 12 tablet 0   rosuvastatin (CRESTOR) 10 MG tablet Take 1 tablet (10 mg total) by mouth daily. 90 tablet 1   SHINGRIX injection      SYMBICORT 160-4.5 MCG/ACT inhaler INHALE 2 PUFFS INTO THE LUNGS DAILY AS NEEDED (SHORTNESS OF BREATH/WHEEZING). 30.6 each 1   tiZANidine (ZANAFLEX) 4 MG tablet TAKE 1 TABLET BY MOUTH AT BEDTIME AS NEEDED FOR MUSCLE SPASMS. 90 tablet 1   traMADol (ULTRAM) 50 MG tablet Take 1 tablet (50 mg total) by mouth every 6 (six) hours as needed. (Patient taking differently: Take 50 mg by mouth at bedtime.) 120 tablet 1   modafinil (PROVIGIL)  100 MG tablet TAKE 1 TABLET BY MOUTH EVERY DAY 30 tablet 2   No facility-administered medications prior to visit.    ROS Review of Systems  Objective:  BP 124/86 (BP Location: Right Arm, Patient Position: Sitting, Cuff Size: Normal)   Pulse (!) 55   Temp 98.5 F (36.9 C) (Oral)   Resp 16   Ht 5\' 2"  (1.575 m)   SpO2 98%   BMI 31.64 kg/m   BP Readings from Last 3 Encounters:  12/16/23 124/86  08/07/23 (!) 140/82  08/05/23 (!) 184/69    Wt Readings from Last 3  Encounters:  07/24/23 173 lb (78.5 kg)  07/08/23 174 lb (78.9 kg)  07/02/23 174 lb (78.9 kg)    Physical Exam  Lab Results  Component Value Date   WBC 6.3 06/27/2023   HGB 13.9 06/27/2023   HCT 41.0 06/27/2023   PLT 206 06/27/2023   GLUCOSE 99 06/27/2023   CHOL 194 08/07/2023   TRIG 125.0 08/07/2023   HDL 51.40 08/07/2023   LDLDIRECT 175.0 06/06/2022   LDLCALC 117 (H) 08/07/2023   ALT 28 06/27/2023   AST 32 06/27/2023   NA 139 06/27/2023   K 4.8 06/27/2023   CL 108 06/27/2023   CREATININE 1.30 (H) 06/27/2023   BUN 27 (H) 06/27/2023   CO2 24 06/27/2023   TSH 1.57 08/07/2023   INR 1.1 12/08/2021   HGBA1C 6.0 08/07/2023   MICROALBUR 85.6 (H) 08/07/2023    MM 3D SCREENING MAMMOGRAM BILATERAL BREAST Result Date: 09/27/2023 CLINICAL DATA:  Screening. EXAM: DIGITAL SCREENING BILATERAL MAMMOGRAM WITH TOMOSYNTHESIS AND CAD TECHNIQUE: Bilateral screening digital craniocaudal and mediolateral oblique mammograms were obtained. Bilateral screening digital breast tomosynthesis was performed. The images were evaluated with computer-aided detection. COMPARISON:  Previous exam(s). ACR Breast Density Category b: There are scattered areas of fibroglandular density. FINDINGS: There are no findings suspicious for malignancy. IMPRESSION: No mammographic evidence of malignancy. A result letter of this screening mammogram will be mailed directly to the patient. RECOMMENDATION: Screening mammogram in one year. (Code:SM-B-01Y) BI-RADS CATEGORY  1: Negative. Electronically Signed   By: Ted Mcalpine M.D.   On: 09/27/2023 15:04    Assessment & Plan:  Prediabetes -     Ozempic (0.25 or 0.5 MG/DOSE); Inject 0.25 mg into the skin once a week.  Dispense: 3 mL; Refill: 0 -     Insulin Pen Needle; 1 Act by Does not apply route once a week.  Dispense: 30 each; Refill: 1  Uncontrolled daytime somnolence -     Modafinil; Take 1 tablet (100 mg total) by mouth daily.  Dispense: 30 tablet; Refill: 2      Follow-up: Return in about 4 months (around 04/15/2024).  Sanda Linger, MD

## 2023-12-17 ENCOUNTER — Encounter: Payer: Self-pay | Admitting: Internal Medicine

## 2023-12-30 ENCOUNTER — Telehealth: Payer: Self-pay | Admitting: Internal Medicine

## 2023-12-30 NOTE — Telephone Encounter (Signed)
 Copied from CRM [id number]. Topic: Referral - Request for Referral >> Dec 30, 2023  1:06 PM Drema MATSU wrote: Did the patient discuss referral with their provider in the last year? No (If No - schedule appointment) (If Yes - send message)  Appointment offered? Yes  Type of order/referral and detailed reason for visit: Dermatology and patient has a bump on her back and is draining now.   Preference of office, provider, location: Does not have any recommendations  If referral order, have you been seen by this specialty before? Yes another issue Patient does not remember (If Yes, this issue or another issue? When? Where?  Can we respond through MyChart? Yes

## 2023-12-31 NOTE — Telephone Encounter (Signed)
**Note De-identified  [place] Obfuscation** Please advise 

## 2024-01-01 ENCOUNTER — Telehealth: Payer: Self-pay

## 2024-01-01 NOTE — Telephone Encounter (Signed)
 Unable to reach patient. LMTRC

## 2024-01-01 NOTE — Telephone Encounter (Signed)
 Appointment has been scheduled.

## 2024-01-01 NOTE — Telephone Encounter (Signed)
 Copied from CRM [id number]. Topic: Referral - Question >> Jan 01, 2024  4:22 PM Alexis Shelton wrote: Reason for CRM: patient called in stating that she has an abscess on her back that she would like to see a dermatologist for.She would like to know if a referral could be placed for her to a dermatologist without her being seen?

## 2024-01-05 ENCOUNTER — Other Ambulatory Visit: Payer: Self-pay | Admitting: Internal Medicine

## 2024-01-05 DIAGNOSIS — M5030 Other cervical disc degeneration, unspecified cervical region: Secondary | ICD-10-CM

## 2024-01-05 DIAGNOSIS — E785 Hyperlipidemia, unspecified: Secondary | ICD-10-CM

## 2024-01-05 DIAGNOSIS — M51369 Other intervertebral disc degeneration, lumbar region without mention of lumbar back pain or lower extremity pain: Secondary | ICD-10-CM

## 2024-01-06 ENCOUNTER — Ambulatory Visit: Payer: Medicare HMO | Admitting: Family Medicine

## 2024-01-06 NOTE — Progress Notes (Deleted)
   Acute Office Visit  Subjective:     Patient ID: Alexis Shelton, female    DOB: November 21, 1941, 83 y.o.   MRN: [id number]  No chief complaint on file.   HPI Patient is in today for ***  ROS Per HPI      Objective:    There were no vitals taken for this visit.   Physical Exam Vitals and nursing note reviewed.  Constitutional:      Appearance: Normal appearance. She is normal weight.  HENT:     Head: Normocephalic and atraumatic.     Right Ear: Tympanic membrane and ear canal normal.     Left Ear: Tympanic membrane and ear canal normal.     Nose: Nose normal.  Eyes:     Extraocular Movements: Extraocular movements intact.     Pupils: Pupils are equal, round, and reactive to light.  Cardiovascular:     Rate and Rhythm: Normal rate and regular rhythm.     Heart sounds: Normal heart sounds.  Pulmonary:     Effort: Pulmonary effort is normal.     Breath sounds: Normal breath sounds.  Musculoskeletal:        General: Normal range of motion.     Cervical back: Normal range of motion.  Neurological:     General: No focal deficit present.     Mental Status: She is alert and oriented to person, place, and time.  Psychiatric:        Mood and Affect: Mood normal.        Thought Content: Thought content normal.   No results found for any visits on 01/06/24.      Assessment & Plan:  ***  No orders of the defined types were placed in this encounter.   No follow-ups on file.  Corean Ku, FNP

## 2024-01-07 ENCOUNTER — Other Ambulatory Visit: Payer: Self-pay | Admitting: Internal Medicine

## 2024-01-07 DIAGNOSIS — E039 Hypothyroidism, unspecified: Secondary | ICD-10-CM

## 2024-01-07 DIAGNOSIS — I10 Essential (primary) hypertension: Secondary | ICD-10-CM

## 2024-01-21 ENCOUNTER — Ambulatory Visit (INDEPENDENT_AMBULATORY_CARE_PROVIDER_SITE_OTHER): Payer: Medicare HMO | Admitting: Psychology

## 2024-01-21 DIAGNOSIS — F3289 Other specified depressive episodes: Secondary | ICD-10-CM

## 2024-01-21 NOTE — Progress Notes (Signed)
[facility] Counselor/Therapist Progress Note  Patient ID: Alexis Shelton, MRN: [id number]    Date: 01/21/24  Time Spent: 3:02 pm - 4:01 pm :59 Minutes  Treatment Type: Individual Therapy.  Reported Symptoms: anxiety and depression.   Mental Status Exam: Appearance:  Neat and Well Groomed     Behavior: Appropriate  Motor: Normal  Speech/Language:  Clear and Coherent  Affect: Congruent  Mood: anxious and dysthymic  Thought process: normal  Thought content:   WNL  Sensory/Perceptual disturbances:   WNL  Orientation: oriented to person, place, time/date, and situation  Attention: Good  Concentration: Good  Memory: WNL  Fund of knowledge:  Good  Insight:   Good  Judgment:  Good  Impulse Control: Good   Risk Assessment: Danger to Self:  No Self-injurious Behavior: No Danger to Others: No Duty to Warn:no Physical Aggression / Violence:No  Access to Firearms a concern: No  Gang Involvement:No   Subjective:   Alexis Shelton participated in the session, in person in the office with the therapist, and consented to treatment Alexis Shelton reviewed the events of the past week. Alexis Shelton noted recently attending her sister's (June) funeral and noted that her sister's family were the main attendees and that her brother in-law had very few people in attendance. She noted discovering that her brother in-laws family was not aware of her sister's substance use, her spending habits on her children, and the general controlling nature. She noted the discovery that its a hoarding house. She noted that her sister slipped in the tub, hit her head, and drowned. She noted her inclination to communicate her concerns to her brother in-law's family due to their lack of awareness of his day-to-day life. She noted that her BIL is legally blind and has been largely aware of the household and financial happenings. She noted difficulty identifying if she should share with his family. We worked on  exploring this during the session. We processed the pros and cons of this during the session. She noted her worry that her sister's children are going to rely on her to "solve their problems". We worked on exploring this during the session. She noted her hope to find a part-time job to provide additional fiances. She noted seeing her grandson for the first time in years and noted this being a great experience. Therapist validated Alexis Shelton's feelings and experience. Therapist provided supportive therapy. A follow-up was scheduled for continued treatment.   Interventions: CBT and interpersonal   Diagnosis:   Other depression  Psychiatric Treatment: No   Treatment Plan:  Client Abilities/Strengths Alexis Shelton is verbose and motivated for change.   Support System: Friend: [name]  Client Treatment Preferences Outpatient therapy.  Client Statement of Needs Alexis Shelton like to process relationships with son and sister, managing her overall symptoms, manage overall stressors, including financials, and process past events.  Additionally, processing aging, feeling lonely, and financial stressors.   Treatment Level Weekly  Symptoms  Depression: loss of interest, feeling down, difficulty sleeping (middle insomnia) ,lethargy, fluctuating appetite (poor appetite), , trouble concentration, psychomotor retardation, and denied history of SI   (Status: maintained) Anxiety: Feeling nervous, difficulty managing worry, worrying about different things, trouble relaxing, difficulty sitting still, easily irritable, feeling afraid something awful might happen.    (Status: maintained)  Goals:   Alexis Shelton experiences symptoms of depression and anxiety.    Target Date: 02/05/24 Frequency: Weekly  Progress: 0 Modality: individual    Therapist will provide referrals for additional resources as appropriate.  Therapist will provide psycho-education regarding Alexis Shelton diagnosis and corresponding treatment approaches and  interventions. Licensed Clinical Social Worker, [place], Alexis Shelton will support the patient's ability to achieve the goals identified. will employ CBT, BA, Problem-solving, Solution Focused, Mindfulness,  coping skills, & other evidenced-based practices will be used to promote progress towards healthy functioning to help manage decrease symptoms associated with her diagnosis.   Reduce overall level, frequency, and intensity of the feelings of depression and anxiety evidenced by decreased overall symptoms from 6 to 7 days/week to 0 to 1 days/week per client report for at least 3 consecutive months. Verbally express understanding of the relationship between feelings of depression, anxiety and their impact on thinking patterns and behaviors. Verbalize an understanding of the role that distorted thinking plays in creating fears, excessive worry, and ruminations.  Alexis Shelton participated in the creation of the treatment plan)    Delight Ovens, Alexis Shelton

## 2024-02-03 ENCOUNTER — Telehealth: Payer: Self-pay | Admitting: Internal Medicine

## 2024-02-03 NOTE — Telephone Encounter (Signed)
 Copied from CRM [id number]. Topic: Clinical - Request for Lab/Test Order >> Feb 03, 2024  3:05 PM Almira Coaster wrote: Reason for CRM: Patient is receiving messages that she is due for a bone density exam; however, she's not able to schedule to an order not being on file.

## 2024-02-03 NOTE — Telephone Encounter (Signed)
**Note De-identified  [place] Obfuscation** Please advise 

## 2024-02-03 NOTE — Telephone Encounter (Signed)
Copied from CRM [id number]. Topic: Clinical - Medication Question >> Feb 03, 2024  3:00 PM Almira Coaster wrote: Reason for CRM: Patient updated insurance on file and [facility] informed that modafinil (PROVIGIL) 100 MG tablet & Semaglutide,0.25 or 0.5MG /DOS, (OZEMPIC, 0.25 OR 0.5 MG/DOSE,) 2 MG/3ML SOPN  needs a pre-authorization before they're able to fill the script.

## 2024-02-03 NOTE — Telephone Encounter (Signed)
 Patient isn't sure who's leaving her this message. I spoke with her and advised her she is due for her DEXA SCAN but she would like to discuss this with Dr. Rochelle Chu at her appointment on the 19th

## 2024-02-04 ENCOUNTER — Other Ambulatory Visit (HOSPITAL_COMMUNITY): Payer: Self-pay

## 2024-02-04 ENCOUNTER — Telehealth: Payer: Self-pay

## 2024-02-04 NOTE — Telephone Encounter (Signed)
Pharmacy Patient Advocate Encounter   Received notification from Pt Calls Messages that prior authorization for Ozempic  is required/requested.   Insurance verification completed.   The patient is insured through [facility] .   Per test claim: PA required; PA submitted to above mentioned insurance via CoverMyMeds Key/confirmation #/EOC [facility] Status is pending

## 2024-02-04 NOTE — Telephone Encounter (Signed)
Pharmacy Patient Advocate Encounter   Received notification from Pt Calls Messages that prior authorization for Modafinil 100mg  tab is required/requested.   Insurance verification completed.   The patient is insured through [facility] .   Per test claim: PA required; PA submitted to above mentioned insurance via CoverMyMeds Key/confirmation #/[id number] [facility] Status is pending

## 2024-02-04 NOTE — Telephone Encounter (Signed)
Is there anyway that you can do a tier exception for this medication for this patient ?

## 2024-02-04 NOTE — Telephone Encounter (Signed)
Pharmacy Patient Advocate Encounter  Received notification from [facility] that Prior Authorization for Modafinil 100mg   has been APPROVED from 02/04/24 to 12/23/24   PA #/Case ID/Reference #: [id number]

## 2024-02-04 NOTE — Telephone Encounter (Signed)
Patient has been made aware of her medication approval.

## 2024-02-04 NOTE — Telephone Encounter (Signed)
Pharmacy Patient Advocate Encounter  Received notification from [employer] that Prior Authorization for Ozempic has been DENIED.  See denial reason below. No denial letter attached in CMM. Will attach denial letter to Media tab once received.

## 2024-02-05 ENCOUNTER — Other Ambulatory Visit (HOSPITAL_COMMUNITY): Payer: Self-pay

## 2024-02-05 ENCOUNTER — Other Ambulatory Visit: Payer: Self-pay | Admitting: Internal Medicine

## 2024-02-05 DIAGNOSIS — R7303 Prediabetes: Secondary | ICD-10-CM

## 2024-02-06 ENCOUNTER — Other Ambulatory Visit: Payer: Self-pay | Admitting: Internal Medicine

## 2024-02-06 ENCOUNTER — Telehealth: Payer: Self-pay | Admitting: Internal Medicine

## 2024-02-06 DIAGNOSIS — R7303 Prediabetes: Secondary | ICD-10-CM

## 2024-02-06 NOTE — Telephone Encounter (Signed)
Patient has been made aware that I'm waiting on a response from Dr. Yetta Barre and she gave a verbal understanding. I explained to her what the pharmacist explained to me in regards to get her approved. She gave a verbal understanding to that too.

## 2024-02-06 NOTE — Telephone Encounter (Signed)
Copied from CRM [id number]. Topic: General - Other >> Feb 06, 2024 11:31 AM [place] J wrote: Reason for CRM: Patient would like a callback from Dr. Edward Qualia nurse regarding a dispute.

## 2024-02-06 NOTE — Telephone Encounter (Signed)
Are we going to do something different for this patient ?

## 2024-02-07 ENCOUNTER — Other Ambulatory Visit: Payer: Self-pay | Admitting: Internal Medicine

## 2024-02-07 ENCOUNTER — Telehealth: Payer: Self-pay

## 2024-02-07 DIAGNOSIS — I10 Essential (primary) hypertension: Secondary | ICD-10-CM

## 2024-02-07 NOTE — Telephone Encounter (Signed)
Copied from CRM [id number]. Topic: Clinical - Prescription Issue >> Feb 07, 2024  4:21 PM Fuller Mandril wrote: Reason for CRM: Patient called back states she spoke with Monia Pouch about getting medication and they states they were going to send something to the provider. PA denied for Ozempic.Patient spoke with someone about this and was advised clinic waiting on Dr Yetta Barre response for further instructions. Patient states dose is due to day and she wants to know what to do about that is it ok to miss a dose. Called [facility] - Dr Yetta Barre returns Monday.

## 2024-02-08 ENCOUNTER — Other Ambulatory Visit: Payer: Self-pay | Admitting: Internal Medicine

## 2024-02-08 DIAGNOSIS — R7303 Prediabetes: Secondary | ICD-10-CM

## 2024-02-11 ENCOUNTER — Other Ambulatory Visit: Payer: Self-pay | Admitting: Internal Medicine

## 2024-02-11 DIAGNOSIS — I10 Essential (primary) hypertension: Secondary | ICD-10-CM

## 2024-02-11 DIAGNOSIS — E039 Hypothyroidism, unspecified: Secondary | ICD-10-CM

## 2024-02-11 DIAGNOSIS — R7303 Prediabetes: Secondary | ICD-10-CM

## 2024-02-11 NOTE — Telephone Encounter (Signed)
Copied from CRM [id number]. Topic: Clinical - Medication Question >> Feb 11, 2024 12:51 PM Fredrich Romans wrote: Reason for CRM: patient called in today asking why Dr Yetta Barre haven't sent in her wegovy prescription to pharmacy.Her PA was denied by insurance so she is wondering what she should do now.

## 2024-02-12 ENCOUNTER — Other Ambulatory Visit: Payer: Self-pay | Admitting: Internal Medicine

## 2024-02-12 ENCOUNTER — Ambulatory Visit: Payer: Medicare HMO | Admitting: Internal Medicine

## 2024-02-12 ENCOUNTER — Encounter: Payer: Self-pay | Admitting: Internal Medicine

## 2024-02-12 DIAGNOSIS — R7303 Prediabetes: Secondary | ICD-10-CM

## 2024-02-12 MED ORDER — SEMAGLUTIDE-WEIGHT MANAGEMENT 0.25 MG/0.5ML ~~LOC~~ SOAJ: 0.2500 mg | SUBCUTANEOUS | 0 refills | Status: DC

## 2024-02-12 NOTE — Telephone Encounter (Signed)
The ozempic was denied. Patient wants to try wegovy if you see fit.

## 2024-02-13 ENCOUNTER — Other Ambulatory Visit: Payer: Self-pay | Admitting: Internal Medicine

## 2024-02-13 ENCOUNTER — Other Ambulatory Visit: Payer: Self-pay | Admitting: Obstetrics and Gynecology

## 2024-02-13 ENCOUNTER — Telehealth: Payer: Self-pay

## 2024-02-13 DIAGNOSIS — R7303 Prediabetes: Secondary | ICD-10-CM

## 2024-02-13 MED ORDER — LEVOTHYROXINE SODIUM 88 MCG PO TABS: 88.0000 ug | ORAL_TABLET | Freq: Every day | ORAL | 0 refills | Status: DC

## 2024-02-13 NOTE — Telephone Encounter (Signed)
Med refill request: Mycolog Last AEX: 07/24/23 Next OV: cancelled 10/08/23 appt, states pt is HR MCR Last MMG (if hormonal med) 09/25/23 Refill authorized: Please Advise, #30g, 0 RF

## 2024-02-13 NOTE — Telephone Encounter (Signed)
Copied from CRM [id number]. Topic: Clinical - Prescription Issue >> Feb 13, 2024 11:46 AM Orinda Kenner C wrote: Reason for CRM: Bonita Quin from [facility] [id number] states the patient is calling about the PA on Ozempic, the case is still open. Bonita Quin is asking our office to contact the [facility] coverage department [phone]. Please advise.

## 2024-02-13 NOTE — Telephone Encounter (Signed)
Copied from CRM [id number]. Topic: Clinical - Prescription Issue >> Feb 13, 2024  3:31 PM Alexis Shelton wrote: Reason for CRM: Patient is requesting to speak with Dr. Yetta Barre nurse regarding getting emergency fill of Ozempic. Patient is open to discussing an alternative medication. Please call her back as she is very worried that she hasn't has this medication in over 2 weeks.

## 2024-02-14 ENCOUNTER — Other Ambulatory Visit (HOSPITAL_COMMUNITY): Payer: Self-pay

## 2024-02-17 ENCOUNTER — Ambulatory Visit: Payer: Medicare HMO | Admitting: Psychology

## 2024-02-17 ENCOUNTER — Other Ambulatory Visit (HOSPITAL_COMMUNITY): Payer: Self-pay

## 2024-02-17 DIAGNOSIS — F3289 Other specified depressive episodes: Secondary | ICD-10-CM

## 2024-02-17 NOTE — Progress Notes (Signed)
 [facility] Counselor/Therapist Progress Note  Patient ID: Alexis Shelton, MRN: [id number]    Date: 02/17/24  Time Spent: 2:34 pm - 3:28 pm : 54  Minutes  Treatment Type: Individual Therapy.  Reported Symptoms: anxiety and depression.   Mental Status Exam: Appearance:  Neat and Well Groomed     Behavior: Appropriate  Motor: Normal  Speech/Language:  Clear and Coherent  Affect: Congruent  Mood: anxious and dysthymic  Thought process: normal  Thought content:   WNL  Sensory/Perceptual disturbances:   WNL  Orientation: oriented to person, place, time/date, and situation  Attention: Good  Concentration: Good  Memory: WNL  Fund of knowledge:  Good  Insight:   Good  Judgment:  Good  Impulse Control: Good   Risk Assessment: Danger to Self:  No Self-injurious Behavior: No Danger to Others: No Duty to Warn:no Physical Aggression / Violence:No  Access to Firearms a concern: No  Gang Involvement:No   Subjective:   Alexis Shelton participated in the session, in person in the office with the therapist, and consented to treatment Alexis Shelton reviewed the events of the past week. She noted continuing to grieve for her sister and noted her brother in-law recently had a stroke. She noted her grief affecting her motivation to leave the home and complete tasks. She endorsed lethargy, as well. She noted her younger sister losing weight, and noted anxiety regarding this. She noted consistent thinking about son and grandson. She noted her hope to reach out to her grandson's mother. She noted a lack of contact by her son, Alexis Shelton, despite effort. She noted being hopeful that her son gains clarity, in the future. She noted her son's history of difficulty managing interpersonal stressors, accepting responsibility for his actions, and managing his frustrations. We worked on exploring this during the session. She noted interpersonal stressors with a friend due to being asked to drive her  friend around and did not provide compensation for gas. We worked on exploring this during the session. Alexis Shelton noted her friend becoming upset with this discussion. Therapist praised Alexis Shelton for her efforts to advocate for self with friend. Therapist encouraged self-care, following up with her provider regarding her symptoms of lethargy. Therapist provided supportive therapy.   Interventions: CBT and interpersonal   Diagnosis:   Other depression  Psychiatric Treatment: No   Treatment Plan:  Client Abilities/Strengths Alexis Shelton is verbose and motivated for change.   Support System: Friend: Alexis Shelton  Client Treatment Preferences Outpatient therapy.  Client Statement of Needs Alexis Shelton would like to process relationships with son and sister, managing her overall symptoms, manage overall stressors, including financials, and process past events.  Additionally, processing aging, feeling lonely, and financial stressors.   Treatment Level Weekly  Symptoms  Depression: loss of interest, feeling down, difficulty sleeping (middle insomnia) ,lethargy, fluctuating appetite (poor appetite), , trouble concentration, psychomotor retardation, and denied history of SI   (Status: maintained) Anxiety: Feeling nervous, difficulty managing worry, worrying about different things, trouble relaxing, difficulty sitting still, easily irritable, feeling afraid something awful might happen.    (Status: maintained)  Goals:   Alexis Shelton experiences symptoms of depression and anxiety.    Target Date: 03/04/24 Frequency: Weekly  Progress: 0 Modality: individual    Therapist will provide referrals for additional resources as appropriate.  Therapist will provide psycho-education regarding Alexis Shelton's diagnosis and corresponding treatment approaches and interventions. Licensed Clinical Social Worker, [place], LCSW will support the patient's ability to achieve the goals identified. will employ CBT, BA, Problem-solving, Solution  Focused, Mindfulness,  coping skills, & other evidenced-based practices will be used to promote progress towards healthy functioning to help manage decrease symptoms associated with her diagnosis.   Reduce overall level, frequency, and intensity of the feelings of depression and anxiety evidenced by decreased overall symptoms from 6 to 7 days/week to 0 to 1 days/week per client report for at least 3 consecutive months. Verbally express understanding of the relationship between feelings of depression, anxiety and their impact on thinking patterns and behaviors. Verbalize an understanding of the role that distorted thinking plays in creating fears, excessive worry, and ruminations.  Alexis Shelton participated in the creation of the treatment plan)    Alexis Ovens, LCSW

## 2024-02-17 NOTE — Telephone Encounter (Signed)
 Please see below and advise if this applies to pt

## 2024-02-17 NOTE — Telephone Encounter (Signed)
 Per test claim, medication [facility]), is only covered for reducing CVD risk.   Insurance defines qualifying CVD risks as any of the following: Myocardial infarction (MI), (2) Prior ischemic or hemorrhagic stroke, (3) Symptomatic peripheral arterial disease (PAD) as evidence by one of the following (a) Intermittent claudication with ankle-brachial index (ABI) less than 0.85 (at rest); (b) Peripheral arterial revascularization procedure; (c) Amputation due to atherosclerotic disease  Does this apply to pt? Thanks

## 2024-02-21 ENCOUNTER — Other Ambulatory Visit (HOSPITAL_COMMUNITY): Payer: Self-pay

## 2024-02-24 ENCOUNTER — Ambulatory Visit (INDEPENDENT_AMBULATORY_CARE_PROVIDER_SITE_OTHER): Payer: Medicare HMO | Admitting: Internal Medicine

## 2024-02-24 ENCOUNTER — Encounter: Payer: Self-pay | Admitting: Internal Medicine

## 2024-02-24 VITALS — BP 160/74 | HR 71 | Temp 97.9°F | Resp 16 | Ht 62.0 in | Wt 170.6 lb

## 2024-02-24 DIAGNOSIS — N184 Chronic kidney disease, stage 4 (severe): Secondary | ICD-10-CM | POA: Diagnosis not present

## 2024-02-24 DIAGNOSIS — E875 Hyperkalemia: Secondary | ICD-10-CM

## 2024-02-24 DIAGNOSIS — I10 Essential (primary) hypertension: Secondary | ICD-10-CM

## 2024-02-24 DIAGNOSIS — E039 Hypothyroidism, unspecified: Secondary | ICD-10-CM

## 2024-02-24 DIAGNOSIS — E785 Hyperlipidemia, unspecified: Secondary | ICD-10-CM

## 2024-02-24 DIAGNOSIS — M1A09X0 Idiopathic chronic gout, multiple sites, without tophus (tophi): Secondary | ICD-10-CM

## 2024-02-24 DIAGNOSIS — F5104 Psychophysiologic insomnia: Secondary | ICD-10-CM | POA: Diagnosis not present

## 2024-02-24 DIAGNOSIS — R7303 Prediabetes: Secondary | ICD-10-CM

## 2024-02-24 DIAGNOSIS — N069 Isolated proteinuria with unspecified morphologic lesion: Secondary | ICD-10-CM | POA: Diagnosis not present

## 2024-02-24 MED ORDER — ESZOPICLONE 2 MG PO TABS: 2.0000 mg | ORAL_TABLET | Freq: Every evening | ORAL | 0 refills | Status: DC | PRN

## 2024-02-24 NOTE — Patient Instructions (Signed)
 Hypertension, Adult High blood pressure (hypertension) is when the force of blood pumping through the arteries is too strong. The arteries are the blood vessels that carry blood from the heart throughout the body. Hypertension forces the heart to work harder to pump blood and may cause arteries to become narrow or stiff. Untreated or uncontrolled hypertension can lead to a heart attack, heart failure, a stroke, kidney disease, and other problems. A blood pressure reading consists of a higher number over a lower number. Ideally, your blood pressure should be below 120/80. The first ("top") number is called the systolic pressure. It is a measure of the pressure in your arteries as your heart beats. The second ("bottom") number is called the diastolic pressure. It is a measure of the pressure in your arteries as the heart relaxes. What are the causes? The exact cause of this condition is not known. There are some conditions that result in high blood pressure. What increases the risk? Certain factors may make you more likely to develop high blood pressure. Some of these risk factors are under your control, including: Smoking. Not getting enough exercise or physical activity. Being overweight. Having too much fat, sugar, calories, or salt (sodium) in your diet. Drinking too much alcohol. Other risk factors include: Having a personal history of heart disease, diabetes, high cholesterol, or kidney disease. Stress. Having a family history of high blood pressure and high cholesterol. Having obstructive sleep apnea. Age. The risk increases with age. What are the signs or symptoms? High blood pressure may not cause symptoms. Very high blood pressure (hypertensive crisis) may cause: Headache. Fast or irregular heartbeats (palpitations). Shortness of breath. Nosebleed. Nausea and vomiting. Vision changes. Severe chest pain, dizziness, and seizures. How is this diagnosed? This condition is diagnosed by  measuring your blood pressure while you are seated, with your arm resting on a flat surface, your legs uncrossed, and your feet flat on the floor. The cuff of the blood pressure monitor will be placed directly against the skin of your upper arm at the level of your heart. Blood pressure should be measured at least twice using the same arm. Certain conditions can cause a difference in blood pressure between your right and left arms. If you have a high blood pressure reading during one visit or you have normal blood pressure with other risk factors, you may be asked to: Return on a different day to have your blood pressure checked again. Monitor your blood pressure at home for 1 week or longer. If you are diagnosed with hypertension, you may have other blood or imaging tests to help your health care provider understand your overall risk for other conditions. How is this treated? This condition is treated by making healthy lifestyle changes, such as eating healthy foods, exercising more, and reducing your alcohol intake. You may be referred for counseling on a healthy diet and physical activity. Your health care provider may prescribe medicine if lifestyle changes are not enough to get your blood pressure under control and if: Your systolic blood pressure is above 130. Your diastolic blood pressure is above 80. Your personal target blood pressure may vary depending on your medical conditions, your age, and other factors. Follow these instructions at home: Eating and drinking  Eat a diet that is high in fiber and potassium, and low in sodium, added sugar, and fat. An example of this eating plan is called the DASH diet. DASH stands for Dietary Approaches to Stop Hypertension. To eat this way: Eat  plenty of fresh fruits and vegetables. Try to fill one half of your plate at each meal with fruits and vegetables. Eat whole grains, such as whole-wheat pasta, brown rice, or whole-grain bread. Fill about one  fourth of your plate with whole grains. Eat or drink low-fat dairy products, such as skim milk or low-fat yogurt. Avoid fatty cuts of meat, processed or cured meats, and poultry with skin. Fill about one fourth of your plate with lean proteins, such as fish, chicken without skin, beans, eggs, or tofu. Avoid pre-made and processed foods. These tend to be higher in sodium, added sugar, and fat. Reduce your daily sodium intake. Many people with hypertension should eat less than 1,500 mg of sodium a day. Do not drink alcohol if: Your health care provider tells you not to drink. You are pregnant, may be pregnant, or are planning to become pregnant. If you drink alcohol: Limit how much you have to: 0-1 drink a day for women. 0-2 drinks a day for men. Know how much alcohol is in your drink. In the [place] one drink equals one 12 oz bottle of beer (355 mL), one 5 oz glass of wine (148 mL), or one 1 oz glass of hard liquor (44 mL). Lifestyle  Work with your health care provider to maintain a healthy body weight or to lose weight. Ask what an ideal weight is for you. Get at least 30 minutes of exercise that causes your heart to beat faster (aerobic exercise) most days of the week. Activities may include walking, swimming, or biking. Include exercise to strengthen your muscles (resistance exercise), such as Pilates or lifting weights, as part of your weekly exercise routine. Try to do these types of exercises for 30 minutes at least 3 days a week. Do not use any products that contain nicotine or tobacco. These products include cigarettes, chewing tobacco, and vaping devices, such as e-cigarettes. If you need help quitting, ask your health care provider. Monitor your blood pressure at home as told by your health care provider. Keep all follow-up visits. This is important. Medicines Take over-the-counter and prescription medicines only as told by your health care provider. Follow directions carefully. Blood  pressure medicines must be taken as prescribed. Do not skip doses of blood pressure medicine. Doing this puts you at risk for problems and can make the medicine less effective. Ask your health care provider about side effects or reactions to medicines that you should watch for. Contact a health care provider if you: Think you are having a reaction to a medicine you are taking. Have headaches that keep coming back (recurring). Feel dizzy. Have swelling in your ankles. Have trouble with your vision. Get help right away if you: Develop a severe headache or confusion. Have unusual weakness or numbness. Feel faint. Have severe pain in your chest or abdomen. Vomit repeatedly. Have trouble breathing. These symptoms may be an emergency. Get help right away. Call 911. Do not wait to see if the symptoms will go away. Do not drive yourself to the hospital. Summary Hypertension is when the force of blood pumping through your arteries is too strong. If this condition is not controlled, it may put you at risk for serious complications. Your personal target blood pressure may vary depending on your medical conditions, your age, and other factors. For most people, a normal blood pressure is less than 120/80. Hypertension is treated with lifestyle changes, medicines, or a combination of both. Lifestyle changes include losing weight, eating a healthy,  low-sodium diet, exercising more, and limiting alcohol. This information is not intended to replace advice given to you by your health care provider. Make sure you discuss any questions you have with your health care provider. Document Revised: 10/17/2021 Document Reviewed: 10/17/2021 Elsevier Patient Education  2024 [employer].

## 2024-02-24 NOTE — Progress Notes (Unsigned)
 Subjective:  Patient ID: Alexis Shelton, female    DOB: 10-Jun-1941  Age: 83 y.o. MRN: [id number]  CC: Hypertension   HPI [name] presents for f/up ----  Discussed the use of AI scribe software for clinical note transcription with the patient, who gave verbal consent to proceed.  History of Present Illness   Alexis Shelton is an 83 year old female who presents with recurrent abscesses.  She has a history of recurrent abscesses, having experienced similar episodes four or five times in her life. These abscesses have been large and challenging to manage.  The current abscess has been present for over a year, initially starting as a small lesion and gradually enlarging. It is located in a difficult-to-reach area, which complicates management. During the Christmas holidays, the abscess was particularly problematic, described as 'unbearable, practically.' Currently, it is at the end of its draining phase, with minimal drainage noted.  She has managed the abscess by cleansing with peroxide and allowing it to drain naturally without lancing. This process has been long and challenging, requiring the use of cotton shirts to absorb drainage.  She has attempted to seek advice from medical professionals over the phone but found it difficult to describe the condition effectively.   She has been taking melatonin for insomnia.       Outpatient Medications Prior to Visit  Medication Sig Dispense Refill   allopurinol (ZYLOPRIM) 100 MG tablet Take 1 tablet (100 mg total) by mouth 2 (two) times daily. 180 tablet 3   ASPIRIN LOW DOSE 81 MG tablet TAKE 1 TABLET (81 MG TOTAL) BY MOUTH DAILY. SWALLOW WHOLE. 90 tablet 3   levothyroxine (SYNTHROID) 88 MCG tablet Take 1 tablet (88 mcg total) by mouth daily before breakfast. 90 tablet 0   metoprolol succinate (TOPROL-XL) 25 MG 24 hr tablet TAKE 1 TABLET (25 MG TOTAL) BY MOUTH DAILY. 90 tablet 0   modafinil (PROVIGIL) 100 MG  tablet Take 1 tablet (100 mg total) by mouth daily. 30 tablet 2   montelukast (SINGULAIR) 10 MG tablet TAKE 1 TABLET BY MOUTH EVERYDAY AT BEDTIME 90 tablet 0   Multiple Vitamin (MULTIVITAMIN) tablet Take 1 tablet by mouth daily. 90 tablet 1   ondansetron (ZOFRAN-ODT) 4 MG disintegrating tablet Take 1 tablet (4 mg total) by mouth every 8 (eight) hours as needed for nausea or vomiting. 12 tablet 0   rosuvastatin (CRESTOR) 10 MG tablet TAKE 1 TABLET BY MOUTH EVERY DAY 90 tablet 1   SYMBICORT 160-4.5 MCG/ACT inhaler INHALE 2 PUFFS INTO THE LUNGS DAILY AS NEEDED (SHORTNESS OF BREATH/WHEEZING). 30.6 each 1   tiZANidine (ZANAFLEX) 4 MG tablet TAKE 1 TABLET BY MOUTH AT BEDTIME AS NEEDED FOR MUSCLE SPASMS. 90 tablet 1   traMADol (ULTRAM) 50 MG tablet Take 1 tablet (50 mg total) by mouth every 6 (six) hours as needed. (Patient taking differently: Take 50 mg by mouth at bedtime.) 120 tablet 1   Insulin Pen Needle 32G X 6 MM MISC 1 Act by Does not apply route once a week. 30 each 1   SHINGRIX injection      nystatin-triamcinolone ointment (MYCOLOG) APPLY 1 APPLICATION TOPICALLY 2 (TWO) TIMES DAILY. USE FOR ONE WEEK. 30 g 0   No facility-administered medications prior to visit.    ROS Review of Systems  Objective:  BP (!) 160/74 (BP Location: Left Arm, Patient Position: Sitting, Cuff Size: Large)   Pulse 71   Temp 97.9 F (36.6  C) (Oral)   Resp 16   Ht 5\' 2"  (1.575 m)   Wt 170 lb 9.6 oz (77.4 kg)   SpO2 93%   BMI 31.20 kg/m   BP Readings from Last 3 Encounters:  02/24/24 (!) 160/74  12/16/23 138/68  08/07/23 (!) 140/82    Wt Readings from Last 3 Encounters:  02/24/24 170 lb 9.6 oz (77.4 kg)  07/24/23 173 lb (78.5 kg)  07/08/23 174 lb (78.9 kg)    Physical Exam  Lab Results  Component Value Date   WBC 7.8 02/24/2024   HGB 14.1 02/24/2024   HCT 42.8 02/24/2024   PLT 234.0 02/24/2024   GLUCOSE 94 02/24/2024   CHOL 194 08/07/2023   TRIG 125.0 08/07/2023   HDL 51.40 08/07/2023    LDLDIRECT 175.0 06/06/2022   LDLCALC 117 (H) 08/07/2023   ALT 26 02/24/2024   AST 25 02/24/2024   NA 140 02/24/2024   K 5.2 No hemolysis seen (H) 02/24/2024   CL 103 02/24/2024   CREATININE 1.39 (H) 02/24/2024   BUN 26 (H) 02/24/2024   CO2 29 02/24/2024   TSH 0.90 02/24/2024   INR 1.1 12/08/2021   HGBA1C 5.9 02/24/2024   MICROALBUR 44.7 (H) 02/24/2024    MM 3D SCREENING MAMMOGRAM BILATERAL BREAST Result Date: 09/27/2023 CLINICAL DATA:  Screening. EXAM: DIGITAL SCREENING BILATERAL MAMMOGRAM WITH TOMOSYNTHESIS AND CAD TECHNIQUE: Bilateral screening digital craniocaudal and mediolateral oblique mammograms were obtained. Bilateral screening digital breast tomosynthesis was performed. The images were evaluated with computer-aided detection. COMPARISON:  Previous exam(s). ACR Breast Density Category b: There are scattered areas of fibroglandular density. FINDINGS: There are no findings suspicious for malignancy. IMPRESSION: No mammographic evidence of malignancy. A result letter of this screening mammogram will be mailed directly to the patient. RECOMMENDATION: Screening mammogram in one year. (Code:SM-B-01Y) BI-RADS CATEGORY  1: Negative. Electronically Signed   By: Ted Mcalpine M.D.   On: 09/27/2023 15:04    Assessment & Plan:  Essential hypertension -     CBC with Differential/Platelet; Future -     Basic metabolic panel; Future -     Hepatic function panel; Future  Idiopathic chronic gout of multiple sites without tophus -     Uric acid; Future  Hypothyroidism, unspecified type -     TSH; Future  Isolated proteinuria with morphologic lesion -     Urinalysis, Routine w reflex microscopic; Future -     Microalbumin / creatinine urine ratio; Future  Prediabetes -     Hemoglobin A1c; Future  Hyperlipidemia LDL goal <100 -     Hepatic function panel; Future  Insomnia, psychophysiological -     Eszopiclone; Take 1 tablet (2 mg total) by mouth at bedtime as needed for sleep.  Take immediately before bedtime  Dispense: 90 tablet; Refill: 0     Follow-up: Return in about 4 months (around 06/25/2024).  Sanda Linger, MD

## 2024-02-25 ENCOUNTER — Encounter: Payer: Self-pay | Admitting: Internal Medicine

## 2024-02-25 DIAGNOSIS — N184 Chronic kidney disease, stage 4 (severe): Secondary | ICD-10-CM | POA: Insufficient documentation

## 2024-02-25 LAB — URINALYSIS, ROUTINE W REFLEX MICROSCOPIC
Bilirubin Urine: NEGATIVE
Hgb urine dipstick: NEGATIVE
Ketones, ur: NEGATIVE
Nitrite: POSITIVE — AB
Specific Gravity, Urine: 1.02 (ref 1.000–1.030)
Total Protein, Urine: 100 — AB
Urine Glucose: NEGATIVE
Urobilinogen, UA: 0.2 (ref 0.0–1.0)
pH: 6 (ref 5.0–8.0)

## 2024-02-25 LAB — CBC WITH DIFFERENTIAL/PLATELET
Basophils Absolute: 0.1 10*3/uL (ref 0.0–0.1)
Basophils Relative: 0.7 % (ref 0.0–3.0)
Eosinophils Absolute: 0.3 10*3/uL (ref 0.0–0.7)
Eosinophils Relative: 4.1 % (ref 0.0–5.0)
HCT: 42.8 % (ref 36.0–46.0)
Hemoglobin: 14.1 g/dL (ref 12.0–15.0)
Lymphocytes Relative: 24.4 % (ref 12.0–46.0)
Lymphs Abs: 1.9 10*3/uL (ref 0.7–4.0)
MCHC: 32.9 g/dL (ref 30.0–36.0)
MCV: 99.8 fl (ref 78.0–100.0)
Monocytes Absolute: 0.7 10*3/uL (ref 0.1–1.0)
Monocytes Relative: 9.2 % (ref 3.0–12.0)
Neutro Abs: 4.8 10*3/uL (ref 1.4–7.7)
Neutrophils Relative %: 61.6 % (ref 43.0–77.0)
Platelets: 234 10*3/uL (ref 150.0–400.0)
RBC: 4.29 Mil/uL (ref 3.87–5.11)
RDW: 16.3 % — ABNORMAL HIGH (ref 11.5–15.5)
WBC: 7.8 10*3/uL (ref 4.0–10.5)

## 2024-02-25 LAB — HEPATIC FUNCTION PANEL
ALT: 26 U/L (ref 0–35)
AST: 25 U/L (ref 0–37)
Albumin: 4.3 g/dL (ref 3.5–5.2)
Alkaline Phosphatase: 51 U/L (ref 39–117)
Bilirubin, Direct: 0.1 mg/dL (ref 0.0–0.3)
Total Bilirubin: 0.4 mg/dL (ref 0.2–1.2)
Total Protein: 7.3 g/dL (ref 6.0–8.3)

## 2024-02-25 LAB — BASIC METABOLIC PANEL
BUN: 26 mg/dL — ABNORMAL HIGH (ref 6–23)
CO2: 29 meq/L (ref 19–32)
Calcium: 9.9 mg/dL (ref 8.4–10.5)
Chloride: 103 meq/L (ref 96–112)
Creatinine, Ser: 1.39 mg/dL — ABNORMAL HIGH (ref 0.40–1.20)
GFR: 34.85 mL/min — ABNORMAL LOW (ref >60.00)
Glucose, Bld: 94 mg/dL (ref 70–99)
Potassium: 5.2 meq/L — ABNORMAL HIGH (ref 3.5–5.1)
Sodium: 140 meq/L (ref 135–145)

## 2024-02-25 LAB — TSH: TSH: 0.9 u[IU]/mL (ref 0.35–5.50)

## 2024-02-25 LAB — MICROALBUMIN / CREATININE URINE RATIO
Creatinine,U: 78.8 mg/dL
Microalb Creat Ratio: 567.6 mg/g — ABNORMAL HIGH (ref 0.0–30.0)
Microalb, Ur: 44.7 mg/dL — ABNORMAL HIGH (ref 0.0–1.9)

## 2024-02-25 LAB — URIC ACID: Uric Acid, Serum: 4 mg/dL (ref 2.4–7.0)

## 2024-02-25 LAB — HEMOGLOBIN A1C: Hgb A1c MFr Bld: 5.9 % (ref 4.6–6.5)

## 2024-02-26 DIAGNOSIS — E875 Hyperkalemia: Secondary | ICD-10-CM | POA: Insufficient documentation

## 2024-02-26 MED ORDER — TERAZOSIN HCL 2 MG PO CAPS: 2.0000 mg | ORAL_CAPSULE | Freq: Every day | ORAL | 0 refills | Status: DC

## 2024-02-26 MED ORDER — LOKELMA 5 G PO PACK: 5.0000 g | PACK | Freq: Two times a day (BID) | ORAL | 0 refills | Status: DC

## 2024-02-27 ENCOUNTER — Telehealth

## 2024-02-27 DIAGNOSIS — Z91199 Patient's noncompliance with other medical treatment and regimen due to unspecified reason: Secondary | ICD-10-CM

## 2024-02-27 NOTE — Progress Notes (Signed)
 The patient no-showed for appointment despite this provider sending direct link, reaching out via phone with no response and waiting for at least 10 minutes from appointment time for patient to join. They will be marked as a NS for this appointment/time.   Of note- unsure if patient knew we would be trying to contact via phone as well as it went straight to voicemail when called.   Freddy Finner, NP

## 2024-02-28 ENCOUNTER — Telehealth: Payer: Self-pay | Admitting: *Deleted

## 2024-02-28 NOTE — Progress Notes (Signed)
 Care Guide Pharmacy Note  02/28/2024 Name: Alexis Shelton MRN: [id number] DOB: [birth year]  Referred By: Etta Grandchild, MD Reason for referral: Care Coordination (Outreach to schedule referral with pharmacist )   Alexis Shelton is a 83 y.o. year old female who is a primary care patient of Etta Grandchild, MD.  [name] was referred to the pharmacist for assistance related to: HTN  Successful contact was made with the patient to discuss pharmacy services including being ready for the pharmacist to call at least 5 minutes before the scheduled appointment time and to have medication bottles and any blood pressure readings ready for review. The patient agreed to meet with the pharmacist via telephone visit on 03/17/2024.  [name], CMA, Care Guide [facility]  [facility], [facility] Guide Direct Dial: [phone]  Fax: [phone] Website: [email or web address]

## 2024-02-28 NOTE — Progress Notes (Signed)
 Care Guide Pharmacy Note  02/28/2024 Name: Bentli Llorente MRN: [id number] DOB: 06/20/1941  Referred By: Etta Grandchild, MD Reason for referral: Care Coordination (Outreach to schedule referral with pharmacist )   Altamese Deguire is a 83 y.o. year old female who is a primary care patient of Etta Grandchild, MD.  [name] was referred to the pharmacist for assistance related to: HTN  An unsuccessful telephone outreach was attempted today to contact the patient who was referred to the pharmacy team for assistance with medication management. Additional attempts will be made to contact the patient.  [name], CMA, Care Guide [facility]  [facility], [facility] Guide Direct Dial: [phone]  Fax: [phone] Website: [email or web address]

## 2024-03-03 ENCOUNTER — Encounter: Payer: Self-pay | Admitting: Internal Medicine

## 2024-03-03 ENCOUNTER — Ambulatory Visit (INDEPENDENT_AMBULATORY_CARE_PROVIDER_SITE_OTHER): Admitting: Internal Medicine

## 2024-03-03 VITALS — BP 122/78 | HR 60 | Temp 98.5°F | Ht 62.0 in

## 2024-03-03 DIAGNOSIS — R7303 Prediabetes: Secondary | ICD-10-CM

## 2024-03-03 DIAGNOSIS — F419 Anxiety disorder, unspecified: Secondary | ICD-10-CM | POA: Diagnosis not present

## 2024-03-03 DIAGNOSIS — E559 Vitamin D deficiency, unspecified: Secondary | ICD-10-CM | POA: Diagnosis not present

## 2024-03-03 DIAGNOSIS — I10 Essential (primary) hypertension: Secondary | ICD-10-CM | POA: Diagnosis not present

## 2024-03-03 DIAGNOSIS — F32A Depression, unspecified: Secondary | ICD-10-CM | POA: Diagnosis not present

## 2024-03-03 DIAGNOSIS — E785 Hyperlipidemia, unspecified: Secondary | ICD-10-CM | POA: Diagnosis not present

## 2024-03-03 DIAGNOSIS — N184 Chronic kidney disease, stage 4 (severe): Secondary | ICD-10-CM | POA: Diagnosis not present

## 2024-03-03 MED ORDER — OZEMPIC (0.25 OR 0.5 MG/DOSE) 2 MG/3ML ~~LOC~~ SOPN: PEN_INJECTOR | SUBCUTANEOUS | 11 refills | Status: DC

## 2024-03-03 MED ORDER — ROSUVASTATIN CALCIUM 20 MG PO TABS: 20.0000 mg | ORAL_TABLET | Freq: Every day | ORAL | 3 refills | Status: DC

## 2024-03-03 NOTE — Progress Notes (Signed)
 Patient ID: Alexis Shelton, female   DOB: [birth year], 83 y.o.   MRN:       Chief Complaint: follow up HTN, HLD and pre -diabetes, anxiety depression - unable to see [facility] today       HPI:  Alexis Shelton is a 83 y.o. female here with c/o     Sister died recently with "suspicious circumstances" and has really been on edge an upset since then, drowned in hot tub and oxycontin addiction.  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  Needs new rx for ozempic as after jan 1 her insurance changed, though the ozempic was approved by previous insurance late 2024.   Hard to lose wt, but has lost some and now able to walk without cane.  Seeing therapist for depression , declines other treatment, lives alone with her cat for companionship, but the cat has been ill, and also other family issues with her son no longer speaking to her, he is bipolar.  Gets overwhelming. Declnies any pharmacologic tx.   Pt denies chest pain, increased sob or doe, wheezing, orthopnea, PND, increased LE swelling, palpitations, dizziness or syncope.   Pt denies polydipsia, polyuria, or new focal neuro s/s.   Wt Readings from Last 3 Encounters:  02/24/24 170 lb 9.6 oz (77.4 kg)  07/24/23 173 lb (78.5 kg)  07/08/23 174 lb (78.9 kg)   BP Readings from Last 3 Encounters:  03/03/24 122/78  02/24/24 (!) 160/74  12/16/23 138/68         Past Medical History:  Diagnosis Date   Arthritis    DDD (degenerative disc disease), lumbar 11/24/2018   Dizziness    Dyspnea    Elevated liver function tests    Gallstones    Hearing loss    History of stroke involving cerebellum 10/13/2018   Hypercholesteremia    Hypertension    Hypothyroidism    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   Migraines    Mild intermittent asthma without complication 02/11/2019   Primary osteoarthritis of both feet 11/24/2018   Proteinuria    Rheumatoid arthritis  (HCC) 07/04/2018   Stage 3 chronic kidney disease (HCC) 02/11/2019   Vitamin D deficiency 02/11/2019   Past Surgical History:  Procedure Laterality Date   ABDOMINAL HYSTERECTOMY     BREAST REDUCTION SURGERY Bilateral    CATARACT EXTRACTION, BILATERAL     CHOLECYSTECTOMY N/A 12/11/2021   Procedure: LAPAROSCOPIC CHOLECYSTECTOMY;  Surgeon: Almond Lint, MD;  Location: [facility];  Service: General;  Laterality: N/A;   ENDOSCOPIC RETROGRADE CHOLANGIOPANCREATOGRAPHY (ERCP) WITH PROPOFOL N/A 12/09/2021   Procedure: ENDOSCOPIC RETROGRADE CHOLANGIOPANCREATOGRAPHY (ERCP) WITH PROPOFOL;  Surgeon: Rachael Fee, MD;  Location: [facility];  Service: Endoscopy;  Laterality: N/A;   INCISION AND DRAINAGE BREAST ABSCESS Left    REDUCTION MAMMAPLASTY     REMOVAL OF STONES  12/09/2021   Procedure: REMOVAL OF STONES;  Surgeon: Rachael Fee, MD;  Location: [facility];  Service: Endoscopy;;   REPLACEMENT TOTAL KNEE Bilateral    SPHINCTEROTOMY  12/09/2021   Procedure: SPHINCTEROTOMY;  Surgeon: Rachael Fee, MD;  Location: [facility];  Service: Endoscopy;;   TONSILLECTOMY AND ADENOIDECTOMY     TOOTH EXTRACTION  2023   x2    reports that she quit smoking about 55 years ago. Her smoking use included cigarettes. She started smoking about 58 years ago. She has a 0.3 pack-year smoking history. She has never been exposed  to tobacco smoke. She has never used smokeless tobacco. She reports that she does not currently use alcohol. She reports that she does not use drugs. family history includes Arthritis in her mother and sister; Bipolar disorder in her son; GER disease in her maternal grandmother; Gallbladder disease in her maternal grandmother; Healthy in her son; Heart disease in her father, paternal uncle, and sister; Hyperlipidemia in her mother; Stroke in her mother. No Known Allergies Current Outpatient Medications on File Prior to Visit  Medication Sig Dispense Refill   allopurinol (ZYLOPRIM) 100  MG tablet Take 1 tablet (100 mg total) by mouth 2 (two) times daily. 180 tablet 3   ASPIRIN LOW DOSE 81 MG tablet TAKE 1 TABLET (81 MG TOTAL) BY MOUTH DAILY. SWALLOW WHOLE. 90 tablet 3   eszopiclone (LUNESTA) 2 MG TABS tablet Take 1 tablet (2 mg total) by mouth at bedtime as needed for sleep. Take immediately before bedtime 90 tablet 0   Insulin Pen Needle 32G X 6 MM MISC 1 Act by Does not apply route once a week. 30 each 1   levothyroxine (SYNTHROID) 88 MCG tablet Take 1 tablet (88 mcg total) by mouth daily before breakfast. 90 tablet 0   metoprolol succinate (TOPROL-XL) 25 MG 24 hr tablet TAKE 1 TABLET (25 MG TOTAL) BY MOUTH DAILY. 90 tablet 0   modafinil (PROVIGIL) 100 MG tablet Take 1 tablet (100 mg total) by mouth daily. 30 tablet 2   montelukast (SINGULAIR) 10 MG tablet TAKE 1 TABLET BY MOUTH EVERYDAY AT BEDTIME 90 tablet 0   Multiple Vitamin (MULTIVITAMIN) tablet Take 1 tablet by mouth daily. 90 tablet 1   ondansetron (ZOFRAN-ODT) 4 MG disintegrating tablet Take 1 tablet (4 mg total) by mouth every 8 (eight) hours as needed for nausea or vomiting. 12 tablet 0   sodium zirconium cyclosilicate (LOKELMA) 5 g packet Take 5 g by mouth 2 (two) times daily. 60 packet 0   SYMBICORT 160-4.5 MCG/ACT inhaler INHALE 2 PUFFS INTO THE LUNGS DAILY AS NEEDED (SHORTNESS OF BREATH/WHEEZING). 30.6 each 1   terazosin (HYTRIN) 2 MG capsule Take 1 capsule (2 mg total) by mouth at bedtime. 90 capsule 0   tiZANidine (ZANAFLEX) 4 MG tablet TAKE 1 TABLET BY MOUTH AT BEDTIME AS NEEDED FOR MUSCLE SPASMS. 90 tablet 1   traMADol (ULTRAM) 50 MG tablet Take 1 tablet (50 mg total) by mouth every 6 (six) hours as needed. (Patient taking differently: Take 50 mg by mouth at bedtime.) 120 tablet 1   No current facility-administered medications on file prior to visit.        ROS:  All others reviewed and negative.  Objective        PE:  BP 122/78 (BP Location: Right Arm, Patient Position: Sitting, Cuff Size: Normal)   Pulse  60   Temp 98.5 F (36.9 C) (Oral)   Ht 5\' 2"  (1.575 m)   SpO2 94%   BMI 31.20 kg/m                 Constitutional: Pt appears in NAD               HENT: Head: NCAT.                Right Ear: External ear normal.                 Left Ear: External ear normal.                Eyes: .  Pupils are equal, round, and reactive to light. Conjunctivae and EOM are normal               Nose: without d/c or deformity               Neck: Neck supple. Gross normal ROM               Cardiovascular: Normal rate and regular rhythm.                 Pulmonary/Chest: Effort normal and breath sounds without rales or wheezing.                Abd:  Soft, NT, ND, + BS, no organomegaly               Neurological: Pt is alert. At baseline orientation, motor grossly intact               Skin: Skin is warm. No rashes, no other new lesions, LE edema - none               Psychiatric: Pt behavior is normal without agitation   Micro: none  Cardiac tracings I have personally interpreted today:  none  Pertinent Radiological findings (summarize): none   Lab Results  Component Value Date   WBC 7.8 02/24/2024   HGB 14.1 02/24/2024   HCT 42.8 02/24/2024   PLT 234.0 02/24/2024   GLUCOSE 94 02/24/2024   CHOL 194 08/07/2023   TRIG 125.0 08/07/2023   HDL 51.40 08/07/2023   LDLDIRECT 175.0 06/06/2022   LDLCALC 117 (H) 08/07/2023   ALT 26 02/24/2024   AST 25 02/24/2024   NA 140 02/24/2024   K 5.2 No hemolysis seen (H) 02/24/2024   CL 103 02/24/2024   CREATININE 1.39 (H) 02/24/2024   BUN 26 (H) 02/24/2024   CO2 29 02/24/2024   TSH 0.90 02/24/2024   INR 1.1 12/08/2021   HGBA1C 5.9 02/24/2024   MICROALBUR 44.7 (H) 02/24/2024   Assessment/Plan:  Alexis Shelton is a 83 y.o. White or Caucasian [1] female with  has a past medical history of Arthritis, DDD (degenerative disc disease), lumbar (11/24/2018), Dizziness, Dyspnea, Elevated liver function tests, Gallstones, Hearing loss, History of stroke involving  cerebellum (10/13/2018), Hypercholesteremia, Hypertension, Hypothyroidism, Idiopathic gout of multiple sites (08/12/2018), Migraines, Mild intermittent asthma without complication (02/11/2019), Primary osteoarthritis of both feet (11/24/2018), Proteinuria, Rheumatoid arthritis (HCC) (07/04/2018), Stage 3 chronic kidney disease (HCC) (02/11/2019), and Vitamin D deficiency (02/11/2019).  Chronic renal disease, stage 4, severely decreased glomerular filtration rate (GFR) between 15-29 mL/min/1.73 square meter (HCC) Lab Results  Component Value Date   CREATININE 1.39 (H) 02/24/2024   Stable overall, cont to avoid nephrotoxins   Essential hypertension BP Readings from Last 3 Encounters:  03/03/24 122/78  02/24/24 (!) 160/74  12/16/23 138/68   Stable, pt to continue medical treatment toprol xl 25 qd   Hyperlipidemia LDL goal <100 Lab Results  Component Value Date   LDLCALC 117 (H) 08/07/2023   uncontrolled pt for increased crestor 20 qd   Prediabetes Lab Results  Component Value Date   HGBA1C 5.9 02/24/2024   Stable, pt to continue current medical treatment  - diet, wt control; pt adamant to try to start ozempic 0.25 mg weekly if ok with insurance   Vitamin D deficiency Last vitamin D Lab Results  Component Value Date   VD25OH 58 03/06/2021   Stable, cont oral replacement   Anxiety and depression A large element today  but after discussion pt declines specific pharmacologic tx or referral counseling Followup: Return in about 3 months (around 06/03/2024) for follow up to Dr Yetta Barre.  Oliver Barre, MD 03/07/2024 4:01 PM [facility]

## 2024-03-03 NOTE — Patient Instructions (Signed)
 Ok to increase the crestor to 20 mg per day  Please take all new medication as prescribed -the ozempic 0.25 mg weekly  Please continue all other medications as before, and refills have been done if requested.  Please have the pharmacy call with any other refills you may need.  Please keep your appointments with your specialists as you may have planned  Please make an Appointment to return in 3 months, or sooner if needed, to Dr Yetta Barre

## 2024-03-06 ENCOUNTER — Telehealth: Payer: Self-pay

## 2024-03-06 ENCOUNTER — Encounter: Payer: Self-pay | Admitting: Internal Medicine

## 2024-03-06 NOTE — Telephone Encounter (Signed)
 Copied from CRM [id number]. Topic: Clinical - Prescription Issue >> Mar 06, 2024 10:50 AM Sonny Dandy B wrote: Reason for CRM:pt called regarding Medication for [place],  pt states the medication has a $400 copay. She can not afford  the medication.  Please call pt back to discuss other medication with the pt. Please call the pt back [phone]  Medication is not covered pt states she sent a message to the provider in my chart.

## 2024-03-06 NOTE — Telephone Encounter (Signed)
**Note De-identified  [place] Obfuscation** Please advise 

## 2024-03-07 DIAGNOSIS — F32A Depression, unspecified: Secondary | ICD-10-CM | POA: Insufficient documentation

## 2024-03-07 DIAGNOSIS — F419 Anxiety disorder, unspecified: Secondary | ICD-10-CM | POA: Insufficient documentation

## 2024-03-07 NOTE — Assessment & Plan Note (Addendum)
 Lab Results  Component Value Date   HGBA1C 5.9 02/24/2024   Stable, pt to continue current medical treatment  - diet, wt control; pt adamant to try to start ozempic 0.25 mg weekly if ok with insurance

## 2024-03-07 NOTE — Assessment & Plan Note (Signed)
 Lab Results  Component Value Date   CREATININE 1.39 (H) 02/24/2024   Stable overall, cont to avoid nephrotoxins

## 2024-03-07 NOTE — Assessment & Plan Note (Signed)
 Lab Results  Component Value Date   LDLCALC 117 (H) 08/07/2023   uncontrolled pt for increased crestor 20 qd

## 2024-03-07 NOTE — Assessment & Plan Note (Signed)
 A large element today but after discussion pt declines specific pharmacologic tx or referral counseling

## 2024-03-07 NOTE — Assessment & Plan Note (Signed)
 BP Readings from Last 3 Encounters:  03/03/24 122/78  02/24/24 (!) 160/74  12/16/23 138/68   Stable, pt to continue medical treatment toprol xl 25 qd

## 2024-03-07 NOTE — Assessment & Plan Note (Signed)
 Last vitamin D Lab Results  Component Value Date   VD25OH 58 03/06/2021   Stable, cont oral replacement

## 2024-03-13 ENCOUNTER — Inpatient Hospital Stay (HOSPITAL_COMMUNITY)
Admission: EM | Admit: 2024-03-13 | Discharge: 2024-03-16 | DRG: 177 | Disposition: A | Discharge status: Home / Self Care | Attending: Obstetrics and Gynecology | Admitting: Obstetrics and Gynecology

## 2024-03-13 ENCOUNTER — Encounter (HOSPITAL_COMMUNITY): Payer: Self-pay

## 2024-03-13 ENCOUNTER — Emergency Department (HOSPITAL_COMMUNITY)

## 2024-03-13 ENCOUNTER — Other Ambulatory Visit: Payer: Self-pay

## 2024-03-13 DIAGNOSIS — J4520 Mild intermittent asthma, uncomplicated: Secondary | ICD-10-CM | POA: Diagnosis present

## 2024-03-13 DIAGNOSIS — I959 Hypotension, unspecified: Secondary | ICD-10-CM | POA: Diagnosis not present

## 2024-03-13 DIAGNOSIS — M19071 Primary osteoarthritis, right ankle and foot: Secondary | ICD-10-CM | POA: Diagnosis present

## 2024-03-13 DIAGNOSIS — J9601 Acute respiratory failure with hypoxia: Secondary | ICD-10-CM | POA: Diagnosis not present

## 2024-03-13 DIAGNOSIS — R0989 Other specified symptoms and signs involving the circulatory and respiratory systems: Secondary | ICD-10-CM | POA: Diagnosis not present

## 2024-03-13 DIAGNOSIS — R509 Fever, unspecified: Secondary | ICD-10-CM | POA: Diagnosis not present

## 2024-03-13 DIAGNOSIS — Z79890 Hormone replacement therapy (postmenopausal): Secondary | ICD-10-CM

## 2024-03-13 DIAGNOSIS — R8290 Unspecified abnormal findings in urine: Secondary | ICD-10-CM | POA: Diagnosis present

## 2024-03-13 DIAGNOSIS — Z8249 Family history of ischemic heart disease and other diseases of the circulatory system: Secondary | ICD-10-CM

## 2024-03-13 DIAGNOSIS — Z79899 Other long term (current) drug therapy: Secondary | ICD-10-CM

## 2024-03-13 DIAGNOSIS — R0602 Shortness of breath: Secondary | ICD-10-CM | POA: Diagnosis not present

## 2024-03-13 DIAGNOSIS — T380X5A Adverse effect of glucocorticoids and synthetic analogues, initial encounter: Secondary | ICD-10-CM | POA: Diagnosis present

## 2024-03-13 DIAGNOSIS — Z90710 Acquired absence of both cervix and uterus: Secondary | ICD-10-CM

## 2024-03-13 DIAGNOSIS — I160 Hypertensive urgency: Secondary | ICD-10-CM | POA: Diagnosis present

## 2024-03-13 DIAGNOSIS — R5383 Other fatigue: Secondary | ICD-10-CM | POA: Diagnosis not present

## 2024-03-13 DIAGNOSIS — R4182 Altered mental status, unspecified: Secondary | ICD-10-CM | POA: Diagnosis not present

## 2024-03-13 DIAGNOSIS — M19072 Primary osteoarthritis, left ankle and foot: Secondary | ICD-10-CM | POA: Diagnosis present

## 2024-03-13 DIAGNOSIS — Z8261 Family history of arthritis: Secondary | ICD-10-CM

## 2024-03-13 DIAGNOSIS — I10 Essential (primary) hypertension: Secondary | ICD-10-CM | POA: Diagnosis present

## 2024-03-13 DIAGNOSIS — U071 COVID-19: Secondary | ICD-10-CM | POA: Diagnosis not present

## 2024-03-13 DIAGNOSIS — Z83438 Family history of other disorder of lipoprotein metabolism and other lipidemia: Secondary | ICD-10-CM

## 2024-03-13 DIAGNOSIS — Z96653 Presence of artificial knee joint, bilateral: Secondary | ICD-10-CM | POA: Diagnosis present

## 2024-03-13 DIAGNOSIS — J4489 Other specified chronic obstructive pulmonary disease: Secondary | ICD-10-CM | POA: Diagnosis present

## 2024-03-13 DIAGNOSIS — Z8673 Personal history of transient ischemic attack (TIA), and cerebral infarction without residual deficits: Secondary | ICD-10-CM

## 2024-03-13 DIAGNOSIS — N1832 Chronic kidney disease, stage 3b: Secondary | ICD-10-CM | POA: Diagnosis present

## 2024-03-13 DIAGNOSIS — E7800 Pure hypercholesterolemia, unspecified: Secondary | ICD-10-CM | POA: Diagnosis present

## 2024-03-13 DIAGNOSIS — E669 Obesity, unspecified: Secondary | ICD-10-CM | POA: Diagnosis present

## 2024-03-13 DIAGNOSIS — R0902 Hypoxemia: Secondary | ICD-10-CM | POA: Diagnosis not present

## 2024-03-13 DIAGNOSIS — F419 Anxiety disorder, unspecified: Secondary | ICD-10-CM | POA: Diagnosis present

## 2024-03-13 DIAGNOSIS — E1122 Type 2 diabetes mellitus with diabetic chronic kidney disease: Secondary | ICD-10-CM | POA: Diagnosis present

## 2024-03-13 DIAGNOSIS — E039 Hypothyroidism, unspecified: Secondary | ICD-10-CM | POA: Diagnosis present

## 2024-03-13 DIAGNOSIS — Z7982 Long term (current) use of aspirin: Secondary | ICD-10-CM

## 2024-03-13 DIAGNOSIS — I129 Hypertensive chronic kidney disease with stage 1 through stage 4 chronic kidney disease, or unspecified chronic kidney disease: Secondary | ICD-10-CM | POA: Diagnosis present

## 2024-03-13 DIAGNOSIS — M069 Rheumatoid arthritis, unspecified: Secondary | ICD-10-CM | POA: Diagnosis present

## 2024-03-13 DIAGNOSIS — Z823 Family history of stroke: Secondary | ICD-10-CM

## 2024-03-13 DIAGNOSIS — J45909 Unspecified asthma, uncomplicated: Secondary | ICD-10-CM | POA: Diagnosis present

## 2024-03-13 DIAGNOSIS — Z6831 Body mass index (BMI) 31.0-31.9, adult: Secondary | ICD-10-CM

## 2024-03-13 DIAGNOSIS — G928 Other toxic encephalopathy: Secondary | ICD-10-CM | POA: Diagnosis present

## 2024-03-13 DIAGNOSIS — Z87891 Personal history of nicotine dependence: Secondary | ICD-10-CM

## 2024-03-13 DIAGNOSIS — R410 Disorientation, unspecified: Secondary | ICD-10-CM | POA: Diagnosis not present

## 2024-03-13 DIAGNOSIS — Z7951 Long term (current) use of inhaled steroids: Secondary | ICD-10-CM

## 2024-03-13 LAB — COMPREHENSIVE METABOLIC PANEL
ALT: 40 U/L (ref 0–44)
AST: 42 U/L — ABNORMAL HIGH (ref 15–41)
Albumin: 3 g/dL — ABNORMAL LOW (ref 3.5–5.0)
Alkaline Phosphatase: 37 U/L — ABNORMAL LOW (ref 38–126)
Anion gap: 7 (ref 5–15)
BUN: 28 mg/dL — ABNORMAL HIGH (ref 8–23)
CO2: 25 mmol/L (ref 22–32)
Calcium: 8.4 mg/dL — ABNORMAL LOW (ref 8.9–10.3)
Chloride: 105 mmol/L (ref 98–111)
Creatinine, Ser: 1.38 mg/dL — ABNORMAL HIGH (ref 0.44–1.00)
GFR, Estimated: 38 mL/min — ABNORMAL LOW (ref >60)
Glucose, Bld: 92 mg/dL (ref 70–99)
Potassium: 4.5 mmol/L (ref 3.5–5.1)
Sodium: 137 mmol/L (ref 135–145)
Total Bilirubin: 0.4 mg/dL (ref 0.0–1.2)
Total Protein: 6 g/dL — ABNORMAL LOW (ref 6.5–8.1)

## 2024-03-13 LAB — CBC WITH DIFFERENTIAL/PLATELET
Abs Immature Granulocytes: 0.03 10*3/uL (ref 0.00–0.07)
Basophils Absolute: 0 10*3/uL (ref 0.0–0.1)
Basophils Relative: 0 %
Eosinophils Absolute: 0.1 10*3/uL (ref 0.0–0.5)
Eosinophils Relative: 3 %
HCT: 37.7 % (ref 36.0–46.0)
Hemoglobin: 12.2 g/dL (ref 12.0–15.0)
Immature Granulocytes: 1 %
Lymphocytes Relative: 25 %
Lymphs Abs: 1.2 10*3/uL (ref 0.7–4.0)
MCH: 32.9 pg (ref 26.0–34.0)
MCHC: 32.4 g/dL (ref 30.0–36.0)
MCV: 101.6 fL — ABNORMAL HIGH (ref 80.0–100.0)
Monocytes Absolute: 0.6 10*3/uL (ref 0.1–1.0)
Monocytes Relative: 12 %
Neutro Abs: 2.9 10*3/uL (ref 1.7–7.7)
Neutrophils Relative %: 59 %
Platelets: 166 10*3/uL (ref 150–400)
RBC: 3.71 MIL/uL — ABNORMAL LOW (ref 3.87–5.11)
RDW: 15.3 % (ref 11.5–15.5)
WBC: 4.8 10*3/uL (ref 4.0–10.5)
nRBC: 0 % (ref 0.0–0.2)

## 2024-03-13 LAB — RESP PANEL BY RT-PCR (RSV, FLU A&B, COVID)  RVPGX2
Influenza A by PCR: NEGATIVE
Influenza B by PCR: NEGATIVE
Resp Syncytial Virus by PCR: NEGATIVE
SARS Coronavirus 2 by RT PCR: POSITIVE — AB

## 2024-03-13 LAB — BLOOD GAS, VENOUS
Acid-Base Excess: 1.1 mmol/L (ref 0.0–2.0)
Bicarbonate: 26.6 mmol/L (ref 20.0–28.0)
O2 Saturation: 43.1 %
Patient temperature: 37
pCO2, Ven: 45 mmHg (ref 44–60)
pH, Ven: 7.38 (ref 7.25–7.43)
pO2, Ven: <31 mmHg — CL (ref 32–45)

## 2024-03-13 LAB — BRAIN NATRIURETIC PEPTIDE: B Natriuretic Peptide: 138.6 pg/mL — ABNORMAL HIGH (ref 0.0–100.0)

## 2024-03-13 LAB — CBG MONITORING, ED: Glucose-Capillary: 286 mg/dL — ABNORMAL HIGH (ref 70–99)

## 2024-03-13 LAB — TROPONIN I (HIGH SENSITIVITY)
Troponin I (High Sensitivity): 12 ng/L (ref <18)
Troponin I (High Sensitivity): 13 ng/L (ref <18)

## 2024-03-13 MED ORDER — MAGNESIUM SULFATE 2 GM/50ML IV SOLN
2.0000 g | Freq: Once | INTRAVENOUS | Status: AC
  Administered 2024-03-13: 2 g via INTRAVENOUS
  Filled 2024-03-13: qty 50

## 2024-03-13 MED ORDER — INSULIN ASPART 100 UNIT/ML IJ SOLN
0.0000 [IU] | Freq: Three times a day (TID) | INTRAMUSCULAR | Status: DC
  Administered 2024-03-14: 2 [IU] via SUBCUTANEOUS
  Administered 2024-03-14 (×2): 3 [IU] via SUBCUTANEOUS
  Administered 2024-03-15: 2 [IU] via SUBCUTANEOUS
  Filled 2024-03-13: qty 0.15

## 2024-03-13 MED ORDER — SODIUM CHLORIDE 0.9 % IV SOLN
200.0000 mg | Freq: Once | INTRAVENOUS | Status: AC
  Administered 2024-03-13: 200 mg via INTRAVENOUS
  Filled 2024-03-13: qty 40

## 2024-03-13 MED ORDER — DEXAMETHASONE 4 MG PO TABS
6.0000 mg | ORAL_TABLET | ORAL | Status: DC
  Administered 2024-03-13 – 2024-03-14 (×2): 6 mg via ORAL
  Filled 2024-03-13: qty 1
  Filled 2024-03-13: qty 2

## 2024-03-13 MED ORDER — METHYLPREDNISOLONE SODIUM SUCC 125 MG IJ SOLR
125.0000 mg | Freq: Once | INTRAMUSCULAR | Status: AC
  Administered 2024-03-13: 125 mg via INTRAVENOUS
  Filled 2024-03-13: qty 2

## 2024-03-13 MED ORDER — IPRATROPIUM-ALBUTEROL 0.5-2.5 (3) MG/3ML IN SOLN
3.0000 mL | RESPIRATORY_TRACT | Status: AC
  Administered 2024-03-13 (×2): 3 mL via RESPIRATORY_TRACT
  Filled 2024-03-13: qty 6

## 2024-03-13 MED ORDER — PANTOPRAZOLE SODIUM 40 MG PO TBEC
40.0000 mg | DELAYED_RELEASE_TABLET | Freq: Every day | ORAL | Status: DC
  Administered 2024-03-13 – 2024-03-16 (×4): 40 mg via ORAL
  Filled 2024-03-13 (×4): qty 1

## 2024-03-13 MED ORDER — MONTELUKAST SODIUM 10 MG PO TABS
10.0000 mg | ORAL_TABLET | Freq: Every day | ORAL | Status: DC
  Administered 2024-03-13 – 2024-03-15 (×3): 10 mg via ORAL
  Filled 2024-03-13 (×3): qty 1

## 2024-03-13 MED ORDER — SODIUM CHLORIDE 0.9 % IV SOLN
100.0000 mg | Freq: Every day | INTRAVENOUS | Status: AC
  Administered 2024-03-14 – 2024-03-15 (×2): 100 mg via INTRAVENOUS
  Filled 2024-03-13 (×2): qty 20

## 2024-03-13 MED ORDER — IPRATROPIUM-ALBUTEROL 0.5-2.5 (3) MG/3ML IN SOLN
3.0000 mL | RESPIRATORY_TRACT | Status: DC
  Administered 2024-03-13 – 2024-03-14 (×7): 3 mL via RESPIRATORY_TRACT
  Filled 2024-03-13 (×6): qty 3

## 2024-03-13 MED ORDER — FLUTICASONE FUROATE-VILANTEROL 200-25 MCG/ACT IN AEPB
1.0000 | INHALATION_SPRAY | Freq: Every day | RESPIRATORY_TRACT | Status: DC
  Administered 2024-03-14 – 2024-03-16 (×3): 1 via RESPIRATORY_TRACT
  Filled 2024-03-13: qty 28

## 2024-03-13 MED ORDER — INSULIN ASPART 100 UNIT/ML IJ SOLN
0.0000 [IU] | Freq: Every day | INTRAMUSCULAR | Status: DC
  Filled 2024-03-13: qty 0.05

## 2024-03-13 NOTE — H&P (Signed)
 History and Physical    Patient: Alexis Shelton NWG:[id number] DOB: 04-06-1941 DOA: 03/13/2024 DOS: the patient was seen and examined on 03/13/2024 PCP: Etta Grandchild, MD  Patient coming from: Home  Chief Complaint:  Chief Complaint  Patient presents with   Altered Mental Status   HPI: Alexis Shelton is a 83 y.o. female with medical history significant of HTN, HLD and pre -diabetes, anxiety depression.  There is no report of patient having COPD or prior tobacco use.  Patient was in her usual state of health till approximately 3 days ago when she reports a simultaneous onset of cough which was initially dry but since then productive of scant mucus, patient has not noted the color of this.  This was associated with sensation of shortness of breath.  Profound weakness and inability to perform activities of daily living.  Patient reports a Tmax of 100 F at home.  No leg swelling chest pain or cramping.  Patient actually had EMS called to her house approximately 2 days ago.  As per report given to me verbally, patient was apparently hypoxic back then but refused to come to the ER.  Patient does not use supplementary oxygen at home.  Subsequently patient's family member recalled EMS today, patient was hypoxic, requiring 4 L/min of supplementary oxygen.  Brought to [facility] long ER.  Patient offers no new complaints since arrival to the ER as above.  Patient has been maintained on 4 L/min of supplementary oxygen and has received DuoNeb therapy as well as Solu-Medrol.  Patient is found to be COVID-19 positive and as per exam by the ER provider has bilateral wheezing diffuse diminished air entry. Review of Systems: As mentioned in the history of present illness. All other systems reviewed and are negative. Past Medical History:  Diagnosis Date   Arthritis    DDD (degenerative disc disease), lumbar 11/24/2018   Dizziness    Dyspnea    Elevated liver function tests    Gallstones    Hearing  loss    History of stroke involving cerebellum 10/13/2018   Hypercholesteremia    Hypertension    Hypothyroidism    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   Migraines    Mild intermittent asthma without complication 02/11/2019   Primary osteoarthritis of both feet 11/24/2018   Proteinuria    Rheumatoid arthritis (HCC) 07/04/2018   Stage 3 chronic kidney disease (HCC) 02/11/2019   Vitamin D deficiency 02/11/2019   Past Surgical History:  Procedure Laterality Date   ABDOMINAL HYSTERECTOMY     BREAST REDUCTION SURGERY Bilateral    CATARACT EXTRACTION, BILATERAL     CHOLECYSTECTOMY N/A 12/11/2021   Procedure: LAPAROSCOPIC CHOLECYSTECTOMY;  Surgeon: Almond Lint, MD;  Location: [facility] ORS;  Service: General;  Laterality: N/A;   ENDOSCOPIC RETROGRADE CHOLANGIOPANCREATOGRAPHY (ERCP) WITH PROPOFOL N/A 12/09/2021   Procedure: ENDOSCOPIC RETROGRADE CHOLANGIOPANCREATOGRAPHY (ERCP) WITH PROPOFOL;  Surgeon: Rachael Fee, MD;  Location: [facility] ENDOSCOPY;  Service: Endoscopy;  Laterality: N/A;   INCISION AND DRAINAGE BREAST ABSCESS Left    REDUCTION MAMMAPLASTY     REMOVAL OF STONES  12/09/2021   Procedure: REMOVAL OF STONES;  Surgeon: Rachael Fee, MD;  Location: [facility] ENDOSCOPY;  Service: Endoscopy;;   REPLACEMENT TOTAL KNEE Bilateral    SPHINCTEROTOMY  12/09/2021   Procedure: SPHINCTEROTOMY;  Surgeon: Rachael Fee, MD;  Location: [facility] ENDOSCOPY;  Service: Endoscopy;;   TONSILLECTOMY AND ADENOIDECTOMY     TOOTH EXTRACTION  2023   x2   Social History:  reports that she quit smoking about 55 years ago. Her smoking use included cigarettes. She started smoking about 58 years ago. She has a 0.3 pack-year smoking history. She has never been exposed to tobacco smoke. She has never used smokeless tobacco. She reports that she does not currently use alcohol. She reports that she does not use drugs.  No Known Allergies  Family History  Problem Relation Age of Onset   Stroke Mother     Hyperlipidemia Mother    Arthritis Mother    Heart disease Father    Arthritis Sister    Heart disease Sister    Gallbladder disease Maternal Grandmother        radiation   GER disease Maternal Grandmother    Bipolar disorder Son    Healthy Son    Heart disease Paternal Uncle        x 7   Colon cancer Neg Hx    Esophageal cancer Neg Hx    Rectal cancer Neg Hx    Stomach cancer Neg Hx    Cancer Neg Hx     Prior to Admission medications   Medication Sig Start Date End Date Taking? Authorizing Provider  allopurinol (ZYLOPRIM) 100 MG tablet Take 1 tablet (100 mg total) by mouth 2 (two) times daily. 04/10/23   Etta Grandchild, MD  ASPIRIN LOW DOSE 81 MG tablet TAKE 1 TABLET (81 MG TOTAL) BY MOUTH DAILY. SWALLOW WHOLE. 04/02/23   Etta Grandchild, MD  eszopiclone (LUNESTA) 2 MG TABS tablet Take 1 tablet (2 mg total) by mouth at bedtime as needed for sleep. Take immediately before bedtime 02/24/24   Etta Grandchild, MD  Insulin Pen Needle 32G X 6 MM MISC 1 Act by Does not apply route once a week. 12/16/23   Etta Grandchild, MD  levothyroxine (SYNTHROID) 88 MCG tablet Take 1 tablet (88 mcg total) by mouth daily before breakfast. 02/13/24   Etta Grandchild, MD  metoprolol succinate (TOPROL-XL) 25 MG 24 hr tablet TAKE 1 TABLET (25 MG TOTAL) BY MOUTH DAILY. 02/10/24   Etta Grandchild, MD  modafinil (PROVIGIL) 100 MG tablet Take 1 tablet (100 mg total) by mouth daily. 12/16/23   Etta Grandchild, MD  montelukast (SINGULAIR) 10 MG tablet TAKE 1 TABLET BY MOUTH EVERYDAY AT BEDTIME 02/10/24   Etta Grandchild, MD  Multiple Vitamin (MULTIVITAMIN) tablet Take 1 tablet by mouth daily. 12/25/22   Etta Grandchild, MD  ondansetron (ZOFRAN-ODT) 4 MG disintegrating tablet Take 1 tablet (4 mg total) by mouth every 8 (eight) hours as needed for nausea or vomiting. 06/27/23   Rondel Baton, MD  rosuvastatin (CRESTOR) 20 MG tablet Take 1 tablet (20 mg total) by mouth daily. 03/03/24   Corwin Levins, MD  Semaglutide,0.25  or 0.5MG /DOS, (OZEMPIC, 0.25 OR 0.5 MG/DOSE,) 2 MG/3ML SOPN Take 0.25 mg subcutaneous once weekly 03/03/24   Corwin Levins, MD  sodium zirconium cyclosilicate (LOKELMA) 5 g packet Take 5 g by mouth 2 (two) times daily. 02/26/24   Etta Grandchild, MD  SYMBICORT 160-4.5 MCG/ACT inhaler INHALE 2 PUFFS INTO THE LUNGS DAILY AS NEEDED (SHORTNESS OF BREATH/WHEEZING). 11/06/23   Etta Grandchild, MD  terazosin (HYTRIN) 2 MG capsule Take 1 capsule (2 mg total) by mouth at bedtime. 02/26/24   Etta Grandchild, MD  tiZANidine (ZANAFLEX) 4 MG tablet TAKE 1 TABLET BY MOUTH AT BEDTIME AS NEEDED FOR MUSCLE SPASMS. 01/05/24   Etta Grandchild, MD  traMADol (  ULTRAM) 50 MG tablet Take 1 tablet (50 mg total) by mouth every 6 (six) hours as needed. Patient taking differently: Take 50 mg by mouth at bedtime. 04/10/23   Etta Grandchild, MD    Physical Exam: Vitals:   03/13/24 1424 03/13/24 1730 03/13/24 1832  BP: (!) 135/52 (!) 172/57   Pulse: (!) 55 (!) 55   Resp: 18 18   Temp: 97.9 F (36.6 C)  98.5 F (36.9 C)  TempSrc: Oral  Oral  SpO2: 96% 98%    General: Patient is alert and awake, gives a reasonably coherent account of her symptoms does not appear to be distressed Respiratory exam: Diffusely diminished breath sounds with diffuse scant expiratory wheezes.  Patient is on 4 L/min of supplementary oxygen.  When patient takes off the oxygen, her saturations dipped down to 88% at rest. Cardiovascular exam S1-S2 normal Abdomen all quadrants are soft nontender Extremities warm without edema. Data Reviewed:  Labs on Admission:  Results for orders placed or performed during the hospital encounter of 03/13/24 (from the past 24 hours)  CBC with Differential     Status: Abnormal   Collection Time: 03/13/24  3:34 PM  Result Value Ref Range   WBC 4.8 4.0 - 10.5 K/uL   RBC 3.71 (L) 3.87 - 5.11 MIL/uL   Hemoglobin 12.2 12.0 - 15.0 g/dL   HCT 19.1 47.8 - 29.5 %   MCV 101.6 (H) 80.0 - 100.0 fL   MCH 32.9 26.0 - 34.0 pg    MCHC 32.4 30.0 - 36.0 g/dL   RDW 62.1 30.8 - 65.7 %   Platelets 166 150 - 400 K/uL   nRBC 0.0 0.0 - 0.2 %   Neutrophils Relative % 59 %   Neutro Abs 2.9 1.7 - 7.7 K/uL   Lymphocytes Relative 25 %   Lymphs Abs 1.2 0.7 - 4.0 K/uL   Monocytes Relative 12 %   Monocytes Absolute 0.6 0.1 - 1.0 K/uL   Eosinophils Relative 3 %   Eosinophils Absolute 0.1 0.0 - 0.5 K/uL   Basophils Relative 0 %   Basophils Absolute 0.0 0.0 - 0.1 K/uL   Immature Granulocytes 1 %   Abs Immature Granulocytes 0.03 0.00 - 0.07 K/uL  Comprehensive metabolic panel     Status: Abnormal   Collection Time: 03/13/24  3:34 PM  Result Value Ref Range   Sodium 137 135 - 145 mmol/L   Potassium 4.5 3.5 - 5.1 mmol/L   Chloride 105 98 - 111 mmol/L   CO2 25 22 - 32 mmol/L   Glucose, Bld 92 70 - 99 mg/dL   BUN 28 (H) 8 - 23 mg/dL   Creatinine, Ser 8.46 (H) 0.44 - 1.00 mg/dL   Calcium 8.4 (L) 8.9 - 10.3 mg/dL   Total Protein 6.0 (L) 6.5 - 8.1 g/dL   Albumin 3.0 (L) 3.5 - 5.0 g/dL   AST 42 (H) 15 - 41 U/L   ALT 40 0 - 44 U/L   Alkaline Phosphatase 37 (L) 38 - 126 U/L   Total Bilirubin 0.4 0.0 - 1.2 mg/dL   GFR, Estimated 38 (L) >60 mL/min   Anion gap 7 5 - 15  Brain natriuretic peptide     Status: Abnormal   Collection Time: 03/13/24  3:34 PM  Result Value Ref Range   B Natriuretic Peptide 138.6 (H) 0.0 - 100.0 pg/mL  Troponin I (High Sensitivity)     Status: None   Collection Time: 03/13/24  3:34 PM  Result  Value Ref Range   Troponin I (High Sensitivity) 12 <18 ng/L  Blood gas, venous (at [facility] and [facility])     Status: Abnormal   Collection Time: 03/13/24  5:15 PM  Result Value Ref Range   pH, Ven 7.38 7.25 - 7.43   pCO2, Ven 45 44 - 60 mmHg   pO2, Ven <31 (LL) 32 - 45 mmHg   Bicarbonate 26.6 20.0 - 28.0 mmol/L   Acid-Base Excess 1.1 0.0 - 2.0 mmol/L   O2 Saturation 43.1 %   Patient temperature 37.0   Resp panel by RT-PCR (RSV, Flu A&B, Covid) Anterior Nasal Swab     Status: Abnormal   Collection Time: 03/13/24  5:33  PM   Specimen: Anterior Nasal Swab  Result Value Ref Range   SARS Coronavirus 2 by RT PCR POSITIVE (A) NEGATIVE   Influenza A by PCR NEGATIVE NEGATIVE   Influenza B by PCR NEGATIVE NEGATIVE   Resp Syncytial Virus by PCR NEGATIVE NEGATIVE   Basic Metabolic Panel: Recent Labs  Lab 03/13/24 1534  NA 137  K 4.5  CL 105  CO2 25  GLUCOSE 92  BUN 28*  CREATININE 1.38*  CALCIUM 8.4*   Liver Function Tests: Recent Labs  Lab 03/13/24 1534  AST 42*  ALT 40  ALKPHOS 37*  BILITOT 0.4  PROT 6.0*  ALBUMIN 3.0*   No results for input(s): "LIPASE", "AMYLASE" in the last 168 hours. No results for input(s): "AMMONIA" in the last 168 hours. CBC: Recent Labs  Lab 03/13/24 1534  WBC 4.8  NEUTROABS 2.9  HGB 12.2  HCT 37.7  MCV 101.6*  PLT 166   Cardiac Enzymes: Recent Labs  Lab 03/13/24 1534  TROPONINIHS 12    BNP (last 3 results) No results for input(s): "PROBNP" in the last 8760 hours. CBG: No results for input(s): "GLUCAP" in the last 168 hours.  Radiological Exams on Admission:  DG Chest 2 View Result Date: 03/13/2024 CLINICAL DATA:  Shortness of breath.  Fever.  Altered mental status. EXAM: CHEST - 2 VIEW COMPARISON:  12/19/2022. FINDINGS: Low lung volume. Bilateral lung fields are clear. Bilateral costophrenic angles are clear. Stable cardio-mediastinal silhouette. No acute osseous abnormalities. The soft tissues are within normal limits. IMPRESSION: No active cardiopulmonary disease. Electronically Signed   By: Jules Schick M.D.   On: 03/13/2024 16:54     No intake/output data recorded. No intake/output data recorded.        Assessment and Plan: COVID-19 virus infection Remdesirivr and decadron. GI ppx with pantop. Needing 4lpm oxygen  Associated with reactive airway disease findings on exam..  No prior known asthma or COPD.  Patient s/p Solu-Medrol and DuoNeb in the ER.  I will continue with DuoNeb every 4 hourly as well as inhaled corticosteroid and  long-acting beta adrenergic agents.   Med rec pending pharmcy input.  Insulin sliding scle while getting started on steroids.   Advance Care Planning:   Code Status: Prior full code.  Consults: none  Family Communication: per patietn  Severity of Illness: The appropriate patient status for this patient is INPATIENT. Inpatient status is judged to be reasonable and necessary in order to provide the required intensity of service to ensure the patient's safety. The patient's presenting symptoms, physical exam findings, and initial radiographic and laboratory data in the context of their chronic comorbidities is felt to place them at high risk for further clinical deterioration. Furthermore, it is not anticipated that the patient will be medically stable for discharge  from the hospital within 2 midnights of admission.   * I certify that at the point of admission it is my clinical judgment that the patient will require inpatient hospital care spanning beyond 2 midnights from the point of admission due to high intensity of service, high risk for further deterioration and high frequency of surveillance required.*  Author: Nolberto Hanlon, MD 03/13/2024 7:30 PM  For on call review [email or web address].

## 2024-03-13 NOTE — ED Provider Notes (Signed)
 [facility] AT [facility] Provider Note   CSN: [id number] Arrival date & time: 03/13/24  1408     History  Chief Complaint  Patient presents with   Altered Mental Status    Alexis Shelton is a 83 y.o. female.  Patient with history of stroke, hyperlipidemia, hypertension, asthma, CKD presents today with complaints of altered mental status, cough, shortness of breath. Reportedly patient has been more confused over the past few days, however according to family she is at her baseline now. EMS was called for this a few days ago and her oxygen was in the 80s but refused transport. She has had a few fevers at home. She is not on oxygen at home. She does have asthma but does not usually have issues with it, only uses inhalers intermittently and has never been admitted to the hospital for an asthma exacerbation. Notes a productive cough for the past few days. Has sone tightness in her chest but no significant pain. No leg pain or leg swelling, recent travel or surgeries.   The history is provided by the patient. No language interpreter was used.  Altered Mental Status      Home Medications Prior to Admission medications   Medication Sig Start Date End Date Taking? Authorizing Provider  allopurinol (ZYLOPRIM) 100 MG tablet Take 1 tablet (100 mg total) by mouth 2 (two) times daily. 04/10/23   Etta Grandchild, MD  ASPIRIN LOW DOSE 81 MG tablet TAKE 1 TABLET (81 MG TOTAL) BY MOUTH DAILY. SWALLOW WHOLE. 04/02/23   Etta Grandchild, MD  eszopiclone (LUNESTA) 2 MG TABS tablet Take 1 tablet (2 mg total) by mouth at bedtime as needed for sleep. Take immediately before bedtime 02/24/24   Etta Grandchild, MD  Insulin Pen Needle 32G X 6 MM MISC 1 Act by Does not apply route once a week. 12/16/23   Etta Grandchild, MD  levothyroxine (SYNTHROID) 88 MCG tablet Take 1 tablet (88 mcg total) by mouth daily before breakfast. 02/13/24   Etta Grandchild, MD  metoprolol succinate  (TOPROL-XL) 25 MG 24 hr tablet TAKE 1 TABLET (25 MG TOTAL) BY MOUTH DAILY. 02/10/24   Etta Grandchild, MD  modafinil (PROVIGIL) 100 MG tablet Take 1 tablet (100 mg total) by mouth daily. 12/16/23   Etta Grandchild, MD  montelukast (SINGULAIR) 10 MG tablet TAKE 1 TABLET BY MOUTH EVERYDAY AT BEDTIME 02/10/24   Etta Grandchild, MD  Multiple Vitamin (MULTIVITAMIN) tablet Take 1 tablet by mouth daily. 12/25/22   Etta Grandchild, MD  ondansetron (ZOFRAN-ODT) 4 MG disintegrating tablet Take 1 tablet (4 mg total) by mouth every 8 (eight) hours as needed for nausea or vomiting. 06/27/23   Rondel Baton, MD  rosuvastatin (CRESTOR) 20 MG tablet Take 1 tablet (20 mg total) by mouth daily. 03/03/24   Corwin Levins, MD  Semaglutide,0.25 or 0.5MG /DOS, (OZEMPIC, 0.25 OR 0.5 MG/DOSE,) 2 MG/3ML SOPN Take 0.25 mg subcutaneous once weekly 03/03/24   Corwin Levins, MD  sodium zirconium cyclosilicate (LOKELMA) 5 g packet Take 5 g by mouth 2 (two) times daily. 02/26/24   Etta Grandchild, MD  SYMBICORT 160-4.5 MCG/ACT inhaler INHALE 2 PUFFS INTO THE LUNGS DAILY AS NEEDED (SHORTNESS OF BREATH/WHEEZING). 11/06/23   Etta Grandchild, MD  terazosin (HYTRIN) 2 MG capsule Take 1 capsule (2 mg total) by mouth at bedtime. 02/26/24   Etta Grandchild, MD  tiZANidine (ZANAFLEX) 4 MG tablet TAKE 1  TABLET BY MOUTH AT BEDTIME AS NEEDED FOR MUSCLE SPASMS. 01/05/24   Etta Grandchild, MD  traMADol (ULTRAM) 50 MG tablet Take 1 tablet (50 mg total) by mouth every 6 (six) hours as needed. Patient taking differently: Take 50 mg by mouth at bedtime. 04/10/23   Etta Grandchild, MD      Allergies    Patient has no known allergies.    Review of Systems   Review of Systems  HENT:  Positive for congestion.   Respiratory:  Positive for cough, shortness of breath and wheezing.   All other systems reviewed and are negative.   Physical Exam Updated Vital Signs BP (!) 135/52   Pulse (!) 55   Temp 97.9 F (36.6 C) (Oral)   Resp 18   SpO2 96%   Physical Exam Vitals and nursing note reviewed.  Constitutional:      General: She is not in acute distress.    Appearance: Normal appearance. She is normal weight. She is not ill-appearing, toxic-appearing or diaphoretic.  HENT:     Head: Normocephalic and atraumatic.  Cardiovascular:     Rate and Rhythm: Normal rate and regular rhythm.     Heart sounds: Normal heart sounds.  Pulmonary:     Effort: Pulmonary effort is normal. No respiratory distress.     Breath sounds: Wheezing present.     Comments: On 3L Abdominal:     General: Abdomen is flat.     Palpations: Abdomen is soft.     Tenderness: There is no abdominal tenderness.  Musculoskeletal:        General: No tenderness. Normal range of motion.     Cervical back: Normal range of motion.     Right lower leg: No edema.     Left lower leg: No edema.  Skin:    General: Skin is warm and dry.  Neurological:     General: No focal deficit present.     Mental Status: She is alert and oriented to person, place, and time.  Psychiatric:        Mood and Affect: Mood normal.        Behavior: Behavior normal.     ED Results / Procedures / Treatments   Labs (all labs ordered are listed, but only abnormal results are displayed) Labs Reviewed  RESP PANEL BY RT-PCR (RSV, FLU A&B, COVID)  [id number] - Abnormal; Notable for the following components:      Result Value   SARS Coronavirus 2 by RT PCR POSITIVE (*)    All other components within normal limits  CBC WITH DIFFERENTIAL/PLATELET - Abnormal; Notable for the following components:   RBC 3.71 (*)    MCV 101.6 (*)    All other components within normal limits  COMPREHENSIVE METABOLIC PANEL - Abnormal; Notable for the following components:   BUN 28 (*)    Creatinine, Ser 1.38 (*)    Calcium 8.4 (*)    Total Protein 6.0 (*)    Albumin 3.0 (*)    AST 42 (*)    Alkaline Phosphatase 37 (*)    GFR, Estimated 38 (*)    All other components within normal limits  BLOOD GAS, VENOUS -  Abnormal; Notable for the following components:   pO2, Ven <31 (*)    All other components within normal limits  URINALYSIS, ROUTINE W REFLEX MICROSCOPIC  BRAIN NATRIURETIC PEPTIDE  TROPONIN I (HIGH SENSITIVITY)    EKG None  Radiology DG Chest 2 View Result Date: 03/13/2024  CLINICAL DATA:  Shortness of breath.  Fever.  Altered mental status. EXAM: CHEST - 2 VIEW COMPARISON:  12/19/2022. FINDINGS: Low lung volume. Bilateral lung fields are clear. Bilateral costophrenic angles are clear. Stable cardio-mediastinal silhouette. No acute osseous abnormalities. The soft tissues are within normal limits. IMPRESSION: No active cardiopulmonary disease. Electronically Signed   By: Jules Schick M.D.   On: 03/13/2024 16:54    Procedures .Critical Care  Performed by: Silva Bandy, PA-C Authorized by: Silva Bandy, PA-C   Critical care provider statement:    Critical care time (minutes):  35   Critical care was necessary to treat or prevent imminent or life-threatening deterioration of the following conditions:  Respiratory failure   Critical care was time spent personally by me on the following activities:  Development of treatment plan with patient or surrogate, discussions with primary provider, evaluation of patient's response to treatment, examination of patient, obtaining history from patient or surrogate, ordering and review of laboratory studies, ordering and review of radiographic studies, pulse oximetry, re-evaluation of patient's condition and review of old charts   Care discussed with: admitting provider       Medications Ordered in ED Medications  ipratropium-albuterol (DUONEB) 0.5-2.5 (3) MG/3ML nebulizer solution 3 mL (has no administration in time range)  methylPREDNISolone sodium succinate (SOLU-MEDROL) 125 mg/2 mL injection 125 mg (has no administration in time range)    ED Course/ Medical Decision Making/ A&P                                 Medical Decision  Making Amount and/or Complexity of Data Reviewed Labs: ordered. Radiology: ordered.  Risk Prescription drug management.   This patient is a 83 y.o. female who presents to the ED for concern of shortness of breath, this involves an extensive number of treatment options, and is a complaint that carries with it a high risk of complications and morbidity. The emergent differential diagnosis prior to evaluation includes, but is not limited to,  CHF, pericardial effusion/tamponade, arrhythmias, ACS, COPD, asthma, bronchitis, pneumonia, pneumothorax, PE, anemia   This is not an exhaustive differential.   Past Medical History / Co-morbidities / Social History:  has a past medical history of Arthritis, DDD (degenerative disc disease), lumbar (11/24/2018), Dizziness, Dyspnea, Elevated liver function tests, Gallstones, Hearing loss, History of stroke involving cerebellum (10/13/2018), Hypercholesteremia, Hypertension, Hypothyroidism, Idiopathic gout of multiple sites (08/12/2018), Migraines, Mild intermittent asthma without complication (02/11/2019), Primary osteoarthritis of both feet (11/24/2018), Proteinuria, Rheumatoid arthritis (HCC) (07/04/2018), Stage 3 chronic kidney disease (HCC) (02/11/2019), and Vitamin D deficiency (02/11/2019).  Additional history: Chart reviewed.  Physical Exam: Physical exam performed. The pertinent findings include: diffuse inspiratory and expiratory wheezing throughout, on 3 L O2 via [place]   Lab Tests: I ordered, and personally interpreted labs.  The pertinent results include:  no leukocytosis, creatinine 1.38 consistent with previous. COVID positive   Imaging Studies: I ordered imaging studies including DG chest. I independently visualized and interpreted imaging which showed NAD. I agree with the radiologist interpretation.   Cardiac Monitoring:  The patient was maintained on a cardiac monitor.  Cardiac monitor showed an underlying rhythm of: sinus rhythm, no STEMI.  I agree with this interpretation.   Medications: I ordered medication including duoneb x 3, solu-medrol, magnesium  for wheezing. Reevaluation of the patient after these medicines showed that the patient improved. I have reviewed the patients home medicines and have made adjustments  as needed.  Disposition: After consideration of the diagnostic results and the patients response to treatment, I feel that patient will require admission for hypoxic respiratory failure, likely due to covid likely exacerbated by her asthma as well. She is maintaining on 3L O2 via [place], improved slightly with above interventions. Discussed with patient who is understanding and in agreement   Discussed patient with hospitalist who accepts patient for admission.   Final Clinical Impression(s) / ED Diagnoses Final diagnoses:  Acute respiratory failure with hypoxia [facility])  COVID    Rx / DC Orders ED Discharge Orders     None         Alexis Shelton 03/13/24 1909    Lonell Grandchild, MD 03/14/24 1032

## 2024-03-13 NOTE — ED Triage Notes (Signed)
 Patient presented to ER with fever and AMS, Per EMS reports: Friend could not get ahold of her x2 days, patient was seen by EMS 3 days ago-O2 sats mid 80's and fever but refused to go to ER. Patient on 4L O2, does not wear oxygen at home.

## 2024-03-13 NOTE — ED Notes (Signed)
Lab called to add on blood work 

## 2024-03-13 NOTE — Assessment & Plan Note (Addendum)
 Remdesirivr and decadron. GI ppx with pantop. Needing 4lpm oxygen  Associated with reactive airway disease findings on exam..  No prior known asthma or COPD.  Patient s/p Solu-Medrol and DuoNeb in the ER.  I will continue with DuoNeb every 4 hourly as well as inhaled corticosteroid and long-acting beta adrenergic agents.

## 2024-03-14 DIAGNOSIS — R8290 Unspecified abnormal findings in urine: Secondary | ICD-10-CM | POA: Diagnosis not present

## 2024-03-14 DIAGNOSIS — J4520 Mild intermittent asthma, uncomplicated: Secondary | ICD-10-CM | POA: Diagnosis not present

## 2024-03-14 DIAGNOSIS — F419 Anxiety disorder, unspecified: Secondary | ICD-10-CM | POA: Diagnosis not present

## 2024-03-14 DIAGNOSIS — Z79899 Other long term (current) drug therapy: Secondary | ICD-10-CM | POA: Diagnosis not present

## 2024-03-14 DIAGNOSIS — M069 Rheumatoid arthritis, unspecified: Secondary | ICD-10-CM | POA: Diagnosis not present

## 2024-03-14 DIAGNOSIS — Z7951 Long term (current) use of inhaled steroids: Secondary | ICD-10-CM | POA: Diagnosis not present

## 2024-03-14 DIAGNOSIS — Z96653 Presence of artificial knee joint, bilateral: Secondary | ICD-10-CM | POA: Diagnosis not present

## 2024-03-14 DIAGNOSIS — E1122 Type 2 diabetes mellitus with diabetic chronic kidney disease: Secondary | ICD-10-CM | POA: Diagnosis not present

## 2024-03-14 DIAGNOSIS — I160 Hypertensive urgency: Secondary | ICD-10-CM | POA: Diagnosis not present

## 2024-03-14 DIAGNOSIS — G928 Other toxic encephalopathy: Secondary | ICD-10-CM | POA: Diagnosis not present

## 2024-03-14 DIAGNOSIS — I129 Hypertensive chronic kidney disease with stage 1 through stage 4 chronic kidney disease, or unspecified chronic kidney disease: Secondary | ICD-10-CM | POA: Diagnosis not present

## 2024-03-14 DIAGNOSIS — E039 Hypothyroidism, unspecified: Secondary | ICD-10-CM | POA: Diagnosis not present

## 2024-03-14 DIAGNOSIS — Z6831 Body mass index (BMI) 31.0-31.9, adult: Secondary | ICD-10-CM | POA: Diagnosis not present

## 2024-03-14 DIAGNOSIS — J4489 Other specified chronic obstructive pulmonary disease: Secondary | ICD-10-CM | POA: Diagnosis not present

## 2024-03-14 DIAGNOSIS — J9601 Acute respiratory failure with hypoxia: Secondary | ICD-10-CM | POA: Diagnosis not present

## 2024-03-14 DIAGNOSIS — Z87891 Personal history of nicotine dependence: Secondary | ICD-10-CM | POA: Diagnosis not present

## 2024-03-14 DIAGNOSIS — Z8249 Family history of ischemic heart disease and other diseases of the circulatory system: Secondary | ICD-10-CM | POA: Diagnosis not present

## 2024-03-14 DIAGNOSIS — U071 COVID-19: Secondary | ICD-10-CM | POA: Diagnosis present

## 2024-03-14 DIAGNOSIS — E669 Obesity, unspecified: Secondary | ICD-10-CM | POA: Diagnosis not present

## 2024-03-14 DIAGNOSIS — Z79890 Hormone replacement therapy (postmenopausal): Secondary | ICD-10-CM | POA: Diagnosis not present

## 2024-03-14 DIAGNOSIS — Z7982 Long term (current) use of aspirin: Secondary | ICD-10-CM | POA: Diagnosis not present

## 2024-03-14 DIAGNOSIS — T380X5A Adverse effect of glucocorticoids and synthetic analogues, initial encounter: Secondary | ICD-10-CM | POA: Diagnosis not present

## 2024-03-14 DIAGNOSIS — E7800 Pure hypercholesterolemia, unspecified: Secondary | ICD-10-CM | POA: Diagnosis not present

## 2024-03-14 DIAGNOSIS — N1832 Chronic kidney disease, stage 3b: Secondary | ICD-10-CM | POA: Diagnosis not present

## 2024-03-14 LAB — BASIC METABOLIC PANEL
Anion gap: 10 (ref 5–15)
BUN: 27 mg/dL — ABNORMAL HIGH (ref 8–23)
CO2: 22 mmol/L (ref 22–32)
Calcium: 8.9 mg/dL (ref 8.9–10.3)
Chloride: 108 mmol/L (ref 98–111)
Creatinine, Ser: 1.46 mg/dL — ABNORMAL HIGH (ref 0.44–1.00)
GFR, Estimated: 35 mL/min — ABNORMAL LOW (ref >60)
Glucose, Bld: 301 mg/dL — ABNORMAL HIGH (ref 70–99)
Potassium: 4.3 mmol/L (ref 3.5–5.1)
Sodium: 140 mmol/L (ref 135–145)

## 2024-03-14 LAB — CBC
HCT: 40.9 % (ref 36.0–46.0)
Hemoglobin: 13.1 g/dL (ref 12.0–15.0)
MCH: 32.3 pg (ref 26.0–34.0)
MCHC: 32 g/dL (ref 30.0–36.0)
MCV: 100.7 fL — ABNORMAL HIGH (ref 80.0–100.0)
Platelets: 159 10*3/uL (ref 150–400)
RBC: 4.06 MIL/uL (ref 3.87–5.11)
RDW: 15 % (ref 11.5–15.5)
WBC: 3.5 10*3/uL — ABNORMAL LOW (ref 4.0–10.5)
nRBC: 0 % (ref 0.0–0.2)

## 2024-03-14 LAB — URINALYSIS, ROUTINE W REFLEX MICROSCOPIC
Bilirubin Urine: NEGATIVE
Glucose, UA: >=500 mg/dL — AB
Hgb urine dipstick: NEGATIVE
Ketones, ur: NEGATIVE mg/dL
Leukocytes,Ua: NEGATIVE
Nitrite: POSITIVE — AB
Protein, ur: 100 mg/dL — AB
Specific Gravity, Urine: 1.018 (ref 1.005–1.030)
pH: 5 (ref 5.0–8.0)

## 2024-03-14 LAB — PROTIME-INR
INR: 1 (ref 0.8–1.2)
Prothrombin Time: 13.1 s (ref 11.4–15.2)

## 2024-03-14 LAB — GLUCOSE, CAPILLARY
Glucose-Capillary: 164 mg/dL — ABNORMAL HIGH (ref 70–99)
Glucose-Capillary: 130 mg/dL — ABNORMAL HIGH (ref 70–99)
Glucose-Capillary: 128 mg/dL — ABNORMAL HIGH (ref 70–99)

## 2024-03-14 LAB — C-REACTIVE PROTEIN: CRP: 3.1 mg/dL — ABNORMAL HIGH (ref <1.0)

## 2024-03-14 LAB — APTT: aPTT: 27 s (ref 24–36)

## 2024-03-14 LAB — CBG MONITORING, ED: Glucose-Capillary: 169 mg/dL — ABNORMAL HIGH (ref 70–99)

## 2024-03-14 MED ORDER — ENOXAPARIN SODIUM 30 MG/0.3ML IJ SOSY
30.0000 mg | PREFILLED_SYRINGE | INTRAMUSCULAR | Status: DC
  Administered 2024-03-15 – 2024-03-16 (×2): 30 mg via SUBCUTANEOUS
  Filled 2024-03-14 (×2): qty 0.3

## 2024-03-14 MED ORDER — ACETAMINOPHEN 325 MG PO TABS
650.0000 mg | ORAL_TABLET | Freq: Four times a day (QID) | ORAL | Status: DC | PRN
  Administered 2024-03-15: 650 mg via ORAL
  Filled 2024-03-14: qty 2

## 2024-03-14 MED ORDER — POLYETHYLENE GLYCOL 3350 17 G PO PACK: 17.0000 g | PACK | Freq: Every day | ORAL | Status: DC | PRN

## 2024-03-14 MED ORDER — ACETAMINOPHEN 650 MG RE SUPP: 650.0000 mg | Freq: Four times a day (QID) | RECTAL | Status: DC | PRN

## 2024-03-14 MED ORDER — SODIUM CHLORIDE 0.9% FLUSH
3.0000 mL | Freq: Two times a day (BID) | INTRAVENOUS | Status: DC
  Administered 2024-03-14 – 2024-03-16 (×6): 3 mL via INTRAVENOUS

## 2024-03-14 MED ORDER — IPRATROPIUM-ALBUTEROL 0.5-2.5 (3) MG/3ML IN SOLN
3.0000 mL | Freq: Three times a day (TID) | RESPIRATORY_TRACT | Status: DC
  Administered 2024-03-15 – 2024-03-16 (×4): 3 mL via RESPIRATORY_TRACT
  Filled 2024-03-14 (×4): qty 3

## 2024-03-14 MED ORDER — METOPROLOL SUCCINATE ER 25 MG PO TB24
25.0000 mg | ORAL_TABLET | Freq: Every day | ORAL | Status: DC
  Administered 2024-03-14 – 2024-03-16 (×3): 25 mg via ORAL
  Filled 2024-03-14 (×3): qty 1

## 2024-03-14 NOTE — Progress Notes (Signed)
 [facility]  PROGRESS NOTE  Alexis Shelton ZDG:[id number] DOB: [birth year] DOA: 03/13/2024 PCP: Etta Grandchild, MD   Brief HPI:   83 y.o. female with history of stroke, hyperlipidemia, hypertension, asthma, CKD presents today with complaints of altered mental status, cough, shortness of breath. Reportedly patient has been more confused over the past few days, however according to family she is at her baseline now. EMS was called for this a few days ago and her oxygen was in the 80s but refused transport. She has had a few fevers at home. She is not on oxygen at home. She does have asthma but does not usually have issues with it, only uses inhalers intermittently and has never been admitted to the hospital for an asthma exacerbation. Notes a productive cough for the past few days. Has sone tightness in her chest but no significant pain. No leg pain or leg swelling, recent travel or surgeries. Patient offers no new complaints since arrival to the ER as above. Patient has been maintained on 4 L/min of supplementary oxygen and has received DuoNeb therapy as well as Solu-Medrol. Patient is found to be COVID-19 positive and as per exam by the ER provider has bilateral wheezing diffuse diminished air entry.    Assessment/Plan:   COVID-19 virus infection -Started on remdesivir and Decadron -Initially required 4 L/min of oxygen, now weaned off to room air Patient s/p Solu-Medrol and DuoNeb in the ER.  I will continue with DuoNeb every 4 hourly as well as inhaled corticosteroid and long-acting beta adrenergic agents. -Check CRP   Hypertension -Restart metoprolol 25 mg daily  Diabetes mellitus type 2 -Hemoglobin A1c 5.9 insulin sliding scle while getting started on steroids.   Abnormal UA -Urinalysis was abnormal with positive nitrite, many bacteria -Asymptomatic, afebrile -Will continue to monitor -No indication for antibiotics    Medications     dexamethasone  6 mg Oral Q24H    [START ON 03/15/2024] enoxaparin (LOVENOX) injection  30 mg Subcutaneous Q24H   fluticasone furoate-vilanterol  1 puff Inhalation Daily   insulin aspart  0-15 Units Subcutaneous TID WC   insulin aspart  0-5 Units Subcutaneous QHS   ipratropium-albuterol  3 mL Nebulization Q4H   montelukast  10 mg Oral QHS   pantoprazole  40 mg Oral Daily   sodium chloride flush  3 mL Intravenous Q12H     Data Reviewed:   CBG:  Recent Labs  Lab 03/13/24 2254  GLUCAP 286*    SpO2: 95 % O2 Flow Rate (L/min): 4 L/min    Vitals:   03/14/24 0300 03/14/24 0400 03/14/24 0500 03/14/24 0600  BP: (!) 125/42 136/66 122/69 120/74  Pulse: 69 76 69 67  Resp: 18 19 18 20   Temp:   98.2 F (36.8 C)   TempSrc:      SpO2: 96% 99% 96% 95%  Weight:          Data Reviewed:  Basic Metabolic Panel: Recent Labs  Lab 03/13/24 1534 03/14/24 0405  NA 137 140  K 4.5 4.3  CL 105 108  CO2 25 22  GLUCOSE 92 301*  BUN 28* 27*  CREATININE 1.38* 1.46*  CALCIUM 8.4* 8.9    CBC: Recent Labs  Lab 03/13/24 1534 03/14/24 0405  WBC 4.8 3.5*  NEUTROABS 2.9  --   HGB 12.2 13.1  HCT 37.7 40.9  MCV 101.6* 100.7*  PLT 166 159    LFT Recent Labs  Lab 03/13/24 1534  AST 42*  ALT 40  ALKPHOS 37*  BILITOT 0.4  PROT 6.0*  ALBUMIN 3.0*     Antibiotics: Anti-infectives (From admission, onward)    Start     Dose/Rate Route Frequency Ordered Stop   03/14/24 1000  remdesivir 100 mg in sodium chloride 0.9 % 100 mL IVPB       Placed in "Followed by" Linked Group   100 mg 200 mL/hr over 30 Minutes Intravenous Daily 03/13/24 1942 03/16/24 0959   03/13/24 2030  remdesivir 200 mg in sodium chloride 0.9% 250 mL IVPB       Placed in "Followed by" Linked Group   200 mg 580 mL/hr over 30 Minutes Intravenous Once 03/13/24 1942 03/13/24 2107        DVT prophylaxis: Lovenox  Code Status: Full code  Family Communication: No family at bedside   CONSULTS    Subjective   Denies shortness of breath  however requiring 4 L/min of oxygen.   Objective    Physical Examination:   General-appears in no acute distress Heart-S1-S2, regular, no murmur auscultated Lungs-clear to auscultation bilaterally, no wheezing or crackles auscultated Abdomen-soft, nontender, no organomegaly Extremities-no edema in the lower extremities Neuro-alert, oriented x3, no focal deficit noted   Status is: Inpatient:             Meredeth Ide   [facility] If 7PM-7AM, please contact night-coverage at [email or web address], Office  [phone]   03/14/2024, 7:45 AM  LOS: 0 days

## 2024-03-15 DIAGNOSIS — U071 COVID-19: Secondary | ICD-10-CM | POA: Diagnosis not present

## 2024-03-15 LAB — CBC
HCT: 41.5 % (ref 36.0–46.0)
Hemoglobin: 13.3 g/dL (ref 12.0–15.0)
MCH: 31.8 pg (ref 26.0–34.0)
MCHC: 32 g/dL (ref 30.0–36.0)
MCV: 99.3 fL (ref 80.0–100.0)
Platelets: 201 10*3/uL (ref 150–400)
RBC: 4.18 MIL/uL (ref 3.87–5.11)
RDW: 15.4 % (ref 11.5–15.5)
WBC: 12.4 10*3/uL — ABNORMAL HIGH (ref 4.0–10.5)
nRBC: 0 % (ref 0.0–0.2)

## 2024-03-15 LAB — GLUCOSE, CAPILLARY
Glucose-Capillary: 128 mg/dL — ABNORMAL HIGH (ref 70–99)
Glucose-Capillary: 118 mg/dL — ABNORMAL HIGH (ref 70–99)
Glucose-Capillary: 97 mg/dL (ref 70–99)
Glucose-Capillary: 116 mg/dL — ABNORMAL HIGH (ref 70–99)

## 2024-03-15 MED ORDER — SALINE SPRAY 0.65 % NA SOLN: 1.0000 | NASAL | Status: DC | PRN

## 2024-03-15 MED ORDER — LORAZEPAM 0.5 MG PO TABS
0.5000 mg | ORAL_TABLET | Freq: Two times a day (BID) | ORAL | Status: DC | PRN
  Administered 2024-03-15: 0.5 mg via ORAL
  Filled 2024-03-15: qty 1

## 2024-03-15 MED ORDER — HYDRALAZINE HCL 20 MG/ML IJ SOLN
10.0000 mg | Freq: Four times a day (QID) | INTRAMUSCULAR | Status: DC | PRN
  Administered 2024-03-15: 10 mg via INTRAVENOUS
  Filled 2024-03-15: qty 1

## 2024-03-15 MED ORDER — TERAZOSIN HCL 2 MG PO CAPS
2.0000 mg | ORAL_CAPSULE | Freq: Every day | ORAL | Status: DC
  Administered 2024-03-15: 2 mg via ORAL
  Filled 2024-03-15: qty 1

## 2024-03-15 MED ORDER — HYDRALAZINE HCL 20 MG/ML IJ SOLN
5.0000 mg | Freq: Once | INTRAMUSCULAR | Status: AC
  Administered 2024-03-15: 5 mg via INTRAVENOUS
  Filled 2024-03-15: qty 1

## 2024-03-15 NOTE — Progress Notes (Signed)
  Progress Note   Patient: Alexis Shelton ZOX:[id number] DOB: 1941-09-05 DOA: 03/13/2024     1 DOS: the patient was seen and examined on 03/15/2024   Brief hospital course: 83 y.o. female with history of stroke, hyperlipidemia, hypertension, asthma, CKD presents today with complaints of altered mental status, cough, shortness of breath. Reportedly patient has been more confused over the past few days, however according to family she is at her baseline now. EMS was called for this a few days ago and her oxygen was in the 80s but refused transport. She has had a few fevers at home. She is not on oxygen at home.  On presentation, patient was noted to be hypoxic requiring 4 L/min of oxygen.  She was initiated on treatment for COVID with Decadron and remdesivir.  Clinically improved but seems encephalopathic and anxious today.  Assessment and Plan: COVID-19 virus infection with mild hypoxia: Patient required supplemental oxygen with 4LPM.  Currently weaned off oxygen at rest.  However with exertion, patient was reported to have desaturated to 88%.  Currently on remdesivir and Decadron.  Decadron however will be discontinued due to anxiety and medical elevated blood pressure.  Continue as needed bronchodilators and antitussives for supportive management.  Acute encephalopathy: Likely metabolic etiology from effects of Decadron.  Hold Decadron.  Monitor for improvement of her symptoms.  As needed Ativan  Accelerated hypertension: Likely due to anxiety and effects of Decadron.  Patient is on metoprolol succinate and terazosin at home , will be resumed on same.  IV hydralazine as needed.  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  Continue with inhaled corticosteroids/LABA  Hypothyroidism: Continue with Synthroid.  Diabetes mellitus type 2 -Hemoglobin A1c 5.9 insulin sliding scle while getting started on steroids.  Abnormal urinalysis: WBC less than 5.  Nitrite positive.  No  indication for antibiotics at this time.  Patient remains asymptomatic      Subjective: Patient is extremely anxious today.  Wanting to go home.  She is exhibiting signs of confusion.  She also has some markedly elevated blood pressure likely induced by steroids.  On ambulation, she desaturated to 88%.  But remains clinically stable at rest off oxygen.  Physical Exam: Vitals:   03/15/24 1030 03/15/24 1035 03/15/24 1317 03/15/24 1525  BP:   (!) 170/82   Pulse:   90   Resp:   16   Temp:   97.6 F (36.4 C)   TempSrc:   Oral   SpO2: (S) (!) 88% (S) 94% 96% 93%  Weight:      Height:       General: Patient is anxious.  She is alert oriented.  Exhibits some confusion. HEENT: Mucosa moist.  Dentition intact. Neck is supple Chest: Significant for diminished breath sounds at the base of the lungs.  No respiratory distress or use of accessory muscles at this time. Abdomen soft nontender Extremities without pedal edema. Skin negative for any new rash CNS shows no focal deficit. Data Reviewed: Glucose was 118, WBC 12.4, hemoglobin 13.3, hematocrit 41.5, platelet count 201    Chest x-ray reviewed shows no active cardiopulmonary disease.  Family Communication:   Disposition: Status is: Inpatient Remains inpatient appropriate because: Pending improvement of blood pressure and acute confusion  Planned Discharge Destination: Home     Time spent: 30 minutes  Author: Lilia Pro, MD 03/15/2024 4:01 PM  For on call review [email or web address].

## 2024-03-15 NOTE — Plan of Care (Signed)

## 2024-03-15 NOTE — Plan of Care (Signed)
  Problem: Coping: Goal: Psychosocial and spiritual needs will be supported Outcome: Progressing   Problem: Respiratory: Goal: Will maintain a patent airway Outcome: Progressing Goal: Complications related to the disease process, condition or treatment will be avoided or minimized Outcome: Progressing   Problem: Education: Goal: Individualized Educational Video(s) Outcome: Progressing

## 2024-03-16 ENCOUNTER — Ambulatory Visit: Payer: Medicare HMO | Admitting: Psychology

## 2024-03-16 DIAGNOSIS — U071 COVID-19: Secondary | ICD-10-CM | POA: Diagnosis not present

## 2024-03-16 LAB — BASIC METABOLIC PANEL
Anion gap: 9 (ref 5–15)
BUN: 36 mg/dL — ABNORMAL HIGH (ref 8–23)
CO2: 22 mmol/L (ref 22–32)
Calcium: 8.5 mg/dL — ABNORMAL LOW (ref 8.9–10.3)
Chloride: 111 mmol/L (ref 98–111)
Creatinine, Ser: 1.39 mg/dL — ABNORMAL HIGH (ref 0.44–1.00)
GFR, Estimated: 37 mL/min — ABNORMAL LOW (ref >60)
Glucose, Bld: 95 mg/dL (ref 70–99)
Potassium: 4.8 mmol/L (ref 3.5–5.1)
Sodium: 142 mmol/L (ref 135–145)

## 2024-03-16 LAB — CBC
HCT: 39.8 % (ref 36.0–46.0)
Hemoglobin: 12.9 g/dL (ref 12.0–15.0)
MCH: 32.4 pg (ref 26.0–34.0)
MCHC: 32.4 g/dL (ref 30.0–36.0)
MCV: 100 fL (ref 80.0–100.0)
Platelets: 212 10*3/uL (ref 150–400)
RBC: 3.98 MIL/uL (ref 3.87–5.11)
RDW: 15.6 % — ABNORMAL HIGH (ref 11.5–15.5)
WBC: 9 10*3/uL (ref 4.0–10.5)
nRBC: 0 % (ref 0.0–0.2)

## 2024-03-16 LAB — GLUCOSE, CAPILLARY: Glucose-Capillary: 95 mg/dL (ref 70–99)

## 2024-03-16 MED ORDER — LEVOTHYROXINE SODIUM 88 MCG PO TABS: 88.0000 ug | ORAL_TABLET | Freq: Every day | ORAL | Status: DC

## 2024-03-16 NOTE — Plan of Care (Signed)
   Problem: Education: Goal: Knowledge of General Education information will improve Description Including pain rating scale, medication(s)/side effects and non-pharmacologic comfort measures Outcome: Progressing

## 2024-03-16 NOTE — Progress Notes (Signed)
 AVS reviewed w/ pt who verbalized an understanding. No other questions. PIV removed as noted. ED in process of locating pt's shoes & black cane - left in ED per pt. Ride enroute

## 2024-03-16 NOTE — TOC Transition Note (Signed)
 Transition of Care [facility]) - Discharge Note   Patient Details  Name: Alexis Shelton Date MRN: [id number] Date of Birth: 10-Mar-1941  Transition of Care [facility]) CM/SW Contact:  Howell Rucks, RN Phone Number: 03/16/2024, 10:55 AM   Clinical Narrative:  Patient discharged prior to [facility] initial assessment.            Patient Goals and CMS Choice            Discharge Placement                       Discharge Plan and Services Additional resources added to the After Visit Summary for                                       Social Drivers of Health (SDOH) Interventions SDOH Screenings   Food Insecurity: Food Insecurity Present (03/14/2024)  Housing: Low Risk  (03/14/2024)  Transportation Needs: No Transportation Needs (03/14/2024)  Utilities: Not At Risk (03/14/2024)  Alcohol Screen: Low Risk  (06/19/2023)  Depression (PHQ2-9): Low Risk  (03/03/2024)  Financial Resource Strain: Medium Risk (12/13/2023)  Physical Activity: Insufficiently Active (12/13/2023)  Social Connections: Socially Isolated (03/14/2024)  Stress: Stress Concern Present (12/13/2023)  Tobacco Use: Medium Risk (03/13/2024)     Readmission Risk Interventions     No data to display

## 2024-03-16 NOTE — Discharge Summary (Signed)
 Alexis Shelton WUJ:[id number] DOB: 22-Aug-1941 DOA: 03/13/2024  PCP: Etta Grandchild, MD  Admit date: 03/13/2024 Discharge date: 03/16/2024  Time spent: 35 minutes  Recommendations for Outpatient Follow-up:  Pcp f/u     Discharge Diagnoses:  Principal Problem:   COVID Active Problems:   Essential hypertension   Asthma   Hypothyroidism   History of CVA (cerebrovascular accident)   CKD stage 3b, GFR 30-44 ml/min (HCC)   Obesity   COVID-19 virus infection   Discharge Condition: improved  Diet recommendation: heart healthy  Filed Weights   03/14/24 0000  Weight: 77.4 kg    History of present illness:  From admission h and p Alexis Shelton is a 83 y.o. female with medical history significant of HTN, HLD and pre -diabetes, anxiety depression.   There is no report of patient having COPD or prior tobacco use.  Patient was in her usual state of health till approximately 3 days ago when she reports a simultaneous onset of cough which was initially dry but since then productive of scant mucus, patient has not noted the color of this.  This was associated with sensation of shortness of breath.  Profound weakness and inability to perform activities of daily living.  Patient reports a Tmax of 100 F at home.  No leg swelling chest pain or cramping.  Patient actually had EMS called to her house approximately 2 days ago.  As per report given to me verbally, patient was apparently hypoxic back then but refused to come to the ER.  Patient does not use supplementary oxygen at home.  Subsequently patient's family member recalled EMS today, patient was hypoxic, requiring 4 L/min of supplementary oxygen.  Brought to [facility] long ER.   Patient offers no new complaints since arrival to the ER as above.  Patient has been maintained on 4 L/min of supplementary oxygen and has received DuoNeb therapy as well as Solu-Medrol.  Patient is found to be COVID-19 positive and as per exam by the ER  provider has bilateral wheezing diffuse diminished air entry.  Hospital Course:   Patient presents with cough, found to have covid infection with acute hypoxic respiratory failure. She was treated with decadron and remdesevir and oxygen. Patient also with encephalopathy and hypertensive urgency. Decadron was stopped with concern it was contributing to confusion. Remdesevir also stopped. By day of discharge bp much improved, encephalopathy appears resolved, patient reports her symptoms are much improved, and she is weaned off oxygen ambulating without assistance. She has f/u with her PCP this afternoon. Advise she make that close f/u, will need ongoing attention to blood pressure at PCP f/u.   Procedures: none   Consultations: none  Discharge Exam: Vitals:   03/16/24 0830 03/16/24 0835  BP:    Pulse:    Resp:    Temp:    SpO2: 94% 94%    General: NAD Cardiovascular: RRR Respiratory: Few scattered rhonchi otherwise clear, normal wob no tachypnea  Discharge Instructions   Discharge Instructions     Diet - low sodium heart healthy   Complete by: As directed    Increase activity slowly   Complete by: As directed       Allergies as of 03/16/2024   No Known Allergies      Medication List     TAKE these medications    allopurinol 100 MG tablet Commonly known as: ZYLOPRIM Take 1 tablet (100 mg total) by mouth 2 (two) times daily.   Aspirin Low Dose 81  MG tablet Generic drug: aspirin EC TAKE 1 TABLET (81 MG TOTAL) BY MOUTH DAILY. SWALLOW WHOLE.   eszopiclone 2 MG Tabs tablet Commonly known as: LUNESTA Take 1 tablet (2 mg total) by mouth at bedtime as needed for sleep. Take immediately before bedtime   Insulin Pen Needle 32G X 6 MM Misc 1 Act by Does not apply route once a week.   levothyroxine 88 MCG tablet Commonly known as: SYNTHROID Take 1 tablet (88 mcg total) by mouth daily before breakfast.   Lokelma 5 g packet Generic drug: sodium zirconium  cyclosilicate Take 5 g by mouth 2 (two) times daily.   metoprolol succinate 25 MG 24 hr tablet Commonly known as: TOPROL-XL TAKE 1 TABLET (25 MG TOTAL) BY MOUTH DAILY.   modafinil 100 MG tablet Commonly known as: PROVIGIL Take 1 tablet (100 mg total) by mouth daily.   montelukast 10 MG tablet Commonly known as: SINGULAIR TAKE 1 TABLET BY MOUTH EVERYDAY AT BEDTIME   multivitamin tablet Take 1 tablet by mouth daily.   ondansetron 4 MG disintegrating tablet Commonly known as: ZOFRAN-ODT Take 1 tablet (4 mg total) by mouth every 8 (eight) hours as needed for nausea or vomiting.   Ozempic (0.25 or 0.5 MG/DOSE) 2 MG/3ML Sopn Generic drug: Semaglutide(0.25 or 0.5MG /DOS) Take 0.25 mg subcutaneous once weekly   rosuvastatin 20 MG tablet Commonly known as: Crestor Take 1 tablet (20 mg total) by mouth daily.   Symbicort 160-4.5 MCG/ACT inhaler Generic drug: budesonide-formoterol INHALE 2 PUFFS INTO THE LUNGS DAILY AS NEEDED (SHORTNESS OF BREATH/WHEEZING).   terazosin 2 MG capsule Commonly known as: HYTRIN Take 1 capsule (2 mg total) by mouth at bedtime.   tiZANidine 4 MG tablet Commonly known as: ZANAFLEX TAKE 1 TABLET BY MOUTH AT BEDTIME AS NEEDED FOR MUSCLE SPASMS.   traMADol 50 MG tablet Commonly known as: ULTRAM Take 1 tablet (50 mg total) by mouth every 6 (six) hours as needed. What changed: when to take this       No Known Allergies  Follow-up Information     Etta Grandchild, MD Follow up.   Specialty: Internal Medicine Contact information: [address] [place] [address] [phone]                  The results of significant diagnostics from this hospitalization (including imaging, microbiology, ancillary and laboratory) are listed below for reference.    Significant Diagnostic Studies: DG Chest 2 View Result Date: 03/13/2024 CLINICAL DATA:  Shortness of breath.  Fever.  Altered mental status. EXAM: CHEST - 2 VIEW COMPARISON:   12/19/2022. FINDINGS: Low lung volume. Bilateral lung fields are clear. Bilateral costophrenic angles are clear. Stable cardio-mediastinal silhouette. No acute osseous abnormalities. The soft tissues are within normal limits. IMPRESSION: No active cardiopulmonary disease. Electronically Signed   By: Jules Schick M.D.   On: 03/13/2024 16:54    Microbiology: Recent Results (from the past 240 hours)  Resp panel by RT-PCR (RSV, Flu A&B, Covid) Anterior Nasal Swab     Status: Abnormal   Collection Time: 03/13/24  5:33 PM   Specimen: Anterior Nasal Swab  Result Value Ref Range Status   SARS Coronavirus 2 by RT PCR POSITIVE (A) NEGATIVE Final    Comment: (NOTE) SARS-CoV-2 target nucleic acids are DETECTED.  The SARS-CoV-2 RNA is generally detectable in upper respiratory specimens during the acute phase of infection. Positive results are indicative of the presence of the identified virus, but do not rule out bacterial infection  or co-infection with other pathogens not detected by the test. Clinical correlation with patient history and other diagnostic information is necessary to determine patient infection status. The expected result is Negative.  Fact Sheet for Patients: BloggerCourse.com  Fact Sheet for Healthcare Providers: SeriousBroker.it  This test is not yet approved or cleared by the [place] FDA and  has been authorized for detection and/or diagnosis of SARS-CoV-2 by FDA under an Emergency Use Authorization (EUA).  This EUA will remain in effect (meaning this test can be used) for the duration of  the COVID-19 declaration under Section 564(b)(1) of the A ct, 21 U.S.C. section 360bbb-3(b)(1), unless the authorization is terminated or revoked sooner.     Influenza A by PCR NEGATIVE NEGATIVE Final   Influenza B by PCR NEGATIVE NEGATIVE Final    Comment: (NOTE) The Xpert Xpress SARS-CoV-2/FLU/RSV plus assay is intended as an  aid in the diagnosis of influenza from Nasopharyngeal swab specimens and should not be used as a sole basis for treatment. Nasal washings and aspirates are unacceptable for Xpert Xpress SARS-CoV-2/FLU/RSV testing.  Fact Sheet for Patients: BloggerCourse.com  Fact Sheet for Healthcare Providers: SeriousBroker.it  This test is not yet approved or cleared by the [place] FDA and has been authorized for detection and/or diagnosis of SARS-CoV-2 by FDA under an Emergency Use Authorization (EUA). This EUA will remain in effect (meaning this test can be used) for the duration of the COVID-19 declaration under Section 564(b)(1) of the Act, 21 U.S.C. section 360bbb-3(b)(1), unless the authorization is terminated or revoked.     Resp Syncytial Virus by PCR NEGATIVE NEGATIVE Final    Comment: (NOTE) Fact Sheet for Patients: BloggerCourse.com  Fact Sheet for Healthcare Providers: SeriousBroker.it  This test is not yet approved or cleared by the [place] FDA and has been authorized for detection and/or diagnosis of SARS-CoV-2 by FDA under an Emergency Use Authorization (EUA). This EUA will remain in effect (meaning this test can be used) for the duration of the COVID-19 declaration under Section 564(b)(1) of the Act, 21 U.S.C. section 360bbb-3(b)(1), unless the authorization is terminated or revoked.  Performed at [facility], [address] [place], [place] [address]      Labs: Basic Metabolic Panel: Recent Labs  Lab 03/13/24 1534 03/14/24 0405 03/16/24 0441  NA 137 140 142  K 4.5 4.3 4.8  CL 105 108 111  CO2 25 22 22   GLUCOSE 92 301* 95  BUN 28* 27* 36*  CREATININE 1.38* 1.46* 1.39*  CALCIUM 8.4* 8.9 8.5*   Liver Function Tests: Recent Labs  Lab 03/13/24 1534  AST 42*  ALT 40  ALKPHOS 37*  BILITOT 0.4  PROT 6.0*  ALBUMIN 3.0*   No  results for input(s): "LIPASE", "AMYLASE" in the last 168 hours. No results for input(s): "AMMONIA" in the last 168 hours. CBC: Recent Labs  Lab 03/13/24 1534 03/14/24 0405 03/15/24 0507 03/16/24 0441  WBC 4.8 3.5* 12.4* 9.0  NEUTROABS 2.9  --   --   --   HGB 12.2 13.1 13.3 12.9  HCT 37.7 40.9 41.5 39.8  MCV 101.6* 100.7* 99.3 100.0  PLT 166 159 201 212   Cardiac Enzymes: No results for input(s): "CKTOTAL", "CKMB", "CKMBINDEX", "TROPONINI" in the last 168 hours. BNP: BNP (last 3 results) Recent Labs    03/13/24 1534  BNP 138.6*    ProBNP (last 3 results) No results for input(s): "PROBNP" in the last 8760 hours.  CBG: Recent Labs  Lab 03/15/24  0802 03/15/24 1157 03/15/24 1721 03/15/24 2123 03/16/24 0738  GLUCAP 128* 118* 97 116* 95       Signed:  Silvano Bilis MD.  [facility] 03/16/2024, 8:41 AM

## 2024-03-16 NOTE — Progress Notes (Unsigned)
 03/16/2024 Name: Alexis Shelton MRN: [id number] DOB: 01-05-1941  No chief complaint on file.   Alexis Shelton is a 83 y.o. year old female who was referred for medication management by their primary care provider, Etta Grandchild, MD.    They were referred to the pharmacist by their PCP for assistance in managing hypertension and CKD    Subjective:  Care Team: Primary Care Provider: Etta Grandchild, MD ; Next Scheduled Visit: 06/03/2024   Medication Access/Adherence  Current Pharmacy:  [id number] - [place], [place] - [address] AT [address] [facility] [address] 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 - PCP started ozempic = Pt states that provider has not decided what they want to do with this therapy for her  > medicare doesn't cover pa for wt loss for glp1 inc rosuv to 20mg  - lokelma long term?   Pre-diabetes/Obesity: First just wanted to talk with u regarding one request u had for ozempic   Ozempic is just for dm and insurance doesn't cover for nondm purposes - wegovy for wt loss but medicare doesn't cover these injections for wt loss   Oz - started in jan - picked up??ins changed - ? Has lost some wt  > her ins humana apparently covers oz by April 1st  - send PA to  new ins - was previously [place] >>> coverage is for DM pts  A1C = 5.9 3/5; <7% over 4 years     Current medications: Ozempic 0.25mg - started 2024/03/26? Medications tried in the past:   Current glucose readings: *** Using *** meter; testing *** times daily  Patient {Actions; denies-reports:120008} hypoglycemic s/sx including ***dizziness, shakiness, sweating.  Patient {Actions; denies-reports:120008} hyperglycemic symptoms including ***polyuria, polydipsia, polyphagia, nocturia, neuropathy, blurred vision.  Current meal patterns:  - Breakfast: *** - Lunch *** - Supper *** - Snacks *** - Drinks ***  Current physical activity: ***  Current medication access support: ***    In terms of ur kidney function, I saw that Dr. Yetta Barre sent in a referral to a kidney doctor - have u heard anything  - they will let u know if u need different types of meds to protect ur kidneys and how best to proceed w/ ur potassium   Hypertension: Home BP logs? If no logs, bring to next visit w/ BP cuff Go over BP goals  Dizziness, headaches, blurred vision? History of swelling? May not add lisinop dt concerns with K Bps 3/21-24 from inpt -SBP 130-150s   Current medications: metoprolol succinate 25mg  daily, terazosin 2mg  nightly Medications previously tried: losartan, amlodipine (made her feel ill)  Patient {HAS/DOES NOT ONGE:95284} a validated, automated, upper arm home BP cuff Current blood pressure readings readings: ***  Patient {Actions; denies-reports:120008} hypotensive s/sx including ***dizziness, lightheadedness.  Patient {Actions; denies-reports:120008} hypertensive symptoms including ***headache,  chest pain, shortness of breath     Hyperlipidemia/ASCVD Risk Reduction Last lipid panel 07/2023; LDL 117 TRG <150  Go over labs and goals, <70 - the inc in rosuv will help   Compliance? Muscle pain? Due for lipid panel - will likely get 6/11  Current lipid lowering medications:  rosuvastatin 20mg  daily Medications tried in the past: atorvastatin  Antiplatelet regimen: none    Objective: BP Readings from Last 2 Encounters:  03/16/24 (!) 158/67  03/03/24 122/78     Lab Results  Component Value Date   HGBA1C 5.9 02/24/2024    Lab Results  Component Value Date   CREATININE 1.39 (H) 03/16/2024   BUN 36 (H) 03/16/2024   NA 142 03/16/2024   K 4.8 03/16/2024   CL 111 03/16/2024   CO2 22 03/16/2024    Lab Results  Component Value Date   CHOL 194 08/07/2023   HDL 51.40 08/07/2023   LDLCALC 117 (H) 08/07/2023   LDLDIRECT 175.0 06/06/2022   TRIG 125.0 08/07/2023   CHOLHDL 4 08/07/2023    Medications Reviewed Today   Medications were not reviewed in this encounter       Assessment/Plan:   Pre-Diabetes/Obesity: - Currently {CHL Controlled/Uncontrolled:9804133387} - Reviewed long term cardiovascular and renal outcomes of uncontrolled blood sugar - Reviewed goal A1c, goal fasting, and goal 2 hour post prandial glucose - Reviewed dietary modifications including *** - Reviewed lifestyle modifications including:  - Recommend to ***  - Patient denies personal or family history of multiple endocrine neoplasia type 2, medullary thyroid cancer; personal history of pancreatitis or gallbladder disease. - Recommend to check glucose *** - Meets financial criteria for *** patient assistance program through ***. Will collaborate with provider, CPhT, and patient to pursue assistance.  -   Hypertension: - Currently {CHL Controlled/Uncontrolled:9804133387}, BP goal <130/80 - Reviewed long term cardiovascular and renal outcomes of uncontrolled blood pressure - Reviewed appropriate blood pressure monitoring technique and reviewed goal blood pressure. Recommended to check home blood pressure and heart rate *** - Recommend to ***      Hyperlipidemia/ASCVD Risk Reduction: - Currently {CHL Controlled/Uncontrolled:9804133387}, LDL goal <70 - Reviewed long  term complications of uncontrolled cholesterol - Reviewed dietary recommendations including *** - Reviewed lifestyle recommendations including *** - Recommend to ***  - Meets financial criteria for *** patient assistance program through ***. Will collaborate with provider, CPhT, and patient to pursue assistance.    Follow Up Plan: ***  Verdene Rio, PharmD PGY1 Pharmacy Resident

## 2024-03-17 ENCOUNTER — Ambulatory Visit: Payer: Medicare HMO | Admitting: Internal Medicine

## 2024-03-17 ENCOUNTER — Telehealth: Payer: Self-pay

## 2024-03-17 ENCOUNTER — Other Ambulatory Visit

## 2024-03-17 ENCOUNTER — Telehealth: Payer: Self-pay | Admitting: *Deleted

## 2024-03-17 NOTE — Transitions of Care (Post Inpatient/ED Visit) (Signed)
   03/17/2024  Name: Alexis Shelton MRN: [id number] DOB: 13-Apr-1941  Today's TOC FU Call Status: Today's TOC FU Call Status:: Unsuccessful Call (1st Attempt) Unsuccessful Call (1st Attempt) Date: 03/17/24  Attempted to reach the patient regarding the most recent Inpatient visit; left HIPAA compliant voice message requesting call back  Follow Up Plan: Additional outreach attempts will be made to reach the patient to complete the Transitions of Care (Post Inpatient visit) call.   Pls call/ message for questions,  Caryl Pina, RN, BSN, CCRN Alumnus RN Care Manager  Transitions of Care  VBCI - [facility] [phone]: direct office

## 2024-03-17 NOTE — Telephone Encounter (Signed)
 Tried to reach patient for pharmacist appointment today. Left voicemails with call back number.  Of note, patient was discharged from the hospital yesterday (3/24).  Verdene Rio, PharmD PGY1 Pharmacy Resident

## 2024-03-18 ENCOUNTER — Telehealth: Payer: Self-pay | Admitting: *Deleted

## 2024-03-18 NOTE — Transitions of Care (Post Inpatient/ED Visit) (Signed)
 03/18/2024  Name: Alexis Shelton MRN: [id number] DOB: May 18, 1941  Today's TOC FU Call Status: Today's TOC FU Call Status:: Successful TOC FU Call Completed TOC FU Call Complete Date: 03/18/24 Patient's Name and Date of Birth confirmed.  Transition Care Management Follow-up Telephone Call Date of Discharge: 03/16/24 Discharge Facility: [facility]) Type of Discharge: Inpatient Admission Primary Inpatient Discharge Diagnosis:: Acute respiratory failure secondary to COVID-19 infection How have you been since you were released from the hospital?: Better ("I am fine now-- felt terrible yesterday, am fine today, doing laundry and cooking.  I am completely independent and thinking about moving into a ILF, but I am fine.  I don't need weekly calls- I get enough calls as it is, I will call you if I need you") Any questions or concerns?: No  Items Reviewed: Did you receive and understand the discharge instructions provided?: Yes (thoroughly reviewed with patient who verbalizes good understanding of same) Medications obtained,verified, and reconciled?: Yes (Medications Reviewed) (Full medication reconciliation/ review completed; no concerns or discrepancies identified; confirmed patient obtained/ is taking all newly Rx'd medications as instructed; self-manages medications and denies questions/ concerns around medications today) Any new allergies since your discharge?: No Dietary orders reviewed?: Yes Type of Diet Ordered:: "Healthy-- try to follow kidney diet" Do you have support at home?: Yes People in Home: alone Name of Support/Comfort Primary Source: Reports independent in self-care activities; supportive friends assists as/ if needed/ indicated  Medications Reviewed Today: Medications Reviewed Today     Reviewed by Michaela Corner, RN (Registered Nurse) on 03/18/24 at 1523  Med List Status: <None>   Medication Order Taking? Sig Documenting Provider Last Dose Status Informant   allopurinol (ZYLOPRIM) 100 MG tablet [id number] Yes Take 1 tablet (100 mg total) by mouth 2 (two) times daily. Etta Grandchild, MD Taking Active   ASPIRIN LOW DOSE 81 MG tablet [id number] Yes TAKE 1 TABLET (81 MG TOTAL) BY MOUTH DAILY. SWALLOW WHOLE. Etta Grandchild, MD Taking Active   eszopiclone Alfonso Patten) 2 MG TABS tablet [id number] Yes Take 1 tablet (2 mg total) by mouth at bedtime as needed for sleep. Take immediately before bedtime Etta Grandchild, MD Taking Active            Med Note Otelia Limes Mar 18, 2024  3:02 PM) 03/18/24: Reports during [facility] call has not needed recently  Insulin Pen Needle 32G X 6 MM MISC [id number]  1 Act by Does not apply route once a week. Etta Grandchild, MD  Active   levothyroxine (SYNTHROID) 88 MCG tablet [id number] Yes Take 1 tablet (88 mcg total) by mouth daily before breakfast. Etta Grandchild, MD Taking Active   metoprolol succinate (TOPROL-XL) 25 MG 24 hr tablet [id number] Yes TAKE 1 TABLET (25 MG TOTAL) BY MOUTH DAILY. Etta Grandchild, MD Taking Active   modafinil (PROVIGIL) 100 MG tablet [id number] Yes Take 1 tablet (100 mg total) by mouth daily. Etta Grandchild, MD Taking Active   montelukast (SINGULAIR) 10 MG tablet [id number] Yes TAKE 1 TABLET BY MOUTH EVERYDAY AT BEDTIME Etta Grandchild, MD Taking Active   Multiple Vitamin (MULTIVITAMIN) tablet [id number] Yes Take 1 tablet by mouth daily. Etta Grandchild, MD Taking Active   ondansetron (ZOFRAN-ODT) 4 MG disintegrating tablet [id number] Yes Take 1 tablet (4 mg total) by mouth every 8 (eight) hours as needed for nausea or vomiting. Rondel Baton, MD Taking Active  Med Note Michaela Corner   Wed Mar 18, 2024  3:05 PM) 03/18/24: Reports during TOC call has not needed recently  rosuvastatin (CRESTOR) 20 MG tablet [id number] Yes Take 1 tablet (20 mg total) by mouth daily. Corwin Levins, MD Taking Active   Semaglutide,0.25 or 0.5MG /DOS, (OZEMPIC, 0.25 OR 0.5 MG/DOSE,) 2 MG/3ML Namon Cirri [id number] Yes  Take 0.25 mg subcutaneous once weekly Corwin Levins, MD Taking Active            Med Note Berneta Levins, Shan Levans Mar 18, 2024  3:06 PM) 03/18/24: reports during [facility] call, not currently taking- due to insurance coverage-- reports plans to resume once "new insurance kicks in" oin 03/24/24- reports "PCP "aware"   sodium zirconium cyclosilicate (LOKELMA) 5 g packet [id number] No Take 5 g by mouth 2 (two) times daily.  Patient not taking: Reported on 03/18/2024   Etta Grandchild, MD Not Taking Active            Med Note Otelia Limes Mar 18, 2024  3:04 PM) 03/18/24: Reports during [facility] call- she has stopped taking "for now" due to cost- has "some at my home;" reports wants to discuss with PCP before she decides whether to continue taking  SYMBICORT 160-4.5 MCG/ACT inhaler [id number] Yes INHALE 2 PUFFS INTO THE LUNGS DAILY AS NEEDED (SHORTNESS OF BREATH/WHEEZING). Etta Grandchild, MD Taking Active   terazosin (HYTRIN) 2 MG capsule [id number] Yes Take 1 capsule (2 mg total) by mouth at bedtime. Etta Grandchild, MD Taking Active            Med Note Otelia Limes Mar 18, 2024  3:08 PM) 03/18/24:  Reports during TOC call- she has now started taking as prescribed   tiZANidine (ZANAFLEX) 4 MG tablet [id number] Yes TAKE 1 TABLET BY MOUTH AT BEDTIME AS NEEDED FOR MUSCLE SPASMS. Etta Grandchild, MD Taking Active   traMADol Janean Sark) 50 MG tablet [id number] Yes Take 1 tablet (50 mg total) by mouth every 6 (six) hours as needed.  Patient taking differently: Take 50 mg by mouth at bedtime.   Etta Grandchild, MD Taking Active            Home Care and Equipment/Supplies: Were Home Health Services Ordered?: No Any new equipment or medical supplies ordered?: No  Functional Questionnaire: Do you need assistance with bathing/showering or dressing?: No Do you need assistance with meal preparation?: No Do you need assistance with eating?: No Do you have difficulty maintaining continence: No Do you  need assistance with getting out of bed/getting out of a chair/moving?: No Do you have difficulty managing or taking your medications?: No  Follow up appointments reviewed: PCP Follow-up appointment confirmed?: Yes (care coordination outreach in real-time with scheduling care guide to successfully schedule hospital follow up PCP appointment 04/15/24- confirmed this is first available appointment available) Date of PCP follow-up appointment?: 04/15/24 Follow-up Provider: PCP- Dr. Yetta Barre Specialist [facility] Follow-up appointment confirmed?: NA Do you need transportation to your follow-up appointment?: No Do you understand care options if your condition(s) worsen?: Yes-patient verbalized understanding  SDOH Interventions Today    Flowsheet Row Most Recent Value  SDOH Interventions   Food Insecurity Interventions Intervention Not Indicated  [Denies food insecurity today]  Housing Interventions Intervention Not Indicated  Transportation Interventions Intervention Not Indicated  [drives self]  Utilities Interventions Intervention Not Indicated      Interventions Today    Flowsheet Row Most Recent Value  Chronic Disease   Chronic disease during today's visit Other  [COVID 19]  General Interventions   General Interventions Discussed/Reviewed General Interventions Discussed, Durable Medical Equipment (DME), Doctor Visits  Doctor Visits Discussed/Reviewed Doctor Visits Discussed, PCP  Durable Medical Equipment (DME) Other  [confirmed uses assistive devices on regular basis, at baseline -- cane prn: "mostly when I leave the house"]  PCP/Specialist Visits Compliance with follow-up visit  Education Interventions   Education Provided Provided Education  Provided Verbal Education On When to see the doctor, Medication  Nutrition Interventions   Nutrition Discussed/Reviewed Nutrition Discussed  Pharmacy Interventions   Pharmacy Dicussed/Reviewed Pharmacy Topics Discussed  [Full medication review  with updating medication list in EHR per patient report]  Safety Interventions   Safety Discussed/Reviewed Safety Discussed, Fall Risk  [provided education/ reinforcement around fall prevention]      TOC Interventions Today    Flowsheet Row Most Recent Value  TOC Interventions   TOC Interventions Discussed/Reviewed TOC Interventions Discussed  [Patient declines need for ongoing/ further care management outreach,  declines enrollment in 30-day TOC program,  provided my direct contact information should questions/ concerns/ needs arise post-TOC call,  HFU appt- 30 days out]      Total time spent from review to signing of note/ including any care coordination interventions:  50 minutes:  patient very indecisive about scheduling hospital follow up PCP provider appointment: no appointments available for 30 days out, per scheduling care guide report  Pls call/ message for questions,  Caryl Pina, RN, BSN, Media planner  Transitions of Care  [facility] - [facility] [phone]: direct office

## 2024-03-19 ENCOUNTER — Telehealth: Payer: Self-pay

## 2024-03-19 NOTE — Telephone Encounter (Signed)
 Patient called, recording call could not be completed as dialed. Routing to the office.  Copied from CRM [id number]. Topic: Clinical - Medical Advice >> Mar 19, 2024  3:26 PM Saverio Danker wrote: Reason for CRM: The patient is a little irate when calling in because she has recently left the hospital for care. She stated she also has kidney disease and would like to know if she should resume her medication.

## 2024-03-19 NOTE — Telephone Encounter (Signed)
**Note De-identified  [place] Obfuscation** Please advise 

## 2024-03-20 NOTE — Telephone Encounter (Signed)
 Unable to reach patient. LMTRC

## 2024-03-23 ENCOUNTER — Telehealth: Payer: Self-pay | Admitting: *Deleted

## 2024-03-23 NOTE — Progress Notes (Unsigned)
 Complex Care Management Care Guide Note  03/23/2024 Name: Alexis Shelton MRN: [id number] DOB: January 25, 1941  Alexis Shelton is a 83 y.o. year old female who is a primary care patient of Etta Grandchild, MD and is actively engaged with the care management team. I reached out to [name] by phone today to assist with re-scheduling  with the Pharmacist.  Follow up plan: Unsuccessful telephone outreach attempt made. A HIPAA compliant phone message was left for the patient providing contact information and requesting a return call.  [name], CMA [place]  [facility] Guide Direct Dial: [phone]  Fax: [phone] Website: [email or web address]

## 2024-03-24 NOTE — Progress Notes (Signed)
 Complex Care Management Care Guide Note  03/24/2024 Name: Shyler Hamill MRN: [id number] DOB: 24-Mar-1941  Joi Leyva is a 83 y.o. year old female who is a primary care patient of Etta Grandchild, MD and is actively engaged with the care management team. I reached out to [name] by phone today to assist with re-scheduling  with the Pharmacist.  Follow up plan: Unsuccessful telephone outreach attempt made. A HIPAA compliant phone message was left for the patient providing contact information and requesting a return call. No further outreach attempts will be made due to inability to maintain patient contact.   [name], CMA [place]  [facility], [facility] Guide Direct Dial: [phone]  Fax: [phone] Website: [email or web address]

## 2024-03-28 ENCOUNTER — Other Ambulatory Visit: Payer: Self-pay | Admitting: Internal Medicine

## 2024-04-02 ENCOUNTER — Other Ambulatory Visit: Payer: Self-pay

## 2024-04-02 NOTE — Telephone Encounter (Signed)
 Trying to reach this patient has been unsuccessful for the last 2 weeks. I have left multiple messages. A few messages I even forgot to document. I just called again today. Unable to reach patient [facility].. Going to close this encounter out, if she calls back please open a new encounter or try to get in contact with me so I can speak with her.

## 2024-04-03 ENCOUNTER — Other Ambulatory Visit: Payer: Self-pay | Admitting: Internal Medicine

## 2024-04-03 DIAGNOSIS — E875 Hyperkalemia: Secondary | ICD-10-CM

## 2024-04-03 DIAGNOSIS — N184 Chronic kidney disease, stage 4 (severe): Secondary | ICD-10-CM

## 2024-04-06 ENCOUNTER — Other Ambulatory Visit: Payer: Self-pay | Admitting: Internal Medicine

## 2024-04-06 DIAGNOSIS — M5030 Other cervical disc degeneration, unspecified cervical region: Secondary | ICD-10-CM

## 2024-04-06 DIAGNOSIS — M19041 Primary osteoarthritis, right hand: Secondary | ICD-10-CM

## 2024-04-06 DIAGNOSIS — M51369 Other intervertebral disc degeneration, lumbar region without mention of lumbar back pain or lower extremity pain: Secondary | ICD-10-CM

## 2024-04-07 MED ORDER — OZEMPIC (0.25 OR 0.5 MG/DOSE) 2 MG/3ML ~~LOC~~ SOPN: 0.5000 mg | PEN_INJECTOR | SUBCUTANEOUS | 0 refills | Status: DC

## 2024-04-14 ENCOUNTER — Ambulatory Visit (INDEPENDENT_AMBULATORY_CARE_PROVIDER_SITE_OTHER): Admitting: Psychology

## 2024-04-14 DIAGNOSIS — F3289 Other specified depressive episodes: Secondary | ICD-10-CM

## 2024-04-14 NOTE — Progress Notes (Signed)
 [facility] Counselor/Therapist Progress Note  Patient ID: Alexis Shelton, MRN: [id number]    Date: 04/14/24  Time Spent: 2:03 pm - 2:55 pm : 52  Minutes  Treatment Type: Individual Therapy.  Reported Symptoms: anxiety and depression.   Mental Status Exam: Appearance:  Neat and Well Groomed     Behavior: Appropriate  Motor: Normal  Speech/Language:  Clear and Coherent  Affect: Congruent  Mood: anxious and dysthymic  Thought process: normal  Thought content:   WNL  Sensory/Perceptual disturbances:   WNL  Orientation: oriented to person, place, time/date, and situation  Attention: Good  Concentration: Good  Memory: WNL  Fund of knowledge:  Good  Insight:   Good  Judgment:  Good  Impulse Control: Good   Risk Assessment: Danger to Self:  No Self-injurious Behavior: No Danger to Others: No Duty to Warn:no Physical Aggression / Violence:No  Access to Firearms a concern: No  Gang Involvement:No   Subjective:   Alexis Shelton participated in the session, in person in the office with the therapist, and consented to treatment Danica reviewed the events of the past week. Kalika noted her recent hospitalization due to COVID and noted this being quite distressing. She noted falling, yesterday, but noted not being injured by this fall. She noted her son visiting her in the hospital. 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 She noted her attempts to connect with her son during his visit and noted making a a request to possibly stay with him, should the need arise. She discussed difficulty with predicting his behavior, generally, but noted that he would consider this request. She noted uncertain regarding the prognosis and noted an upcoming appointment with her provider to discuss concern along with generally feeling "off". We worked on processing this and therapist aided in  identifying possible resources for financial aid. She noted the stressors of the  political atmosphere and how this might affect her health care and finances, as a whole. We worked on exploring this during the session. She noted how things are unpredictable. Therapist validated Brentlee's feelings and experience. Therapist encouraged focusing on areas of control. A follow-up was scheduled for continued treatment, which Gail benefits from.    Interventions: CBT and interpersonal   Diagnosis:   Other depression  Psychiatric Treatment: No   Treatment Plan:  Client Abilities/Strengths Teala is verbose and motivated for change.   Support System: Friend: [name]  Client Treatment Preferences Outpatient therapy.  Client Statement of Needs Jessey would like to process relationships with son and sister, managing her overall symptoms, manage overall stressors, including financials, and process past events.  Additionally, processing aging, feeling lonely, and financial stressors.   Treatment Level Weekly  Symptoms  Depression: loss of interest, feeling down, difficulty sleeping (middle insomnia) ,lethargy, fluctuating appetite (poor appetite), , trouble concentration, psychomotor retardation, and denied history of SI   (Status: maintained) Anxiety: Feeling nervous, difficulty managing worry, worrying about different things, trouble relaxing, difficulty sitting still, easily irritable, feeling afraid something awful might happen.    (Status: maintained)  Goals:   Lucrecia experiences symptoms of depression and anxiety.    Target Date: 05/04/24 Frequency: Weekly  Progress: 0 Modality: individual    Therapist will provide referrals for additional resources as appropriate.  Therapist will provide psycho-education regarding Dorismar's diagnosis and corresponding treatment approaches and interventions. Licensed Clinical Social Worker, [place], LCSW will support the patient's  ability to achieve the  goals identified. will employ CBT, BA, Problem-solving, Solution Focused, Mindfulness,  coping skills, & other evidenced-based practices will be used to promote progress towards healthy functioning to help manage decrease symptoms associated with her diagnosis.   Reduce overall level, frequency, and intensity of the feelings of depression and anxiety evidenced by decreased overall symptoms from 6 to 7 days/week to 0 to 1 days/week per client report for at least 3 consecutive months. Verbally express understanding of the relationship between feelings of depression, anxiety and their impact on thinking patterns and behaviors. Verbalize an understanding of the role that distorted thinking plays in creating fears, excessive worry, and ruminations.  Alexis Shelton participated in the creation of the treatment plan)    Belva Boyden, LCSW

## 2024-04-15 ENCOUNTER — Encounter: Payer: Self-pay | Admitting: Internal Medicine

## 2024-04-15 ENCOUNTER — Other Ambulatory Visit: Payer: Self-pay | Admitting: Internal Medicine

## 2024-04-15 ENCOUNTER — Ambulatory Visit (INDEPENDENT_AMBULATORY_CARE_PROVIDER_SITE_OTHER): Admitting: Internal Medicine

## 2024-04-15 VITALS — BP 142/52 | HR 71 | Temp 98.2°F | Ht 62.0 in | Wt 170.6 lb

## 2024-04-15 DIAGNOSIS — I10 Essential (primary) hypertension: Secondary | ICD-10-CM

## 2024-04-15 DIAGNOSIS — N1832 Chronic kidney disease, stage 3b: Secondary | ICD-10-CM

## 2024-04-15 DIAGNOSIS — M51369 Other intervertebral disc degeneration, lumbar region without mention of lumbar back pain or lower extremity pain: Secondary | ICD-10-CM

## 2024-04-15 DIAGNOSIS — M5030 Other cervical disc degeneration, unspecified cervical region: Secondary | ICD-10-CM

## 2024-04-15 LAB — BASIC METABOLIC PANEL WITH GFR
BUN: 26 mg/dL — ABNORMAL HIGH (ref 6–23)
CO2: 29 meq/L (ref 19–32)
Calcium: 9.4 mg/dL (ref 8.4–10.5)
Chloride: 104 meq/L (ref 96–112)
Creatinine, Ser: 1.51 mg/dL — ABNORMAL HIGH (ref 0.40–1.20)
GFR: 31.52 mL/min — ABNORMAL LOW (ref >60.00)
Glucose, Bld: 83 mg/dL (ref 70–99)
Potassium: 4.5 meq/L (ref 3.5–5.1)
Sodium: 139 meq/L (ref 135–145)

## 2024-04-15 MED ORDER — MONTELUKAST SODIUM 10 MG PO TABS: ORAL_TABLET | ORAL | 0 refills | Status: DC

## 2024-04-15 MED ORDER — TIZANIDINE HCL 4 MG PO TABS: ORAL_TABLET | ORAL | 1 refills | Status: DC

## 2024-04-15 NOTE — Progress Notes (Signed)
 Subjective:  Patient ID: Alexis Shelton, female    DOB: 1941-03-07  Age: 83 y.o. MRN: [id number]  CC: Hypertension, Osteoarthritis, and Knee Pain   HPI Alexis Shelton presents for f/up ----  Discussed the use of AI scribe software for clinical note transcription with the patient, who gave verbal consent to proceed.  History of Present Illness   Alexis Shelton is an 83 year old female who presents with post-COVID fatigue and shortness of breath.  She has mostly recovered from a COVID infection but experiences occasional days of reduced energy and enthusiasm, both emotionally and physically. No significant changes in weight or appetite are noted, although she has started a new medication regimen recently.  She experiences occasional shortness of breath in the evenings, which is relieved by using her Symbicort  inhaler. No persistent coughing or wheezing is present.  She feels nauseated at times but denies vomiting or abdominal pain. Occasional constipation is managed by consuming fruits like pineapple and strawberries. Raspberry ice pops are used to help with stomach discomfort.  She had a fall yesterday while trying to move a cord with her foot, resulting in a minor headache last night. She did not hit her head or lose consciousness during the fall.       Outpatient Medications Prior to Visit  Medication Sig Dispense Refill   allopurinol  (ZYLOPRIM ) 100 MG tablet Take 1 tablet (100 mg total) by mouth 2 (two) times daily. 180 tablet 3   ASPIRIN  LOW DOSE 81 MG tablet TAKE 1 TABLET EVERY DAY (SWALLOW WHOLE) 90 tablet 3   eszopiclone  (LUNESTA ) 2 MG TABS tablet Take 1 tablet (2 mg total) by mouth at bedtime as needed for sleep. Take immediately before bedtime 90 tablet 0   Insulin  Pen Needle 32G X 6 MM MISC 1 Act by Does not apply route once a week. 30 each 1   levothyroxine  (SYNTHROID ) 88 MCG tablet Take 1 tablet (88 mcg total) by mouth daily before breakfast. 90 tablet 0    metoprolol  succinate (TOPROL -XL) 25 MG 24 hr tablet TAKE 1 TABLET (25 MG TOTAL) BY MOUTH DAILY. 90 tablet 0   modafinil  (PROVIGIL ) 100 MG tablet Take 1 tablet (100 mg total) by mouth daily. 30 tablet 2   Multiple Vitamin (TAB-A-VITE) TABS TAKE 1 TABLET EVERY DAY 90 tablet 3   ondansetron  (ZOFRAN -ODT) 4 MG disintegrating tablet Take 1 tablet (4 mg total) by mouth every 8 (eight) hours as needed for nausea or vomiting. 12 tablet 0   rosuvastatin  (CRESTOR ) 20 MG tablet Take 1 tablet (20 mg total) by mouth daily. 90 tablet 3   Semaglutide ,0.25 or 0.5MG /DOS, (OZEMPIC , 0.25 OR 0.5 MG/DOSE,) 2 MG/3ML SOPN Inject 0.5 mg into the skin once a week. Take 0.25 mg subcutaneous once weekly 3 mL 0   SYMBICORT  160-4.5 MCG/ACT inhaler INHALE 2 PUFFS INTO THE LUNGS DAILY AS NEEDED (SHORTNESS OF BREATH/WHEEZING). 30.6 each 1   terazosin  (HYTRIN ) 2 MG capsule Take 1 capsule (2 mg total) by mouth at bedtime. 90 capsule 0   traMADol  (ULTRAM ) 50 MG tablet TAKE 1 TABLET EVERY 6 HOURS AS NEEDED 120 tablet 0   montelukast  (SINGULAIR ) 10 MG tablet TAKE 1 TABLET BY MOUTH EVERYDAY AT BEDTIME 90 tablet 0   tiZANidine  (ZANAFLEX ) 4 MG tablet TAKE 1 TABLET BY MOUTH AT BEDTIME AS NEEDED FOR MUSCLE SPASMS. 90 tablet 1   LOKELMA  5 g packet TAKE CONTENTS OF 1 PACKET (5 GRAMS) AS DIRECTED TWICE A DAY (Patient not taking: Reported on 04/15/2024)  60 packet 0   No facility-administered medications prior to visit.    ROS Review of Systems  Constitutional:  Positive for fatigue. Negative for appetite change, chills, diaphoresis, fever and unexpected weight change.  HENT: Negative.  Negative for trouble swallowing.   Respiratory:  Positive for shortness of breath. Negative for cough, chest tightness and wheezing.   Genitourinary: Negative.  Negative for difficulty urinating.  Musculoskeletal:  Positive for arthralgias. Negative for joint swelling and myalgias.  Neurological:  Negative for dizziness, weakness and headaches.   Hematological:  Negative for adenopathy. Does not bruise/bleed easily.  Psychiatric/Behavioral:  Positive for confusion, decreased concentration and dysphoric mood.     Objective:  BP (!) 142/52 (BP Location: Left Arm, Patient Position: Sitting, Cuff Size: Small)   Pulse 71   Temp 98.2 F (36.8 C) (Oral)   Ht 5\' 2"  (1.575 m)   Wt 170 lb 9.6 oz (77.4 kg)   SpO2 94%   BMI 31.20 kg/m   BP Readings from Last 3 Encounters:  04/15/24 (!) 142/52  03/16/24 (!) 158/67  03/03/24 122/78    Wt Readings from Last 3 Encounters:  04/15/24 170 lb 9.6 oz (77.4 kg)  03/14/24 170 lb 10.2 oz (77.4 kg)  02/24/24 170 lb 9.6 oz (77.4 kg)    Physical Exam Vitals reviewed.  Constitutional:      Appearance: Normal appearance.  HENT:     Mouth/Throat:     Mouth: Mucous membranes are moist.  Eyes:     General: No scleral icterus.    Conjunctiva/sclera: Conjunctivae normal.  Cardiovascular:     Rate and Rhythm: Normal rate and regular rhythm.     Heart sounds: No murmur heard.    No friction rub. No gallop.  Pulmonary:     Effort: Pulmonary effort is normal.     Breath sounds: No stridor. No wheezing, rhonchi or rales.  Abdominal:     General: Abdomen is flat.     Palpations: There is no mass.     Tenderness: There is no abdominal tenderness. There is no guarding.     Hernia: No hernia is present.  Musculoskeletal:        General: Normal range of motion.     Cervical back: Neck supple.     Right lower leg: No edema.     Left lower leg: No edema.  Lymphadenopathy:     Cervical: No cervical adenopathy.  Skin:    General: Skin is warm and dry.  Neurological:     General: No focal deficit present.     Mental Status: She is alert. Mental status is at baseline.  Psychiatric:        Mood and Affect: Mood normal.        Behavior: Behavior normal.     Lab Results  Component Value Date   WBC 9.0 03/16/2024   HGB 12.9 03/16/2024   HCT 39.8 03/16/2024   PLT 212 03/16/2024   GLUCOSE  83 04/15/2024   CHOL 194 08/07/2023   TRIG 125.0 08/07/2023   HDL 51.40 08/07/2023   LDLDIRECT 175.0 06/06/2022   LDLCALC 117 (H) 08/07/2023   ALT 40 03/13/2024   AST 42 (H) 03/13/2024   NA 139 04/15/2024   K 4.5 04/15/2024   CL 104 04/15/2024   CREATININE 1.51 (H) 04/15/2024   BUN 26 (H) 04/15/2024   CO2 29 04/15/2024   TSH 0.90 02/24/2024   INR 1.0 03/14/2024   HGBA1C 5.9 02/24/2024   MICROALBUR 44.7 (H)  02/24/2024    DG Chest 2 View Result Date: 03/13/2024 CLINICAL DATA:  Shortness of breath.  Fever.  Altered mental status. EXAM: CHEST - 2 VIEW COMPARISON:  12/19/2022. FINDINGS: Low lung volume. Bilateral lung fields are clear. Bilateral costophrenic angles are clear. Stable cardio-mediastinal silhouette. No acute osseous abnormalities. The soft tissues are within normal limits. IMPRESSION: No active cardiopulmonary disease. Electronically Signed   By: Beula Brunswick M.D.   On: 03/13/2024 16:54    Assessment & Plan:   CKD stage 3b, GFR 30-44 ml/min (HCC)- Will avoid nephrotoxic agents  -     Basic metabolic panel with GFR; Future  Essential hypertension- Her BP is well controlled. -     Basic metabolic panel with GFR; Future     Follow-up: Return in about 4 months (around 08/15/2024).  Sandra Crouch, MD

## 2024-04-15 NOTE — Telephone Encounter (Signed)
 Copied from CRM [id number]. Topic: Clinical - Medication Refill >> Apr 15, 2024  2:10 PM [place] M wrote: Most Recent Primary Care Visit:  Provider: Arcadio Knuckles  Department: [facility]  Visit Type: HOSPITAL FOLLOW UP  Date: 04/15/2024  Medication: montelukast  (SINGULAIR ) 10 MG tablet ; tiZANidine  (ZANAFLEX ) 4 MG tablet  Has the patient contacted their pharmacy? Yes (Agent: If no, request that the patient contact the pharmacy for the refill. If patient does not wish to contact the pharmacy document the reason why and proceed with request.) (Agent: If yes, when and what did the pharmacy advise?)  Is this the correct pharmacy for this prescription? Yes If no, delete pharmacy and type the correct one.  This is the patient's preferred pharmacy:   [facility] - [place], [place] - [address] [place] [address] Phone: [phone] Fax: [phone]   Has the prescription been filled recently? Yes  Is the patient out of the medication? Yes  Has the patient been seen for an appointment in the last year OR does the patient have an upcoming appointment? Yes  Can we respond through MyChart? Yes  Agent: Please be advised that Rx refills may take up to 3 business days. We ask that you follow-up with your pharmacy.

## 2024-04-15 NOTE — Patient Instructions (Signed)
 Chronic Kidney Disease in Adults: What to Know Chronic kidney disease (CKD) is when lasting damage happens to the kidneys slowly over time. The kidneys are two organs that do many important things in the body. These include: Taking waste and extra fluid out of the blood to make pee (urine). Making hormones. Keeping the right amount of fluids and chemicals in the body. A small amount of kidney damage may not cause problems. You must take steps to help keep the kidney damage from getting worse. A lot of damage may cause kidney failure. Kidney failure means the kidneys can no longer work right. What are the causes? Diabetes. High blood pressure. Diseases that affect the heart and blood vessels. Other kidney diseases. Diseases that affect the body's defense system (immune system). A problem with the flow of pee. This may be caused by: Kidney stones. Cancer. An enlarged prostate, in males. A kidney infection or urinary tract infection (UTI) that keeps coming back. What increases the risk? Getting older. The chances of having CKD increase with age. A family history of kidney disease or kidney failure. Having a disease caused by genes. Taking medicines that can harm the kidneys. Being near or having contact with harmful substances. Being very overweight. Using tobacco now or in the past. What are the signs or symptoms? Common symptoms of CKD include: Feeling very tired and having less energy. Swelling of the face, legs, ankles, or feet. Throwing up or feeling like you may throw up. Not wanting to eat as much as normal. Being confused or not able to focus. Twitches and cramps in the leg muscles or other muscles. Dry, itchy skin. Other symptoms may include: Shortness of breath. Trouble sleeping. Making less pee, or making more pee, especially at night. A taste of metal in your mouth. You may also become anemic. Anemia means there's not enough red blood cells in your blood. You may get  symptoms slowly. You may not notice them until the kidney damage gets very bad. How is this diagnosed? CKD may be diagnosed based on: Tests on your blood or pee. Imaging tests, like an ultrasound or a CT scan. A kidney biopsy. For this test, a sample of kidney tissue is removed to be looked at under a microscope. These tests will help to find out how serious the CKD is. How is this treated? Often, there's no cure for CKD. Treatment can help with symptoms and help keep the disease from getting worse. Treatment may include: Treating other problems that are causing your CKD or making it worse. Diet changes. You may need to: Avoid alcohol. Avoid foods that are high in salt, potassium, phosphorous, and protein. Taking medicines for symptoms and to help control other conditions. Dialysis. This treatment gets harmful waste out of your body. It may be needed if you have kidney failure. Follow these instructions at home: Medicines Take your medicines only as told. The amount of some medicines you take may need to be changed. Do not take any new medicines, vitamins, or supplements unless your health care provider says it's okay. These may make kidney damage worse. Lifestyle Do not smoke, vape, or use nicotine or tobacco. If you drink alcohol: Limit how much you have to: 0-1 drink a day if you're female. 0-2 drinks a day if you're female. Know how much alcohol is in your drink. In the [place] one drink is one 12 oz bottle of beer (355 mL), one 5 oz glass of wine (148 mL), or one 1 oz  glass of hard liquor (44 mL). Stay at a healthy weight. If you need help, ask your provider. General instructions  Eat and drink as told. Track your blood pressure at home. Tell your provider about any changes. If you have diabetes, track your blood sugar as told. Exercise at least 30 minutes a day, 5 days a week. Keep your shots (vaccinations) up to date. Keep all follow-up visits. Your provider may need to change  your treatments over time. Where to find support American Kidney Fund: [place].com.au Kidney School: kidneyschool.org American Association of Kidney Patients: https://www.miller-montoya.com/ Where to find more information National Kidney Foundation: kidney.org Centers for Disease Control and Prevention. To learn more: Go to DiningCalendar.de. Click "Search". Type "chronic kidney disease" in the search box. Contact a health care provider if: You have new symptoms. You get symptoms of end-stage kidney disease. These include: Headaches. Numbness in your hands or feet. Leg cramps. Easy bruising. Get help right away if: You have a fever. You make less pee than usual. You have pain or bleeding when you pee or poop. You have chest pain. You have shortness of breath. These symptoms may be an emergency. Call 911 right away. Do not wait to see if the symptoms will go away. Do not drive yourself to the hospital. This information is not intended to replace advice given to you by your health care provider. Make sure you discuss any questions you have with your health care provider. Document Revised: 10/22/2023 Document Reviewed: 06/14/2023 Elsevier Patient Education  2024 [employer].

## 2024-04-16 ENCOUNTER — Encounter: Payer: Self-pay | Admitting: Internal Medicine

## 2024-04-20 DIAGNOSIS — N1832 Chronic kidney disease, stage 3b: Secondary | ICD-10-CM | POA: Diagnosis not present

## 2024-04-21 ENCOUNTER — Ambulatory Visit: Payer: Self-pay

## 2024-04-21 NOTE — Telephone Encounter (Signed)
 This RN made second attempt to triage patient. No answer, unable to LVM. Routing for additional attempts.

## 2024-04-21 NOTE — Telephone Encounter (Signed)
 This RN made first attempt to triage patient. No answer, LVM. Routing for additional attempts.   Copied From CRM [id number]. Reason for Triage: upset stomach,feeling queasy

## 2024-04-21 NOTE — Telephone Encounter (Signed)
 This RN made third and final attempt to triage patient. No answer, unable to LVM. Routing to office for follow-up.

## 2024-04-23 ENCOUNTER — Ambulatory Visit: Payer: Self-pay

## 2024-04-23 NOTE — Telephone Encounter (Signed)
 This RN attempted third attempt to contact patient. No answer. LVM. Will route to clinic.

## 2024-04-23 NOTE — Telephone Encounter (Addendum)
 2nd attempt, no answer, unable to LVM.  1st attempt to reach pt, attempted x2, sounded like someone picked up and disconnected line.  Copied from CRM [id number]. Topic: Clinical - Medication Question >> Apr 23, 2024  1:37 PM Alexis Shelton wrote: Reason for CRM: Patient stated she had some labs done at [facility] and had to stop taking her medication and is requesting a call back regarding if she should continue taking her medication LOKELMA  5 g packet [[id number]]? Also stated she's not feeling well. Feeling extra tired.

## 2024-04-24 ENCOUNTER — Other Ambulatory Visit: Payer: Self-pay | Admitting: Emergency Medicine

## 2024-04-24 ENCOUNTER — Other Ambulatory Visit (HOSPITAL_COMMUNITY): Payer: Self-pay

## 2024-04-24 MED ORDER — ONDANSETRON 4 MG PO TBDP
4.0000 mg | ORAL_TABLET | Freq: Three times a day (TID) | ORAL | 4 refills | Status: DC | PRN
  Filled 2024-04-24: qty 12, 4d supply, fill #0

## 2024-04-27 ENCOUNTER — Encounter: Payer: Self-pay | Admitting: Pharmacist

## 2024-04-27 ENCOUNTER — Other Ambulatory Visit: Payer: Self-pay

## 2024-04-27 NOTE — Telephone Encounter (Signed)
 Unable to reach patient. LMTRC

## 2024-04-29 ENCOUNTER — Telehealth: Payer: Self-pay | Admitting: Internal Medicine

## 2024-04-29 DIAGNOSIS — M5030 Other cervical disc degeneration, unspecified cervical region: Secondary | ICD-10-CM

## 2024-04-29 DIAGNOSIS — M51369 Other intervertebral disc degeneration, lumbar region without mention of lumbar back pain or lower extremity pain: Secondary | ICD-10-CM

## 2024-04-29 NOTE — Telephone Encounter (Signed)
 Copied from CRM [id number]. Topic: Clinical - Medication Refill >> Apr 29, 2024  1:46 PM Adaysia C wrote: Medication: montelukast  (SINGULAIR ) 10 MG tablet tiZANidine  (ZANAFLEX ) 4 MG tablet  Has the patient contacted their pharmacy? Yes, patients [facility] pharmacy called to initiate RX refill request through providers office (Agent: If no, request that the patient contact the pharmacy for the refill. If patient does not wish to contact the pharmacy document the reason why and proceed with request.) (Agent: If yes, when and what did the pharmacy advise?)  This is the patient's preferred pharmacy:   [facility] - [place], [place] - [address] [place] [address] Phone: [phone] Fax: [phone]  Is this the correct pharmacy for this prescription? Yes If no, delete pharmacy and type the correct one.   Has the prescription been filled recently? Yes  Is the patient out of the medication? N/A  Has the patient been seen for an appointment in the last year OR does the patient have an upcoming appointment? Yes  Can we respond through MyChart? Yes  Agent: Please be advised that Rx refills may take up to 3 business days. We ask that you follow-up with your pharmacy.

## 2024-04-29 NOTE — Telephone Encounter (Signed)
 Unable to reach patient. This was my 3rd and final attempt. If she calls back please open a new encounter.

## 2024-04-30 ENCOUNTER — Other Ambulatory Visit: Payer: Self-pay

## 2024-05-11 ENCOUNTER — Other Ambulatory Visit: Payer: Self-pay

## 2024-05-11 ENCOUNTER — Observation Stay (HOSPITAL_BASED_OUTPATIENT_CLINIC_OR_DEPARTMENT_OTHER)
Admission: EM | Admit: 2024-05-11 | Discharge: 2024-05-12 | Disposition: A | Discharge status: Home Health | Attending: Internal Medicine | Admitting: Internal Medicine

## 2024-05-11 ENCOUNTER — Ambulatory Visit: Payer: Self-pay | Admitting: Internal Medicine

## 2024-05-11 ENCOUNTER — Emergency Department (HOSPITAL_BASED_OUTPATIENT_CLINIC_OR_DEPARTMENT_OTHER): Admitting: Radiology

## 2024-05-11 ENCOUNTER — Encounter (HOSPITAL_BASED_OUTPATIENT_CLINIC_OR_DEPARTMENT_OTHER): Payer: Self-pay | Admitting: Emergency Medicine

## 2024-05-11 DIAGNOSIS — R0602 Shortness of breath: Secondary | ICD-10-CM | POA: Diagnosis present

## 2024-05-11 DIAGNOSIS — I517 Cardiomegaly: Secondary | ICD-10-CM | POA: Diagnosis not present

## 2024-05-11 DIAGNOSIS — E1122 Type 2 diabetes mellitus with diabetic chronic kidney disease: Secondary | ICD-10-CM | POA: Insufficient documentation

## 2024-05-11 DIAGNOSIS — M109 Gout, unspecified: Secondary | ICD-10-CM | POA: Diagnosis not present

## 2024-05-11 DIAGNOSIS — Z79899 Other long term (current) drug therapy: Secondary | ICD-10-CM | POA: Insufficient documentation

## 2024-05-11 DIAGNOSIS — Z7982 Long term (current) use of aspirin: Secondary | ICD-10-CM | POA: Diagnosis not present

## 2024-05-11 DIAGNOSIS — Z96653 Presence of artificial knee joint, bilateral: Secondary | ICD-10-CM | POA: Insufficient documentation

## 2024-05-11 DIAGNOSIS — Z794 Long term (current) use of insulin: Secondary | ICD-10-CM | POA: Diagnosis not present

## 2024-05-11 DIAGNOSIS — I129 Hypertensive chronic kidney disease with stage 1 through stage 4 chronic kidney disease, or unspecified chronic kidney disease: Secondary | ICD-10-CM | POA: Diagnosis not present

## 2024-05-11 DIAGNOSIS — S2242XA Multiple fractures of ribs, left side, initial encounter for closed fracture: Principal | ICD-10-CM | POA: Insufficient documentation

## 2024-05-11 DIAGNOSIS — J45909 Unspecified asthma, uncomplicated: Secondary | ICD-10-CM | POA: Diagnosis not present

## 2024-05-11 DIAGNOSIS — N1832 Chronic kidney disease, stage 3b: Secondary | ICD-10-CM | POA: Diagnosis present

## 2024-05-11 DIAGNOSIS — Z87891 Personal history of nicotine dependence: Secondary | ICD-10-CM | POA: Insufficient documentation

## 2024-05-11 DIAGNOSIS — M19012 Primary osteoarthritis, left shoulder: Secondary | ICD-10-CM | POA: Diagnosis not present

## 2024-05-11 DIAGNOSIS — G4700 Insomnia, unspecified: Secondary | ICD-10-CM | POA: Insufficient documentation

## 2024-05-11 DIAGNOSIS — E669 Obesity, unspecified: Secondary | ICD-10-CM | POA: Diagnosis not present

## 2024-05-11 DIAGNOSIS — J189 Pneumonia, unspecified organism: Secondary | ICD-10-CM | POA: Diagnosis present

## 2024-05-11 DIAGNOSIS — Z6827 Body mass index (BMI) 27.0-27.9, adult: Secondary | ICD-10-CM | POA: Insufficient documentation

## 2024-05-11 DIAGNOSIS — Z7985 Long-term (current) use of injectable non-insulin antidiabetic drugs: Secondary | ICD-10-CM | POA: Diagnosis not present

## 2024-05-11 DIAGNOSIS — M85812 Other specified disorders of bone density and structure, left shoulder: Secondary | ICD-10-CM | POA: Diagnosis not present

## 2024-05-11 DIAGNOSIS — R001 Bradycardia, unspecified: Secondary | ICD-10-CM | POA: Insufficient documentation

## 2024-05-11 DIAGNOSIS — S2249XA Multiple fractures of ribs, unspecified side, initial encounter for closed fracture: Secondary | ICD-10-CM | POA: Insufficient documentation

## 2024-05-11 DIAGNOSIS — R918 Other nonspecific abnormal finding of lung field: Secondary | ICD-10-CM | POA: Diagnosis not present

## 2024-05-11 DIAGNOSIS — J90 Pleural effusion, not elsewhere classified: Secondary | ICD-10-CM | POA: Diagnosis not present

## 2024-05-11 DIAGNOSIS — Z8673 Personal history of transient ischemic attack (TIA), and cerebral infarction without residual deficits: Secondary | ICD-10-CM

## 2024-05-11 DIAGNOSIS — E039 Hypothyroidism, unspecified: Secondary | ICD-10-CM | POA: Diagnosis not present

## 2024-05-11 DIAGNOSIS — M778 Other enthesopathies, not elsewhere classified: Secondary | ICD-10-CM | POA: Diagnosis not present

## 2024-05-11 DIAGNOSIS — W19XXXA Unspecified fall, initial encounter: Secondary | ICD-10-CM | POA: Insufficient documentation

## 2024-05-11 LAB — CBC
HCT: 39.3 % (ref 36.0–46.0)
HCT: 43.2 % (ref 36.0–46.0)
Hemoglobin: 12.7 g/dL (ref 12.0–15.0)
Hemoglobin: 13.5 g/dL (ref 12.0–15.0)
MCH: 31.6 pg (ref 26.0–34.0)
MCH: 31.7 pg (ref 26.0–34.0)
MCHC: 32.3 g/dL (ref 30.0–36.0)
MCHC: 31.3 g/dL (ref 30.0–36.0)
MCV: 97.8 fL (ref 80.0–100.0)
MCV: 101.4 fL — ABNORMAL HIGH (ref 80.0–100.0)
Platelets: 227 10*3/uL (ref 150–400)
Platelets: 218 10*3/uL (ref 150–400)
RBC: 4.02 MIL/uL (ref 3.87–5.11)
RBC: 4.26 MIL/uL (ref 3.87–5.11)
RDW: 15.4 % (ref 11.5–15.5)
RDW: 15.4 % (ref 11.5–15.5)
WBC: 6.7 10*3/uL (ref 4.0–10.5)
WBC: 5.9 10*3/uL (ref 4.0–10.5)
nRBC: 0 % (ref 0.0–0.2)
nRBC: 0 % (ref 0.0–0.2)

## 2024-05-11 LAB — BASIC METABOLIC PANEL WITH GFR
Anion gap: 12 (ref 5–15)
BUN: 19 mg/dL (ref 8–23)
CO2: 23 mmol/L (ref 22–32)
Calcium: 9.7 mg/dL (ref 8.9–10.3)
Chloride: 105 mmol/L (ref 98–111)
Creatinine, Ser: 1.36 mg/dL — ABNORMAL HIGH (ref 0.44–1.00)
GFR, Estimated: 38 mL/min — ABNORMAL LOW (ref >60)
Glucose, Bld: 88 mg/dL (ref 70–99)
Potassium: 4.3 mmol/L (ref 3.5–5.1)
Sodium: 140 mmol/L (ref 135–145)

## 2024-05-11 LAB — CREATININE, SERUM
Creatinine, Ser: 1.15 mg/dL — ABNORMAL HIGH (ref 0.44–1.00)
GFR, Estimated: 47 mL/min — ABNORMAL LOW (ref >60)

## 2024-05-11 MED ORDER — ACETAMINOPHEN 325 MG PO TABS: 650.0000 mg | ORAL_TABLET | Freq: Four times a day (QID) | ORAL | Status: DC | PRN

## 2024-05-11 MED ORDER — HYDROCODONE-ACETAMINOPHEN 5-325 MG PO TABS
1.0000 | ORAL_TABLET | ORAL | Status: DC | PRN
  Administered 2024-05-11 – 2024-05-12 (×2): 1 via ORAL
  Filled 2024-05-11: qty 1

## 2024-05-11 MED ORDER — MONTELUKAST SODIUM 10 MG PO TABS
10.0000 mg | ORAL_TABLET | Freq: Every day | ORAL | Status: DC
  Administered 2024-05-11: 10 mg via ORAL
  Filled 2024-05-11: qty 1

## 2024-05-11 MED ORDER — HYDROCODONE-ACETAMINOPHEN 5-325 MG PO TABS: 1.0000 | ORAL_TABLET | ORAL | Status: DC | PRN

## 2024-05-11 MED ORDER — MOMETASONE FURO-FORMOTEROL FUM 200-5 MCG/ACT IN AERO
2.0000 | INHALATION_SPRAY | Freq: Two times a day (BID) | RESPIRATORY_TRACT | Status: DC
  Administered 2024-05-12: 2 via RESPIRATORY_TRACT
  Filled 2024-05-11: qty 8.8

## 2024-05-11 MED ORDER — IPRATROPIUM-ALBUTEROL 0.5-2.5 (3) MG/3ML IN SOLN
3.0000 mL | Freq: Once | RESPIRATORY_TRACT | Status: AC
  Administered 2024-05-11: 3 mL via RESPIRATORY_TRACT
  Filled 2024-05-11: qty 3

## 2024-05-11 MED ORDER — SODIUM CHLORIDE 0.9 % IV SOLN
500.0000 mg | Freq: Once | INTRAVENOUS | Status: AC
  Administered 2024-05-11: 500 mg via INTRAVENOUS
  Filled 2024-05-11: qty 5

## 2024-05-11 MED ORDER — METOPROLOL SUCCINATE ER 25 MG PO TB24
25.0000 mg | ORAL_TABLET | Freq: Every day | ORAL | Status: DC
  Administered 2024-05-11 – 2024-05-12 (×2): 25 mg via ORAL
  Filled 2024-05-11 (×2): qty 1

## 2024-05-11 MED ORDER — ACETAMINOPHEN 650 MG RE SUPP: 650.0000 mg | Freq: Four times a day (QID) | RECTAL | Status: DC | PRN

## 2024-05-11 MED ORDER — HYDROCODONE-ACETAMINOPHEN 5-325 MG PO TABS
1.0000 | ORAL_TABLET | Freq: Once | ORAL | Status: AC
  Administered 2024-05-11: 1 via ORAL
  Filled 2024-05-11: qty 1

## 2024-05-11 MED ORDER — ADULT MULTIVITAMIN W/MINERALS CH
1.0000 | ORAL_TABLET | Freq: Every day | ORAL | Status: DC
  Administered 2024-05-11 – 2024-05-12 (×2): 1 via ORAL
  Filled 2024-05-11 (×2): qty 1

## 2024-05-11 MED ORDER — MORPHINE SULFATE (PF) 2 MG/ML IV SOLN: 2.0000 mg | INTRAVENOUS | Status: DC | PRN

## 2024-05-11 MED ORDER — ASPIRIN 81 MG PO TBEC: 81.0000 mg | DELAYED_RELEASE_TABLET | Freq: Every day | ORAL | Status: DC

## 2024-05-11 MED ORDER — SODIUM CHLORIDE 0.9 % IV SOLN: INTRAVENOUS | Status: DC | PRN

## 2024-05-11 MED ORDER — LEVOTHYROXINE SODIUM 88 MCG PO TABS
88.0000 ug | ORAL_TABLET | Freq: Every day | ORAL | Status: DC
  Administered 2024-05-12: 88 ug via ORAL
  Filled 2024-05-11: qty 1

## 2024-05-11 MED ORDER — HYDROCODONE-ACETAMINOPHEN 5-325 MG PO TABS
2.0000 | ORAL_TABLET | ORAL | Status: DC | PRN
  Administered 2024-05-12: 2 via ORAL
  Filled 2024-05-11: qty 2

## 2024-05-11 MED ORDER — ENOXAPARIN SODIUM 40 MG/0.4ML IJ SOSY
40.0000 mg | PREFILLED_SYRINGE | INTRAMUSCULAR | Status: DC
  Administered 2024-05-11: 40 mg via SUBCUTANEOUS
  Filled 2024-05-11: qty 0.4

## 2024-05-11 MED ORDER — ALLOPURINOL 100 MG PO TABS
100.0000 mg | ORAL_TABLET | Freq: Two times a day (BID) | ORAL | Status: DC
  Administered 2024-05-11: 100 mg via ORAL
  Filled 2024-05-11: qty 1

## 2024-05-11 MED ORDER — HYDROCODONE-ACETAMINOPHEN 5-325 MG PO TABS
2.0000 | ORAL_TABLET | ORAL | Status: DC | PRN
  Filled 2024-05-11: qty 2

## 2024-05-11 MED ORDER — SODIUM CHLORIDE 0.9 % IV SOLN
1.0000 g | Freq: Once | INTRAVENOUS | Status: AC
  Administered 2024-05-11: 1 g via INTRAVENOUS
  Filled 2024-05-11: qty 10

## 2024-05-11 MED ORDER — TERAZOSIN HCL 2 MG PO CAPS
2.0000 mg | ORAL_CAPSULE | Freq: Every day | ORAL | Status: DC
  Administered 2024-05-11: 2 mg via ORAL
  Filled 2024-05-11: qty 1

## 2024-05-11 MED ORDER — POLYETHYLENE GLYCOL 3350 17 G PO PACK
17.0000 g | PACK | Freq: Every day | ORAL | Status: DC
  Administered 2024-05-11 – 2024-05-12 (×2): 17 g via ORAL
  Filled 2024-05-11 (×2): qty 1

## 2024-05-11 NOTE — ED Triage Notes (Signed)
 Mechanical fall 10 days ago. C/o left sided rib pain and back pain. Trouble taking deep breath.

## 2024-05-11 NOTE — ED Provider Notes (Signed)
 [facility] AT [facility] Provider Note   CSN: [id number] Arrival date & time: 05/11/24  1233     History  Chief Complaint  Patient presents with   Sherline Distel Keylie Beavers is a 83 y.o. female.  83 year old female with past medical history of diabetes, hypothyroidism, and hypertension presenting to the emergency department today with dyspnea on exertion as well as some left-sided chest wall pain.  This has been going on since patient fell around 10 days ago.  She fell on her left side.  States she is having pain under her left scapula as well as on the left lateral chest wall since then.  This is been getting gradually worse.  Came to the ER today for further evaluation.  She reports minimal cough with this.  Denies any fevers.  She is normally not wearing oxygen.   Fall Associated symptoms include shortness of breath.       Home Medications Prior to Admission medications   Medication Sig Start Date End Date Taking? Authorizing Provider  allopurinol  (ZYLOPRIM ) 100 MG tablet Take 1 tablet (100 mg total) by mouth 2 (two) times daily. 04/10/23   Arcadio Knuckles, MD  ASPIRIN  LOW DOSE 81 MG tablet TAKE 1 TABLET EVERY DAY (SWALLOW WHOLE) 04/07/24   Arcadio Knuckles, MD  eszopiclone  (LUNESTA ) 2 MG TABS tablet Take 1 tablet (2 mg total) by mouth at bedtime as needed for sleep. Take immediately before bedtime 02/24/24   Arcadio Knuckles, MD  Insulin  Pen Needle 32G X 6 MM MISC 1 Act by Does not apply route once a week. 12/16/23   Arcadio Knuckles, MD  levothyroxine  (SYNTHROID ) 88 MCG tablet Take 1 tablet (88 mcg total) by mouth daily before breakfast. 02/13/24   Arcadio Knuckles, MD  LOKELMA  5 g packet TAKE CONTENTS OF 1 PACKET (5 GRAMS) AS DIRECTED TWICE A DAY Patient not taking: Reported on 04/15/2024 04/07/24   Arcadio Knuckles, MD  metoprolol  succinate (TOPROL -XL) 25 MG 24 hr tablet TAKE 1 TABLET (25 MG TOTAL) BY MOUTH DAILY. 02/10/24   Arcadio Knuckles, MD   modafinil  (PROVIGIL ) 100 MG tablet Take 1 tablet (100 mg total) by mouth daily. 12/16/23   Arcadio Knuckles, MD  montelukast  (SINGULAIR ) 10 MG tablet TAKE 1 TABLET BY MOUTH EVERYDAY AT BEDTIME 04/15/24   Arcadio Knuckles, MD  Multiple Vitamin (TAB-A-VITE) TABS TAKE 1 TABLET EVERY DAY 03/30/24   Arcadio Knuckles, MD  ondansetron  (ZOFRAN -ODT) 4 MG disintegrating tablet Dissolve 1 tablet under the tongue every 8 (eight) hours as needed for nausea or vomiting. 04/24/24   Arcadio Knuckles, MD  rosuvastatin  (CRESTOR ) 20 MG tablet Take 1 tablet (20 mg total) by mouth daily. 03/03/24   Roslyn Coombe, MD  Semaglutide ,0.25 or 0.5MG /DOS, (OZEMPIC , 0.25 OR 0.5 MG/DOSE,) 2 MG/3ML SOPN Inject 0.5 mg into the skin once a week. Take 0.25 mg subcutaneous once weekly 04/07/24   Arcadio Knuckles, MD  SYMBICORT  160-4.5 MCG/ACT inhaler INHALE 2 PUFFS INTO THE LUNGS DAILY AS NEEDED (SHORTNESS OF BREATH/WHEEZING). 11/06/23   Arcadio Knuckles, MD  terazosin  (HYTRIN ) 2 MG capsule Take 1 capsule (2 mg total) by mouth at bedtime. 02/26/24   Arcadio Knuckles, MD  tiZANidine  (ZANAFLEX ) 4 MG tablet TAKE 1 TABLET BY MOUTH AT BEDTIME AS NEEDED FOR MUSCLE SPASMS. 04/15/24   Arcadio Knuckles, MD  traMADol  (ULTRAM ) 50 MG tablet TAKE 1 TABLET EVERY 6 HOURS AS NEEDED 04/06/24  Arcadio Knuckles, MD      Allergies    Patient has no known allergies.    Review of Systems   Review of Systems  Respiratory:  Positive for shortness of breath.   All other systems reviewed and are negative.   Physical Exam Updated Vital Signs BP (!) 178/53   Pulse 77   Temp 98 F (36.7 C)   Resp 18   SpO2 (!) 89%  Physical Exam Vitals and nursing note reviewed.   Gen: NAD Eyes: PERRL, EOMI HEENT: no oropharyngeal swelling Neck: trachea midline Resp: clear to auscultation bilaterally, tender over the left lateral chest wall, no crepitus Card: RRR, no murmurs, rubs, or gallops Abd: nontender, nondistended Extremities: no calf tenderness, no edema Vascular:  2+ radial pulses bilaterally, 2+ DP pulses bilaterally Skin: no rashes Psyc: acting appropriately   ED Results / Procedures / Treatments   Labs (all labs ordered are listed, but only abnormal results are displayed) Labs Reviewed  BASIC METABOLIC PANEL WITH GFR - Abnormal; Notable for the following components:      Result Value   Creatinine, Ser 1.36 (*)    GFR, Estimated 38 (*)    All other components within normal limits  CBC    EKG None  Radiology DG Chest 2 View Result Date: 05/11/2024 CLINICAL DATA:  Fall, left rib and back pain, shoulder pain EXAM: CHEST - 2 VIEW COMPARISON:  03/13/2024 FINDINGS: There are acute anterolateral left third through sixth rib fractures, better demonstrated on the comparison shoulder x-ray from today. Left basilar atelectasis versus mild airspace disease noted with blunting of left costophrenic angle compatible with a small left effusion. No pneumothorax appreciated. No chest wall subcutaneous emphysema. Right lung remains clear. Mild cardiomegaly without CHF. Aorta is sclerotic. Trachea midline. Degenerative changes of the spine and shoulders. IMPRESSION: 1. Acute left third through sixth rib fractures. 2. Small left pleural effusion with left basilar atelectasis versus airspace disease. 3. No pneumothorax. Electronically Signed   By: Melven Stable.  Shick M.D.   On: 05/11/2024 15:09   DG Shoulder Left Result Date: 05/11/2024 CLINICAL DATA:  Recent fall, left shoulder pain EXAM: LEFT SHOULDER - 2+ VIEW COMPARISON:  03/13/2024 FINDINGS: The bones are osteopenic. Similar degenerative arthropathy of the left AC joint and glenohumeral joint with joint space loss, sclerosis and bony spurring. No acute osseous finding, fracture, or joint malalignment. No subluxation or dislocation. AC joint also non without separation. Included left chest demonstrates no acute displaced rib fractures of the left lateral third through sixth ribs. IMPRESSION: 1. Osteopenia and degenerative  changes as above. 2. No acute finding involving the left shoulder by plain radiography. 3. Acute displaced left lateral third through sixth rib fractures. Electronically Signed   By: Melven Stable.  Shick M.D.   On: 05/11/2024 15:07    Procedures Procedures    Medications Ordered in ED Medications  ipratropium-albuterol  (DUONEB) 0.5-2.5 (3) MG/3ML nebulizer solution 3 mL (has no administration in time range)  cefTRIAXone  (ROCEPHIN ) 1 g in sodium chloride  0.9 % 100 mL IVPB (1 g Intravenous New Bag/Given 05/11/24 1549)  azithromycin  (ZITHROMAX ) 500 mg in sodium chloride  0.9 % 250 mL IVPB (has no administration in time range)  0.9 %  sodium chloride  infusion ( Intravenous New Bag/Given 05/11/24 1548)  HYDROcodone -acetaminophen  (NORCO/VICODIN) 5-325 MG per tablet 1 tablet (1 tablet Oral Given 05/11/24 1539)    ED Course/ Medical Decision Making/ A&P  Medical Decision Making 83 year old female with past medical history of hypertension hypothyroidism presenting to the emergency department today with left-sided chest wall pain after a mechanical fall last week.  I will further evaluate the patient here with x-rays here to evaluate for pneumothorax or rib fractures or infiltrate.  Will give her a DuoNeb if this is unremarkable.  Will also obtain basic labs in the event that she does require admission.  The patient was found to have multiple rib fractures.  She does have a pleural effusion and atelectasis versus infiltrate.  She is still requiring some nasal cannula oxygen.  Calls placed to hospitalist service for admission.  She is given Rocephin  and azithromycin .  Amount and/or Complexity of Data Reviewed Labs: ordered. Radiology: ordered.  Risk Prescription drug management. Decision regarding hospitalization.           Final Clinical Impression(s) / ED Diagnoses Final diagnoses:  Closed fracture of multiple ribs of left side, initial encounter    Rx / DC  Orders ED Discharge Orders     None         Carin Charleston, MD 05/11/24 1601

## 2024-05-11 NOTE — ED Notes (Addendum)
 Patient's O2 sats dropping to 87%.  Having difficulty taking deep breaths due to left rib side pain.  Placed on O2 at 1 L

## 2024-05-11 NOTE — H&P (Signed)
 History and Physical    Alexis Shelton  ZOX:[id number]  DOB: May 20, 1941  DOA: 05/11/2024 PCP: Arcadio Knuckles, MD   Patient coming from: home alone- [place]  Chief Complaint: Left-sided rib and back pain with trouble taking a deep breath  HPI: Alexis Shelton is a 83 y.o. female with medical history of diabetes mellitus, hypertension, hypothyroidism, chronic kidney disease presents with left-sided chest, scapula pain and trouble taking a deep breath.  She is coming from [facility] emergency room. She fell after tripping on a twig about 10 days ago onto her left side and symptoms have been gradually getting worse.  Her husband is currently living [place] and has a recent diagnosis of cancer.  She has been avoiding going to the hospital until she hears how he is doing.   She had 1 bout of cough  last night for about an hour but no cough since.  No fevers or chills.  When she initially presented pain was about 8 out of 10.  On my eval, her pain is 4 out of 10.  ED Course:  Pulse ox dropped to 87%.  Placed on 1 L of oxygen and then pulse ox dropped to 89%.  Oxygen increased to 2 L at which pulse ox was 92%  Creatinine 1.36 with GFR of 38 (chronic)  2 view chest x-ray and left shoulder x-ray:  Acute displaced left 3rd through 6th rib fractures, small left pleural effusion with left basilar atelectasis versus airspace disease  Given ceftriaxone , azithromycin , DuoNeb, 5 mg of Norco  Review of Systems:  All other systems reviewed and apart from HPI, are negative.  Past Medical History:  Diagnosis Date   Arthritis    DDD (degenerative disc disease), lumbar 11/24/2018   Dizziness    Dyspnea    Elevated liver function tests    Gallstones    Hearing loss    History of stroke involving cerebellum 10/13/2018   Hypercholesteremia    Hypertension    Hypothyroidism    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   Migraines    Mild intermittent  asthma without complication 02/11/2019   Primary osteoarthritis of both feet 11/24/2018   Proteinuria    Rheumatoid arthritis (HCC) 07/04/2018   Stage 3 chronic kidney disease (HCC) 02/11/2019   Vitamin D  deficiency 02/11/2019    Past Surgical History:  Procedure Laterality Date   ABDOMINAL HYSTERECTOMY     BREAST REDUCTION SURGERY Bilateral    CATARACT EXTRACTION, BILATERAL     CHOLECYSTECTOMY N/A 12/11/2021   Procedure: LAPAROSCOPIC CHOLECYSTECTOMY;  Surgeon: Lockie Rima, MD;  Location: [facility];  Service: General;  Laterality: N/A;   ENDOSCOPIC RETROGRADE CHOLANGIOPANCREATOGRAPHY (ERCP) WITH PROPOFOL  N/A 12/09/2021   Procedure: ENDOSCOPIC RETROGRADE CHOLANGIOPANCREATOGRAPHY (ERCP) WITH PROPOFOL ;  Surgeon: Janel Medford, MD;  Location: [facility];  Service: Endoscopy;  Laterality: N/A;   INCISION AND DRAINAGE BREAST ABSCESS Left    REDUCTION MAMMAPLASTY     REMOVAL OF STONES  12/09/2021   Procedure: REMOVAL OF STONES;  Surgeon: Janel Medford, MD;  Location: [facility];  Service: Endoscopy;;   REPLACEMENT TOTAL KNEE Bilateral    SPHINCTEROTOMY  12/09/2021   Procedure: SPHINCTEROTOMY;  Surgeon: Janel Medford, MD;  Location: [facility];  Service: Endoscopy;;   TONSILLECTOMY AND ADENOIDECTOMY     TOOTH EXTRACTION  2023   x2    Social History:   reports that she quit smoking about 55 years ago. Her smoking use included cigarettes.  She started smoking about 58 years ago. She has a 0.3 pack-year smoking history. She has never been exposed to tobacco smoke. She has never used smokeless tobacco. She reports that she does not currently use alcohol. She reports that she does not use drugs.  No Known Allergies  Family History  Problem Relation Age of Onset   Stroke Mother    Hyperlipidemia Mother    Arthritis Mother    Heart disease Father    Arthritis Sister    Heart disease Sister    Gallbladder disease Maternal Grandmother        radiation   GER disease Maternal  Grandmother    Bipolar disorder Son    Healthy Son    Heart disease Paternal Uncle        x 7   Colon cancer Neg Hx    Esophageal cancer Neg Hx    Rectal cancer Neg Hx    Stomach cancer Neg Hx    Cancer Neg Hx      Prior to Admission medications   Medication Sig Start Date End Date Taking? Authorizing Provider  allopurinol  (ZYLOPRIM ) 100 MG tablet Take 1 tablet (100 mg total) by mouth 2 (two) times daily. 04/10/23   Arcadio Knuckles, MD  ASPIRIN  LOW DOSE 81 MG tablet TAKE 1 TABLET EVERY DAY (SWALLOW WHOLE) 04/07/24   Arcadio Knuckles, MD  eszopiclone  (LUNESTA ) 2 MG TABS tablet Take 1 tablet (2 mg total) by mouth at bedtime as needed for sleep. Take immediately before bedtime 02/24/24   Arcadio Knuckles, MD  Insulin  Pen Needle 32G X 6 MM MISC 1 Act by Does not apply route once a week. 12/16/23   Arcadio Knuckles, MD  levothyroxine  (SYNTHROID ) 88 MCG tablet Take 1 tablet (88 mcg total) by mouth daily before breakfast. 02/13/24   Arcadio Knuckles, MD  LOKELMA  5 g packet TAKE CONTENTS OF 1 PACKET (5 GRAMS) AS DIRECTED TWICE A DAY Patient not taking: Reported on 04/15/2024 04/07/24   Arcadio Knuckles, MD  metoprolol  succinate (TOPROL -XL) 25 MG 24 hr tablet TAKE 1 TABLET (25 MG TOTAL) BY MOUTH DAILY. 02/10/24   Arcadio Knuckles, MD  modafinil  (PROVIGIL ) 100 MG tablet Take 1 tablet (100 mg total) by mouth daily. 12/16/23   Arcadio Knuckles, MD  montelukast  (SINGULAIR ) 10 MG tablet TAKE 1 TABLET BY MOUTH EVERYDAY AT BEDTIME 04/15/24   Arcadio Knuckles, MD  Multiple Vitamin (TAB-A-VITE) TABS TAKE 1 TABLET EVERY DAY 03/30/24   Arcadio Knuckles, MD  ondansetron  (ZOFRAN -ODT) 4 MG disintegrating tablet Dissolve 1 tablet under the tongue every 8 (eight) hours as needed for nausea or vomiting. 04/24/24   Arcadio Knuckles, MD  rosuvastatin  (CRESTOR ) 20 MG tablet Take 1 tablet (20 mg total) by mouth daily. 03/03/24   Roslyn Coombe, MD  Semaglutide ,0.25 or 0.5MG /DOS, (OZEMPIC , 0.25 OR 0.5 MG/DOSE,) 2 MG/3ML SOPN Inject 0.5 mg into  the skin once a week. Take 0.25 mg subcutaneous once weekly 04/07/24   Arcadio Knuckles, MD  SYMBICORT  160-4.5 MCG/ACT inhaler INHALE 2 PUFFS INTO THE LUNGS DAILY AS NEEDED (SHORTNESS OF BREATH/WHEEZING). 11/06/23   Arcadio Knuckles, MD  terazosin  (HYTRIN ) 2 MG capsule Take 1 capsule (2 mg total) by mouth at bedtime. 02/26/24   Arcadio Knuckles, MD  tiZANidine  (ZANAFLEX ) 4 MG tablet TAKE 1 TABLET BY MOUTH AT BEDTIME AS NEEDED FOR MUSCLE SPASMS. 04/15/24   Arcadio Knuckles, MD  traMADol  (ULTRAM ) 50 MG tablet TAKE  1 TABLET EVERY 6 HOURS AS NEEDED 04/06/24   Arcadio Knuckles, MD    Physical Exam: Wt Readings from Last 3 Encounters:  05/11/24 75.9 kg  04/15/24 77.4 kg  03/14/24 77.4 kg   Vitals:   05/11/24 1613 05/11/24 1627 05/11/24 1700 05/11/24 1737  BP:    (!) 154/69  Pulse:    67  Resp:    18  Temp:  97.8 F (36.6 C)  97.7 F (36.5 C)  TempSrc:  Oral  Oral  SpO2: 95%   95%  Weight:   75.9 kg   Height:   5\' 6"  (1.676 m)       Constitutional:  Calm & comfortable Eyes: PERRLA, lids and conjunctivae normal ENT:  Mucous membranes are moist.  Pharynx clear of exudate   Normal dentition.  Respiratory:  Clear to auscultation bilaterally  Normal respiratory effort.  Cardiovascular:  S1 & S2 heard, regular rate and rhythm No Murmurs Abdomen:  Non distended No tenderness, No masses Bowel sounds normal Extremities:  No clubbing / cyanosis No pedal edema  Skin:  No rashes, lesions or ulcers Neurologic:  AAO x 3 CN 2-12 grossly intact Sensation intact Strength 5/5 in all 4 extremities Psychiatric:  Normal Mood and affect    Labs on Admission: I have personally reviewed labs and imaging studies   EKG: Independently reviewed.  Sinus rhythm at 54 bpm  Assessment/Plan Principal Problem:   Multiple rib fractures secondary to fall Acute hypoxia (without tachypnea) - Pain improving with hydrocodone  - Based on clinical findings, lack of fever, lack of leukocytosis, I do not  feel that she currently has a pneumonia and therefore I have discontinued antibiotics - She does appear to have a small left-sided pleural effusion which could possibly be a hemothorax therefore will hold aspirin  - Incentive spirometry ordered - Currently requiring 2 L of oxygen for pulse ox of 87% on room air-there is no respiratory distress-wean as able  Active Problems:     Hypothyroidism - Continue Synthroid     History of CVA (cerebrovascular accident) - Holding aspirin  in case she has a hemothorax - Can hold statin while she is in the hospital    CKD stage 3b, GFR 30-44 ml/min (HCC) - Creatinine at baseline  Sinus bradycardia - Asymptomatic-telemetry not ordered    Gouty arthropathy - Continue allopurinol     Obesity - She has semaglutide  listed on her med rec Body mass index is 27.02 kg/m.  Insomnia - Uses Lunesta  as needed     DVT prophylaxis: enoxaparin  (LOVENOX ) injection 40 mg Start: 05/11/24 2000   Code Status: full code- discussed with patient  Consults called: none  Admission status: Inpatient order was placed prior to seeing this patient.  After further investigation it appears that we can downgrade her to observation as she will likely leave tomorrow. Level of care: Med-Surg  Sedalia Dacosta MD [facility]    05/11/2024, 7:13 PM

## 2024-05-11 NOTE — ED Notes (Signed)
 Clydie Braun with [facility] called for transport

## 2024-05-11 NOTE — Telephone Encounter (Signed)
 Chief Complaint: Fall on staircase x1 week ago  Symptoms: Pain under left arm 7-8/10 pain level, nausea, weakness, lack of appetite Frequency: Constant Pertinent Negatives: Patient denies fever, hitting head, losing consciousness  Disposition: [x] ED  Additional Notes: Patient states she was just told that her husband has pancreatic cancer. Pt did not want to go due to her husband's recent diagnosis. Pt states the ambulance came for her but she did not go. This RN advised pt to go to ED and have another adult drive her there. Pt states understanding and is agreeable.   Copied from CRM [id number]. Topic: Clinical - Red Word Triage >> May 11, 2024  9:24 AM Ivette P wrote: Red Word that prompted transfer to Nurse Triage: fell on staircase. received notice cancer of the pancreas. sent a person to see and refused to go to hospital. been a week and having pain and getting worse. A week ago friday. Reason for Disposition  Injury (or injuries) that need emergency care  Answer Assessment - Initial Assessment Questions MECHANISM: "How did the fall happen?"     Fell forward on staircase ONSET: "When did the fall happen?" (e.g., minutes, hours, or days ago)     One week ago from Friday LOCATION: "What part of the body hit the ground?" (e.g., back, buttocks, head, hips, knees, hands, head, stomach)     Left side INJURY: "Did you hurt (injure) yourself when you fell?" If Yes, ask: "What did you injure? Tell me more about this?" (e.g., body area; type of injury; pain severity)"     Scrapes on left side PAIN: "Is there any pain?" If Yes, ask: "How bad is the pain?" (e.g., Scale 1-10; or mild,  moderate, severe)   - NONE (0): No pain   - MILD (1-3): Doesn't interfere with normal activities    - MODERATE (4-7): Interferes with normal activities or awakens from sleep    - SEVERE (8-10): Excruciating pain, unable to do any normal activities      7-8/10 SIZE: For cuts, bruises, or swelling, ask: "How large  is it?" (e.g., inches or centimeters)      Can't tell if there is swelling  Protocols used: [facility]

## 2024-05-11 NOTE — ED Notes (Signed)
 Pt assessed per RN request.  PT was on 1L [place] with sats of 89%. Pt's breath sounds are clear and diminished. Pt instructed with pursed lip breathing to help with oxygenation. O2 was increased to 2L [place]. Sats increased to 92%.  MD just assessed pt.

## 2024-05-12 ENCOUNTER — Other Ambulatory Visit: Payer: Self-pay | Admitting: Internal Medicine

## 2024-05-12 ENCOUNTER — Ambulatory Visit: Admitting: Psychology

## 2024-05-12 DIAGNOSIS — M1A09X0 Idiopathic chronic gout, multiple sites, without tophus (tophi): Secondary | ICD-10-CM

## 2024-05-12 DIAGNOSIS — E039 Hypothyroidism, unspecified: Secondary | ICD-10-CM

## 2024-05-12 DIAGNOSIS — S2242XA Multiple fractures of ribs, left side, initial encounter for closed fracture: Secondary | ICD-10-CM | POA: Diagnosis not present

## 2024-05-12 DIAGNOSIS — I10 Essential (primary) hypertension: Secondary | ICD-10-CM

## 2024-05-12 MED ORDER — OXYCODONE HCL 5 MG PO TABS: 5.0000 mg | ORAL_TABLET | ORAL | 0 refills | Status: DC | PRN

## 2024-05-12 MED ORDER — LIDOCAINE 5 % EX PTCH
1.0000 | MEDICATED_PATCH | CUTANEOUS | Status: DC
  Administered 2024-05-12: 1 via TRANSDERMAL
  Filled 2024-05-12: qty 1

## 2024-05-12 MED ORDER — ACETAMINOPHEN 500 MG PO TABS
1000.0000 mg | ORAL_TABLET | Freq: Three times a day (TID) | ORAL | Status: DC
  Administered 2024-05-12: 1000 mg via ORAL
  Filled 2024-05-12: qty 2

## 2024-05-12 MED ORDER — OXYCODONE HCL 5 MG PO TABS: 5.0000 mg | ORAL_TABLET | ORAL | Status: DC | PRN

## 2024-05-12 MED ORDER — ACETAMINOPHEN 500 MG PO TABS: 1000.0000 mg | ORAL_TABLET | Freq: Three times a day (TID) | ORAL | 0 refills | Status: AC

## 2024-05-12 MED ORDER — LIDOCAINE 4 % EX PTCH: 1.0000 | MEDICATED_PATCH | Freq: Every day | CUTANEOUS | 0 refills | Status: DC | PRN

## 2024-05-12 MED ORDER — ENSURE ENLIVE PO LIQD
237.0000 mL | Freq: Two times a day (BID) | ORAL | Status: DC
Start: 2024-05-12 — End: 2024-05-12

## 2024-05-12 NOTE — Progress Notes (Deleted)
   05/12/24 1140  TOC Brief Assessment  Insurance and Status Reviewed  Patient has primary care physician  Rochelle Chu, Randa Burton, MD)  Home environment has been reviewed From home alone  Prior level of function: Independent  Prior/Current Home Services No current home services  Social Drivers of Health Review SDOH reviewed no interventions necessary  Readmission risk has been reviewed Yes  Transition of care needs no transition of care needs at this time

## 2024-05-12 NOTE — Care Management Obs Status (Signed)
 MEDICARE OBSERVATION STATUS NOTIFICATION   Patient Details  Name: Alexis Shelton MRN: [id number] Date of Birth: 07-Oct-1941   Medicare Observation Status Notification Given:  Yes    Loreda Rodriguez, RN 05/12/2024, 1:27 PM

## 2024-05-12 NOTE — TOC Initial Note (Addendum)
 Transition of Care [facility]) - Initial/Assessment Note    Patient Details  Name: Alexis Shelton MRN: [id number] Date of Birth: 1941/06/30  Transition of Care [facility]) CM/SW Contact:    Loreda Rodriguez, RN Phone Number:[phone]  05/12/2024, 12:06 PM  Clinical Narrative:                 CM at bedside to offer patient choice for [facility] services and DME . Patient has no preference. Home O2 has been set up with [employer] and to be delivered to the bedside. HH has been set up with [facility]. AVS has been updated.   Expected Discharge Plan: Home w Home Health Services Barriers to Discharge: No Barriers Identified   Patient Goals and CMS Choice Patient states their goals for this hospitalization and ongoing recovery are:: Wants to go home   Choice offered to / list presented to : Patient      Expected Discharge Plan and Services In-house Referral: NA Discharge Planning Services: CM Consult Post Acute Care Choice: Home Health, Durable Medical Equipment Living arrangements for the past 2 months: Apartment                 DME Arranged: Oxygen DME Agency: [employer] Date DME Agency Contacted: 05/12/24 Time DME Agency Contacted: 1204 Representative spoke with at DME Agency: Zula Hitch HH Arranged: PT HH Agency: [facility] Date [facility] Agency Contacted: 05/12/24 Time HH Agency Contacted: 1205 Representative spoke with at [facility] Agency: Randel Buss  Prior Living Arrangements/Services Living arrangements for the past 2 months: Apartment Lives with:: Self Patient language and need for interpreter reviewed:: Yes Do you feel safe going back to the place where you live?: Yes      Need for Family Participation in Patient Care: No (Comment) Care giver support system in place?: Yes (comment) Current home services:  (n/a) Criminal Activity/Legal Involvement Pertinent to Current Situation/Hospitalization: No - Comment as needed  Activities of Daily Living   ADL Screening (condition at time  of admission) Independently performs ADLs?: Yes (appropriate for developmental age) Is the patient deaf or have difficulty hearing?: Yes Does the patient have difficulty seeing, even when wearing glasses/contacts?: No Does the patient have difficulty concentrating, remembering, or making decisions?: Yes  Permission Sought/Granted Permission sought to share information with : Family Supports Permission granted to share information with : No              Emotional Assessment Appearance:: Appears stated age Attitude/Demeanor/Rapport: Gracious Affect (typically observed): Pleasant Orientation: : Oriented to Self, Oriented to Place, Oriented to  Time, Oriented to Situation   Psych Involvement: No (comment)  Admission diagnosis:  CAP (community acquired pneumonia) [J18.9] Closed fracture of multiple ribs of left side, initial encounter [S22.42XA] Multiple rib fractures [S22.49XA] Patient Active Problem List   Diagnosis Date Noted   Multiple rib fractures 05/11/2024   Insomnia, psychophysiological 02/24/2024   Chronic low back pain 07/03/2023   Uncontrolled daytime somnolence 05/08/2023   Primary osteoarthritis of both knees 04/29/2023   Episodic migraine 01/10/2023   Hyperlipidemia LDL goal <100 06/06/2022   Need for prophylactic vaccination and inoculation against varicella 06/06/2022   Prediabetes 10/09/2021   Primary cerebellar degeneration (HCC) 09/21/2021   Obesity 08/19/2020   Proteinuria 08/19/2020   Vitamin D  deficiency 02/11/2019   CKD stage 3b, GFR 30-44 ml/min (HCC) 02/11/2019   Mild intermittent asthma without complication 02/11/2019   Primary osteoarthritis of both hands 11/24/2018   Primary osteoarthritis of both feet 11/24/2018  DDD (degenerative disc disease), cervical 11/24/2018   DDD (degenerative disc disease), lumbar 11/24/2018   History of CVA (cerebrovascular accident) 10/13/2018   Gait abnormality 10/13/2018   Idiopathic gout of multiple sites  08/12/2018   Asthma 08/12/2018   Hypothyroidism 08/12/2018   Essential hypertension 07/04/2018   Seasonal allergic rhinitis 05/28/2017   Gouty arthropathy 09/15/2010   Osteoarthrosis, generalized, involving multiple sites 09/15/2010   Dysphagia, pharyngoesophageal 04/28/2009   Generalized osteoarthrosis of hand 04/28/2009   PCP:  Arcadio Knuckles, MD Pharmacy:   [phone] - [place], [place] - [address] AT [address] OF [facility] [address] [place] [address] Phone: [phone] Fax: [phone]     Social Drivers of Health (SDOH) Social History: SDOH Screenings   Food Insecurity: No Food Insecurity (05/11/2024)  Recent Concern: Food Insecurity - Food Insecurity Present (03/14/2024)  Housing: Low Risk  (05/11/2024)  Transportation Needs: No Transportation Needs (05/11/2024)  Utilities: Not At Risk (05/11/2024)  Alcohol Screen: Low Risk  (06/19/2023)  Depression (PHQ2-9): Low Risk  (03/03/2024)  Financial Resource Strain: Medium Risk (12/13/2023)  Physical Activity: Insufficiently Active (12/13/2023)  Social Connections: Socially Isolated (05/11/2024)  Stress: Stress Concern Present (12/13/2023)  Tobacco Use: Medium Risk (05/11/2024)   SDOH Interventions:     Readmission Risk Interventions     No data to display

## 2024-05-12 NOTE — Care Management Obs Status (Signed)
 MEDICARE OBSERVATION STATUS NOTIFICATION   Patient Details  Name: [name] MRN: [id number] Date of Birth: 07-Oct-1941   Medicare Observation Status Notification Given:  Yes    Loreda Rodriguez, RN 05/12/2024, 1:27 PM

## 2024-05-12 NOTE — Discharge Summary (Addendum)
 Physician Discharge Summary  Alexis Shelton UJW:[id number] DOB: 13-May-1941 DOA: 05/11/2024  PCP: Arcadio Knuckles, MD  Admit date: 05/11/2024 Discharge date: 05/12/2024  Admitted From: Home Discharge disposition: Home with home health PT, oxygen  Recommendations at discharge:  Continue pain control: Tylenol , oxycodone  PRN, lidocaine  patch   Brief narrative: Alexis Shelton is a 83 y.o. female with PMH significant for DM2, HTN, CKD, hypothyroidism. Lives alone, has a cat. She fell last week after tripping on a tree.  Impact to the left side of her chest which has been hurting since then. Eventually became short of breath and hence presented to the ED on 5/19  Her O2 sat was noted to be low at 87% and required supplemental oxygen 2 view chest x-ray and left shoulder x-ray:  Acute displaced left 3rd through 6th rib fractures, small left pleural effusion with left basilar atelectasis versus airspace disease Given IV antibiotics Admitted to [facility]  Subjective: Patient was seen and examined this morning. At the time of my evaluation, patient had just placed walking with PT.  O2 sat was low at 85% on room air on ambulation.  Improved with supplemental oxygen. Pain controlled with Tylenol , scheduled, lidocaine  patch Patient wants to go home  Hospital course: Multiple rib fractures secondary to fall Hypoxia Fell last week which led to acute displaced left 3rd through 6th rib fractures Had significant limitation in breathing and hence atelectasis and hypoxia Currently on pain control with scheduled Tylenol , as needed oxycodone  and lidocaine  patch. She strongly prefers to go home to take care of her cat. No evidence of pneumonia.  1 dose of antibiotic was given in the ED and was not continued.  I do not think she needs antibiotics. I would prefer her to stay in the hospital another 1 to 2 days for better pain control but patient strongly prefers to go home.   I have sent a  prescription for Tylenol , oxycodone  and lidocaine  patch Required supplemental oxygen.  Home oxygen ordered.  History of CVA Continue aspirin  statin  CKD 3B Creatinine at baseline  Hypothyroidism Continue Synthroid   Gouty arthropathy Continue allopurinol    Insomnia Uses Lunesta  as needed  Impaired mobility Seen by PT.  Goals of care   Code Status: Full Code    Diet:  Diet Order             Diet general           Diet regular Fluid consistency: Thin  Diet effective now                   Nutritional status:  Body mass index is 27.02 kg/m.       Wounds:  - Incision - 4 Ports Abdomen (Active)  Placement Date/Time: 12/11/21 1045   Location of Ports: Abdomen    Assessments 12/11/2021 11:06 AM 12/12/2021  9:00 AM  Port 1 Site Assessment -- Dry  Port 1 Margins -- Attached edges (approximated)  Port 1 Drainage Amount -- None  Port 1 Drainage Description -- No odor  Port 1 Dressing Type Liquid skin adhesive Liquid skin adhesive  Port 2 Site Assessment -- Clean;Dry  Port 2 Margins -- Attached edges (approximated)  Port 2 Drainage Amount -- None  Port 2 Drainage Description -- No odor  Port 2 Dressing Type Liquid skin adhesive Liquid skin adhesive  Port 3 Site Assessment -- Dry  Port 3 Margins -- Attached edges (approximated)  Port 3 Drainage Amount -- None  Port 3  Dressing Type Liquid skin adhesive Liquid skin adhesive  Port 3 Dressing Status -- Dry  Port 4 Site Assessment -- Clean  Port 4 Margins -- Attached edges (approximated)  Port 4 Drainage Description -- No odor  Port 4 Dressing Type Liquid skin adhesive Liquid skin adhesive  Port 4 Dressing Status -- Dry;Clean     No associated orders.    Discharge Exam:   Vitals:   05/12/24 0138 05/12/24 0539 05/12/24 0626 05/12/24 0629  BP: 139/70 (!) 152/68    Pulse: 67 66    Resp: 14 14    Temp: 98.6 F (37 C) 97.7 F (36.5 C)    TempSrc: [place]    SpO2: 95% 97% (!) 89% 96%  Weight:       Height:        Body mass index is 27.02 kg/m.  General exam: Pleasant, elderly Caucasian female.  Not in distress Skin: No rashes, lesions or ulcers. HEENT: Atraumatic, normocephalic, no obvious bleeding Lungs: Clear to auscultation bilaterally, diminished air entry in bases because of inadequate lung expansion due to rib fracture pain CVS: S1, S2, no murmur,   GI/Abd: Soft, nontender, nondistended, bowel sound present,   CNS: Alert, awake, oriented x 3 Psychiatry: Mood appropriate,  Extremities: No pedal edema, no calf tenderness,   Follow ups:    Follow-up Information     Care, [facility] Follow up.   Specialty: Home Health Services Why: Your home health has been set up with [facility]. The office will call you with start of care information. If you have any questions please call the number listed above. Contact information: [address] [place] [address] [phone]                 Discharge Instructions:   Discharge Instructions     Call MD for:  difficulty breathing, headache or visual disturbances   Complete by: As directed    Call MD for:  extreme fatigue   Complete by: As directed    Call MD for:  hives   Complete by: As directed    Call MD for:  persistant dizziness or light-headedness   Complete by: As directed    Call MD for:  persistant nausea and vomiting   Complete by: As directed    Call MD for:  severe uncontrolled pain   Complete by: As directed    Call MD for:  temperature >100.4   Complete by: As directed    Diet general   Complete by: As directed    Discharge instructions   Complete by: As directed    Recommendations at discharge:   Continue pain control: Tylenol , oxycodone  PRN, lidocaine  patch  PDMP reviewed this encounter.   Opioid taper instructions: It is important to wean off of your opioid medication as soon as possible. If you do not need pain medication after your surgery it is ok to stop day  one. Opioids include: Codeine, Hydrocodone (Norco, Vicodin), Oxycodone (Percocet, oxycontin ) and hydromorphone  amongst others.  Long term and even short term use of opiods can cause: Increased pain response Dependence Constipation Depression Respiratory depression And more.  Withdrawal symptoms can include Flu like symptoms Nausea, vomiting And more Techniques to manage these symptoms Hydrate well Eat regular healthy meals Stay active Use relaxation techniques(deep breathing, meditating, yoga) Do Not substitute Alcohol to help with tapering If you have been on opioids for less than two weeks and do not have pain than it is ok to stop  all together.  Plan to wean off of opioids This plan should start within one week post op of your joint replacement. Maintain the same interval or time between taking each dose and first decrease the dose.  Cut the total daily intake of opioids by one tablet each day Next start to increase the time between doses. The last dose that should be eliminated is the evening dose.        General discharge instructions: Follow with Primary MD Arcadio Knuckles, MD in 7 days  Please request your PCP  to go over your hospital tests, procedures, radiology results at the follow up. Please get your medicines reviewed and adjusted.  Your PCP may decide to repeat certain labs or tests as needed. Do not drive, operate heavy machinery, perform activities at heights, swimming or participation in water activities or provide baby sitting services if your were admitted for syncope or siezures until you have seen by Primary MD or a Neurologist and advised to do so again. [place]  Controlled Substance Reporting System database was reviewed. Do not drive, operate heavy machinery, perform activities at heights, swim, participate in water activities or provide baby-sitting services while on medications for pain, sleep and mood until your outpatient physician has reevaluated  you and advised to do so again.  You are strongly recommended to comply with the dose, frequency and duration of prescribed medications. Activity: As tolerated with Full fall precautions use walker/cane & assistance as needed Avoid using any recreational substances like cigarette, tobacco, alcohol, or non-prescribed drug. If you experience worsening of your admission symptoms, develop shortness of breath, life threatening emergency, suicidal or homicidal thoughts you must seek medical attention immediately by calling 911 or calling your MD immediately  if symptoms less severe. You must read complete instructions/literature along with all the possible adverse reactions/side effects for all the medicines you take and that have been prescribed to you. Take any new medicine only after you have completely understood and accepted all the possible adverse reactions/side effects.  Wear Seat belts while driving. You were cared for by a hospitalist during your hospital stay. If you have any questions about your discharge medications or the care you received while you were in the hospital after you are discharged, you can call the unit and ask to speak with the hospitalist or the covering physician. Once you are discharged, your primary care physician will handle any further medical issues. Please note that NO REFILLS for any discharge medications will be authorized once you are discharged, as it is imperative that you return to your primary care physician (or establish a relationship with a primary care physician if you do not have one).   Increase activity slowly   Complete by: As directed        Discharge Medications:   Allergies as of 05/12/2024   No Known Allergies      Medication List     STOP taking these medications    Acetaminophen  500 MG capsule Replaced by: acetaminophen  500 MG tablet   Lokelma  5 g packet Generic drug: sodium zirconium cyclosilicate    Ozempic  (0.25 or 0.5 MG/DOSE) 2  MG/3ML Sopn Generic drug: Semaglutide (0.25 or 0.5MG /DOS)   traMADol  50 MG tablet Commonly known as: ULTRAM        TAKE these medications    acetaminophen  500 MG tablet Commonly known as: TYLENOL  Take 2 tablets (1,000 mg total) by mouth 3 (three) times daily. Replaces: Acetaminophen  500 MG capsule   albuterol  108 (90 Base)  MCG/ACT inhaler Commonly known as: VENTOLIN  HFA Inhale 2 puffs into the lungs every 6 (six) hours as needed for wheezing or shortness of breath.   allopurinol  100 MG tablet Commonly known as: ZYLOPRIM  Take 1 tablet (100 mg total) by mouth 2 (two) times daily. What changed: when to take this   Aspirin  Low Dose 81 MG tablet Generic drug: aspirin  EC TAKE 1 TABLET EVERY DAY (SWALLOW WHOLE) What changed: See the new instructions.   eszopiclone  2 MG Tabs tablet Commonly known as: LUNESTA  Take 1 tablet (2 mg total) by mouth at bedtime as needed for sleep. Take immediately before bedtime What changed:  reasons to take this additional instructions   Insulin  Pen Needle 32G X 6 MM Misc 1 Act by Does not apply route once a week.   levothyroxine  88 MCG tablet Commonly known as: SYNTHROID  Take 1 tablet (88 mcg total) by mouth daily before breakfast.   lidocaine  4 % Place 1 patch onto the skin daily as needed (pain).   metoprolol  succinate 25 MG 24 hr tablet Commonly known as: TOPROL -XL TAKE 1 TABLET (25 MG TOTAL) BY MOUTH DAILY.   modafinil  100 MG tablet Commonly known as: PROVIGIL  Take 1 tablet (100 mg total) by mouth daily.   montelukast  10 MG tablet Commonly known as: SINGULAIR  TAKE 1 TABLET BY MOUTH EVERYDAY AT BEDTIME   ondansetron  4 MG disintegrating tablet Commonly known as: ZOFRAN -ODT Dissolve 1 tablet under the tongue every 8 (eight) hours as needed for nausea or vomiting. What changed: reasons to take this   oxyCODONE  5 MG immediate release tablet Commonly known as: Oxy IR/ROXICODONE  Take 1 tablet (5 mg total) by mouth every 4 (four)  hours as needed for up to 5 days for moderate pain (pain score 4-6).   rosuvastatin  10 MG tablet Commonly known as: CRESTOR  Take 10 mg by mouth daily. What changed: Another medication with the same name was removed. Continue taking this medication, and follow the directions you see here.   Symbicort  160-4.5 MCG/ACT inhaler Generic drug: budesonide -formoterol  INHALE 2 PUFFS INTO THE LUNGS DAILY AS NEEDED (SHORTNESS OF BREATH/WHEEZING). What changed: See the new instructions.   Tab-A-Vite Tabs TAKE 1 TABLET EVERY DAY   terazosin  2 MG capsule Commonly known as: HYTRIN  Take 1 capsule (2 mg total) by mouth at bedtime.   tiZANidine  4 MG tablet Commonly known as: ZANAFLEX  TAKE 1 TABLET BY MOUTH AT BEDTIME AS NEEDED FOR MUSCLE SPASMS. What changed:  how much to take how to take this when to take this reasons to take this additional instructions               Durable Medical Equipment  (From admission, onward)           Start     Ordered   05/12/24 1056  For home use only DME oxygen  Once       Question Answer Comment  Length of Need Lifetime   Mode or (Route) Nasal cannula   Liters per Minute 3   Frequency Continuous (stationary and portable oxygen unit needed)   Oxygen conserving device Yes   Oxygen delivery system Gas      05/12/24 1055             The results of significant diagnostics from this hospitalization (including imaging, microbiology, ancillary and laboratory) are listed below for reference.    Procedures and Diagnostic Studies:   DG Chest 2 View Result Date: 05/11/2024 CLINICAL DATA:  Fall, left rib and back  pain, shoulder pain EXAM: CHEST - 2 VIEW COMPARISON:  03/13/2024 FINDINGS: There are acute anterolateral left third through sixth rib fractures, better demonstrated on the comparison shoulder x-ray from today. Left basilar atelectasis versus mild airspace disease noted with blunting of left costophrenic angle compatible with a small left  effusion. No pneumothorax appreciated. No chest wall subcutaneous emphysema. Right lung remains clear. Mild cardiomegaly without CHF. Aorta is sclerotic. Trachea midline. Degenerative changes of the spine and shoulders. IMPRESSION: 1. Acute left third through sixth rib fractures. 2. Small left pleural effusion with left basilar atelectasis versus airspace disease. 3. No pneumothorax. Electronically Signed   By: Melven Stable.  Shick M.D.   On: 05/11/2024 15:09   DG Shoulder Left Result Date: 05/11/2024 CLINICAL DATA:  Recent fall, left shoulder pain EXAM: LEFT SHOULDER - 2+ VIEW COMPARISON:  03/13/2024 FINDINGS: The bones are osteopenic. Similar degenerative arthropathy of the left AC joint and glenohumeral joint with joint space loss, sclerosis and bony spurring. No acute osseous finding, fracture, or joint malalignment. No subluxation or dislocation. AC joint also non without separation. Included left chest demonstrates no acute displaced rib fractures of the left lateral third through sixth ribs. IMPRESSION: 1. Osteopenia and degenerative changes as above. 2. No acute finding involving the left shoulder by plain radiography. 3. Acute displaced left lateral third through sixth rib fractures. Electronically Signed   By: Melven Stable.  Shick M.D.   On: 05/11/2024 15:07     Labs:   Basic Metabolic Panel: Recent Labs  Lab 05/11/24 1342 05/11/24 1923  NA 140  --   K 4.3  --   CL 105  --   CO2 23  --   GLUCOSE 88  --   BUN 19  --   CREATININE 1.36* 1.15*  CALCIUM  9.7  --    GFR Estimated Creatinine Clearance: 37.9 mL/min (A) (by C-G formula based on SCr of 1.15 mg/dL (H)). Liver Function Tests: No results for input(s): "AST", "ALT", "ALKPHOS", "BILITOT", "PROT", "ALBUMIN" in the last 168 hours. No results for input(s): "LIPASE", "AMYLASE" in the last 168 hours. No results for input(s): "AMMONIA" in the last 168 hours. Coagulation profile No results for input(s): "INR", "PROTIME" in the last 168  hours.  CBC: Recent Labs  Lab 05/11/24 1342 05/11/24 1923  WBC 6.7 5.9  HGB 12.7 13.5  HCT 39.3 43.2  MCV 97.8 101.4*  PLT 227 218   Cardiac Enzymes: No results for input(s): "CKTOTAL", "CKMB", "CKMBINDEX", "TROPONINI" in the last 168 hours. BNP: Invalid input(s): "POCBNP" CBG: No results for input(s): "GLUCAP" in the last 168 hours. D-Dimer No results for input(s): "DDIMER" in the last 72 hours. Hgb A1c No results for input(s): "HGBA1C" in the last 72 hours. Lipid Profile No results for input(s): "CHOL", "HDL", "LDLCALC", "TRIG", "CHOLHDL", "LDLDIRECT" in the last 72 hours. Thyroid  function studies No results for input(s): "TSH", "T4TOTAL", "T3FREE", "THYROIDAB" in the last 72 hours.  Invalid input(s): "FREET3" Anemia work up No results for input(s): "VITAMINB12", "FOLATE", "FERRITIN", "TIBC", "IRON", "RETICCTPCT" in the last 72 hours. Microbiology No results found for this or any previous visit (from the past 240 hours).  Time coordinating discharge: 45 minutes  Signed: Virgilene Stryker  [facility] 05/12/2024, 1:03 PM

## 2024-05-12 NOTE — Evaluation (Signed)
 Physical Therapy Evaluation Patient Details Name: Alexis Shelton MRN: [id number] DOB: 06-Nov-1941 Today's Date: 05/12/2024  History of Present Illness  83 yo female comes in 05/11/24 with pain, SOB. Patient had a  fall ~ 1week prior. Xray _1.Acute left third through sixth rib fractures.  2. Small left pleural effusion with left basilar atelectasis versus  airspace disease.PMH: Bilateral TKA, PMH: HTN, idiopathic gout, RA,DDD lumbar, CKD, hypothyroid, lap chole,  Clinical Impression  Pt admitted with above diagnosis.  Pt currently with functional limitations due to the deficits listed below (see PT Problem List). Pt will benefit from acute skilled PT to increase their independence and safety with mobility to allow discharge.     The patient  reports independence, using a SPC, driving . Patient reports since fall ` 1week ago, has been able to care for self but pain limiting.  The patient resting  on 2 L [place] at 94%, drops to 85%- 90% on RA. Ambulated short distance on RA with SPO2 81%. Patient ambulated on 2 L with SPO2 91%.   Patient resides alone with intermittent family support. Patient eager to DC home due to having  a cat.  Recommend HHPT.  Encouraged use of IS.      If plan is discharge home, recommend the following: A little help with bathing/dressing/bathroom;Assistance with cooking/housework;Assist for transportation;Help with stairs or ramp for entrance   Can travel by private vehicle        Equipment Recommendations None recommended by PT  Recommendations for Other Services       Functional Status Assessment Patient has had a recent decline in their functional status and demonstrates the ability to make significant improvements in function in a reasonable and predictable amount of time.     Precautions / Restrictions Precautions Precautions: Fall Precaution/Restrictions Comments: L rib fxs Restrictions Weight Bearing Restrictions Per Provider Order: No      Mobility   Bed Mobility Overal bed mobility: Needs Assistance Bed Mobility: Rolling, Sidelying to Sit Rolling: Supervision Sidelying to sit: Min assist       General bed mobility comments: cues for log roll, assist to sit up, no assist bck to bed    Transfers Overall transfer level: Needs assistance Equipment used: Rolling walker (2 wheels) Transfers: Sit to/from Stand Sit to Stand: Supervision                Ambulation/Gait Ambulation/Gait assistance: Contact guard assist Gait Distance (Feet): 10 Feet (then 50) Assistive device: Rolling walker (2 wheels) Gait Pattern/deviations: Step-through pattern Gait velocity: decr     General Gait Details: guarded  in turning  Stairs            Wheelchair Mobility     Tilt Bed    Modified Rankin (Stroke Patients Only)       Balance Overall balance assessment: Needs assistance Sitting-balance support: Feet supported, No upper extremity supported Sitting balance-Leahy Scale: Good Sitting balance - Comments: limited by pain   Standing balance support: During functional activity, Bilateral upper extremity supported, Reliant on assistive device for balance Standing balance-Leahy Scale: Fair                               Pertinent Vitals/Pain Pain Assessment Pain Assessment: 0-10 Pain Score: 7  Pain Location: left chest Pain Descriptors / Indicators: Discomfort, Grimacing, Guarding Pain Intervention(s): Limited activity within patient's tolerance, Monitored during session, Premedicated before session, Repositioned, Patient requesting pain meds-RN notified  Home Living Family/patient expects to be discharged to:: Private residence Living Arrangements: Alone Available Help at Discharge: Available PRN/intermittently;Family;Friend(s) Type of Home: Apartment Home Access: Level entry       Home Layout: One level Home Equipment: Agricultural consultant (2 wheels);Cane - single point      Prior Function Prior Level  of Function : Independent/Modified Independent;Driving             Mobility Comments: uses a cane       Extremity/Trunk Assessment   Upper Extremity Assessment Upper Extremity Assessment: Overall WFL for tasks assessed    Lower Extremity Assessment Lower Extremity Assessment: Overall WFL for tasks assessed    Cervical / Trunk Assessment Cervical / Trunk Assessment: Kyphotic;Other exceptions Cervical / Trunk Exceptions: limited  by pain  Communication   Communication Communication: Impaired Factors Affecting Communication: Hearing impaired    Cognition Arousal: Alert Behavior During Therapy: WFL for tasks assessed/performed   PT - Cognitive impairments: No apparent impairments                         Following commands: Intact       Cueing       General Comments      Exercises     Assessment/Plan    PT Assessment Patient needs continued PT services  PT Problem List Decreased strength;Decreased activity tolerance;Decreased mobility;Cardiopulmonary status limiting activity;Decreased range of motion;Decreased knowledge of precautions       PT Treatment Interventions DME instruction;Therapeutic activities;Gait training;Functional mobility training;Patient/family education    PT Goals (Current goals can be found in the Care Plan section)  Acute Rehab PT Goals Patient Stated Goal: to go hom, has a cat PT Goal Formulation: With patient Time For Goal Achievement: 05/26/24 Potential to Achieve Goals: Good    Frequency Min 3X/week     Co-evaluation               AM-PAC PT "6 Clicks" Mobility  Outcome Measure Help needed turning from your back to your side while in a flat bed without using bedrails?: None Help needed moving from lying on your back to sitting on the side of a flat bed without using bedrails?: A Little Help needed moving to and from a bed to a chair (including a wheelchair)?: A Little Help needed standing up from a chair  using your arms (e.g., wheelchair or bedside chair)?: A Little Help needed to walk in hospital room?: A Little Help needed climbing 3-5 steps with a railing? : A Lot 6 Click Score: 18    End of Session Equipment Utilized During Treatment: Gait belt Activity Tolerance: Patient tolerated treatment well Patient left: in bed;with call bell/phone within reach;with bed alarm set Nurse Communication: Mobility status PT Visit Diagnosis: Unsteadiness on feet (R26.81);History of falling (Z91.81);Pain    Time: 0981-1914 PT Time Calculation (min) (ACUTE ONLY): 51 min   Charges:   PT Evaluation $PT Eval Low Complexity: 1 Low PT Treatments $Gait Training: 8-22 mins $Self Care/Home Management: 8-22 PT General Charges $$ ACUTE PT VISIT: 1 Visit         Abelina Hoes PT Acute Rehabilitation Services Office [phone] Weekend pager-[phone]   Dareen Ebbing 05/12/2024, 11:20 AM

## 2024-05-12 NOTE — Care Management CC44 (Signed)
 Condition Code 44 Documentation Completed  Patient Details  Name: Alexis Shelton MRN: [id number] Date of Birth: 1941/04/20   Condition Code 44 given:  Yes Patient signature on Condition Code 44 notice:  Yes Documentation of 2 MD's agreement:  Yes Code 44 added to claim:  Yes    Loreda Rodriguez, RN 05/12/2024, 1:27 PM

## 2024-05-12 NOTE — Progress Notes (Signed)
 Patient c/o through out the night. PRN medication given with no relief. Patient is very concern about her cat and is wanting to be discharged. She has asked several times what time will the doctor be here, will she be able to get a prescription  for pain medication. Patient does have the potential of leaving AMA.

## 2024-05-12 NOTE — Progress Notes (Signed)
 SATURATION QUALIFICATIONS: (This note is used to comply with regulatory documentation for home oxygen)  Patient Saturations on Room Air at Rest = 90%  Patient Saturations on Room Air while Ambulating = 81%  Patient Saturations on 2 Liters of oxygen while Ambulating = 91%  Please briefly explain why patient needs home oxygen:to maintain oxygenation > 88% while performing ADL's  like ambulation. Abelina Hoes PT Acute Rehabilitation Services Office [phone] Weekend pager-[phone]

## 2024-05-12 NOTE — Progress Notes (Signed)
   05/12/24 1140  TOC Brief Assessment  Insurance and Status Reviewed  Patient has primary care physician  Rochelle Chu, Randa Burton, MD)  Home environment has been reviewed From home alone  Prior level of function: Independent  Prior/Current Home Services No current home services  Social Drivers of Health Review SDOH reviewed no interventions necessary  Readmission risk has been reviewed Yes  Transition of care needs no transition of care needs at this time

## 2024-05-13 ENCOUNTER — Telehealth: Payer: Self-pay

## 2024-05-13 NOTE — Transitions of Care (Post Inpatient/ED Visit) (Unsigned)
   05/13/2024  Name: Alexis Shelton MRN: [id number] DOB: February 15, 1941  Today's TOC FU Call Status: Today's TOC FU Call Status:: Unsuccessful Call (1st Attempt) Unsuccessful Call (1st Attempt) Date: 05/13/24  Attempted to reach the patient regarding the most recent Inpatient/ED visit.  Follow Up Plan: Additional outreach attempts will be made to reach the patient to complete the Transitions of Care (Post Inpatient/ED visit) call.   Signature Darrall Ellison, LPN [facility] Nurse Health Advisor Direct Dial [phone]

## 2024-05-19 ENCOUNTER — Telehealth: Payer: Self-pay | Admitting: Internal Medicine

## 2024-05-19 NOTE — Telephone Encounter (Unsigned)
 Copied from CRM [id number]. Topic: Clinical - Medication Refill >> May 19, 2024  3:28 PM Alyse July wrote: Medication: Tramadol (Dosage Unknown), Ozempic (Dosage Unknown)- patient would like medication sent to [facility]  Oxycodone (Dosage Unknown) patient would [facility] [phone]   Has the patient contacted their pharmacy? Yes (Agent: If no, request that the patient contact the pharmacy for the refill. If patient does not wish to contact the pharmacy document the reason why and proceed with request.) (Agent: If yes, when and what did the pharmacy advise?)  This is the patient's preferred pharmacy:  [id number] [place], [place] - [address] AT [address]NER OF [facility] [address] [place] [address] Phone: [phone] Fax: [phone]  [facility] - [place], [place] - [address] [place] [address] Phone: [phone] Fax: [phone]  Is this the correct pharmacy for this prescription? Yes If no, delete pharmacy and type the correct one.   Has the prescription been filled recently? No  Is the patient out of the medication? Yes  Has the patient been seen for an appointment in the last year OR does the patient have an upcoming appointment? Yes  Can we respond through MyChart? Yes  Agent: Please be advised that Rx refills may take up to 3 business days. We ask that you follow-up with your pharmacy.

## 2024-05-19 NOTE — Transitions of Care (Post Inpatient/ED Visit) (Signed)
 05/19/2024  Name: Alexis Shelton MRN: [id number] DOB: March 30, 1941  Today's TOC FU Call Status: Today's TOC FU Call Status:: Successful TOC FU Call Completed Unsuccessful Call (1st Attempt) Date: 05/13/24 [facility] FU Call Complete Date: 05/19/24 Patient's Name and Date of Birth confirmed.  Transition Care Management Follow-up Telephone Call Date of Discharge: 05/12/24 Discharge Facility: [facility]) Type of Discharge: Inpatient Admission Primary Inpatient Discharge Diagnosis:: multi rib fractures How have you been since you were released from the hospital?: Better Any questions or concerns?: No  Items Reviewed: Did you receive and understand the discharge instructions provided?: Yes Medications obtained,verified, and reconciled?: Yes (Medications Reviewed) Any new allergies since your discharge?: No Dietary orders reviewed?: NA Do you have support at home?: No  Medications Reviewed Today: Medications Reviewed Today     Reviewed by Darrall Ellison, LPN (Licensed Practical Nurse) on 05/19/24 at 1438  Med List Status: <None>   Medication Order Taking? Sig Documenting Provider Last Dose Status Informant  acetaminophen  (TYLENOL ) 500 MG tablet [id number]  Take 2 tablets (1,000 mg total) by mouth 3 (three) times daily. Hoyt Macleod, MD  Active   albuterol  (VENTOLIN  HFA) 108 (90 Base) MCG/ACT inhaler [id number]  Inhale 2 puffs into the lungs every 6 (six) hours as needed for wheezing or shortness of breath. [provider]  Active Self  allopurinol  (ZYLOPRIM ) 100 MG tablet [id number]  TAKE 1 TABLET BY MOUTH TWICE A DAY Arcadio Knuckles, MD  Active   ASPIRIN  LOW DOSE 81 MG tablet [id number] No TAKE 1 TABLET EVERY DAY (SWALLOW WHOLE)  Patient taking differently: Take 81 mg by mouth in the morning.   Arcadio Knuckles, MD 05/10/2024  9:00 AM Active Self  eszopiclone  (LUNESTA ) 2 MG TABS tablet [id number] No Take 1 tablet (2 mg total) by mouth at bedtime as needed for sleep. Take  immediately before bedtime  Patient taking differently: Take 2 mg by mouth at bedtime as needed for sleep (immediately before bedtime).   Arcadio Knuckles, MD Unknown Active Self           Med Note Guido Leeks, Danney Dutton May 11, 2024  6:35 PM)    Insulin  Pen Needle 32G X 6 MM MISC [id number] No 1 Act by Does not apply route once a week. Arcadio Knuckles, MD Taking Active   levothyroxine  (SYNTHROID ) 88 MCG tablet [id number]  TAKE 1 TABLET BY MOUTH DAILY BEFORE BREAKFAST. Arcadio Knuckles, MD  Active   lidocaine  4 % [id number]  Place 1 patch onto the skin daily as needed (pain). Dahal, Binaya, MD  Active   metoprolol  succinate (TOPROL -XL) 25 MG 24 hr tablet [id number]  TAKE 1 TABLET (25 MG TOTAL) BY MOUTH DAILY. Arcadio Knuckles, MD  Active   modafinil  (PROVIGIL ) 100 MG tablet [id number] No Take 1 tablet (100 mg total) by mouth daily. Arcadio Knuckles, MD 05/10/2024 Morning Active Self  montelukast  (SINGULAIR ) 10 MG tablet [id number] No TAKE 1 TABLET BY MOUTH EVERYDAY AT BEDTIME Arcadio Knuckles, MD 05/10/2024 Bedtime Active Self  Multiple Vitamin (TAB-A-VITE) TABS [id number] No TAKE 1 TABLET EVERY DAY Arcadio Knuckles, MD 05/10/2024 Morning Active Self  ondansetron  (ZOFRAN -ODT) 4 MG disintegrating tablet [id number] No Dissolve 1 tablet under the tongue every 8 (eight) hours as needed for nausea or vomiting.  Patient taking differently: Take 4 mg by mouth every 8 (eight) hours as needed for nausea or vomiting (dissolve orally).   Arcadio Knuckles, MD Past Month Active Self  rosuvastatin  (CRESTOR ) 10 MG tablet [id number] No Take 10 mg by mouth daily. [provider] 05/11/2024 Active Self  SYMBICORT  160-4.5 MCG/ACT inhaler [id number] No INHALE 2 PUFFS INTO THE LUNGS DAILY AS NEEDED (SHORTNESS OF BREATH/WHEEZING).  Patient taking differently: Inhale 2 puffs into the lungs daily as needed (for flares).   Arcadio Knuckles, MD 05/10/2024 Active Self  terazosin  (HYTRIN ) 2 MG capsule [id number]  TAKE 1 CAPSULE BY MOUTH AT  BEDTIME. Arcadio Knuckles, MD  Active   tiZANidine  (ZANAFLEX ) 4 MG tablet [id number]  TAKE 1 TABLET BY MOUTH AT BEDTIME AS NEEDED FOR MUSCLE SPASMS.  Patient taking differently: Take 4 mg by mouth at bedtime as needed for muscle spasms.   Arcadio Knuckles, MD  Active Self            Home Care and Equipment/Supplies: Were Home Health Services Ordered?: NA Any new equipment or medical supplies ordered?: NA  Functional Questionnaire: Do you need assistance with bathing/showering or dressing?: No Do you need assistance with meal preparation?: No Do you need assistance with eating?: No Do you have difficulty maintaining continence: No Do you need assistance with getting out of bed/getting out of a chair/moving?: No Do you have difficulty managing or taking your medications?: No  Follow up appointments reviewed: PCP Follow-up appointment confirmed?: Yes Date of PCP follow-up appointment?: 05/21/24 Follow-up Provider: [facility] Follow-up appointment confirmed?: NA Do you need transportation to your follow-up appointment?: No Do you understand care options if your condition(s) worsen?: Yes-patient verbalized understanding    SIGNATURE Darrall Ellison, LPN [facility] Nurse Health Advisor Direct Dial [phone]

## 2024-05-20 ENCOUNTER — Encounter: Payer: Self-pay | Admitting: Internal Medicine

## 2024-05-20 NOTE — Progress Notes (Unsigned)
 Subjective:    Patient ID: Alexis Shelton, female    DOB: Jul 31, 1941, 83 y.o.   MRN: [id number]     HPI [name] is here for follow up from the hospital.  She is here by herself.    Admitted 5/19 - 5/20  Discharged home with home PT w Alexis Shelton, oxygen  Alexis Shelton has hx of  htn, CKd, hypothyroidism.  She lives alone.    She fell two weeks ago after tripping on a tree.  She fell on her left chest and had pain, but was tolerating it at home.  She had worsening SOB which caused her to go to the ED on 5/19.    O2 sat was 87% - started on supplemental oxygen.  Cxr and left shoulder xray - acute displaced L 3-6 rib fx, small left pleural effusion and left basilar atelectasis vs airspace disease.  Given IV abx.   Multiple rib fractures, hypoxia -  Related to fall the week prior Displaced left 3-6th rib fractures Limitation in breathing -> atelectasis and hypoxia Pain controlled with scheduled tylenol , prn oxycodone  and lidocaine  patch No evidence of PNA- 1 dose of abx given in ED only She wanted to go home - advised to stay another 1-2 days Discharged on tylenol , oxycodone  and lidocaine  patch Required supplemental oxygen - home oxygen ordered  Hx CVA, ckd 3b, hypothyroidism, gout, insomnia -  stable and continued on home medications  They advised stopping her tramadol , ozempic , lokelma .    Her pain is not better.  She ran out of pain medication 4 days ago.  She is only taking tylenol   Uses oxygen O2 at night.  She is monitoring her oxygen at home  Taking oxycodone  5 mg Q 4 hr prn ( ran out), tylenol  and lidocaine  patches for pain.   Medications and allergies reviewed with patient and updated if appropriate.  Current Outpatient Medications on File Prior to Visit  Medication Sig Dispense Refill   acetaminophen  (TYLENOL ) 500 MG tablet Take 2 tablets (1,000 mg total) by mouth 3 (three) times daily. 30 tablet 0   albuterol  (VENTOLIN  HFA) 108 (90 Base) MCG/ACT inhaler Inhale 2  puffs into the lungs every 6 (six) hours as needed for wheezing or shortness of breath.     allopurinol  (ZYLOPRIM ) 100 MG tablet TAKE 1 TABLET BY MOUTH TWICE A DAY 180 tablet 3   ASPIRIN  LOW DOSE 81 MG tablet TAKE 1 TABLET EVERY DAY (SWALLOW WHOLE) (Patient taking differently: Take 81 mg by mouth in the morning.) 90 tablet 3   eszopiclone  (LUNESTA ) 2 MG TABS tablet Take 1 tablet (2 mg total) by mouth at bedtime as needed for sleep. Take immediately before bedtime (Patient taking differently: Take 2 mg by mouth at bedtime as needed for sleep (immediately before bedtime).) 90 tablet 0   Insulin  Pen Needle 32G X 6 MM MISC 1 Act by Does not apply route once a week. 30 each 1   levothyroxine  (SYNTHROID ) 88 MCG tablet TAKE 1 TABLET BY MOUTH DAILY BEFORE BREAKFAST. 90 tablet 3   lidocaine  4 % Place 1 patch onto the skin daily as needed (pain). 30 patch 0   metoprolol  succinate (TOPROL -XL) 25 MG 24 hr tablet TAKE 1 TABLET (25 MG TOTAL) BY MOUTH DAILY. 90 tablet 3   modafinil  (PROVIGIL ) 100 MG tablet Take 1 tablet (100 mg total) by mouth daily. 30 tablet 2   montelukast  (SINGULAIR ) 10 MG tablet TAKE 1 TABLET BY MOUTH EVERYDAY AT BEDTIME 90  tablet 0   Multiple Vitamin (TAB-A-VITE) TABS TAKE 1 TABLET EVERY DAY 90 tablet 3   ondansetron  (ZOFRAN -ODT) 4 MG disintegrating tablet Dissolve 1 tablet under the tongue every 8 (eight) hours as needed for nausea or vomiting. (Patient taking differently: Take 4 mg by mouth every 8 (eight) hours as needed for nausea or vomiting (dissolve orally).) 12 tablet 4   rosuvastatin  (CRESTOR ) 10 MG tablet Take 10 mg by mouth daily.     SYMBICORT  160-4.5 MCG/ACT inhaler INHALE 2 PUFFS INTO THE LUNGS DAILY AS NEEDED (SHORTNESS OF BREATH/WHEEZING). (Patient taking differently: Inhale 2 puffs into the lungs daily as needed (for flares).) 30.6 each 1   terazosin  (HYTRIN ) 2 MG capsule TAKE 1 CAPSULE BY MOUTH AT BEDTIME. 90 capsule 3   tiZANidine  (ZANAFLEX ) 4 MG tablet TAKE 1 TABLET BY  MOUTH AT BEDTIME AS NEEDED FOR MUSCLE SPASMS. (Patient taking differently: Take 4 mg by mouth at bedtime as needed for muscle spasms.) 90 tablet 1   No current facility-administered medications on file prior to visit.     Review of Systems  Constitutional:  Negative for fever.  Respiratory:  Positive for cough (occ - brings up mucus once a day) and shortness of breath (after walking around a little while). Negative for wheezing.   Cardiovascular:  Positive for palpitations (once yesterday). Negative for chest pain and leg swelling.  Musculoskeletal:  Positive for arthralgias.  Neurological:  Negative for light-headedness and headaches.       Objective:   Vitals:   05/21/24 1104  BP: (!) 140/78  Pulse: 60  Temp: 97.9 F (36.6 C)  SpO2: 91%   BP Readings from Last 3 Encounters:  05/21/24 (!) 140/78  05/12/24 (!) 166/74  04/15/24 (!) 142/52   Wt Readings from Last 3 Encounters:  05/21/24 167 lb 6.4 oz (75.9 kg)  05/11/24 167 lb 6.4 oz (75.9 kg)  04/15/24 170 lb 9.6 oz (77.4 kg)   Body mass index is 27.02 kg/m.    Physical Exam Constitutional:      General: She is not in acute distress.    Appearance: Normal appearance. She is not ill-appearing.  HENT:     Head: Normocephalic and atraumatic.  Cardiovascular:     Rate and Rhythm: Normal rate and regular rhythm.  Pulmonary:     Effort: Pulmonary effort is normal. No respiratory distress.     Breath sounds: Normal breath sounds. No wheezing or rales.  Musculoskeletal:        General: Tenderness (left anterior-lateral rib pain - mid ribs) present.     Right lower leg: No edema.     Left lower leg: No edema.  Skin:    General: Skin is warm and dry.     Findings: No bruising, erythema or rash.  Neurological:     Mental Status: She is alert.        Lab Results  Component Value Date   WBC 5.9 05/11/2024   HGB 13.5 05/11/2024   HCT 43.2 05/11/2024   PLT 218 05/11/2024   GLUCOSE 88 05/11/2024   CHOL 194  08/07/2023   TRIG 125.0 08/07/2023   HDL 51.40 08/07/2023   LDLDIRECT 175.0 06/06/2022   LDLCALC 117 (H) 08/07/2023   ALT 40 03/13/2024   AST 42 (H) 03/13/2024   NA 140 05/11/2024   K 4.3 05/11/2024   CL 105 05/11/2024   CREATININE 1.15 (H) 05/11/2024   BUN 19 05/11/2024   CO2 23 05/11/2024   TSH 0.90 02/24/2024  INR 1.0 03/14/2024   HGBA1C 5.9 02/24/2024   MICROALBUR 44.7 (H) 02/24/2024   DG Chest 2 View CLINICAL DATA:  Fall, left rib and back pain, shoulder pain  EXAM: CHEST - 2 VIEW  COMPARISON:  03/13/2024  FINDINGS: There are acute anterolateral left third through sixth rib fractures, better demonstrated on the comparison shoulder x-ray from today. Left basilar atelectasis versus mild airspace disease noted with blunting of left costophrenic angle compatible with a small left effusion. No pneumothorax appreciated. No chest wall subcutaneous emphysema.  Right lung remains clear. Mild cardiomegaly without CHF. Aorta is sclerotic. Trachea midline. Degenerative changes of the spine and shoulders.  IMPRESSION: 1. Acute left third through sixth rib fractures. 2. Small left pleural effusion with left basilar atelectasis versus airspace disease. 3. No pneumothorax.  Electronically Signed   By: Melven Stable.  Shick M.D.   On: 05/11/2024 15:09 DG Shoulder Left CLINICAL DATA:  Recent fall, left shoulder pain  EXAM: LEFT SHOULDER - 2+ VIEW  COMPARISON:  03/13/2024  FINDINGS: The bones are osteopenic. Similar degenerative arthropathy of the left AC joint and glenohumeral joint with joint space loss, sclerosis and bony spurring. No acute osseous finding, fracture, or joint malalignment. No subluxation or dislocation. AC joint also non without separation.  Included left chest demonstrates no acute displaced rib fractures of the left lateral third through sixth ribs.  IMPRESSION: 1. Osteopenia and degenerative changes as above. 2. No acute finding involving the left  shoulder by plain radiography. 3. Acute displaced left lateral third through sixth rib fractures.  Electronically Signed   By: Melven Stable.  Shick M.D.   On: 05/11/2024 15:07    Assessment & Plan:    Multiple rib fractures, hypoxia: Acute Result of a fall Displaced left 3-6 ribs Placed on supplemental oxygen - using it at night and during the day as needed Continue to monitor oxygen at home - will likely be able to get off oxygen in a couple -few weeks Pain management with oxycodone  5 mg Q 4 hr prn, tylenol  and lidocaine  patches Discussed side effects of oxycodone  Oxycodone  and lidocaine  patches sent to pharmacy Has f/u with Dr Rochelle Chu in a couple of weeks   Hypertension: Chronic BP slightly elevated here today - ok for age and likely elevated due to pain Continue metoprolol  xl 25 mg daily, hytrin  2 mg daily  Has not heard from [facility] yet, but has their number  -- most likely will not need PT

## 2024-05-20 NOTE — Assessment & Plan Note (Signed)
 Displaced fractures of left ribs 3-6 after fall Atelectasis resulted in hypoxia Started on supplemental oxygen - d/c'd on oxygen Pain control with tylenol , oxycodone , lidocaine  patches

## 2024-05-21 ENCOUNTER — Telehealth: Payer: Self-pay | Admitting: Internal Medicine

## 2024-05-21 ENCOUNTER — Ambulatory Visit: Admitting: Internal Medicine

## 2024-05-21 VITALS — BP 140/78 | HR 60 | Temp 97.9°F | Ht 66.0 in | Wt 167.4 lb

## 2024-05-21 DIAGNOSIS — I10 Essential (primary) hypertension: Secondary | ICD-10-CM | POA: Diagnosis not present

## 2024-05-21 DIAGNOSIS — S2242XA Multiple fractures of ribs, left side, initial encounter for closed fracture: Secondary | ICD-10-CM | POA: Diagnosis not present

## 2024-05-21 MED ORDER — LIDOCAINE 4 % EX PTCH: 1.0000 | MEDICATED_PATCH | Freq: Every day | CUTANEOUS | 0 refills | Status: DC | PRN

## 2024-05-21 MED ORDER — OXYCODONE HCL 5 MG PO TABS: 5.0000 mg | ORAL_TABLET | ORAL | 0 refills | Status: AC | PRN

## 2024-05-21 NOTE — Patient Instructions (Addendum)
      Medications changes include :   oxycodone  5 mg every 4 hours as needed, lidocaine  patches    Take up to 3000 mg of Tylenol  a day.      Return for follow up as scheduled with Dr Rochelle Chu.

## 2024-05-21 NOTE — Telephone Encounter (Signed)
 Copied from CRM [id number]. Topic: Clinical - Home Health Verbal Orders >> May 20, 2024  2:42 PM [name] wrote: Caller/Agency: Shelvy Dickens from [facility]  Callback Number: [phone] Service Requested:  Frequency: Shelvy Dickens calling to let Dr. Rochelle Chu know that patient requested to delay her care until this Friday 5/30 Any new concerns about the patient? No

## 2024-05-22 ENCOUNTER — Other Ambulatory Visit: Payer: Self-pay

## 2024-05-22 ENCOUNTER — Telehealth: Payer: Self-pay | Admitting: Internal Medicine

## 2024-05-22 DIAGNOSIS — M51369 Other intervertebral disc degeneration, lumbar region without mention of lumbar back pain or lower extremity pain: Secondary | ICD-10-CM

## 2024-05-22 DIAGNOSIS — I129 Hypertensive chronic kidney disease with stage 1 through stage 4 chronic kidney disease, or unspecified chronic kidney disease: Secondary | ICD-10-CM | POA: Diagnosis not present

## 2024-05-22 DIAGNOSIS — M1009 Idiopathic gout, multiple sites: Secondary | ICD-10-CM | POA: Diagnosis not present

## 2024-05-22 DIAGNOSIS — S2242XD Multiple fractures of ribs, left side, subsequent encounter for fracture with routine healing: Secondary | ICD-10-CM | POA: Diagnosis not present

## 2024-05-22 DIAGNOSIS — E1122 Type 2 diabetes mellitus with diabetic chronic kidney disease: Secondary | ICD-10-CM | POA: Diagnosis not present

## 2024-05-22 DIAGNOSIS — J9811 Atelectasis: Secondary | ICD-10-CM | POA: Diagnosis not present

## 2024-05-22 DIAGNOSIS — M19012 Primary osteoarthritis, left shoulder: Secondary | ICD-10-CM | POA: Diagnosis not present

## 2024-05-22 DIAGNOSIS — J90 Pleural effusion, not elsewhere classified: Secondary | ICD-10-CM | POA: Diagnosis not present

## 2024-05-22 DIAGNOSIS — N1832 Chronic kidney disease, stage 3b: Secondary | ICD-10-CM | POA: Diagnosis not present

## 2024-05-22 DIAGNOSIS — M5030 Other cervical disc degeneration, unspecified cervical region: Secondary | ICD-10-CM

## 2024-05-22 DIAGNOSIS — M069 Rheumatoid arthritis, unspecified: Secondary | ICD-10-CM | POA: Diagnosis not present

## 2024-05-22 MED ORDER — TIZANIDINE HCL 4 MG PO TABS: ORAL_TABLET | ORAL | 1 refills | Status: AC

## 2024-05-22 MED ORDER — MONTELUKAST SODIUM 10 MG PO TABS: ORAL_TABLET | ORAL | 0 refills | Status: DC

## 2024-05-22 NOTE — Telephone Encounter (Signed)
 Copied from CRM [id number]. Topic: Clinical - Home Health Verbal Orders >> May 22, 2024  2:56 PM Varney Gentleman wrote: Caller/Agency: Chari Como Number: [phone] Secure Line Service Requested: Physical Therapy Frequency: 2 week 2, 1 week 2 Any new concerns about the patient? Yes, Medication review, had insulin  on medication list but patient states she's never taken insulin  or had diabetes, was given in hospital per patient and discharge paperwork but not taking it at home.

## 2024-05-22 NOTE — Telephone Encounter (Signed)
 Is this okay?

## 2024-05-22 NOTE — Telephone Encounter (Signed)
 Can I give the verbals ?

## 2024-05-22 NOTE — Telephone Encounter (Signed)
Meds has been refilled. 

## 2024-05-27 NOTE — Telephone Encounter (Signed)
Verbal orders left on secure voicemail.  °

## 2024-05-28 DIAGNOSIS — J9811 Atelectasis: Secondary | ICD-10-CM | POA: Diagnosis not present

## 2024-05-28 DIAGNOSIS — I129 Hypertensive chronic kidney disease with stage 1 through stage 4 chronic kidney disease, or unspecified chronic kidney disease: Secondary | ICD-10-CM | POA: Diagnosis not present

## 2024-05-28 DIAGNOSIS — J90 Pleural effusion, not elsewhere classified: Secondary | ICD-10-CM | POA: Diagnosis not present

## 2024-05-28 DIAGNOSIS — N1832 Chronic kidney disease, stage 3b: Secondary | ICD-10-CM | POA: Diagnosis not present

## 2024-05-28 DIAGNOSIS — M1009 Idiopathic gout, multiple sites: Secondary | ICD-10-CM | POA: Diagnosis not present

## 2024-05-28 DIAGNOSIS — M069 Rheumatoid arthritis, unspecified: Secondary | ICD-10-CM | POA: Diagnosis not present

## 2024-05-28 DIAGNOSIS — S2242XD Multiple fractures of ribs, left side, subsequent encounter for fracture with routine healing: Secondary | ICD-10-CM | POA: Diagnosis not present

## 2024-05-28 DIAGNOSIS — M19012 Primary osteoarthritis, left shoulder: Secondary | ICD-10-CM | POA: Diagnosis not present

## 2024-05-28 DIAGNOSIS — E1122 Type 2 diabetes mellitus with diabetic chronic kidney disease: Secondary | ICD-10-CM | POA: Diagnosis not present

## 2024-05-29 DIAGNOSIS — S2242XD Multiple fractures of ribs, left side, subsequent encounter for fracture with routine healing: Secondary | ICD-10-CM | POA: Diagnosis not present

## 2024-05-29 DIAGNOSIS — N1832 Chronic kidney disease, stage 3b: Secondary | ICD-10-CM | POA: Diagnosis not present

## 2024-05-29 DIAGNOSIS — M069 Rheumatoid arthritis, unspecified: Secondary | ICD-10-CM | POA: Diagnosis not present

## 2024-05-29 DIAGNOSIS — M1009 Idiopathic gout, multiple sites: Secondary | ICD-10-CM | POA: Diagnosis not present

## 2024-05-29 DIAGNOSIS — J9811 Atelectasis: Secondary | ICD-10-CM | POA: Diagnosis not present

## 2024-05-29 DIAGNOSIS — M19012 Primary osteoarthritis, left shoulder: Secondary | ICD-10-CM | POA: Diagnosis not present

## 2024-05-29 DIAGNOSIS — J90 Pleural effusion, not elsewhere classified: Secondary | ICD-10-CM | POA: Diagnosis not present

## 2024-05-29 DIAGNOSIS — I129 Hypertensive chronic kidney disease with stage 1 through stage 4 chronic kidney disease, or unspecified chronic kidney disease: Secondary | ICD-10-CM | POA: Diagnosis not present

## 2024-05-29 DIAGNOSIS — E1122 Type 2 diabetes mellitus with diabetic chronic kidney disease: Secondary | ICD-10-CM | POA: Diagnosis not present

## 2024-06-02 DIAGNOSIS — I129 Hypertensive chronic kidney disease with stage 1 through stage 4 chronic kidney disease, or unspecified chronic kidney disease: Secondary | ICD-10-CM | POA: Diagnosis not present

## 2024-06-02 DIAGNOSIS — M069 Rheumatoid arthritis, unspecified: Secondary | ICD-10-CM | POA: Diagnosis not present

## 2024-06-02 DIAGNOSIS — J9811 Atelectasis: Secondary | ICD-10-CM | POA: Diagnosis not present

## 2024-06-02 DIAGNOSIS — M1009 Idiopathic gout, multiple sites: Secondary | ICD-10-CM | POA: Diagnosis not present

## 2024-06-02 DIAGNOSIS — N1832 Chronic kidney disease, stage 3b: Secondary | ICD-10-CM | POA: Diagnosis not present

## 2024-06-02 DIAGNOSIS — M19012 Primary osteoarthritis, left shoulder: Secondary | ICD-10-CM | POA: Diagnosis not present

## 2024-06-02 DIAGNOSIS — J90 Pleural effusion, not elsewhere classified: Secondary | ICD-10-CM | POA: Diagnosis not present

## 2024-06-02 DIAGNOSIS — E1122 Type 2 diabetes mellitus with diabetic chronic kidney disease: Secondary | ICD-10-CM | POA: Diagnosis not present

## 2024-06-02 DIAGNOSIS — S2242XD Multiple fractures of ribs, left side, subsequent encounter for fracture with routine healing: Secondary | ICD-10-CM | POA: Diagnosis not present

## 2024-06-03 ENCOUNTER — Ambulatory Visit (INDEPENDENT_AMBULATORY_CARE_PROVIDER_SITE_OTHER): Admitting: Internal Medicine

## 2024-06-03 ENCOUNTER — Encounter: Payer: Self-pay | Admitting: Internal Medicine

## 2024-06-03 ENCOUNTER — Ambulatory Visit: Payer: Self-pay | Admitting: Internal Medicine

## 2024-06-03 ENCOUNTER — Ambulatory Visit (INDEPENDENT_AMBULATORY_CARE_PROVIDER_SITE_OTHER)

## 2024-06-03 VITALS — BP 142/68 | HR 72 | Temp 98.6°F | Ht 66.0 in | Wt 168.0 lb

## 2024-06-03 DIAGNOSIS — R1033 Periumbilical pain: Secondary | ICD-10-CM | POA: Diagnosis not present

## 2024-06-03 DIAGNOSIS — S2242XD Multiple fractures of ribs, left side, subsequent encounter for fracture with routine healing: Secondary | ICD-10-CM

## 2024-06-03 DIAGNOSIS — J4520 Mild intermittent asthma, uncomplicated: Secondary | ICD-10-CM | POA: Diagnosis not present

## 2024-06-03 DIAGNOSIS — E039 Hypothyroidism, unspecified: Secondary | ICD-10-CM | POA: Diagnosis not present

## 2024-06-03 DIAGNOSIS — N1832 Chronic kidney disease, stage 3b: Secondary | ICD-10-CM | POA: Diagnosis not present

## 2024-06-03 DIAGNOSIS — R0602 Shortness of breath: Secondary | ICD-10-CM | POA: Diagnosis not present

## 2024-06-03 DIAGNOSIS — F411 Generalized anxiety disorder: Secondary | ICD-10-CM

## 2024-06-03 LAB — URINALYSIS, ROUTINE W REFLEX MICROSCOPIC
Bilirubin Urine: NEGATIVE
Hgb urine dipstick: NEGATIVE
Ketones, ur: NEGATIVE
Nitrite: NEGATIVE
Specific Gravity, Urine: 1.025 (ref 1.000–1.030)
Total Protein, Urine: 30 — AB
Urine Glucose: NEGATIVE
Urobilinogen, UA: 0.2 (ref 0.0–1.0)
pH: 6 (ref 5.0–8.0)

## 2024-06-03 LAB — BASIC METABOLIC PANEL WITH GFR
BUN: 20 mg/dL (ref 6–23)
CO2: 29 meq/L (ref 19–32)
Calcium: 9.5 mg/dL (ref 8.4–10.5)
Chloride: 104 meq/L (ref 96–112)
Creatinine, Ser: 1.36 mg/dL — ABNORMAL HIGH (ref 0.40–1.20)
GFR: 35.7 mL/min — ABNORMAL LOW (ref >60.00)
Glucose, Bld: 89 mg/dL (ref 70–99)
Potassium: 4.9 meq/L (ref 3.5–5.1)
Sodium: 140 meq/L (ref 135–145)

## 2024-06-03 LAB — AMYLASE: Amylase: 85 U/L (ref 27–131)

## 2024-06-03 LAB — LIPASE: Lipase: 28 U/L (ref 11.0–59.0)

## 2024-06-03 MED ORDER — BUDESONIDE-FORMOTEROL FUMARATE 160-4.5 MCG/ACT IN AERO
2.0000 | INHALATION_SPRAY | Freq: Two times a day (BID) | RESPIRATORY_TRACT | 1 refills | Status: DC
Start: 2024-06-03 — End: 2024-07-23

## 2024-06-03 MED ORDER — CLONAZEPAM 0.5 MG PO TABS: 0.5000 mg | ORAL_TABLET | Freq: Two times a day (BID) | ORAL | 0 refills | Status: DC | PRN

## 2024-06-03 NOTE — Patient Instructions (Signed)
 Abdominal Pain, Adult  Pain in the abdomen (abdominal pain) can be caused by many things. In most cases, it gets better with no treatment or by being treated at home. But in some cases, it can be serious. Your health care provider will ask questions about your medical history and do a physical exam to try to figure out what is causing your pain. Follow these instructions at home: Medicines Take over-the-counter and prescription medicines only as told by your provider. Do not take medicines that help you poop (laxatives) unless told by your provider. General instructions Watch your condition for any changes. Drink enough fluid to keep your pee (urine) pale yellow. Contact a health care provider if: Your pain changes, gets worse, or lasts longer than expected. You have severe cramping or bloating in your abdomen, or you vomit. Your pain gets worse with meals, after eating, or with certain foods. You are constipated or have diarrhea for more than 2-3 days. You are not hungry, or you lose weight without trying. You have signs of dehydration. These may include: Dark pee, very little pee, or no pee. Cracked lips or dry mouth. Sleepiness or weakness. You have pain when you pee (urinate) or poop. Your abdominal pain wakes you up at night. You have blood in your pee. You have a fever. Get help right away if: You cannot stop vomiting. Your pain is only in one part of the abdomen. Pain on the right side could be caused by appendicitis. You have bloody or black poop (stool), or poop that looks like tar. You have trouble breathing. You have chest pain. These symptoms may be an emergency. Get help right away. Call 911. Do not wait to see if the symptoms will go away. Do not drive yourself to the hospital. This information is not intended to replace advice given to you by your health care provider. Make sure you discuss any questions you have with your health care provider. Document Revised:  09/26/2022 Document Reviewed: 09/26/2022 Elsevier Patient Education  2024 [employer].

## 2024-06-03 NOTE — Progress Notes (Signed)
 Subjective:  Patient ID: Alexis Shelton, female    DOB: 17-Jul-1941  Age: 83 y.o. MRN: [id number]  CC: Medical Management of Chronic Issues (3 month follow up. PT would like to discuss Ozempic  medication with you )   HPI Alexis Shelton presents for f/up ---  Discussed the use of AI scribe software for clinical note transcription with the patient, who gave verbal consent to proceed.  History of Present Illness   Alexis Shelton is an 83 year old female who presents with rib pain and difficulty breathing following a fall.  She experienced a fall resulting in four rib fractures, with pain localized under the left ribcage. The pain has been improving over time, and she has managed it without fully utilizing prescribed pain medication. However, she experienced significant pain yesterday but has not required pain medication for the past several nights. She reports difficulty breathing and is currently on oxygen therapy. She can take deep breaths, although it causes pain. No hemoptysis, but there is some phlegm production.  She has been experiencing nausea, which she attributes to her use of Ozempic , and has had one episode of vomiting. Occasional abdominal pain is noted, resembling gallbladder pain, despite her gallbladder having been removed. She describes a significant change in taste perception, finding even water unpleasant. Her diet is limited to fresh fruits, fruit drinks, and yogurt. Foods she previously enjoyed, like salty foods, are now too salty. She also reports irritable bowel symptoms.  She expresses feelings of sleepiness, tiredness, and overwhelming fear, particularly upon waking. Dreams about her deceased mother and sister contribute to her anxiety. She fears excessive sleep might lead to death, though she acknowledges this fear as illogical.       Outpatient Medications Prior to Visit  Medication Sig Dispense Refill   acetaminophen  (TYLENOL ) 500 MG tablet Take 2  tablets (1,000 mg total) by mouth 3 (three) times daily. 30 tablet 0   albuterol  (VENTOLIN  HFA) 108 (90 Base) MCG/ACT inhaler Inhale 2 puffs into the lungs every 6 (six) hours as needed for wheezing or shortness of breath.     allopurinol  (ZYLOPRIM ) 100 MG tablet TAKE 1 TABLET BY MOUTH TWICE A DAY 180 tablet 3   ASPIRIN  LOW DOSE 81 MG tablet TAKE 1 TABLET EVERY DAY (SWALLOW WHOLE) (Patient taking differently: Take 81 mg by mouth in the morning.) 90 tablet 3   eszopiclone  (LUNESTA ) 2 MG TABS tablet Take 1 tablet (2 mg total) by mouth at bedtime as needed for sleep. Take immediately before bedtime (Patient taking differently: Take 2 mg by mouth at bedtime as needed for sleep (immediately before bedtime).) 90 tablet 0   Insulin  Pen Needle 32G X 6 MM MISC 1 Act by Does not apply route once a week. 30 each 1   lidocaine  4 % Place 1 patch onto the skin daily as needed (pain). 30 patch 0   metoprolol  succinate (TOPROL -XL) 25 MG 24 hr tablet TAKE 1 TABLET (25 MG TOTAL) BY MOUTH DAILY. 90 tablet 3   montelukast  (SINGULAIR ) 10 MG tablet TAKE 1 TABLET BY MOUTH EVERYDAY AT BEDTIME 90 tablet 0   Multiple Vitamin (TAB-A-VITE) TABS TAKE 1 TABLET EVERY DAY 90 tablet 3   ondansetron  (ZOFRAN -ODT) 4 MG disintegrating tablet Dissolve 1 tablet under the tongue every 8 (eight) hours as needed for nausea or vomiting. (Patient taking differently: Take 4 mg by mouth every 8 (eight) hours as needed for nausea or vomiting (dissolve orally).) 12 tablet 4   rosuvastatin  (CRESTOR ) 10 MG  tablet Take 10 mg by mouth daily.     terazosin  (HYTRIN ) 2 MG capsule TAKE 1 CAPSULE BY MOUTH AT BEDTIME. 90 capsule 3   tiZANidine  (ZANAFLEX ) 4 MG tablet TAKE 1 TABLET BY MOUTH AT BEDTIME AS NEEDED FOR MUSCLE SPASMS. 90 tablet 1   levothyroxine  (SYNTHROID ) 88 MCG tablet TAKE 1 TABLET BY MOUTH DAILY BEFORE BREAKFAST. 90 tablet 3   modafinil  (PROVIGIL ) 100 MG tablet Take 1 tablet (100 mg total) by mouth daily. 30 tablet 2   OZEMPIC , 0.25 OR 0.5  MG/DOSE, 2 MG/3ML SOPN SMARTSIG:0.25 unspecified SUB-Q Once a Week     SYMBICORT  160-4.5 MCG/ACT inhaler INHALE 2 PUFFS INTO THE LUNGS DAILY AS NEEDED (SHORTNESS OF BREATH/WHEEZING). (Patient taking differently: Inhale 2 puffs into the lungs daily as needed (for flares).) 30.6 each 1   No facility-administered medications prior to visit.    ROS Review of Systems  Constitutional:  Positive for fatigue. Negative for appetite change, chills, diaphoresis and unexpected weight change.  HENT: Negative.  Negative for trouble swallowing.   Eyes: Negative.   Respiratory:  Positive for shortness of breath and wheezing. Negative for cough and chest tightness.   Cardiovascular:  Positive for chest pain. Negative for palpitations and leg swelling.  Gastrointestinal:  Positive for abdominal pain, nausea and vomiting.  Genitourinary: Negative.  Negative for difficulty urinating and dysuria.  Musculoskeletal:  Positive for arthralgias. Negative for myalgias.  Skin: Negative.   Neurological: Negative.  Negative for dizziness and weakness.  Hematological:  Negative for adenopathy. Does not bruise/bleed easily.  Psychiatric/Behavioral:  Positive for confusion, decreased concentration, dysphoric mood and sleep disturbance. Negative for agitation, behavioral problems and suicidal ideas. The patient is nervous/anxious.     Objective:  BP (!) 142/68 (BP Location: Left Arm, Patient Position: Sitting, Cuff Size: Normal)   Pulse 72   Temp 98.6 F (37 C) (Temporal)   Ht 5' 6 (1.676 m)   Wt 168 lb (76.2 kg)   SpO2 93%   BMI 27.12 kg/m   BP Readings from Last 3 Encounters:  06/03/24 (!) 142/68  05/21/24 (!) 140/78  05/12/24 (!) 166/74    Wt Readings from Last 3 Encounters:  06/03/24 168 lb (76.2 kg)  05/21/24 167 lb 6.4 oz (75.9 kg)  05/11/24 167 lb 6.4 oz (75.9 kg)    Physical Exam Vitals reviewed.  Constitutional:      Appearance: Normal appearance.  HENT:     Mouth/Throat:     Mouth: Mucous  membranes are moist.   Eyes:     General: No scleral icterus.    Conjunctiva/sclera: Conjunctivae normal.    Cardiovascular:     Rate and Rhythm: Normal rate and regular rhythm.     Pulses: Normal pulses.     Heart sounds: No murmur heard.    No friction rub. No gallop.  Pulmonary:     Effort: Pulmonary effort is normal.     Breath sounds: No stridor. No wheezing, rhonchi or rales.  Abdominal:     General: Abdomen is flat.     Palpations: There is no mass.     Tenderness: There is no abdominal tenderness. There is no guarding.     Hernia: No hernia is present.   Musculoskeletal:        General: Normal range of motion.     Cervical back: Neck supple.     Right lower leg: No edema.     Left lower leg: No edema.  Lymphadenopathy:     Cervical:  No cervical adenopathy.   Skin:    General: Skin is warm and dry.   Neurological:     General: No focal deficit present.     Mental Status: She is alert. Mental status is at baseline.   Psychiatric:        Mood and Affect: Mood normal.        Behavior: Behavior normal.     Lab Results  Component Value Date   WBC 5.9 05/11/2024   HGB 13.5 05/11/2024   HCT 43.2 05/11/2024   PLT 218 05/11/2024   GLUCOSE 89 06/03/2024   CHOL 194 08/07/2023   TRIG 125.0 08/07/2023   HDL 51.40 08/07/2023   LDLDIRECT 175.0 06/06/2022   LDLCALC 117 (H) 08/07/2023   ALT 40 03/13/2024   AST 42 (H) 03/13/2024   NA 140 06/03/2024   K 4.9 06/03/2024   CL 104 06/03/2024   CREATININE 1.36 (H) 06/03/2024   BUN 20 06/03/2024   CO2 29 06/03/2024   TSH 0.21 (L) 06/03/2024   INR 1.0 03/14/2024   HGBA1C 5.9 02/24/2024   MICROALBUR 44.7 (H) 02/24/2024    DG Chest 2 View Result Date: 05/11/2024 CLINICAL DATA:  Fall, left rib and back pain, shoulder pain EXAM: CHEST - 2 VIEW COMPARISON:  03/13/2024 FINDINGS: There are acute anterolateral left third through sixth rib fractures, better demonstrated on the comparison shoulder x-ray from today. Left  basilar atelectasis versus mild airspace disease noted with blunting of left costophrenic angle compatible with a small left effusion. No pneumothorax appreciated. No chest wall subcutaneous emphysema. Right lung remains clear. Mild cardiomegaly without CHF. Aorta is sclerotic. Trachea midline. Degenerative changes of the spine and shoulders. IMPRESSION: 1. Acute left third through sixth rib fractures. 2. Small left pleural effusion with left basilar atelectasis versus airspace disease. 3. No pneumothorax. Electronically Signed   By: Melven Stable.  Shick M.D.   On: 05/11/2024 15:09   DG Shoulder Left Result Date: 05/11/2024 CLINICAL DATA:  Recent fall, left shoulder pain EXAM: LEFT SHOULDER - 2+ VIEW COMPARISON:  03/13/2024 FINDINGS: The bones are osteopenic. Similar degenerative arthropathy of the left AC joint and glenohumeral joint with joint space loss, sclerosis and bony spurring. No acute osseous finding, fracture, or joint malalignment. No subluxation or dislocation. AC joint also non without separation. Included left chest demonstrates no acute displaced rib fractures of the left lateral third through sixth ribs. IMPRESSION: 1. Osteopenia and degenerative changes as above. 2. No acute finding involving the left shoulder by plain radiography. 3. Acute displaced left lateral third through sixth rib fractures. Electronically Signed   By: Melven Stable.  Shick M.D.   On: 05/11/2024 15:07    No results found.    Assessment & Plan:  Closed fracture of multiple ribs of left side with routine healing, subsequent encounter -     DG Chest 2 View; Future  CKD stage 3b, GFR 30-44 ml/min (HCC) -     Urinalysis, Routine w reflex microscopic; Future -     Basic metabolic panel with GFR; Future  Mild intermittent asthma without complication -     Budesonide -Formoterol  Fumarate; Inhale 2 puffs into the lungs 2 (two) times daily. INHALE 2 PUFFS INTO THE LUNGS DAILY AS NEEDED (SHORTNESS OF BREATH/WHEEZING).  Dispense: 30.6 each;  Refill: 1  Acquired hypothyroidism- Will lower her T4 dose. -     TSH; Future -     Levothyroxine  Sodium; Take 1 tablet (50 mcg total) by mouth daily.  Dispense: 90 tablet; Refill: 0  Periumbilical abdominal pain- Exam and labs are reassuring. -     Urinalysis, Routine w reflex microscopic; Future -     Lipase; Future -     Amylase; Future  GAD (generalized anxiety disorder) -     clonazePAM ; Take 1 tablet (0.5 mg total) by mouth 2 (two) times daily as needed for anxiety.  Dispense: 180 tablet; Refill: 0     Follow-up: Return if symptoms worsen or fail to improve.  Sandra Crouch, MD

## 2024-06-04 DIAGNOSIS — N1832 Chronic kidney disease, stage 3b: Secondary | ICD-10-CM | POA: Diagnosis not present

## 2024-06-04 DIAGNOSIS — M1009 Idiopathic gout, multiple sites: Secondary | ICD-10-CM | POA: Diagnosis not present

## 2024-06-04 DIAGNOSIS — J9811 Atelectasis: Secondary | ICD-10-CM | POA: Diagnosis not present

## 2024-06-04 DIAGNOSIS — M069 Rheumatoid arthritis, unspecified: Secondary | ICD-10-CM | POA: Diagnosis not present

## 2024-06-04 DIAGNOSIS — I129 Hypertensive chronic kidney disease with stage 1 through stage 4 chronic kidney disease, or unspecified chronic kidney disease: Secondary | ICD-10-CM | POA: Diagnosis not present

## 2024-06-04 DIAGNOSIS — M19012 Primary osteoarthritis, left shoulder: Secondary | ICD-10-CM | POA: Diagnosis not present

## 2024-06-04 DIAGNOSIS — S2242XD Multiple fractures of ribs, left side, subsequent encounter for fracture with routine healing: Secondary | ICD-10-CM | POA: Diagnosis not present

## 2024-06-04 DIAGNOSIS — J90 Pleural effusion, not elsewhere classified: Secondary | ICD-10-CM | POA: Diagnosis not present

## 2024-06-04 DIAGNOSIS — E1122 Type 2 diabetes mellitus with diabetic chronic kidney disease: Secondary | ICD-10-CM | POA: Diagnosis not present

## 2024-06-04 LAB — TSH: TSH: 0.21 u[IU]/mL — ABNORMAL LOW (ref 0.35–5.50)

## 2024-06-05 MED ORDER — LEVOTHYROXINE SODIUM 50 MCG PO TABS: 50.0000 ug | ORAL_TABLET | Freq: Every day | ORAL | 0 refills | Status: DC

## 2024-06-09 ENCOUNTER — Telehealth: Payer: Self-pay

## 2024-06-09 NOTE — Telephone Encounter (Signed)
 Copied from CRM [id number]. Topic: Clinical - Request for Lab/Test Order >> Jun 08, 2024  4:57 PM Alexis Shelton wrote: Reason for CRM: Spoke with the patient who is wanting to know if orders were submitted for a portable oxygen tank, patient is stating the nurse is the one who advised her of this during her last visit. Reach out via mychart is okay. Ty

## 2024-06-10 ENCOUNTER — Other Ambulatory Visit: Payer: Self-pay | Admitting: Internal Medicine

## 2024-06-10 DIAGNOSIS — E1122 Type 2 diabetes mellitus with diabetic chronic kidney disease: Secondary | ICD-10-CM | POA: Diagnosis not present

## 2024-06-10 DIAGNOSIS — M1009 Idiopathic gout, multiple sites: Secondary | ICD-10-CM | POA: Diagnosis not present

## 2024-06-10 DIAGNOSIS — N1832 Chronic kidney disease, stage 3b: Secondary | ICD-10-CM | POA: Diagnosis not present

## 2024-06-10 DIAGNOSIS — S2242XD Multiple fractures of ribs, left side, subsequent encounter for fracture with routine healing: Secondary | ICD-10-CM | POA: Diagnosis not present

## 2024-06-10 DIAGNOSIS — M069 Rheumatoid arthritis, unspecified: Secondary | ICD-10-CM | POA: Diagnosis not present

## 2024-06-10 DIAGNOSIS — M19012 Primary osteoarthritis, left shoulder: Secondary | ICD-10-CM | POA: Diagnosis not present

## 2024-06-10 DIAGNOSIS — J4520 Mild intermittent asthma, uncomplicated: Secondary | ICD-10-CM

## 2024-06-10 DIAGNOSIS — J9811 Atelectasis: Secondary | ICD-10-CM | POA: Diagnosis not present

## 2024-06-10 DIAGNOSIS — I129 Hypertensive chronic kidney disease with stage 1 through stage 4 chronic kidney disease, or unspecified chronic kidney disease: Secondary | ICD-10-CM | POA: Diagnosis not present

## 2024-06-10 DIAGNOSIS — J90 Pleural effusion, not elsewhere classified: Secondary | ICD-10-CM | POA: Diagnosis not present

## 2024-06-10 NOTE — Telephone Encounter (Signed)
 I do not see any orders placed for this, please advise

## 2024-06-16 ENCOUNTER — Other Ambulatory Visit: Payer: Self-pay | Admitting: Internal Medicine

## 2024-06-16 DIAGNOSIS — M5030 Other cervical disc degeneration, unspecified cervical region: Secondary | ICD-10-CM

## 2024-06-16 DIAGNOSIS — M51369 Other intervertebral disc degeneration, lumbar region without mention of lumbar back pain or lower extremity pain: Secondary | ICD-10-CM

## 2024-06-16 DIAGNOSIS — M19042 Primary osteoarthritis, left hand: Secondary | ICD-10-CM

## 2024-06-19 ENCOUNTER — Ambulatory Visit: Payer: Self-pay

## 2024-06-19 DIAGNOSIS — S2242XD Multiple fractures of ribs, left side, subsequent encounter for fracture with routine healing: Secondary | ICD-10-CM | POA: Diagnosis not present

## 2024-06-19 DIAGNOSIS — N1832 Chronic kidney disease, stage 3b: Secondary | ICD-10-CM | POA: Diagnosis not present

## 2024-06-19 DIAGNOSIS — J90 Pleural effusion, not elsewhere classified: Secondary | ICD-10-CM | POA: Diagnosis not present

## 2024-06-19 DIAGNOSIS — E1122 Type 2 diabetes mellitus with diabetic chronic kidney disease: Secondary | ICD-10-CM | POA: Diagnosis not present

## 2024-06-19 DIAGNOSIS — M069 Rheumatoid arthritis, unspecified: Secondary | ICD-10-CM | POA: Diagnosis not present

## 2024-06-19 DIAGNOSIS — M19012 Primary osteoarthritis, left shoulder: Secondary | ICD-10-CM | POA: Diagnosis not present

## 2024-06-19 DIAGNOSIS — M1009 Idiopathic gout, multiple sites: Secondary | ICD-10-CM | POA: Diagnosis not present

## 2024-06-19 DIAGNOSIS — J9811 Atelectasis: Secondary | ICD-10-CM | POA: Diagnosis not present

## 2024-06-19 DIAGNOSIS — I129 Hypertensive chronic kidney disease with stage 1 through stage 4 chronic kidney disease, or unspecified chronic kidney disease: Secondary | ICD-10-CM | POA: Diagnosis not present

## 2024-06-19 NOTE — Telephone Encounter (Signed)
 FYI Only or Action Required?: FYI only for provider.  Patient was last seen in primary care on 06/03/2024 by Joshua Debby CROME, MD. Called Nurse Triage reporting No chief complaint on file.. Symptoms began several days ago. Interventions attempted: Nothing. Symptoms are: stable.  Triage Disposition: See Physician Within 24 Hours Scheduled an appt at local UC   Patient/caregiver understands and will follow disposition?: Yes     Copied from CRM [id number]. Topic: Clinical - Red Word Triage >> Jun 19, 2024  4:17 PM Melissa C wrote: Red Word that prompted transfer to Nurse Triage: patient thinks she has an active kidney infection and has stage 3 kidney disease. Thought she was scheduled for appointment tomorrow but appointments show 7/28 appointment Reason for Disposition  Side (flank) or lower back pain present  Answer Assessment - Initial Assessment Questions 1. SYMPTOM: What's the main symptom you're concerned about? (e.g., frequency, incontinence)      ------------Dysuria  and itching   2. ONSET: When did the  start?     -------------------- 2 weeks ago    3. PAIN: Is there any pain? If Yes, ask: How bad is it? (Scale: 1-10; mild, moderate, severe)     -------------------------------- 7/10    4. CAUSE: What do you think is causing the symptoms?     -------------------------- Think she has a UTI   5. OTHER SYMPTOMS: Do you have any other symptoms? (e.g., blood in urine, fever, flank pain, pain with urination)     --------------------Flank Pain ( unsure if same as baseline or worse) ----------------------- Fever ( Now dissipated)  Protocols used: Urinary Symptoms-A-AH

## 2024-06-20 ENCOUNTER — Ambulatory Visit (HOSPITAL_COMMUNITY): Payer: Self-pay

## 2024-06-25 ENCOUNTER — Other Ambulatory Visit: Payer: Self-pay | Admitting: Internal Medicine

## 2024-06-25 DIAGNOSIS — I10 Essential (primary) hypertension: Secondary | ICD-10-CM

## 2024-06-25 DIAGNOSIS — J4520 Mild intermittent asthma, uncomplicated: Secondary | ICD-10-CM

## 2024-06-25 NOTE — Telephone Encounter (Signed)
 Copied from CRM [id number]. Topic: Clinical - Medication Refill >> Jun 25, 2024  4:03 PM Thersia C wrote: Medication: terazosin  (HYTRIN ) 2 MG capsule metoprolol  succinate (TOPROL -XL) 25 MG 24 hr tablet budesonide -formoterol  (SYMBICORT ) 160-4.5 MCG/ACT inhaler   Has the patient contacted their pharmacy? Yes (Agent: If no, request that the patient contact the pharmacy for the refill. If patient does not wish to contact the pharmacy document the reason why and proceed with request.) (Agent: If yes, when and what did the pharmacy advise?)  This is the patient's preferred pharmacy:    [facility] - [place], [place] - [address] [place] [address] Phone: [phone] Fax: [phone]  Is this the correct pharmacy for this prescription? Yes If no, delete pharmacy and type the correct one.   Has the prescription been filled recently? No  Is the patient out of the medication? Yes  Has the patient been seen for an appointment in the last year OR does the patient have an upcoming appointment? Yes  Can we respond through MyChart? Yes  Agent: Please be advised that Rx refills may take up to 3 business days. We ask that you follow-up with your pharmacy.

## 2024-06-30 ENCOUNTER — Other Ambulatory Visit: Payer: Self-pay | Admitting: Internal Medicine

## 2024-06-30 ENCOUNTER — Other Ambulatory Visit: Payer: Self-pay | Admitting: Rheumatology

## 2024-06-30 DIAGNOSIS — M1A09X0 Idiopathic chronic gout, multiple sites, without tophus (tophi): Secondary | ICD-10-CM

## 2024-06-30 DIAGNOSIS — E039 Hypothyroidism, unspecified: Secondary | ICD-10-CM

## 2024-07-01 ENCOUNTER — Other Ambulatory Visit: Payer: Self-pay | Admitting: Internal Medicine

## 2024-07-02 ENCOUNTER — Telehealth: Payer: Self-pay

## 2024-07-02 NOTE — Telephone Encounter (Signed)
 Patient would like to know if she could be re-prescribed Ozempic  patient states she confirmed this medication was not making her sick. Patient identified that Rosuvastatin  was making her sick as she was taken it not as it was prescribed.  CB#[phone].

## 2024-07-02 NOTE — Telephone Encounter (Signed)
 Pharmacy Patient Advocate Encounter   Received notification from Fax that prior authorization for OZEMPIC  is required/requested.   Insurance verification completed.   The patient is insured through [facility] .   Medication discontinued 06/03/24 by provider

## 2024-07-03 ENCOUNTER — Ambulatory Visit: Payer: Medicare HMO

## 2024-07-03 NOTE — Telephone Encounter (Signed)
**Note De-identified  [place] Obfuscation** Please advise 

## 2024-07-06 NOTE — Telephone Encounter (Unsigned)
 Copied from CRM [id number]. Topic: Clinical - Medication Question >> Jul 06, 2024  5:05 PM Sophia H wrote: Reason for CRM: Patient was checking in on ozempic  again and wanting to know if Dr. Joshua approved her restarting it, please advise. # [id number]   [id number] [place], [place] - [address] AT [address] OF [facility] [address]

## 2024-07-07 NOTE — Telephone Encounter (Signed)
 No, she did not tolerate this

## 2024-07-08 NOTE — Telephone Encounter (Unsigned)
 Copied from CRM [id number]. Topic: Clinical - Medication Question >> Jul 08, 2024  1:41 PM Aleatha C wrote: Reason for CRM: Patient wants to know if Dr joshua will re consider the ozempic  because she found out it was the rosuvastatin  (CRESTOR ) 10 MG tablet that was actually making her sick

## 2024-07-09 ENCOUNTER — Other Ambulatory Visit: Payer: Self-pay | Admitting: Family Medicine

## 2024-07-09 ENCOUNTER — Other Ambulatory Visit: Payer: Self-pay | Admitting: Internal Medicine

## 2024-07-09 ENCOUNTER — Ambulatory Visit

## 2024-07-09 VITALS — Ht 66.0 in | Wt 168.0 lb

## 2024-07-09 DIAGNOSIS — M1A09X0 Idiopathic chronic gout, multiple sites, without tophus (tophi): Secondary | ICD-10-CM

## 2024-07-09 DIAGNOSIS — Z1231 Encounter for screening mammogram for malignant neoplasm of breast: Secondary | ICD-10-CM

## 2024-07-09 DIAGNOSIS — Z0000 Encounter for general adult medical examination without abnormal findings: Secondary | ICD-10-CM | POA: Diagnosis not present

## 2024-07-09 DIAGNOSIS — Z780 Asymptomatic menopausal state: Secondary | ICD-10-CM

## 2024-07-09 NOTE — Patient Instructions (Signed)
 Alexis Shelton , Thank you for taking time out of your busy schedule to complete your Annual Wellness Visit with me. I enjoyed our conversation and look forward to speaking with you again next year. I, as well as your care team,  appreciate your ongoing commitment to your health goals. Please review the following plan we discussed and let me know if I can assist you in the future. Your Game plan/ To Do List    Referrals: If you haven't heard from the office you've been referred to, please reach out to them at the phone provided.  You have an order for:  [x]   Bone Density     Please call for appointment:  [facility] [address] [place], [place] [address] [phone]  Make sure to wear two-piece clothing.  No lotions, powders, or deodorants the day of the appointment. Make sure to bring picture ID and insurance card.  Bring list of medications you are currently taking including any supplements.   Follow up Visits: Next Medicare AWV with our clinical staff: 07/13/2025.   Have you seen your provider in the last 6 months (3 months if uncontrolled diabetes)? Yes Next Office Visit with your provider: 08/10/2024.  Clinician Recommendations:  Aim for 30 minutes of exercise or brisk walking, 6-8 glasses of water, and 5 servings of fruits and vegetables each day. You are due for a 2nd shingles vaccine and can get that done at any local pharmacy.        This is a list of the screening recommended for you and due dates:  Health Maintenance  Topic Date Due   Medicare Annual Wellness Visit  06/18/2024   Zoster (Shingles) Vaccine (2 of 2) 07/15/2024*   DEXA scan (bone density measurement)  04/15/2025*   Flu Shot  07/24/2024   DTaP/Tdap/Td vaccine (2 - Tdap) 10/10/2031   Pneumococcal Vaccine for age over 61  Completed   Hepatitis B Vaccine  Aged Out   HPV Vaccine  Aged Out   Meningitis B Vaccine  Aged Out   COVID-19 Vaccine  Discontinued  *Topic was postponed. The date  shown is not the original due date.    Advanced directives: (Copy Requested) Please bring a copy of your health care power of attorney and living will to the office to be added to your chart at your convenience. You can mail to [facility] [address] [place], [place] [address] or email to [email or web address] Advance Care Planning is important because it:  [x]  Makes sure you receive the medical care that is consistent with your values, goals, and preferences  [x]  It provides guidance to your family and loved ones and reduces their decisional burden about whether or not they are making the right decisions based on your wishes.  Follow the link provided in your after visit summary or read over the paperwork we have mailed to you to help you started getting your Advance Directives in place. If you need assistance in completing these, please reach out to us  so that we can help you!  See attachments for Preventive Care and Fall Prevention Tips.

## 2024-07-09 NOTE — Telephone Encounter (Signed)
 Patient Has been made aware of Dr. Joshua comments and she gave a verbal understanding.

## 2024-07-09 NOTE — Progress Notes (Addendum)
 Subjective:   Alexis Shelton is a 83 y.o. who presents for a Medicare Wellness preventive visit.  As a reminder, Annual Wellness Visits don't include a physical exam, and some assessments may be limited, especially if this visit is performed virtually. We may recommend an in-person follow-up visit with your provider if needed.  Visit Complete: Virtual I connected with  Alexis Shelton on 07/09/24 by a audio enabled telemedicine application and verified that I am speaking with the correct person using two identifiers.  Patient Location: Home  Provider Location: Office/Clinic  I discussed the limitations of evaluation and management by telemedicine. The patient expressed understanding and agreed to proceed.  Vital Signs: Because this visit was a virtual/telehealth visit, some criteria may be missing or patient reported. Any vitals not documented were not able to be obtained and vitals that have been documented are patient reported.  VideoDeclined- This patient declined Librarian, academic. Therefore the visit was completed with audio only.  Persons Participating in Visit: Patient.  AWV Questionnaire: Yes: Patient Medicare AWV questionnaire was completed by the patient on 07/08/2024; I have confirmed that all information answered by patient is correct and no changes since this date.        Objective:    Today's Vitals   07/09/24 1417  Weight: 168 lb (76.2 kg)  Height: 5' 6 (1.676 m)   Body mass index is 27.12 kg/m.     05/11/2024   12:43 PM 03/13/2024    2:21 PM 06/19/2023    2:50 PM 12/20/2022    7:25 PM 12/20/2022    7:03 PM 06/18/2022    3:37 PM 06/18/2022    3:33 PM  Advanced Directives  Does Patient Have a Medical Advance Directive? No No Yes No Yes No --  Type of Best boy of [place];Living will  Healthcare Power of Attorney    Copy of Healthcare Power of Attorney in Chart?   No - copy requested  No -  copy available, Physician notified    Would patient like information on creating a medical advance directive? No - Patient declined No - Patient declined         Current Medications (verified) Outpatient Encounter Medications as of 07/09/2024  Medication Sig   acetaminophen  (TYLENOL ) 500 MG tablet Take 2 tablets (1,000 mg total) by mouth 3 (three) times daily.   albuterol  (VENTOLIN  HFA) 108 (90 Base) MCG/ACT inhaler Inhale 2 puffs into the lungs every 6 (six) hours as needed for wheezing or shortness of breath.   allopurinol  (ZYLOPRIM ) 100 MG tablet TAKE 1 TABLET BY MOUTH TWICE A DAY   budesonide -formoterol  (SYMBICORT ) 160-4.5 MCG/ACT inhaler Inhale 2 puffs into the lungs 2 (two) times daily. INHALE 2 PUFFS INTO THE LUNGS DAILY AS NEEDED (SHORTNESS OF BREATH/WHEEZING).   clonazePAM  (KLONOPIN ) 0.5 MG tablet Take 1 tablet (0.5 mg total) by mouth 2 (two) times daily as needed for anxiety.   levothyroxine  (SYNTHROID ) 50 MCG tablet Take 1 tablet (50 mcg total) by mouth daily.   metoprolol  succinate (TOPROL -XL) 25 MG 24 hr tablet TAKE 1 TABLET (25 MG TOTAL) BY MOUTH DAILY.   montelukast  (SINGULAIR ) 10 MG tablet TAKE 1 TABLET BY MOUTH EVERYDAY AT BEDTIME   Multiple Vitamin (TAB-A-VITE) TABS TAKE 1 TABLET EVERY DAY   rosuvastatin  (CRESTOR ) 10 MG tablet TAKE 1 TABLET EVERY DAY   terazosin  (HYTRIN ) 2 MG capsule TAKE 1 CAPSULE BY MOUTH AT BEDTIME.   tiZANidine  (ZANAFLEX ) 4 MG tablet TAKE  1 TABLET BY MOUTH AT BEDTIME AS NEEDED FOR MUSCLE SPASMS.   ASPIRIN  LOW DOSE 81 MG tablet TAKE 1 TABLET EVERY DAY (SWALLOW WHOLE) (Patient taking differently: Take 81 mg by mouth in the morning.)   eszopiclone  (LUNESTA ) 2 MG TABS tablet Take 1 tablet (2 mg total) by mouth at bedtime as needed for sleep. Take immediately before bedtime (Patient taking differently: Take 2 mg by mouth at bedtime as needed for sleep (immediately before bedtime).)   Insulin  Pen Needle 32G X 6 MM MISC 1 Act by Does not apply route once a week.    lidocaine  4 % Place 1 patch onto the skin daily as needed (pain).   ondansetron  (ZOFRAN -ODT) 4 MG disintegrating tablet Dissolve 1 tablet under the tongue every 8 (eight) hours as needed for nausea or vomiting. (Patient taking differently: Take 4 mg by mouth every 8 (eight) hours as needed for nausea or vomiting (dissolve orally).)   No facility-administered encounter medications on file as of 07/09/2024.    Allergies (verified) Patient has no known allergies.   History: Past Medical History:  Diagnosis Date   Arthritis    DDD (degenerative disc disease), lumbar 11/24/2018   Dizziness    Dyspnea    Elevated liver function tests    Gallstones    Hearing loss    History of stroke involving cerebellum 10/13/2018   Hypercholesteremia    Hypertension    Hypothyroidism    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   Migraines    Mild intermittent asthma without complication 02/11/2019   Primary osteoarthritis of both feet 11/24/2018   Proteinuria    Rheumatoid arthritis (HCC) 07/04/2018   Stage 3 chronic kidney disease (HCC) 02/11/2019   Vitamin D  deficiency 02/11/2019   Past Surgical History:  Procedure Laterality Date   ABDOMINAL HYSTERECTOMY     BREAST REDUCTION SURGERY Bilateral    CATARACT EXTRACTION, BILATERAL     CHOLECYSTECTOMY N/A 12/11/2021   Procedure: LAPAROSCOPIC CHOLECYSTECTOMY;  Surgeon: Aron Shoulders, MD;  Location: [facility];  Service: General;  Laterality: N/A;   ENDOSCOPIC RETROGRADE CHOLANGIOPANCREATOGRAPHY (ERCP) WITH PROPOFOL  N/A 12/09/2021   Procedure: ENDOSCOPIC RETROGRADE CHOLANGIOPANCREATOGRAPHY (ERCP) WITH PROPOFOL ;  Surgeon: Teressa Toribio SQUIBB, MD;  Location: [facility];  Service: Endoscopy;  Laterality: N/A;   INCISION AND DRAINAGE BREAST ABSCESS Left    REDUCTION MAMMAPLASTY     REMOVAL OF STONES  12/09/2021   Procedure: REMOVAL OF STONES;  Surgeon: Teressa Toribio SQUIBB, MD;  Location: [facility];  Service: Endoscopy;;   REPLACEMENT TOTAL KNEE  Bilateral    SPHINCTEROTOMY  12/09/2021   Procedure: SPHINCTEROTOMY;  Surgeon: Teressa Toribio SQUIBB, MD;  Location: [facility];  Service: Endoscopy;;   TONSILLECTOMY AND ADENOIDECTOMY     TOOTH EXTRACTION  2023   x2   Family History  Problem Relation Age of Onset   Stroke Mother    Hyperlipidemia Mother    Arthritis Mother    Heart disease Father    Arthritis Sister    Heart disease Sister    Gallbladder disease Maternal Grandmother        radiation   GER disease Maternal Grandmother    Bipolar disorder Son    Healthy Son    Heart disease Paternal Uncle        x 7   Colon cancer Neg Hx    Esophageal cancer Neg Hx    Rectal cancer Neg Hx    Stomach cancer Neg Hx    Cancer Neg Hx  Social History   Socioeconomic History   Marital status: Divorced    Spouse name: Not on file   Number of children: 1   Years of education: MD   Highest education level: Professional school degree (e.g., MD, DDS, DVM, JD)  Occupational History   Occupation: Retired - Sales promotion account executive    Comment: [employer]  Tobacco Use   Smoking status: Former    Current packs/day: 0.00    Average packs/day: 0.1 packs/day for 3.0 years (0.3 ttl pk-yrs)    Types: Cigarettes    Start date: 66    Quit date: 1970    Years since quitting: 55.5    Passive exposure: Never   Smokeless tobacco: Never  Vaping Use   Vaping status: Never Used  Substance and Sexual Activity   Alcohol use: Not Currently   Drug use: Never   Sexual activity: Not Currently    Comment: more than, after 14  Other Topics Concern   Not on file  Social History Narrative   Lives alone.  [facility]/The patient is widowed./ 2025   She worked at the [employer] in [place], MD scientist, and then moved to [place]  and moved to [place] where her son is a International aid/development worker, after her husband died   Right-handed.   1 cup coffee per day.   No alcohol   1 son as above   Former smoker   Social Drivers of Corporate investment banker  Strain: Medium Risk (07/08/2024)   Overall Financial Resource Strain (CARDIA)    Difficulty of Paying Living Expenses: Somewhat hard  Food Insecurity: Food Insecurity Present (07/08/2024)   Hunger Vital Sign    Worried About Running Out of Food in the Last Year: Sometimes true    Ran Out of Food in the Last Year: Sometimes true  Transportation Needs: No Transportation Needs (07/08/2024)   PRAPARE - Administrator, Civil Service (Medical): No    Lack of Transportation (Non-Medical): No  Physical Activity: Inactive (07/08/2024)   Exercise Vital Sign    Days of Exercise per Week: 0 days    Minutes of Exercise per Session: Not on file  Stress: Stress Concern Present (07/08/2024)   [employer] of Occupational Health - Occupational Stress Questionnaire    Feeling of Stress: To some extent  Social Connections: Socially Isolated (07/08/2024)   Social Connection and Isolation Panel    Frequency of Communication with Friends and Family: More than three times a week    Frequency of Social Gatherings with Friends and Family: Once a week    Attends Religious Services: Never    Database administrator or Organizations: No    Attends Engineer, structural: Not on file    Marital Status: Divorced    Tobacco Counseling Counseling given: Not Answered    Clinical Intake:  Pre-visit preparation completed: Yes  Pain : No/denies pain     BMI - recorded: 27.12 Nutritional Status: BMI 25 -29 Overweight Nutritional Risks: Nausea/ vomitting/ diarrhea Diabetes: No  Lab Results  Component Value Date   HGBA1C 5.9 02/24/2024   HGBA1C 6.0 08/07/2023   HGBA1C 6.2 10/02/2022     How often do you need to have someone help you when you read instructions, pamphlets, or other written materials from your doctor or pharmacy?: 1 - Never  Interpreter Needed?: No  Information entered by :: Charli Halle, RMA   Activities of Daily Living     07/08/2024    2:15 AM  05/11/2024     5:00 PM  In your present state of health, do you have any difficulty performing the following activities:  Hearing? 1 1  Vision? 1 0  Difficulty concentrating or making decisions? 0 1  Walking or climbing stairs? 0   Dressing or bathing? 0   Doing errands, shopping? 0 0  Preparing Food and eating ? N   Using the Toilet? N   In the past six months, have you accidently leaked urine? Y   Do you have problems with loss of bowel control? Y   Managing your Medications? N   Managing your Finances? N   Housekeeping or managing your Housekeeping? N     Patient Care Team: Joshua Debby CROME, MD as PCP - General (Internal Medicine) Avram Lupita BRAVO, MD as Consulting Physician (Gastroenterology) Cathlyn JAYSON Cary, Bobie BRAVO, MD as Consulting Physician (Obstetrics and Gynecology)  I have updated your Care Teams any recent Medical Services you may have received from other providers in the past year.     Assessment:   This is a routine wellness examination for Alexis Shelton.  Hearing/Vision screen Hearing Screening - Comments:: Has some hearing difficulties   Vision Screening - Comments:: Denies vision issues./ No eye doctor    Goals Addressed   None    Depression Screen     07/09/2024    2:24 PM 07/09/2024    2:23 PM 03/03/2024   10:16 AM 07/02/2023    3:09 PM 06/19/2023    2:58 PM 06/18/2022    3:34 PM 08/09/2021    4:45 PM  PHQ 2/9 Scores  PHQ - 2 Score 4 3 0 0 0 2 2  PHQ- 9 Score 4   0 3  9    Fall Risk     07/08/2024    2:15 AM 03/03/2024   10:22 AM 02/24/2024    3:34 PM 07/02/2023    3:09 PM 06/19/2023    2:51 PM  Fall Risk   Falls in the past year? 1 0 1 0 1  Number falls in past yr: 1 0 0 0 1  Injury with Fall? 1 0 0 0 0  Risk for fall due to : Impaired balance/gait No Fall Risks No Fall Risks No Fall Risks History of fall(s);Impaired balance/gait;Orthopedic patient  Follow up Falls evaluation completed;Falls prevention discussed Falls evaluation completed Falls evaluation completed Falls  evaluation completed     MEDICARE RISK AT HOME:  Medicare Risk at Home Any stairs in or around the home?: (Patient-Rptd) No If so, are there any without handrails?: (Patient-Rptd) No Home free of loose throw rugs in walkways, pet beds, electrical cords, etc?: (Patient-Rptd) Yes Adequate lighting in your home to reduce risk of falls?: (Patient-Rptd) Yes Life alert?: (Patient-Rptd) Yes Use of a cane, walker or w/c?: (Patient-Rptd) Yes Grab bars in the bathroom?: (Patient-Rptd) Yes Shower chair or bench in shower?: (Patient-Rptd) Yes Elevated toilet seat or a handicapped toilet?: (Patient-Rptd) No  TIMED UP AND GO:  Was the test performed?  No  Cognitive Function: Declined/Normal: No cognitive concerns noted by patient or family. Patient alert, oriented, able to answer questions appropriately and recall recent events. No signs of memory loss or confusion.        06/19/2023    2:51 PM 06/18/2022    3:36 PM  6CIT Screen  What Year? 0 points 0 points  What month? 0 points 0 points  What time? 0 points 0 points  Count back from 20 0 points  0 points  Months in reverse 0 points 0 points  Repeat phrase 0 points 2 points  Total Score 0 points 2 points    Immunizations Immunization History  Administered Date(s) Administered   Fluad Quad(high Dose 65+) 10/09/2021, 10/02/2022   Influenza, High Dose Seasonal PF 09/20/2018, 09/11/2019, 12/08/2019, 09/10/2023   Influenza-Unspecified 11/20/2020   Moderna Sars-Covid-2 Vaccination 01/08/2020, 02/04/2020   PFIZER SARS-COV-2 Pediatric Vaccination 5-1yrs 09/10/2023   PFIZER(Purple Top)SARS-COV-2 Vaccination 11/20/2020   PNEUMOCOCCAL CONJUGATE-20 02/21/2022   Td 10/09/2021   Zoster Recombinant(Shingrix ) 06/20/2023    Screening Tests Health Maintenance  Topic Date Due   Medicare Annual Wellness (AWV)  06/18/2024   Zoster Vaccines- Shingrix  (2 of 2) 07/15/2024 (Originally 08/15/2023)   DEXA SCAN  04/15/2025 (Originally 08/25/2004)    INFLUENZA VACCINE  07/24/2024   DTaP/Tdap/Td (2 - Tdap) 10/10/2031   Pneumococcal Vaccine: 50+ Years  Completed   Hepatitis B Vaccines  Aged Out   HPV VACCINES  Aged Out   Meningococcal B Vaccine  Aged Out   COVID-19 Vaccine  Discontinued    Health Maintenance  Health Maintenance Due  Topic Date Due   Medicare Annual Wellness (AWV)  06/18/2024   Health Maintenance Items Addressed: DEXA ordered, See Nurse Notes at the end of this note  Additional Screening:  Vision Screening: Recommended annual ophthalmology exams for early detection of glaucoma and other disorders of the eye. Would you like a referral to an eye doctor? Yes    Dental Screening: Recommended annual dental exams for proper oral hygiene  Community Resource Referral / Chronic Care Management: CRR required this visit?  No   CCM required this visit?  No   Plan:    I have personally reviewed and noted the following in the patient's chart:   Medical and social history Use of alcohol, tobacco or illicit drugs  Current medications and supplements including opioid prescriptions. Patient is not currently taking opioid prescriptions. Functional ability and status Nutritional status Physical activity Advanced directives List of other physicians Hospitalizations, surgeries, and ER visits in previous 12 months Vitals Screenings to include cognitive, depression, and falls Referrals and appointments  In addition, I have reviewed and discussed with patient certain preventive protocols, quality metrics, and best practice recommendations. A written personalized care plan for preventive services as well as general preventive health recommendations were provided to patient.   Ramiro Pangilinan L Tonye Tancredi, CMA   07/09/2024   After Visit Summary: (MyChart) Due to this being a telephonic visit, the after visit summary with patients personalized plan was offered to patient via MyChart   Notes: Patient is due for a 2nd shingrix  vaccine.   She is due for a DEXA and order has been placed today.. Patient had no other concerns to address today.

## 2024-07-10 ENCOUNTER — Telehealth: Payer: Self-pay

## 2024-07-10 NOTE — Telephone Encounter (Signed)
 ERROR

## 2024-07-15 ENCOUNTER — Telehealth: Payer: Self-pay | Admitting: Internal Medicine

## 2024-07-15 NOTE — Telephone Encounter (Signed)
 Copied from CRM #[id number]. Topic: Clinical - Prescription Issue >> Jul 15, 2024  2:14 PM Rea C wrote: Reason for CRM: Patient called in and stated that [facility] had stopped accepting Modasinil, with no reason why, but she said she has a few pills left and needs it in regards to her sleep apnea. She said Dr. Joshua prescribes it and is wondering if he can write the script for her again?   [id number] [place], [place] - [address] [facility] [address] [place] [address] Phone: [phone] Fax: [phone] Hours: Not open 24 hours   Patient contact is [phone] (M)

## 2024-07-16 NOTE — Telephone Encounter (Signed)
 This medication is not on her medication list. Please advise.

## 2024-07-17 ENCOUNTER — Ambulatory Visit: Payer: Self-pay | Admitting: *Deleted

## 2024-07-17 NOTE — Telephone Encounter (Signed)
 Copied from CRM #[id number]. Topic: Clinical - Red Word Triage >> Jul 17, 2024 11:45 AM [name] D wrote: Red Word that prompted transfer to Nurse Triage: Pain  Pt stated that she is experiencing pain in her hands, arms, shoulders, and legs. Pt would like to send a message to Dr.Jones to see if there is any medication he could prescribed or if she needs to schedule an appt. Reason for Disposition  Body pains are a chronic symptom (recurrent or ongoing AND present > 4 weeks)  Answer Assessment - Initial Assessment Questions 1. ONSET: When did the muscle aches or body pains start?      Chronic pain 2. LOCATION: What part of your body is hurting? (e.g., entire body, arms, legs)      Hands, arms, shoulder, legs- joints 3. SEVERITY: How bad is the pain? (Scale 1-10; or mild, moderate, severe)     Wakes patient at night 4. CAUSE: What do you think is causing the pains?     arthritis 5. FEVER: Do you have a fever? If Yes, ask: What is your temperature, how was it measured, and  when did it start?      no 6. OTHER SYMPTOMS: Do you have any other symptoms? (e.g., chest pain, cold or flu symptoms, rash, weakness, weight loss)     Hx gout- does see rheumatologist- not getting what she needs  Patient states her arthritis symptoms seem to be worse- waking her at night. Patient would like to know if PCP has any suggestions/medications that may be helpful- wants to avoid narcotics. Patient has appointment scheduled and feels she can wait- just wants advice.  Protocols used: Muscle Aches and Body Pain-A-AH

## 2024-07-17 NOTE — Telephone Encounter (Signed)
 FYI Only or Action Required?: Action required by provider: clinical question for provider.  Patient was last seen in primary care on 06/03/2024 by Joshua Debby CROME, MD.  Called Nurse Triage reporting body pain.  Symptoms began chronic.  Interventions attempted: OTC medications: tylenol , activity.  Symptoms are: gradually worsening.  Triage Disposition: See PCP Within 2 Weeks  Patient/caregiver understands and will follow disposition?: has future appointment- does not want to reschedule- advice requested

## 2024-07-17 NOTE — Telephone Encounter (Signed)
**Note De-identified  [place] Obfuscation** Please advise 

## 2024-07-20 ENCOUNTER — Ambulatory Visit: Admitting: Obstetrics and Gynecology

## 2024-07-21 ENCOUNTER — Other Ambulatory Visit: Payer: Self-pay | Admitting: Internal Medicine

## 2024-07-21 ENCOUNTER — Telehealth: Payer: Self-pay | Admitting: Internal Medicine

## 2024-07-21 DIAGNOSIS — I10 Essential (primary) hypertension: Secondary | ICD-10-CM

## 2024-07-21 DIAGNOSIS — J4520 Mild intermittent asthma, uncomplicated: Secondary | ICD-10-CM

## 2024-07-21 NOTE — Telephone Encounter (Unsigned)
 Copied from CRM [id number]. Topic: Clinical - Medication Question >> Jul 21, 2024  3:25 PM Thersia BROCKS wrote: Reason for CRM: Patient called in regarding a medication, stated she doesn't remember the name but it help her from going to sleep during day . Would like it to be refill to  [facility] - [place], [place] - [address] [place] [address] Phone: [phone] Fax: [phone]

## 2024-07-21 NOTE — Telephone Encounter (Signed)
 Received a letter that stated they need more information to refill request

## 2024-07-21 NOTE — Telephone Encounter (Unsigned)
 Copied from CRM [id number]. Topic: Clinical - Medication Refill >> Jul 21, 2024  3:20 PM Thersia C wrote: Medication:  metoprolol  succinate (TOPROL -XL) 25 MG 24 hr tablet terazosin  (HYTRIN ) 2 MG capsule budesonide -formoterol  (SYMBICORT ) 160-4.5 MCG/ACT inhaler Received a letter that stated they need more information to refill request   Has the patient contacted their pharmacy? Yes (Agent: If no, request that the patient contact the pharmacy for the refill. If patient does not wish to contact the pharmacy document the reason why and proceed with request.) (Agent: If yes, when and what did the pharmacy advise?)  This is the patient's preferred pharmacy:   [facility] - [place], [place] - [address] [place] [address] Phone: [phone] Fax: [phone]  Is this the correct pharmacy for this prescription? Yes If no, delete pharmacy and type the correct one.   Has the prescription been filled recently? No  Is the patient out of the medication? Yes  Has the patient been seen for an appointment in the last year OR does the patient have an upcoming appointment? Yes  Can we respond through MyChart? Yes  Agent: Please be advised that Rx refills may take up to 3 business days. We ask that you follow-up with your pharmacy.

## 2024-07-21 NOTE — Telephone Encounter (Unsigned)
 Copied from CRM [id number]. Topic: Clinical - Medication Refill >> Jul 21, 2024  3:33 PM Macario HERO wrote: Medication: Modafinil   Has the patient contacted their pharmacy? No (Agent: If no, request that the patient contact the pharmacy for the refill. If patient does not wish to contact the pharmacy document the reason why and proceed with request.) (Agent: If yes, when and what did the pharmacy advise?)  This is the patient's preferred pharmacy:    [facility] - [place], [place] - [address] [place] [address] Phone: [phone] Fax: [phone]  Is this the correct pharmacy for this prescription? Yes If no, delete pharmacy and type the correct one.   Has the prescription been filled recently? No  Is the patient out of the medication? Yes  Has the patient been seen for an appointment in the last year OR does the patient have an upcoming appointment? Yes  Can we respond through MyChart? Yes  Agent: Please be advised that Rx refills may take up to 3 business days. We ask that you follow-up with your pharmacy.

## 2024-07-22 NOTE — Telephone Encounter (Signed)
 Unable to reach patient. Unable to [facility]

## 2024-07-23 ENCOUNTER — Encounter: Payer: Self-pay | Admitting: Obstetrics and Gynecology

## 2024-07-23 ENCOUNTER — Ambulatory Visit (INDEPENDENT_AMBULATORY_CARE_PROVIDER_SITE_OTHER): Admitting: Obstetrics and Gynecology

## 2024-07-23 VITALS — BP 130/76 | HR 107 | Wt 161.4 lb

## 2024-07-23 DIAGNOSIS — E2839 Other primary ovarian failure: Secondary | ICD-10-CM

## 2024-07-23 DIAGNOSIS — L280 Lichen simplex chronicus: Secondary | ICD-10-CM | POA: Diagnosis not present

## 2024-07-23 DIAGNOSIS — L900 Lichen sclerosus et atrophicus: Secondary | ICD-10-CM | POA: Diagnosis not present

## 2024-07-23 DIAGNOSIS — L299 Pruritus, unspecified: Secondary | ICD-10-CM | POA: Diagnosis not present

## 2024-07-23 DIAGNOSIS — N763 Subacute and chronic vulvitis: Secondary | ICD-10-CM | POA: Diagnosis not present

## 2024-07-23 MED ORDER — ESTRADIOL 0.1 MG/GM VA CREA: 1.0000 | TOPICAL_CREAM | Freq: Every day | VAGINAL | 2 refills | Status: AC

## 2024-07-23 NOTE — Progress Notes (Addendum)
 83 y.o. y.o. female here for pruritis.  Reports she has been using a steroid and two spots appear more irritated.  No LMP recorded. Patient has had a hysterectomy.   Has chronic vulvitis due lichen sclerosis.  Has a painful and itchy vulva.  Hx rectal and urinary incontinence. PT did not help.  Has tried Metamucil. Wearing pads frequently.   UTI 06/27/23 - E Coli and Klebsiella Oxytoca. Treated with Cephalexin .   PCP:   Debby Molt, MD  Patient reminded to schedule dxa scan and updated referral placed.  Body mass index is 26.05 kg/m.   Blood pressure 130/76, pulse (!) 107, weight 161 lb 6.4 oz (73.2 kg), SpO2 (!) 89%.  No results found for: DIAGPAP, HPVHIGH, ADEQPAP  GYN HISTORY: No results found for: DIAGPAP, HPVHIGH, ADEQPAP  OB History  Gravida Para Term Preterm AB Living  4 1   2 1   SAB IAB Ectopic Multiple Live Births  2        # Outcome Date GA Lbr Len/2nd Weight Sex Type Anes PTL Lv  4 Gravida           3 SAB           2 SAB           1 Para             Past Medical History:  Diagnosis Date   Arthritis    DDD (degenerative disc disease), lumbar 11/24/2018   Dizziness    Dyspnea    Elevated liver function tests    Gallstones    Hearing loss    History of stroke involving cerebellum 10/13/2018   Hypercholesteremia    Hypertension    Hypothyroidism    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   Migraines    Mild intermittent asthma without complication 02/11/2019   Primary osteoarthritis of both feet 11/24/2018   Proteinuria    Rheumatoid arthritis (HCC) 07/04/2018   Stage 3 chronic kidney disease (HCC) 02/11/2019   Vitamin D  deficiency 02/11/2019    Past Surgical History:  Procedure Laterality Date   ABDOMINAL HYSTERECTOMY     BREAST REDUCTION SURGERY Bilateral    CATARACT EXTRACTION, BILATERAL     CHOLECYSTECTOMY N/A 12/11/2021   Procedure: LAPAROSCOPIC CHOLECYSTECTOMY;  Surgeon: Aron Shoulders, MD;  Location: [facility];   Service: General;  Laterality: N/A;   ENDOSCOPIC RETROGRADE CHOLANGIOPANCREATOGRAPHY (ERCP) WITH PROPOFOL  N/A 12/09/2021   Procedure: ENDOSCOPIC RETROGRADE CHOLANGIOPANCREATOGRAPHY (ERCP) WITH PROPOFOL ;  Surgeon: Teressa Toribio SQUIBB, MD;  Location: [facility];  Service: Endoscopy;  Laterality: N/A;   INCISION AND DRAINAGE BREAST ABSCESS Left    REDUCTION MAMMAPLASTY     REMOVAL OF STONES  12/09/2021   Procedure: REMOVAL OF STONES;  Surgeon: Teressa Toribio SQUIBB, MD;  Location: [facility];  Service: Endoscopy;;   REPLACEMENT TOTAL KNEE Bilateral    SPHINCTEROTOMY  12/09/2021   Procedure: SPHINCTEROTOMY;  Surgeon: Teressa Toribio SQUIBB, MD;  Location: [facility];  Service: Endoscopy;;   TONSILLECTOMY AND ADENOIDECTOMY     TOOTH EXTRACTION  2023   x2    Current Outpatient Medications on File Prior to Visit  Medication Sig Dispense Refill   acetaminophen  (TYLENOL ) 500 MG tablet Take 2 tablets (1,000 mg total) by mouth 3 (three) times daily. 30 tablet 0   albuterol  (VENTOLIN  HFA) 108 (90 Base) MCG/ACT inhaler Inhale 2 puffs into the lungs every 6 (six) hours as needed for wheezing or shortness of breath.     allopurinol  (ZYLOPRIM ) 100  MG tablet TAKE 1 TABLET BY MOUTH TWICE A DAY 180 tablet 3   ASPIRIN  LOW DOSE 81 MG tablet TAKE 1 TABLET EVERY DAY (SWALLOW WHOLE) 90 tablet 3   levothyroxine  (SYNTHROID ) 50 MCG tablet Take 1 tablet (50 mcg total) by mouth daily. 90 tablet 0   metoprolol  succinate (TOPROL -XL) 25 MG 24 hr tablet TAKE 1 TABLET (25 MG TOTAL) BY MOUTH DAILY. 90 tablet 3   montelukast  (SINGULAIR ) 10 MG tablet TAKE 1 TABLET BY MOUTH EVERYDAY AT BEDTIME 90 tablet 0   Multiple Vitamin (TAB-A-VITE) TABS TAKE 1 TABLET EVERY DAY 90 tablet 3   rosuvastatin  (CRESTOR ) 10 MG tablet TAKE 1 TABLET EVERY DAY 90 tablet 3   terazosin  (HYTRIN ) 2 MG capsule TAKE 1 CAPSULE BY MOUTH AT BEDTIME. 90 capsule 3   tiZANidine  (ZANAFLEX ) 4 MG tablet TAKE 1 TABLET BY MOUTH AT BEDTIME AS NEEDED FOR MUSCLE SPASMS. 90  tablet 1   clonazePAM  (KLONOPIN ) 0.5 MG tablet Take 1 tablet (0.5 mg total) by mouth 2 (two) times daily as needed for anxiety. (Patient not taking: Reported on 07/23/2024) 180 tablet 0   eszopiclone  (LUNESTA ) 2 MG TABS tablet Take 1 tablet (2 mg total) by mouth at bedtime as needed for sleep. Take immediately before bedtime (Patient not taking: Reported on 07/23/2024) 90 tablet 0   No current facility-administered medications on file prior to visit.    Social History   Socioeconomic History   Marital status: Divorced    Spouse name: Not on file   Number of children: 1   Years of education: MD   Highest education level: Professional school degree (e.g., MD, DDS, DVM, JD)  Occupational History   Occupation: Retired - Sales promotion account executive    Comment: [employer]  Tobacco Use   Smoking status: Former    Current packs/day: 0.00    Average packs/day: 0.1 packs/day for 3.0 years (0.3 ttl pk-yrs)    Types: Cigarettes    Start date: 17    Quit date: 1970    Years since quitting: 55.6    Passive exposure: Never   Smokeless tobacco: Never  Vaping Use   Vaping status: Never Used  Substance and Sexual Activity   Alcohol use: Not Currently   Drug use: Never   Sexual activity: Not Currently    Comment: more than, after 14  Other Topics Concern   Not on file  Social History Narrative   Lives alone.  [facility]/The patient is widowed./ 2025   She worked at the [employer] in [place], MD scientist, and then moved to [place]  and moved to [place] where her son is a International aid/development worker, after her husband died   Right-handed.   1 cup coffee per day.   No alcohol   1 son as above   Former smoker   Social Drivers of Corporate investment banker Strain: Medium Risk (07/08/2024)   Overall Financial Resource Strain (CARDIA)    Difficulty of Paying Living Expenses: Somewhat hard  Food Insecurity: Food Insecurity Present (07/08/2024)   Hunger Vital Sign    Worried About Running Out of Food in  the Last Year: Sometimes true    Ran Out of Food in the Last Year: Sometimes true  Transportation Needs: No Transportation Needs (07/08/2024)   PRAPARE - Administrator, Civil Service (Medical): No    Lack of Transportation (Non-Medical): No  Physical Activity: Inactive (07/08/2024)   Exercise Vital Sign    Days of Exercise per Week: 0 days  Minutes of Exercise per Session: Not on file  Stress: Stress Concern Present (07/08/2024)   [employer] of Occupational Health - Occupational Stress Questionnaire    Feeling of Stress: To some extent  Social Connections: Socially Isolated (07/08/2024)   Social Connection and Isolation Panel    Frequency of Communication with Friends and Family: More than three times a week    Frequency of Social Gatherings with Friends and Family: Once a week    Attends Religious Services: Never    Database administrator or Organizations: No    Attends Engineer, structural: Not on file    Marital Status: Divorced  Intimate Partner Violence: Patient Unable To Answer (07/09/2024)   Humiliation, Afraid, Rape, and Kick questionnaire    Fear of Current or Ex-Partner: Patient unable to answer    Emotionally Abused: Patient unable to answer    Physically Abused: Patient unable to answer    Sexually Abused: Patient unable to answer    Family History  Problem Relation Age of Onset   Stroke Mother    Hyperlipidemia Mother    Arthritis Mother    Heart disease Father    Arthritis Sister    Heart disease Sister    Gallbladder disease Maternal Grandmother        radiation   GER disease Maternal Grandmother    Bipolar disorder Son    Healthy Son    Heart disease Paternal Uncle        x 7   Colon cancer Neg Hx    Esophageal cancer Neg Hx    Rectal cancer Neg Hx    Stomach cancer Neg Hx    Cancer Neg Hx      No Known Allergies    Patient's last menstrual period was No LMP recorded. Patient has had a hysterectomy..           Review  of Systems Alls systems reviewed and are negative.     Physical Exam Genitourinary:     Genitourinary Comments: Periclitoral red raised area Right hypopigmentation noted          A:         LS worsening pruritis                             P:        To begin daily estrogen cream Rtc for punch biopsies to rule out concerning pathology Patient encouraged to schedule bone density No follow-ups on file.  Alexis Shelton

## 2024-07-24 LAB — SURESWAB® ADVANCED VAGINITIS PLUS,TMA
C. trachomatis RNA, TMA: NOT DETECTED
CANDIDA SPECIES: NOT DETECTED
Candida glabrata: NOT DETECTED
N. gonorrhoeae RNA, TMA: NOT DETECTED
SURESWAB(R) ADV BACTERIAL VAGINOSIS(BV),TMA: NEGATIVE
TRICHOMONAS VAGINALIS (TV),TMA: NOT DETECTED

## 2024-07-24 MED ORDER — TERAZOSIN HCL 2 MG PO CAPS: 2.0000 mg | ORAL_CAPSULE | Freq: Every day | ORAL | 1 refills | Status: DC

## 2024-07-24 MED ORDER — METOPROLOL SUCCINATE ER 25 MG PO TB24: 25.0000 mg | ORAL_TABLET | Freq: Every day | ORAL | 1 refills | Status: DC

## 2024-07-24 NOTE — Telephone Encounter (Signed)
 Please advise. Patient is requesting a refill on Modafinil  for her sleeping but I do not see this medication on this patients medication list.

## 2024-07-24 NOTE — Telephone Encounter (Signed)
 Unable to reach patient. LMTRC

## 2024-07-25 ENCOUNTER — Ambulatory Visit (HOSPITAL_BASED_OUTPATIENT_CLINIC_OR_DEPARTMENT_OTHER): Payer: Self-pay | Admitting: Obstetrics and Gynecology

## 2024-07-27 NOTE — Telephone Encounter (Signed)
 Per review of EPIC, punch bx scheduled for 09/09/24

## 2024-07-29 ENCOUNTER — Ambulatory Visit: Admitting: Obstetrics and Gynecology

## 2024-07-30 ENCOUNTER — Encounter: Payer: Self-pay | Admitting: Internal Medicine

## 2024-08-06 DIAGNOSIS — L821 Other seborrheic keratosis: Secondary | ICD-10-CM | POA: Diagnosis not present

## 2024-08-06 DIAGNOSIS — D1801 Hemangioma of skin and subcutaneous tissue: Secondary | ICD-10-CM | POA: Diagnosis not present

## 2024-08-06 DIAGNOSIS — L853 Xerosis cutis: Secondary | ICD-10-CM | POA: Diagnosis not present

## 2024-08-06 DIAGNOSIS — L814 Other melanin hyperpigmentation: Secondary | ICD-10-CM | POA: Diagnosis not present

## 2024-08-10 ENCOUNTER — Ambulatory Visit: Admitting: Internal Medicine

## 2024-09-03 ENCOUNTER — Ambulatory Visit: Admitting: Internal Medicine

## 2024-09-03 ENCOUNTER — Encounter: Payer: Self-pay | Admitting: Internal Medicine

## 2024-09-03 ENCOUNTER — Ambulatory Visit (INDEPENDENT_AMBULATORY_CARE_PROVIDER_SITE_OTHER)

## 2024-09-03 ENCOUNTER — Ambulatory Visit: Payer: Self-pay | Admitting: Internal Medicine

## 2024-09-03 VITALS — BP 132/66 | HR 60 | Temp 98.3°F | Ht 66.0 in | Wt 162.4 lb

## 2024-09-03 DIAGNOSIS — E039 Hypothyroidism, unspecified: Secondary | ICD-10-CM

## 2024-09-03 DIAGNOSIS — I10 Essential (primary) hypertension: Secondary | ICD-10-CM | POA: Diagnosis not present

## 2024-09-03 DIAGNOSIS — M4802 Spinal stenosis, cervical region: Secondary | ICD-10-CM | POA: Diagnosis not present

## 2024-09-03 DIAGNOSIS — M542 Cervicalgia: Secondary | ICD-10-CM

## 2024-09-03 DIAGNOSIS — M50321 Other cervical disc degeneration at C4-C5 level: Secondary | ICD-10-CM | POA: Diagnosis not present

## 2024-09-03 NOTE — Patient Instructions (Signed)
 Cervical Sprain A cervical sprain is a stretch or tear in one or more of the ligaments in the neck. Ligaments are the tissues that connect bones to each other. Cervical sprains can range from mild to severe. Severe cervical sprains can cause the spinal bones (vertebrae) in the neck to be unstable. This can result in spinal cord damage and serious nervous system problems. Healing time for a cervical sprain depends on the cause and extent of the injury. Most cervical sprains heal in 4-6 weeks. What are the causes? Cervical sprains may be caused by trauma, such as an injury from a motor vehicle accident, a fall, or a sudden forward and backward whipping movement of the head and neck (whiplash injury). Mild cervical sprains may be caused by wear and tear over time. What increases the risk? You are more likely to get a cervical sprain if: You take part in activities that have a high risk of trauma to the neck. These include contact sports, gymnastics, and diving. You have: Osteoarthritis of the spine. Poor strength and flexibility of the neck. Poor posture. You have had a neck injury in the past. You spend long periods in positions that put stress on the neck, such as sitting at a computer. What are the signs or symptoms? Symptoms of this condition include: Any of these problems in the neck, shoulders, or upper back: Pain or tenderness. Stiffness. Swelling. A burning feeling. Sudden tightening of neck muscles (spasms). Limited ability to move the neck. Headache. Dizziness. Nausea or vomiting. Weakness, numbness, or tingling in a hand or an arm. Symptoms may develop right away after injury or may develop over a few days. In some cases, symptoms may go away with treatment and return (recur) over time. How is this diagnosed? This condition may be diagnosed based on: Your symptoms, medical history, and a physical exam. Any recent injuries or known neck problems that you have, such as arthritis  in the neck. Imaging tests, such as X-rays, an MRI, or a CT scan. How is this treated? This condition is treated by resting and icing the injured area and doing physical therapy exercises to improve movement and strength. Heat therapy may be used 2-3 days after the injury if there is no swelling. Depending on the severity of your condition, treatment may also include: Keeping your neck in place (immobilized) for periods of time. This may be done using: A cervical collar. This supports your chin and the back of your head. A cervical traction device. This is a sling that holds up your head. It removes weight and pressure from your neck. Medicines for pain or other symptoms. Surgery. This is rare. Follow these instructions at home: Medicines Take over-the-counter and prescription medicines only as told by your health care provider. Ask your provider if the medicine prescribed to you: Requires you to avoid driving or using machinery. Can cause constipation. You may need to take these actions to prevent or treat constipation: Drink enough fluid to keep your pee pale yellow. Take over-the-counter or prescription medicines. Eat foods that are high in fiber, such as beans, whole grains, and fresh fruits and vegetables. Limit foods that are high in fat and processed sugars, such as fried or sweet foods. If you have a cervical collar: Wear the collar as told by your provider. Do not remove it unless told. Ask before making any adjustments to your collar. If you have long hair, keep it outside of the collar. If you are allowed to remove the  collar for cleaning and bathing: Follow instructions about how to remove it safely. Clean it by hand with mild soap and water and air-dry it completely. If your collar has removable pads, remove them every 1-2 days and wash them by hand with soap and water. Let them air-dry completely before putting them back in the collar. Tell your provider if your skin under  the collar has irritation or sores. Managing pain, stiffness, and swelling     Use a cervical traction device as told. If told, put ice on the affected area. Put ice in a plastic bag. Place a towel between your skin and the bag. Leave the ice on for 20 minutes, 2-3 times a day. If told, apply heat to the affected area before you exercise or as often as told by your provider. Use the heat source that your provider recommends, such as a moist heat pack or a heating pad. Place a towel between your skin and the heat source. Leave the heat on for 20-30 minutes. If your skin turns bright red, remove the ice or heat right away to prevent skin damage. The risk of damage is higher if you cannot feel pain, heat, or cold. Activity Do not drive while wearing a cervical collar. If you do not have a cervical collar, ask if it is safe to drive while your neck heals. Do not lift anything that is heavier than 10 lb (4.5 kg) until your provider says that it is safe. Rest as told by your provider. Avoid positions and activities that make your symptoms worse. Do physical therapy exercises as told by your provider or physical therapist. Return to your normal activities as told by your provider. Ask your provider what activities are safe for you. General instructions Do not use any products that contain nicotine or tobacco. These products include cigarettes, chewing tobacco, and vaping devices, such as e-cigarettes. These can delay healing. If you need help quitting, ask your provider. Keep all follow-up visits. Your provider will monitor your injury and activity level. How is this prevented? To prevent a cervical sprain from happening again: Use and maintain good posture. Make any needed adjustments to your workstation to help you do this. Exercise regularly as told by your provider or physical therapist. Avoid risky activities that may cause a cervical sprain. Contact a health care provider if: You have  symptoms that get worse or do not get better after 2 weeks of treatment. You have new symptoms. Your pain gets worse or does not get better with medicine. You have sores or irritated skin on your neck from wearing your cervical collar. Get help right away if: You have severe pain. You develop numbness, tingling, or weakness in any part of your body. You cannot move a part of your body (you have paralysis). You have neck pain along with severe dizziness or headache. This information is not intended to replace advice given to you by your health care provider. Make sure you discuss any questions you have with your health care provider. Document Revised: 07/13/2022 Document Reviewed: 07/13/2022 Elsevier Patient Education  2024 [employer].

## 2024-09-03 NOTE — Progress Notes (Signed)
 Subjective:  Patient ID: Alexis Shelton, female    DOB: 1941/05/01  Age: 83 y.o. MRN: [id number]  CC: Medical Management of Chronic Issues (4 month follow up ), Fall (Patient had a fall 2 months ago and she broke 4 ribs. Patient states that she has been feeling fine since she had the fall. ), Hypertension, Osteoarthritis, and Hypothyroidism   HPI Alexis Shelton presents for f/up ----  Discussed the use of AI scribe software for clinical note transcription with the patient, who gave verbal consent to proceed.  History of Present Illness Alexis Shelton is an 83 year old female who presents with neck pain and dizziness following a fall two months ago.  She has been experiencing persistent neck pain and irritation at the base of her skull since a fall two months ago, during which she sustained four rib fractures. The rib pain has resolved, but the neck pain persists without radiation to her arms. No numbness, weakness, or tingling in her arms or legs. She has not undergone any imaging studies of her neck.  She experiences dizziness, particularly when standing, which she attributes to fluid shifts. This dizziness has been a persistent issue. No swelling in her feet.  She experiences sacroiliac pain, especially in the morning, which improves with activity throughout the day. She resides in an independent care facility with long corridors, which she finds beneficial for staying active.  She denies frequent urination but notes that her urine was previously hyper-concentrated, which she manages by increasing her water intake. No dysuria, but she mentions occasional itching, which she attributes to pad use. She is scheduled for a vascular biopsy on the seventeenth due to some irritation.  No cough, wheezing, or swelling in her feet.     Outpatient Medications Prior to Visit  Medication Sig Dispense Refill   acetaminophen  (TYLENOL ) 500 MG tablet Take 2 tablets (1,000 mg total) by  mouth 3 (three) times daily. 30 tablet 0   albuterol  (VENTOLIN  HFA) 108 (90 Base) MCG/ACT inhaler Inhale 2 puffs into the lungs every 6 (six) hours as needed for wheezing or shortness of breath.     allopurinol  (ZYLOPRIM ) 100 MG tablet TAKE 1 TABLET BY MOUTH TWICE A DAY 180 tablet 3   ASPIRIN  LOW DOSE 81 MG tablet TAKE 1 TABLET EVERY DAY (SWALLOW WHOLE) 90 tablet 3   estradiol  (ESTRACE  VAGINAL) 0.1 MG/GM vaginal cream Place 1 Applicatorful vaginally at bedtime. Rub a dime size amount to the affected area nighly until seen 42.5 g 2   eszopiclone  (LUNESTA ) 2 MG TABS tablet Take 1 tablet (2 mg total) by mouth at bedtime as needed for sleep. Take immediately before bedtime 90 tablet 0   levothyroxine  (SYNTHROID ) 50 MCG tablet Take 1 tablet (50 mcg total) by mouth daily. 90 tablet 0   metoprolol  succinate (TOPROL -XL) 25 MG 24 hr tablet Take 1 tablet (25 mg total) by mouth daily. 90 tablet 1   montelukast  (SINGULAIR ) 10 MG tablet TAKE 1 TABLET BY MOUTH EVERYDAY AT BEDTIME 90 tablet 0   Multiple Vitamin (TAB-A-VITE) TABS TAKE 1 TABLET EVERY DAY 90 tablet 3   rosuvastatin  (CRESTOR ) 10 MG tablet TAKE 1 TABLET EVERY DAY 90 tablet 3   terazosin  (HYTRIN ) 2 MG capsule Take 1 capsule (2 mg total) by mouth at bedtime. 90 capsule 1   tiZANidine  (ZANAFLEX ) 4 MG tablet TAKE 1 TABLET BY MOUTH AT BEDTIME AS NEEDED FOR MUSCLE SPASMS. 90 tablet 1   clonazePAM  (KLONOPIN ) 0.5 MG tablet Take  1 tablet (0.5 mg total) by mouth 2 (two) times daily as needed for anxiety. (Patient not taking: Reported on 07/23/2024) 180 tablet 0   No facility-administered medications prior to visit.    ROS Review of Systems  Objective:  BP 132/66 (BP Location: Left Arm, Patient Position: Sitting, Cuff Size: Normal)   Pulse 60   Temp 98.3 F (36.8 C) (Oral)   Ht 5' 6 (1.676 m)   Wt 162 lb 6.4 oz (73.7 kg)   SpO2 94%   BMI 26.21 kg/m   BP Readings from Last 3 Encounters:  09/03/24 132/66  07/23/24 130/76  06/03/24 (!) 142/68     Wt Readings from Last 3 Encounters:  09/03/24 162 lb 6.4 oz (73.7 kg)  07/23/24 161 lb 6.4 oz (73.2 kg)  07/09/24 168 lb (76.2 kg)    Physical Exam  Lab Results  Component Value Date   WBC 5.9 05/11/2024   HGB 13.5 05/11/2024   HCT 43.2 05/11/2024   PLT 218 05/11/2024   GLUCOSE 89 06/03/2024   CHOL 194 08/07/2023   TRIG 125.0 08/07/2023   HDL 51.40 08/07/2023   LDLDIRECT 175.0 06/06/2022   LDLCALC 117 (H) 08/07/2023   ALT 40 03/13/2024   AST 42 (H) 03/13/2024   NA 140 06/03/2024   K 4.9 06/03/2024   CL 104 06/03/2024   CREATININE 1.36 (H) 06/03/2024   BUN 20 06/03/2024   CO2 29 06/03/2024   TSH 0.21 (L) 06/03/2024   INR 1.0 03/14/2024   HGBA1C 5.9 02/24/2024   MICROALBUR 44.7 (H) 02/24/2024    DG Chest 2 View Result Date: 05/11/2024 CLINICAL DATA:  Fall, left rib and back pain, shoulder pain EXAM: CHEST - 2 VIEW COMPARISON:  03/13/2024 FINDINGS: There are acute anterolateral left third through sixth rib fractures, better demonstrated on the comparison shoulder x-ray from today. Left basilar atelectasis versus mild airspace disease noted with blunting of left costophrenic angle compatible with a small left effusion. No pneumothorax appreciated. No chest wall subcutaneous emphysema. Right lung remains clear. Mild cardiomegaly without CHF. Aorta is sclerotic. Trachea midline. Degenerative changes of the spine and shoulders. IMPRESSION: 1. Acute left third through sixth rib fractures. 2. Small left pleural effusion with left basilar atelectasis versus airspace disease. 3. No pneumothorax. Electronically Signed   By: CHRISTELLA.  Shick M.D.   On: 05/11/2024 15:09   DG Shoulder Left Result Date: 05/11/2024 CLINICAL DATA:  Recent fall, left shoulder pain EXAM: LEFT SHOULDER - 2+ VIEW COMPARISON:  03/13/2024 FINDINGS: The bones are osteopenic. Similar degenerative arthropathy of the left AC joint and glenohumeral joint with joint space loss, sclerosis and bony spurring. No acute osseous  finding, fracture, or joint malalignment. No subluxation or dislocation. AC joint also non without separation. Included left chest demonstrates no acute displaced rib fractures of the left lateral third through sixth ribs. IMPRESSION: 1. Osteopenia and degenerative changes as above. 2. No acute finding involving the left shoulder by plain radiography. 3. Acute displaced left lateral third through sixth rib fractures. Electronically Signed   By: CHRISTELLA.  Shick M.D.   On: 05/11/2024 15:07    DG Cervical Spine Complete Result Date: 09/03/2024 CLINICAL DATA:  Neck pain after fall 2 months ago. EXAM: CERVICAL SPINE - COMPLETE 4+ VIEW COMPARISON:  None Available. FINDINGS: No fracture or spondylolisthesis is noted. Moderate degenerative disc disease is noted at C4-5, C5-6 and C6-7. No prevertebral soft tissue swelling is noted. Mild bilateral neural foraminal stenosis is noted extending from C3-4 to C6-7 secondary  to uncovertebral spurring. IMPRESSION: Multilevel degenerative disc disease as described above. No acute abnormality seen. Electronically Signed   By: Lynwood Landy Raddle M.D.   On: 09/03/2024 14:16     Assessment & Plan:  Essential hypertension  Posterior neck pain -     DG Cervical Spine Complete; Future     Follow-up: Return in about 3 months (around 12/03/2024).  Debby Molt, MD

## 2024-09-04 ENCOUNTER — Other Ambulatory Visit: Payer: Self-pay | Admitting: Medical Genetics

## 2024-09-09 ENCOUNTER — Encounter: Payer: Self-pay | Admitting: Obstetrics and Gynecology

## 2024-09-09 ENCOUNTER — Other Ambulatory Visit (HOSPITAL_COMMUNITY)
Admission: RE | Admit: 2024-09-09 | Discharge: 2024-09-09 | Disposition: A | Discharge status: Home / Self Care | Source: Ambulatory Visit | Attending: Obstetrics and Gynecology | Admitting: Obstetrics and Gynecology

## 2024-09-09 ENCOUNTER — Ambulatory Visit (INDEPENDENT_AMBULATORY_CARE_PROVIDER_SITE_OTHER): Admitting: Obstetrics and Gynecology

## 2024-09-09 VITALS — BP 132/66 | HR 64 | Wt 162.0 lb

## 2024-09-09 DIAGNOSIS — E2839 Other primary ovarian failure: Secondary | ICD-10-CM | POA: Diagnosis not present

## 2024-09-09 DIAGNOSIS — L280 Lichen simplex chronicus: Secondary | ICD-10-CM

## 2024-09-09 DIAGNOSIS — N763 Subacute and chronic vulvitis: Secondary | ICD-10-CM | POA: Diagnosis not present

## 2024-09-10 ENCOUNTER — Other Ambulatory Visit

## 2024-09-10 DIAGNOSIS — Z006 Encounter for examination for normal comparison and control in clinical research program: Secondary | ICD-10-CM

## 2024-09-10 NOTE — Progress Notes (Signed)
   Acute Office Visit  Subjective:    Patient ID: Alexis Shelton, female    DOB: 01-Sep-1941, 83 y.o.   MRN: [id number]   HPI 83 y.o. presents today for Procedure (Vulvar Biopsy) . Patient with LS and worsening pruritus and hypopigmented areas seen on last exam. States the estrogen cream is helping. She is here today for punch biopsy No LMP recorded. Patient has had a hysterectomy.    Review of Systems     Objective:    OBGyn Exam  BP 132/66 (BP Location: Right Arm, Patient Position: Sitting, Cuff Size: Normal)   Pulse 64   Wt 162 lb (73.5 kg)   SpO2 95%   BMI 26.15 kg/m  Wt Readings from Last 3 Encounters:  09/09/24 162 lb (73.5 kg)  09/03/24 162 lb 6.4 oz (73.7 kg)  07/23/24 161 lb 6.4 oz (73.2 kg)   Time out performed   Procedure: Punch biopsy Patient consented for the procedure with written consent.  The area was cleaned with betadine.  1 cc of lidocaine  with epi was injected subcutaneously.  A 3 mm punch biopsy was used and the biopsy was then lifted and cut with the scissors and sent to pathology. This was done at the upper left labia and on the right labia majora. Hemostasis was achieved with silver nitrate.  Patient tolerated the procedure well.  Post care instructions were discussed with patient.     Gerrie, CMA was present for the procedure   Assessment & Plan:  LS with worsening pruritus improved with estrogen cream  Continue estrace  May apply neosporin and or aloe to the biopsy sites. RTC with any concerns To notify patient of the results.  Dr. Glennon Norris MARLA Glennon

## 2024-09-14 ENCOUNTER — Ambulatory Visit: Payer: Self-pay | Admitting: Obstetrics and Gynecology

## 2024-09-14 LAB — SURGICAL PATHOLOGY

## 2024-09-15 NOTE — Telephone Encounter (Signed)
 Pt has read the mychart message of your recommendation.   I have LM Asking pt to call our office to let us  know if she needs us  to place a referral to dermatology if she doesn't currently see one.

## 2024-09-16 ENCOUNTER — Other Ambulatory Visit: Payer: Self-pay | Admitting: Internal Medicine

## 2024-09-16 DIAGNOSIS — F5104 Psychophysiologic insomnia: Secondary | ICD-10-CM

## 2024-09-16 DIAGNOSIS — I10 Essential (primary) hypertension: Secondary | ICD-10-CM

## 2024-09-16 NOTE — Telephone Encounter (Signed)
 Copied from CRM #[id number]. Topic: Clinical - Medication Refill >> Sep 16, 2024  4:25 PM Tysheama G wrote: Medication: metoprolol  succinate (TOPROL -XL) 25 MG 24 hr tablet  Has the patient contacted their pharmacy? No (Agent: If no, request that the patient contact the pharmacy for the refill. If patient does not wish to contact the pharmacy document the reason why and proceed with request.) (Agent: If yes, when and what did the pharmacy advise?)  This is the patient's preferred pharmacy:  [id number] - [place], [place] - [address] AT [address] OF [facility] [address] [place] [address] Phone: [phone] Fax: [phone]    Is this the correct pharmacy for this prescription? Yes If no, delete pharmacy and type the correct one.   Has the prescription been filled recently? No  Is the patient out of the medication? Yes  Has the patient been seen for an appointment in the last year OR does the patient have an upcoming appointment? Yes  Can we respond through MyChart? Yes  Agent: Please be advised that Rx refills may take up to 3 business days. We ask that you follow-up with your pharmacy.

## 2024-09-16 NOTE — Telephone Encounter (Signed)
 Patient reports she lost this medication and is requesting refill as soon as possible, thank you. Already pended.

## 2024-09-17 ENCOUNTER — Telehealth: Payer: Self-pay

## 2024-09-17 NOTE — Telephone Encounter (Signed)
 Copied from CRM #[id number]. Topic: Clinical - Prescription Issue >> Sep 16, 2024  4:27 PM Alexis Shelton wrote: Reason for CRM: Patient lost the medication and needs refill metoprolol  succinate (TOPROL -XL) 25 MG 24 hr tablet, ASAP. She needs her medication today!! >> Sep 17, 2024 11:14 AM Alexis Shelton wrote: Patient returning call to check status of refill request for her metoprolol  succinate (TOPROL -XL) 25 MG 24 hr tablet refill shows pending as of 9/24 and advised patient, patient states she's out and has been out for a couple days and needs her medication  Remas [phone]

## 2024-09-18 ENCOUNTER — Other Ambulatory Visit: Payer: Self-pay

## 2024-09-18 NOTE — Telephone Encounter (Signed)
 Unable to reach patient. LMTRC. Patient has a 6 month supply at her pharmacy and should reach out to them for them to give her her medication.

## 2024-09-21 DIAGNOSIS — N1832 Chronic kidney disease, stage 3b: Secondary | ICD-10-CM | POA: Diagnosis not present

## 2024-09-24 NOTE — Telephone Encounter (Signed)
 Error

## 2024-09-25 ENCOUNTER — Other Ambulatory Visit: Payer: Self-pay | Admitting: Internal Medicine

## 2024-09-25 DIAGNOSIS — F5104 Psychophysiologic insomnia: Secondary | ICD-10-CM

## 2024-09-25 LAB — GENECONNECT MOLECULAR SCREEN: Genetic Analysis Overall Interpretation: NEGATIVE

## 2024-09-25 MED ORDER — METOPROLOL SUCCINATE ER 25 MG PO TB24: 25.0000 mg | ORAL_TABLET | Freq: Every day | ORAL | 1 refills | Status: DC

## 2024-09-25 MED ORDER — ESZOPICLONE 2 MG PO TABS: 2.0000 mg | ORAL_TABLET | Freq: Every evening | ORAL | 0 refills | Status: AC | PRN

## 2024-09-25 NOTE — Telephone Encounter (Signed)
 Unable to reach patient. LMTRC. Closing the encounter because her medication is at the pharmacy

## 2024-09-29 ENCOUNTER — Ambulatory Visit

## 2024-10-02 ENCOUNTER — Ambulatory Visit

## 2024-10-13 ENCOUNTER — Ambulatory Visit
Admission: RE | Admit: 2024-10-13 | Discharge: 2024-10-13 | Disposition: A | Discharge status: Home / Self Care | Source: Ambulatory Visit | Attending: Internal Medicine | Admitting: Internal Medicine

## 2024-10-13 DIAGNOSIS — Z1231 Encounter for screening mammogram for malignant neoplasm of breast: Secondary | ICD-10-CM | POA: Diagnosis not present

## 2024-10-19 DIAGNOSIS — N1832 Chronic kidney disease, stage 3b: Secondary | ICD-10-CM | POA: Diagnosis not present

## 2024-10-19 DIAGNOSIS — I129 Hypertensive chronic kidney disease with stage 1 through stage 4 chronic kidney disease, or unspecified chronic kidney disease: Secondary | ICD-10-CM | POA: Diagnosis not present

## 2024-10-23 ENCOUNTER — Other Ambulatory Visit: Payer: Self-pay | Admitting: Internal Medicine

## 2024-10-23 ENCOUNTER — Telehealth: Payer: Self-pay | Admitting: Internal Medicine

## 2024-10-23 DIAGNOSIS — R400 Somnolence: Secondary | ICD-10-CM

## 2024-10-23 NOTE — Telephone Encounter (Signed)
 Copied from CRM [id number]. Topic: Clinical - Medication Question >> Oct 23, 2024  3:26 PM Robinson H wrote: Reason for CRM: Wants to speak with provider about changing medications read that they can lead to problems with memory and having a lot of issues with memory, states she is older but thinks medication is leading to memory loss. Patient states she read that medication causes Dementia metoprolol  succinate (TOPROL -XL) 25 MG 24 hr tablet and rosuvastatin  (CRESTOR ) 10 MG tablet wants something different if possible. States she's discussed this with Dr. Joshua in the past, will come in if she has to.  [place] [phone]

## 2024-10-26 ENCOUNTER — Encounter: Payer: Self-pay | Admitting: Radiology

## 2024-10-29 NOTE — Telephone Encounter (Signed)
**Note De-identified  [place] Obfuscation** Please advise 

## 2024-11-04 ENCOUNTER — Telehealth: Payer: Self-pay

## 2024-11-04 NOTE — Telephone Encounter (Signed)
 Copied from CRM #[id number]. Topic: Appointments - Scheduling Inquiry for Clinic >> Nov 04, 2024 12:16 PM Frederich PARAS wrote: Reason for CRM: pt called bone density apt for April. I did not see the scheduled aput.pt says she got reminders on mychart, I did not see any appts . Please reach out to pt with clarification

## 2024-11-06 NOTE — Telephone Encounter (Signed)
 Called patient and informed her that she is due for a bone density and that if she would like to have one done then she would need to call the office back and let is know so we can let Dr joshua know

## 2024-11-06 NOTE — Telephone Encounter (Signed)
 Patient has called back regarding this. States it has been two weeks since she inquired about this and would like an answer as soon as possible. Call back [phone].

## 2024-11-06 NOTE — Telephone Encounter (Signed)
 Patient states that she has a bone density scheduled for April but she is not sure where it is scheduled.

## 2024-11-09 ENCOUNTER — Telehealth: Payer: Self-pay

## 2024-11-09 NOTE — Telephone Encounter (Signed)
 Copied from CRM #[id number]. Topic: General - Other >> Nov 09, 2024  1:11 PM Macario HERO wrote: Reason for CRM: Patient called regarding missed call from [facility], called [facility] and was advised will return call.

## 2024-11-09 NOTE — Telephone Encounter (Signed)
 Unable to reach patient [facility]. Patient needs to be seen in office to discuss these issues.

## 2024-11-09 NOTE — Telephone Encounter (Signed)
 I don't think these meds are affecting her memory

## 2024-11-09 NOTE — Telephone Encounter (Signed)
 Unable to reach patient. LMTRC

## 2024-11-09 NOTE — Telephone Encounter (Signed)
 Please advise

## 2024-11-10 NOTE — Telephone Encounter (Unsigned)
 Copied from CRM [id number]. Topic: General - Call Back - No Documentation >> Nov 10, 2024  2:26 PM Harlene ORN wrote: Reason for CRM: Patient is returning call from the practice.

## 2024-11-13 NOTE — Telephone Encounter (Signed)
 Unable to reach patient. LMTRC

## 2024-11-16 NOTE — Telephone Encounter (Signed)
 Unable to reach patient. LMTRC this is actually my 3rd attempt to reach the patient. (Please see other telephone encounter). I will close this call out and if the patient calls back you can reopen this call.

## 2024-11-16 NOTE — Telephone Encounter (Signed)
 Unable to reach patient. LMTRC this is my 3rd and final attempt to reach the patient. I will close this encounter, if the patient calls back this call can be reopened.

## 2024-11-26 ENCOUNTER — Ambulatory Visit: Admitting: Internal Medicine

## 2024-12-09 ENCOUNTER — Other Ambulatory Visit: Payer: Self-pay | Admitting: Internal Medicine

## 2024-12-09 DIAGNOSIS — E039 Hypothyroidism, unspecified: Secondary | ICD-10-CM

## 2024-12-10 ENCOUNTER — Other Ambulatory Visit: Payer: Self-pay | Admitting: Internal Medicine

## 2024-12-23 ENCOUNTER — Telehealth: Payer: Self-pay

## 2024-12-23 NOTE — Telephone Encounter (Unsigned)
 Copied from CRM #[id number]. Topic: Clinical - Medication Question >> Dec 23, 2024  4:51 PM China J wrote: Reason for CRM: [facility] calling to get an update on a request they sent for ozempic . The pharmacy needs to know whether they should discard the request or keep it.   Please call the pharmacy at [phone] for an update.

## 2024-12-25 NOTE — Telephone Encounter (Signed)
 Unable to reach patient. LMTRC. Was calling to verify if the patient is currently taking this medication or not as it is not listened on her medication list.

## 2024-12-30 ENCOUNTER — Encounter: Payer: Self-pay | Admitting: Internal Medicine

## 2024-12-30 ENCOUNTER — Ambulatory Visit

## 2024-12-30 ENCOUNTER — Ambulatory Visit: Payer: Self-pay | Admitting: Internal Medicine

## 2024-12-30 ENCOUNTER — Ambulatory Visit (INDEPENDENT_AMBULATORY_CARE_PROVIDER_SITE_OTHER): Admitting: Obstetrics and Gynecology

## 2024-12-30 ENCOUNTER — Ambulatory Visit: Admitting: Internal Medicine

## 2024-12-30 ENCOUNTER — Encounter: Payer: Self-pay | Admitting: Obstetrics and Gynecology

## 2024-12-30 VITALS — BP 128/78 | HR 80 | Ht 66.0 in | Wt 162.0 lb

## 2024-12-30 VITALS — BP 148/68 | HR 66 | Temp 98.8°F | Resp 16 | Ht 66.0 in | Wt 162.2 lb

## 2024-12-30 DIAGNOSIS — N1832 Chronic kidney disease, stage 3b: Secondary | ICD-10-CM

## 2024-12-30 DIAGNOSIS — L280 Lichen simplex chronicus: Secondary | ICD-10-CM

## 2024-12-30 DIAGNOSIS — E039 Hypothyroidism, unspecified: Secondary | ICD-10-CM | POA: Diagnosis not present

## 2024-12-30 DIAGNOSIS — M1A09X0 Idiopathic chronic gout, multiple sites, without tophus (tophi): Secondary | ICD-10-CM

## 2024-12-30 DIAGNOSIS — L299 Pruritus, unspecified: Secondary | ICD-10-CM

## 2024-12-30 DIAGNOSIS — R052 Subacute cough: Secondary | ICD-10-CM

## 2024-12-30 DIAGNOSIS — E785 Hyperlipidemia, unspecified: Secondary | ICD-10-CM | POA: Diagnosis not present

## 2024-12-30 DIAGNOSIS — G8929 Other chronic pain: Secondary | ICD-10-CM

## 2024-12-30 DIAGNOSIS — M19071 Primary osteoarthritis, right ankle and foot: Secondary | ICD-10-CM

## 2024-12-30 DIAGNOSIS — M5030 Other cervical disc degeneration, unspecified cervical region: Secondary | ICD-10-CM | POA: Diagnosis not present

## 2024-12-30 DIAGNOSIS — G119 Hereditary ataxia, unspecified: Secondary | ICD-10-CM

## 2024-12-30 DIAGNOSIS — M159 Polyosteoarthritis, unspecified: Secondary | ICD-10-CM | POA: Diagnosis not present

## 2024-12-30 DIAGNOSIS — M109 Gout, unspecified: Secondary | ICD-10-CM

## 2024-12-30 DIAGNOSIS — R7303 Prediabetes: Secondary | ICD-10-CM

## 2024-12-30 DIAGNOSIS — M19041 Primary osteoarthritis, right hand: Secondary | ICD-10-CM

## 2024-12-30 DIAGNOSIS — M170 Bilateral primary osteoarthritis of knee: Secondary | ICD-10-CM

## 2024-12-30 DIAGNOSIS — I10 Essential (primary) hypertension: Secondary | ICD-10-CM | POA: Diagnosis not present

## 2024-12-30 DIAGNOSIS — R159 Full incontinence of feces: Secondary | ICD-10-CM | POA: Diagnosis not present

## 2024-12-30 DIAGNOSIS — M5136 Other intervertebral disc degeneration, lumbar region with discogenic back pain only: Secondary | ICD-10-CM

## 2024-12-30 LAB — BASIC METABOLIC PANEL WITH GFR
BUN: 25 mg/dL — ABNORMAL HIGH (ref 6–23)
CO2: 29 meq/L (ref 19–32)
Calcium: 9.5 mg/dL (ref 8.4–10.5)
Chloride: 105 meq/L (ref 96–112)
Creatinine, Ser: 1.46 mg/dL — ABNORMAL HIGH (ref 0.40–1.20)
GFR: 32.66 mL/min — ABNORMAL LOW
Glucose, Bld: 98 mg/dL (ref 70–99)
Potassium: 4.1 meq/L (ref 3.5–5.1)
Sodium: 140 meq/L (ref 135–145)

## 2024-12-30 LAB — CBC WITH DIFFERENTIAL/PLATELET
Basophils Absolute: 0 K/uL (ref 0.0–0.1)
Basophils Relative: 0.5 % (ref 0.0–3.0)
Eosinophils Absolute: 0.2 K/uL (ref 0.0–0.7)
Eosinophils Relative: 3 % (ref 0.0–5.0)
HCT: 42.4 % (ref 36.0–46.0)
Hemoglobin: 13.6 g/dL (ref 12.0–15.0)
Lymphocytes Relative: 19.5 % (ref 12.0–46.0)
Lymphs Abs: 1.1 K/uL (ref 0.7–4.0)
MCHC: 32 g/dL (ref 30.0–36.0)
MCV: 95 fl (ref 78.0–100.0)
Monocytes Absolute: 0.5 K/uL (ref 0.1–1.0)
Monocytes Relative: 8.8 % (ref 3.0–12.0)
Neutro Abs: 3.9 K/uL (ref 1.4–7.7)
Neutrophils Relative %: 68.2 % (ref 43.0–77.0)
Platelets: 219 K/uL (ref 150.0–400.0)
RBC: 4.47 Mil/uL (ref 3.87–5.11)
RDW: 16.6 % — ABNORMAL HIGH (ref 11.5–15.5)
WBC: 5.7 K/uL (ref 4.0–10.5)

## 2024-12-30 LAB — HEPATIC FUNCTION PANEL
ALT: 18 U/L (ref 3–35)
AST: 21 U/L (ref 5–37)
Albumin: 4 g/dL (ref 3.5–5.2)
Alkaline Phosphatase: 76 U/L (ref 39–117)
Bilirubin, Direct: 0 mg/dL — ABNORMAL LOW (ref 0.1–0.3)
Total Bilirubin: 0.3 mg/dL (ref 0.2–1.2)
Total Protein: 7 g/dL (ref 6.0–8.3)

## 2024-12-30 LAB — TSH: TSH: 0.91 u[IU]/mL (ref 0.35–5.50)

## 2024-12-30 LAB — HEMOGLOBIN A1C: Hgb A1c MFr Bld: 6 % (ref 4.6–6.5)

## 2024-12-30 LAB — CK: Total CK: 72 U/L (ref 17–177)

## 2024-12-30 LAB — URIC ACID: Uric Acid, Serum: 7.4 mg/dL — ABNORMAL HIGH (ref 2.4–7.0)

## 2024-12-30 MED ORDER — ALLOPURINOL 100 MG PO TABS: 100.0000 mg | ORAL_TABLET | Freq: Every day | ORAL | 1 refills | Status: AC

## 2024-12-30 MED ORDER — TRAMADOL HCL 50 MG PO TABS: 50.0000 mg | ORAL_TABLET | Freq: Every day | ORAL | 1 refills | Status: AC | PRN

## 2024-12-30 MED ORDER — CLOBETASOL PROPIONATE 0.05 % EX OINT: 1.0000 | TOPICAL_OINTMENT | CUTANEOUS | 1 refills | Status: AC

## 2024-12-30 NOTE — Patient Instructions (Signed)
 Apply a pea size amount of clobetasol (a potent steroid cream) to area with itching 3 times a day for a week then twice a day for 2 weeks then daily for 1 week then twice weekly for a week with flares of vulvar itching use with daily estrogen cream a pea size amount

## 2024-12-30 NOTE — Progress Notes (Signed)
" ° °  Acute Office Visit  Subjective:    Patient ID: Alexis Shelton, female    DOB: [birth year], 84 y.o.   MRN: [id number]   HPI 84 y.o. presents today for Procedure (Biopsy vulva) . Not here today for biopsy. Has done vulvar biopsy and shows LS Has flares of vaginal pruritus Using occasional vaginal estrogen Also with h/o eczema No LMP recorded. Patient has had a hysterectomy.    Review of Systems     Objective:    OBGyn Exam  BP 128/78 (BP Location: Left Arm, Patient Position: Sitting, Cuff Size: Normal)   Pulse 80   Ht 5' 6 (1.676 m)   Wt 162 lb (73.5 kg) Comment: Pt refused / requested to enter previous weight  SpO2 98%   BMI 26.15 kg/m  Wt Readings from Last 3 Encounters:  12/30/24 162 lb 3.2 oz (73.6 kg)  12/30/24 162 lb (73.5 kg)  09/09/24 162 lb (73.5 kg)        Assessment & Plan:  LS results  Counseled on LS and results. Repeat biopsy with any worsening or severe symptoms due to risk of cancer with it, although small risk. Can do steroid cream with flares but with estrogen at the same time. Referral placed to dermatology as well to follow and manage and make any recommendations. Patient also noted fecal incontinence and wears a daily pad. Referral placed to urogyn for consultation. 30 minutes spent on reviewing records, imaging,  and one on one patient time and counseling patient and documentation Dr. Glennon Almarie MARLA Glennon "

## 2024-12-30 NOTE — Telephone Encounter (Signed)
Discuss at her appointment today
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
## 2024-12-30 NOTE — Patient Instructions (Signed)

## 2025-01-04 ENCOUNTER — Other Ambulatory Visit: Payer: Self-pay | Admitting: Internal Medicine

## 2025-01-04 DIAGNOSIS — I10 Essential (primary) hypertension: Secondary | ICD-10-CM

## 2025-06-30 ENCOUNTER — Ambulatory Visit: Admitting: Internal Medicine

## 2025-07-13 ENCOUNTER — Ambulatory Visit
